# Patient Record
Sex: Male | Born: 1965 | Race: White | Hispanic: No | Marital: Married | Smoking: Never smoker
Health system: Southern US, Community
[De-identification: ages and names within clinical notes are randomized; demographics above are authoritative.]

## PROBLEM LIST (undated history)

## (undated) DIAGNOSIS — S4991XA Unspecified injury of right shoulder and upper arm, initial encounter: Secondary | ICD-10-CM

## (undated) DIAGNOSIS — T8859XA Other complications of anesthesia, initial encounter: Secondary | ICD-10-CM

## (undated) DIAGNOSIS — C801 Malignant (primary) neoplasm, unspecified: Secondary | ICD-10-CM

## (undated) DIAGNOSIS — K859 Acute pancreatitis without necrosis or infection, unspecified: Secondary | ICD-10-CM

## (undated) DIAGNOSIS — Z87442 Personal history of urinary calculi: Secondary | ICD-10-CM

## (undated) DIAGNOSIS — R058 Other specified cough: Secondary | ICD-10-CM

## (undated) DIAGNOSIS — E119 Type 2 diabetes mellitus without complications: Secondary | ICD-10-CM

## (undated) DIAGNOSIS — I2510 Atherosclerotic heart disease of native coronary artery without angina pectoris: Secondary | ICD-10-CM

## (undated) DIAGNOSIS — K219 Gastro-esophageal reflux disease without esophagitis: Secondary | ICD-10-CM

## (undated) DIAGNOSIS — N189 Chronic kidney disease, unspecified: Secondary | ICD-10-CM

## (undated) DIAGNOSIS — N529 Male erectile dysfunction, unspecified: Secondary | ICD-10-CM

## (undated) DIAGNOSIS — I10 Essential (primary) hypertension: Secondary | ICD-10-CM

## (undated) DIAGNOSIS — T4145XA Adverse effect of unspecified anesthetic, initial encounter: Secondary | ICD-10-CM

## (undated) DIAGNOSIS — G4730 Sleep apnea, unspecified: Secondary | ICD-10-CM

## (undated) DIAGNOSIS — D649 Anemia, unspecified: Secondary | ICD-10-CM

## (undated) DIAGNOSIS — K649 Unspecified hemorrhoids: Secondary | ICD-10-CM

## (undated) DIAGNOSIS — J45909 Unspecified asthma, uncomplicated: Secondary | ICD-10-CM

## (undated) DIAGNOSIS — N200 Calculus of kidney: Secondary | ICD-10-CM

## (undated) DIAGNOSIS — K760 Fatty (change of) liver, not elsewhere classified: Secondary | ICD-10-CM

## (undated) DIAGNOSIS — R05 Cough: Secondary | ICD-10-CM

## (undated) DIAGNOSIS — Z974 Presence of external hearing-aid: Secondary | ICD-10-CM

## (undated) DIAGNOSIS — K2270 Barrett's esophagus without dysplasia: Secondary | ICD-10-CM

## (undated) DIAGNOSIS — E785 Hyperlipidemia, unspecified: Secondary | ICD-10-CM

## (undated) DIAGNOSIS — K5790 Diverticulosis of intestine, part unspecified, without perforation or abscess without bleeding: Secondary | ICD-10-CM

## (undated) DIAGNOSIS — M1990 Unspecified osteoarthritis, unspecified site: Secondary | ICD-10-CM

## (undated) HISTORY — PX: COLONOSCOPY: SHX174

## (undated) HISTORY — PX: ANKLE GANGLION CYST EXCISION: SHX1148

## (undated) HISTORY — DX: Calculus of kidney: N20.0

---

## 1991-11-10 HISTORY — PX: PREPATELLAR BURSA EXCISION: SUR547

## 2000-11-09 HISTORY — PX: VASECTOMY: SHX75

## 2005-11-09 HISTORY — PX: HERNIA REPAIR: SHX51

## 2009-11-09 HISTORY — PX: SHOULDER SURGERY: SHX246

## 2010-11-09 DIAGNOSIS — J189 Pneumonia, unspecified organism: Secondary | ICD-10-CM

## 2010-11-09 HISTORY — DX: Pneumonia, unspecified organism: J18.9

## 2013-11-09 HISTORY — PX: KNEE ARTHROSCOPY: SUR90

## 2014-04-23 DIAGNOSIS — E781 Pure hyperglyceridemia: Secondary | ICD-10-CM

## 2014-04-23 DIAGNOSIS — E118 Type 2 diabetes mellitus with unspecified complications: Secondary | ICD-10-CM | POA: Insufficient documentation

## 2014-04-23 DIAGNOSIS — E1100 Type 2 diabetes mellitus with hyperosmolarity without nonketotic hyperglycemic-hyperosmolar coma (NKHHC): Secondary | ICD-10-CM

## 2014-04-23 DIAGNOSIS — E1169 Type 2 diabetes mellitus with other specified complication: Secondary | ICD-10-CM | POA: Insufficient documentation

## 2014-04-23 DIAGNOSIS — E785 Hyperlipidemia, unspecified: Secondary | ICD-10-CM | POA: Insufficient documentation

## 2014-05-01 DIAGNOSIS — M25519 Pain in unspecified shoulder: Secondary | ICD-10-CM

## 2014-05-10 ENCOUNTER — Ambulatory Visit: Payer: Self-pay | Admitting: Unknown Physician Specialty

## 2014-05-10 HISTORY — PX: COLONOSCOPY: SHX174

## 2014-07-24 DIAGNOSIS — B351 Tinea unguium: Secondary | ICD-10-CM | POA: Insufficient documentation

## 2014-08-30 DIAGNOSIS — M79642 Pain in left hand: Secondary | ICD-10-CM

## 2014-08-30 DIAGNOSIS — J3089 Other allergic rhinitis: Secondary | ICD-10-CM | POA: Insufficient documentation

## 2014-08-30 DIAGNOSIS — N529 Male erectile dysfunction, unspecified: Secondary | ICD-10-CM | POA: Insufficient documentation

## 2014-08-30 DIAGNOSIS — M25569 Pain in unspecified knee: Secondary | ICD-10-CM

## 2014-08-30 DIAGNOSIS — G8929 Other chronic pain: Secondary | ICD-10-CM | POA: Insufficient documentation

## 2014-08-30 DIAGNOSIS — M79641 Pain in right hand: Secondary | ICD-10-CM

## 2014-09-01 ENCOUNTER — Emergency Department: Payer: Self-pay | Admitting: Emergency Medicine

## 2014-09-01 LAB — COMPREHENSIVE METABOLIC PANEL
ALBUMIN: 3.7 g/dL (ref 3.4–5.0)
AST: 40 U/L — AB (ref 15–37)
Alkaline Phosphatase: 59 U/L
Anion Gap: 10 (ref 7–16)
BILIRUBIN TOTAL: 0.3 mg/dL (ref 0.2–1.0)
BUN: 17 mg/dL (ref 7–18)
CALCIUM: 8.7 mg/dL (ref 8.5–10.1)
CHLORIDE: 109 mmol/L — AB (ref 98–107)
CO2: 25 mmol/L (ref 21–32)
CREATININE: 1.4 mg/dL — AB (ref 0.60–1.30)
EGFR (African American): >60
GFR CALC NON AF AMER: 57 — AB
Glucose: 193 mg/dL — ABNORMAL HIGH (ref 65–99)
Osmolality: 294 (ref 275–301)
Potassium: 4 mmol/L (ref 3.5–5.1)
SGPT (ALT): 61 U/L
SODIUM: 144 mmol/L (ref 136–145)
Total Protein: 6.9 g/dL (ref 6.4–8.2)

## 2014-09-01 LAB — CBC
HCT: 40.1 % (ref 40.0–52.0)
HGB: 12.8 g/dL — AB (ref 13.0–18.0)
MCH: 29.4 pg (ref 26.0–34.0)
MCHC: 31.8 g/dL — AB (ref 32.0–36.0)
MCV: 92 fL (ref 80–100)
Platelet: 272 10*3/uL (ref 150–440)
RBC: 4.34 10*6/uL — ABNORMAL LOW (ref 4.40–5.90)
RDW: 14.3 % (ref 11.5–14.5)
WBC: 9.1 10*3/uL (ref 3.8–10.6)

## 2014-10-02 ENCOUNTER — Ambulatory Visit: Payer: Self-pay | Admitting: Orthopedic Surgery

## 2015-03-02 NOTE — Op Note (Signed)
PATIENT NAME:  Jesse Alvarado, Jesse Alvarado MR#:  [id number] DATE OF BIRTH:  1966/05/18  DATE OF PROCEDURE:  10/02/2014  PREOPERATIVE DIAGNOSIS: Right knee prepatellar bursa and Osgood-Schlatter syndrome.   POSTOPERATIVE DIAGNOSIS: Right knee prepatellar bursa and Osgood-Schlatter  syndrome.   PROCEDURE: Excision, right prepatellar bursa and ostectomy, Osgood-Schlatter lesion.   ANESTHESIA: General.   SURGEON: Hessie Knows, M.D.   DESCRIPTION OF PROCEDURE: The patient was brought to the operating room and after adequate anesthesia was obtained, the right leg was prepped and draped in the usual sterile fashion with a tourniquet applied to the upper thigh. After patient identification and timeout procedures were completed, the tourniquet was raised to 300 mmHg of the upper thigh. A midline skin incision was made over the patellar tendon and thick bursal tissue was identified and excised with sharp dissection using a scalpel. A rongeur was also used to debride this bursal tissue.  It did not appear infected, but it was inflamed. There was no significant fluid. It was thick bursal tissue present. Underlying the bursa, through the patellar tendon, a large spur was palpated consistent with Osgood-Schlatter. A midline tendon splitting incision was then made through the patellar tendon and the tendon elevated off the spur retaining medial and lateral attachments of the tendon.  After adequate exposure of the large spur, an osteotome was used to remove the largest portion of this.  Rongeur was also used to smooth the edges. After adequate resection of this bony prominence, the wound was irrigated.  Bone wax was applied to the proximal extent of the lesion, the raw bony to minimize postoperative bleeding. The distal portion was left intact to allow for tendon healing to the bone. The tendon was then repaired using a #1 Vicryl, 2-0 Vicryl subcutaneously and skin staples 10 mL of 0.5% Sensorcaine without epinephrine was  infiltrated postoperative analgesia. Xeroform, 4 x 4's, Webril and Ace wrap were applied. The tourniquet was let down.   TOURNIQUET TIME: 20 minutes at 300 mmHg.  COMPLICATIONS:  There were no complications.   SPECIMEN: No specimen.   ESTIMATED BLOOD LOSS: Minimal.   ____________________________ Laurene Footman, MD mjm:DT D: 10/02/2014 10:32:29 ET T: 10/02/2014 11:20:21 ET JOB#: [id number]  cc: Laurene Footman, MD, <Dictator> Laurene Footman MD ELECTRONICALLY SIGNED 10/02/2014 14:25

## 2015-09-14 DIAGNOSIS — D649 Anemia, unspecified: Secondary | ICD-10-CM

## 2015-09-14 DIAGNOSIS — G5603 Carpal tunnel syndrome, bilateral upper limbs: Secondary | ICD-10-CM | POA: Insufficient documentation

## 2015-09-14 DIAGNOSIS — I10 Essential (primary) hypertension: Secondary | ICD-10-CM | POA: Insufficient documentation

## 2015-11-20 DIAGNOSIS — M159 Polyosteoarthritis, unspecified: Secondary | ICD-10-CM | POA: Insufficient documentation

## 2015-11-20 DIAGNOSIS — M150 Primary generalized (osteo)arthritis: Secondary | ICD-10-CM

## 2015-12-26 ENCOUNTER — Encounter: Payer: Self-pay | Admitting: *Deleted

## 2015-12-27 ENCOUNTER — Encounter
Admission: RE | Disposition: A | Discharge status: Home / Self Care | Payer: Self-pay | Source: Ambulatory Visit | Attending: Unknown Physician Specialty

## 2015-12-27 ENCOUNTER — Encounter: Payer: Self-pay | Admitting: *Deleted

## 2015-12-27 ENCOUNTER — Ambulatory Visit: Payer: BLUE CROSS/BLUE SHIELD | Admitting: Anesthesiology

## 2015-12-27 ENCOUNTER — Ambulatory Visit
Admission: RE | Admit: 2015-12-27 | Discharge: 2015-12-27 | Disposition: A | Discharge status: Home / Self Care | Payer: BLUE CROSS/BLUE SHIELD | Source: Ambulatory Visit | Attending: Unknown Physician Specialty | Admitting: Unknown Physician Specialty

## 2015-12-27 DIAGNOSIS — Z79899 Other long term (current) drug therapy: Secondary | ICD-10-CM | POA: Insufficient documentation

## 2015-12-27 DIAGNOSIS — K2270 Barrett's esophagus without dysplasia: Secondary | ICD-10-CM | POA: Insufficient documentation

## 2015-12-27 DIAGNOSIS — Z7984 Long term (current) use of oral hypoglycemic drugs: Secondary | ICD-10-CM | POA: Insufficient documentation

## 2015-12-27 DIAGNOSIS — E119 Type 2 diabetes mellitus without complications: Secondary | ICD-10-CM | POA: Insufficient documentation

## 2015-12-27 DIAGNOSIS — N529 Male erectile dysfunction, unspecified: Secondary | ICD-10-CM | POA: Diagnosis not present

## 2015-12-27 DIAGNOSIS — K219 Gastro-esophageal reflux disease without esophagitis: Secondary | ICD-10-CM | POA: Diagnosis not present

## 2015-12-27 DIAGNOSIS — I10 Essential (primary) hypertension: Secondary | ICD-10-CM | POA: Diagnosis not present

## 2015-12-27 DIAGNOSIS — Z7951 Long term (current) use of inhaled steroids: Secondary | ICD-10-CM | POA: Diagnosis not present

## 2015-12-27 DIAGNOSIS — D509 Iron deficiency anemia, unspecified: Secondary | ICD-10-CM | POA: Insufficient documentation

## 2015-12-27 DIAGNOSIS — E785 Hyperlipidemia, unspecified: Secondary | ICD-10-CM | POA: Insufficient documentation

## 2015-12-27 DIAGNOSIS — K3189 Other diseases of stomach and duodenum: Secondary | ICD-10-CM | POA: Diagnosis not present

## 2015-12-27 DIAGNOSIS — K2950 Unspecified chronic gastritis without bleeding: Secondary | ICD-10-CM | POA: Insufficient documentation

## 2015-12-27 HISTORY — DX: Essential (primary) hypertension: I10

## 2015-12-27 HISTORY — DX: Male erectile dysfunction, unspecified: N52.9

## 2015-12-27 HISTORY — PX: ESOPHAGOGASTRODUODENOSCOPY (EGD) WITH PROPOFOL: SHX5813

## 2015-12-27 HISTORY — DX: Type 2 diabetes mellitus without complications: E11.9

## 2015-12-27 HISTORY — DX: Gastro-esophageal reflux disease without esophagitis: K21.9

## 2015-12-27 HISTORY — DX: Hyperlipidemia, unspecified: E78.5

## 2015-12-27 LAB — GLUCOSE, CAPILLARY: Glucose-Capillary: 107 mg/dL — ABNORMAL HIGH (ref 65–99)

## 2015-12-27 SURGERY — ESOPHAGOGASTRODUODENOSCOPY (EGD) WITH PROPOFOL
Anesthesia: General

## 2015-12-27 MED ORDER — PROPOFOL 10 MG/ML IV BOLUS
INTRAVENOUS | Status: DC | PRN
  Administered 2015-12-27: 100 mg via INTRAVENOUS
  Administered 2015-12-27: 20 mg via INTRAVENOUS
  Administered 2015-12-27: 10 mg via INTRAVENOUS

## 2015-12-27 MED ORDER — GLYCOPYRROLATE 0.2 MG/ML IJ SOLN
INTRAMUSCULAR | Status: DC | PRN
  Administered 2015-12-27: 0.2 mg via INTRAVENOUS

## 2015-12-27 MED ORDER — MIDAZOLAM HCL 2 MG/2ML IJ SOLN
INTRAMUSCULAR | Status: DC | PRN
  Administered 2015-12-27: 2 mg via INTRAVENOUS

## 2015-12-27 MED ORDER — SODIUM CHLORIDE 0.9 % IV SOLN
INTRAVENOUS | Status: DC
  Administered 2015-12-27: 07:00:00 via INTRAVENOUS

## 2015-12-27 MED ORDER — SODIUM CHLORIDE 0.9 % IV SOLN: INTRAVENOUS | Status: DC

## 2015-12-27 NOTE — Anesthesia Preprocedure Evaluation (Signed)
Anesthesia Evaluation  Patient identified by MRN, date of birth, ID band Patient awake    Reviewed: Allergy & Precautions, NPO status , Patient's Chart, lab work & pertinent test results  Airway Mallampati: II       Dental  (+) Teeth Intact   Pulmonary neg pulmonary ROS,    breath sounds clear to auscultation       Cardiovascular Exercise Tolerance: Good hypertension, Pt. on medications  Rhythm:Regular     Neuro/Psych    GI/Hepatic Neg liver ROS, GERD  Medicated,  Endo/Other  diabetes, Well Controlled, Type 2, Oral Hypoglycemic Agents  Renal/GU      Musculoskeletal   Abdominal Normal abdominal exam  (+)   Peds  Hematology   Anesthesia Other Findings   Reproductive/Obstetrics                             Anesthesia Physical Anesthesia Plan  ASA: II  Anesthesia Plan: General   Post-op Pain Management:    Induction: Intravenous  Airway Management Planned: Natural Airway and Nasal Cannula  Additional Equipment:   Intra-op Plan:   Post-operative Plan:   Informed Consent: I have reviewed the patients History and Physical, chart, labs and discussed the procedure including the risks, benefits and alternatives for the proposed anesthesia with the patient or authorized representative who has indicated his/her understanding and acceptance.     Plan Discussed with: CRNA  Anesthesia Plan Comments:         Anesthesia Quick Evaluation

## 2015-12-27 NOTE — Transfer of Care (Signed)
Immediate Anesthesia Transfer of Care Note  Patient: Jesse Alvarado  Procedure(s) Performed: Procedure(s): ESOPHAGOGASTRODUODENOSCOPY (EGD) WITH PROPOFOL (N/A)  Patient Location: PACU  Anesthesia Type:General  Level of Consciousness: awake  Airway & Oxygen Therapy: Patient Spontanous Breathing  Post-op Assessment: Report given to RN  Post vital signs: Reviewed and stable  Last Vitals:  Filed Vitals:   12/27/15 0656  BP: 145/88  Temp: 36.2 C  Resp: 17    Complications: No apparent anesthesia complications

## 2015-12-27 NOTE — Op Note (Signed)
[facility] Gastroenterology Patient Name: Jesse Alvarado Procedure Date: 12/27/2015 7:20 AM MRN: [id number] Account #: [id number] Date of Birth: 03/28/1966 Admit Type: Outpatient Age: 50 Room: [facility] ENDO ROOM 1 Gender: Male Note Status: Finalized Procedure:            Upper GI endoscopy Indications:          Unexplained iron deficiency anemia, Heartburn Providers:            Manya Silvas, MD Referring MD:         Glendon Axe (Referring MD) Medicines:            Propofol per Anesthesia Complications:        No immediate complications. Procedure:            Pre-Anesthesia Assessment:                       - After reviewing the risks and benefits, the patient                        was deemed in satisfactory condition to undergo the                        procedure.                       After obtaining informed consent, the endoscope was                        passed under direct vision. Throughout the procedure,                        the patient's blood pressure, pulse, and oxygen                        saturations were monitored continuously. The Endoscope                        was introduced through the mouth, and advanced to the                        second part of duodenum. The upper GI endoscopy was                        accomplished without difficulty. The patient tolerated                        the procedure well. Findings:      There were esophageal mucosal changes suspicious for short-segment       Barrett's esophagus present at the gastroesophageal junction. Irregular       Z line seen. The maximum longitudinal extent of these mucosal changes       was 1 cm in length. Mucosa was biopsied with a cold forceps for       histology. One specimen bottle was sent to pathology. GEJ at 40cm.      Patchy mildly erythematous mucosa without bleeding was found in the       gastric body. Biopsies were taken with a cold forceps for histology.   Biopsies were taken with a cold forceps for Helicobacter pylori testing.      Mildly erythematous mucosa without active bleeding and with  no stigmata       of bleeding was found in the duodenal bulb. Biopsy done of second       portion to check for celiac disease given the iron def anemia and       diabetes. Impression:           - Esophageal mucosal changes suspicious for                        short-segment Barrett's esophagus. Biopsied.                       - Erythematous mucosa in the gastric body. Biopsied.                       - Erythematous duodenopathy. Recommendation:       - Await pathology results. Manya Silvas, MD 12/27/2015 7:52:58 AM This report has been signed electronically. Number of Addenda: 0 Note Initiated On: 12/27/2015 7:20 AM      [facility]

## 2015-12-27 NOTE — Anesthesia Postprocedure Evaluation (Signed)
Anesthesia Post Note  Patient: Jesse Alvarado  Procedure(s) Performed: Procedure(s) (LRB): ESOPHAGOGASTRODUODENOSCOPY (EGD) WITH PROPOFOL (N/A)  Patient location during evaluation: PACU Anesthesia Type: General Level of consciousness: awake Pain management: pain level controlled Vital Signs Assessment: post-procedure vital signs reviewed and stable Respiratory status: spontaneous breathing Cardiovascular status: stable Anesthetic complications: no    Last Vitals:  Filed Vitals:   12/27/15 0752 12/27/15 0802  BP: 117/83 114/87  Pulse: 98 96  Temp: 36.4 C   Resp: 22 19    Last Pain: There were no vitals filed for this visit.               VAN STAVEREN,Mishael Krysiak

## 2015-12-27 NOTE — H&P (Signed)
Primary Care Physician:  Glendon Axe, MD Primary Gastroenterologist:  Dr. Vira Agar  Pre-Procedure History & Physical: HPI:  Jesse Alvarado is a 50 y.o. male is here for an endoscopy.   Past Medical History  Diagnosis Date  . Diabetes mellitus without complication ([place])   . GERD (gastroesophageal reflux disease)   . Hypertension   . Hyperlipidemia   . Erectile dysfunction     Past Surgical History  Procedure Laterality Date  . Shoulder surgery Right   . Hernia repair    . Prepatellar bursa excision      and ostetomy    Prior to Admission medications   Medication Sig Start Date End Date Taking? Authorizing Provider  ciclopirox (PENLAC) 8 % solution Apply topically at bedtime. Apply over nail and surrounding skin. Apply daily over previous coat. After seven (7) days, may remove with alcohol and continue cycle.   Yes Historical Provider, MD  fenofibrate (TRICOR) 145 MG tablet Take 145 mg by mouth daily.   Yes Historical Provider, MD  ferrous fumarate (HEMOCYTE - 106 MG FE) 325 (106 Fe) MG TABS tablet Take 1 tablet by mouth 3 (three) times daily with meals.   Yes Historical Provider, MD  fluticasone (FLONASE) 50 MCG/ACT nasal spray Place 2 sprays into both nostrils daily.   Yes Historical Provider, MD  glimepiride (AMARYL) 2 MG tablet Take 2 mg by mouth daily with breakfast.   Yes Historical Provider, MD  HYDROcodone-homatropine (HYCODAN) 5-1.5 MG/5ML syrup Take 5 mLs by mouth every 6 (six) hours as needed for cough.   Yes Historical Provider, MD  losartan (COZAAR) 25 MG tablet Take 25 mg by mouth daily.   Yes Historical Provider, MD  meloxicam (MOBIC) 15 MG tablet Take 15 mg by mouth daily.   Yes Historical Provider, MD  metFORMIN (GLUCOPHAGE) 850 MG tablet Take 850 mg by mouth 3 (three) times daily.   Yes Historical Provider, MD  montelukast (SINGULAIR) 10 MG tablet Take 10 mg by mouth at bedtime.   Yes Historical Provider, MD  pantoprazole (PROTONIX) 40 MG tablet Take 40 mg by  mouth daily.   Yes Historical Provider, MD  sildenafil (VIAGRA) 100 MG tablet Take 100 mg by mouth daily as needed for erectile dysfunction.   Yes Historical Provider, MD    Allergies as of 12/24/2015  . (Not on File)    History reviewed. No pertinent family history.  Social History   Social History  . Marital Status: Married    Spouse Name: N/A  . Number of Children: N/A  . Years of Education: N/A   Occupational History  . Not on file.   Social History Main Topics  . Smoking status: Never Smoker   . Smokeless tobacco: Never Used  . Alcohol Use: No  . Drug Use: No  . Sexual Activity: Not on file   Other Topics Concern  . Not on file   Social History Narrative    Review of Systems: See HPI, otherwise negative ROS  Physical Exam: BP 145/88 mmHg  Temp(Src) 97.1 F (36.2 C) (Tympanic)  Resp 17  Ht 5\' 9"  (1.753 m)  Wt 83.462 kg (184 lb)  BMI 27.16 kg/m2  SpO2 99% General:   Alert,  pleasant and cooperative in NAD Head:  Normocephalic and atraumatic. Neck:  Supple; no masses or thyromegaly. Lungs:  Clear throughout to auscultation.    Heart:  Regular rate and rhythm. Abdomen:  Soft, nontender and nondistended. Normal bowel sounds, without guarding, and without rebound.  Neurologic:  Alert and  oriented x4;  grossly normal neurologically.  Impression/Plan: Jesse Alvarado is here for an endoscopy to be performed for iron def anemia and GERD  Risks, benefits, limitations, and alternatives regarding  endoscopy have been reviewed with the patient.  Questions have been answered.  All parties agreeable.   Gaylyn Cheers, MD  12/27/2015, 7:36 AM

## 2015-12-30 ENCOUNTER — Encounter: Payer: Self-pay | Admitting: Unknown Physician Specialty

## 2015-12-30 LAB — SURGICAL PATHOLOGY

## 2016-01-25 DIAGNOSIS — M545 Low back pain, unspecified: Secondary | ICD-10-CM | POA: Insufficient documentation

## 2016-01-25 DIAGNOSIS — G8929 Other chronic pain: Secondary | ICD-10-CM | POA: Insufficient documentation

## 2016-03-04 ENCOUNTER — Ambulatory Visit: Payer: BLUE CROSS/BLUE SHIELD | Attending: Otolaryngology

## 2016-03-04 DIAGNOSIS — G4733 Obstructive sleep apnea (adult) (pediatric): Secondary | ICD-10-CM | POA: Diagnosis present

## 2016-03-04 DIAGNOSIS — R0683 Snoring: Secondary | ICD-10-CM | POA: Insufficient documentation

## 2016-03-12 ENCOUNTER — Ambulatory Visit: Payer: BLUE CROSS/BLUE SHIELD | Attending: Otolaryngology

## 2016-03-12 DIAGNOSIS — G4733 Obstructive sleep apnea (adult) (pediatric): Secondary | ICD-10-CM | POA: Insufficient documentation

## 2016-04-13 ENCOUNTER — Encounter
Admission: EM | Disposition: A | Discharge status: Home / Self Care | Payer: Self-pay | Source: Home / Self Care | Attending: Internal Medicine

## 2016-04-13 ENCOUNTER — Emergency Department: Payer: BLUE CROSS/BLUE SHIELD

## 2016-04-13 ENCOUNTER — Inpatient Hospital Stay: Payer: BLUE CROSS/BLUE SHIELD | Admitting: Anesthesiology

## 2016-04-13 ENCOUNTER — Inpatient Hospital Stay
Admission: EM | Admit: 2016-04-13 | Discharge: 2016-04-14 | DRG: 669 | Disposition: A | Discharge status: Home / Self Care | Payer: BLUE CROSS/BLUE SHIELD | Attending: Internal Medicine | Admitting: Internal Medicine

## 2016-04-13 ENCOUNTER — Encounter: Payer: Self-pay | Admitting: *Deleted

## 2016-04-13 DIAGNOSIS — R1032 Left lower quadrant pain: Secondary | ICD-10-CM | POA: Diagnosis not present

## 2016-04-13 DIAGNOSIS — Z7951 Long term (current) use of inhaled steroids: Secondary | ICD-10-CM | POA: Diagnosis not present

## 2016-04-13 DIAGNOSIS — E119 Type 2 diabetes mellitus without complications: Secondary | ICD-10-CM | POA: Diagnosis present

## 2016-04-13 DIAGNOSIS — N132 Hydronephrosis with renal and ureteral calculous obstruction: Secondary | ICD-10-CM | POA: Diagnosis present

## 2016-04-13 DIAGNOSIS — Z9889 Other specified postprocedural states: Secondary | ICD-10-CM

## 2016-04-13 DIAGNOSIS — N202 Calculus of kidney with calculus of ureter: Secondary | ICD-10-CM | POA: Diagnosis present

## 2016-04-13 DIAGNOSIS — K219 Gastro-esophageal reflux disease without esophagitis: Secondary | ICD-10-CM | POA: Diagnosis present

## 2016-04-13 DIAGNOSIS — E785 Hyperlipidemia, unspecified: Secondary | ICD-10-CM | POA: Diagnosis present

## 2016-04-13 DIAGNOSIS — N201 Calculus of ureter: Secondary | ICD-10-CM

## 2016-04-13 DIAGNOSIS — Z888 Allergy status to other drugs, medicaments and biological substances status: Secondary | ICD-10-CM

## 2016-04-13 DIAGNOSIS — I10 Essential (primary) hypertension: Secondary | ICD-10-CM | POA: Diagnosis present

## 2016-04-13 DIAGNOSIS — N529 Male erectile dysfunction, unspecified: Secondary | ICD-10-CM | POA: Diagnosis present

## 2016-04-13 DIAGNOSIS — N179 Acute kidney failure, unspecified: Secondary | ICD-10-CM | POA: Diagnosis present

## 2016-04-13 DIAGNOSIS — N200 Calculus of kidney: Secondary | ICD-10-CM | POA: Diagnosis present

## 2016-04-13 DIAGNOSIS — Z79899 Other long term (current) drug therapy: Secondary | ICD-10-CM

## 2016-04-13 DIAGNOSIS — Z87442 Personal history of urinary calculi: Secondary | ICD-10-CM

## 2016-04-13 DIAGNOSIS — R52 Pain, unspecified: Secondary | ICD-10-CM

## 2016-04-13 DIAGNOSIS — R112 Nausea with vomiting, unspecified: Secondary | ICD-10-CM | POA: Diagnosis not present

## 2016-04-13 HISTORY — DX: Anemia, unspecified: D64.9

## 2016-04-13 HISTORY — DX: Chronic kidney disease, unspecified: N18.9

## 2016-04-13 HISTORY — DX: Unspecified asthma, uncomplicated: J45.909

## 2016-04-13 HISTORY — PX: CYSTOSCOPY/URETEROSCOPY/HOLMIUM LASER/STENT PLACEMENT: SHX6546

## 2016-04-13 LAB — CBC
HCT: 40.5 % (ref 40.0–52.0)
Hemoglobin: 13.7 g/dL (ref 13.0–18.0)
MCH: 29.4 pg (ref 26.0–34.0)
MCHC: 33.9 g/dL (ref 32.0–36.0)
MCV: 86.9 fL (ref 80.0–100.0)
PLATELETS: 367 10*3/uL (ref 150–440)
RBC: 4.66 MIL/uL (ref 4.40–5.90)
RDW: 14 % (ref 11.5–14.5)
WBC: 10.9 10*3/uL — AB (ref 3.8–10.6)

## 2016-04-13 LAB — URINALYSIS COMPLETE WITH MICROSCOPIC (ARMC ONLY)
Bilirubin Urine: NEGATIVE
Glucose, UA: NEGATIVE mg/dL
Ketones, ur: NEGATIVE mg/dL
Leukocytes, UA: NEGATIVE
Nitrite: NEGATIVE
Protein, ur: 30 mg/dL — AB
Specific Gravity, Urine: 1.017 (ref 1.005–1.030)
pH: 5 (ref 5.0–8.0)

## 2016-04-13 LAB — BASIC METABOLIC PANEL
Anion gap: 10 (ref 5–15)
BUN: 19 mg/dL (ref 6–20)
CALCIUM: 9.8 mg/dL (ref 8.9–10.3)
CO2: 22 mmol/L (ref 22–32)
CREATININE: 1.27 mg/dL — AB (ref 0.61–1.24)
Chloride: 107 mmol/L (ref 101–111)
GFR calc Af Amer: >60 mL/min (ref >60)
Glucose, Bld: 175 mg/dL — ABNORMAL HIGH (ref 65–99)
Potassium: 4.1 mmol/L (ref 3.5–5.1)
SODIUM: 139 mmol/L (ref 135–145)

## 2016-04-13 LAB — GLUCOSE, CAPILLARY
Glucose-Capillary: 156 mg/dL — ABNORMAL HIGH (ref 65–99)
Glucose-Capillary: 111 mg/dL — ABNORMAL HIGH (ref 65–99)
Glucose-Capillary: 129 mg/dL — ABNORMAL HIGH (ref 65–99)
Glucose-Capillary: 131 mg/dL — ABNORMAL HIGH (ref 65–99)
Glucose-Capillary: 122 mg/dL — ABNORMAL HIGH (ref 65–99)
Glucose-Capillary: 177 mg/dL — ABNORMAL HIGH (ref 65–99)

## 2016-04-13 LAB — HEMOGLOBIN A1C: Hgb A1c MFr Bld: 7.6 % — ABNORMAL HIGH (ref 4.0–6.0)

## 2016-04-13 LAB — TSH: TSH: 3.098 u[IU]/mL (ref 0.350–4.500)

## 2016-04-13 SURGERY — CYSTOSCOPY/URETEROSCOPY/HOLMIUM LASER/STENT PLACEMENT
Anesthesia: General | Laterality: Left | Wound class: Clean Contaminated

## 2016-04-13 MED ORDER — CEFAZOLIN SODIUM-DEXTROSE 2-4 GM/100ML-% IV SOLN
2.0000 g | Freq: Once | INTRAVENOUS | Status: DC
  Filled 2016-04-13: qty 100

## 2016-04-13 MED ORDER — PROPOFOL 10 MG/ML IV BOLUS
INTRAVENOUS | Status: DC | PRN
  Administered 2016-04-13: 170 mg via INTRAVENOUS
  Administered 2016-04-13: 20 mg via INTRAVENOUS

## 2016-04-13 MED ORDER — TIZANIDINE HCL 4 MG PO TABS: 2.0000 mg | ORAL_TABLET | Freq: Every evening | ORAL | Status: DC | PRN

## 2016-04-13 MED ORDER — INSULIN ASPART 100 UNIT/ML ~~LOC~~ SOLN
0.0000 [IU] | Freq: Three times a day (TID) | SUBCUTANEOUS | Status: DC
  Administered 2016-04-13 (×2): 2 [IU] via SUBCUTANEOUS
  Administered 2016-04-13: 3 [IU] via SUBCUTANEOUS
  Filled 2016-04-13 (×2): qty 2
  Filled 2016-04-13: qty 3

## 2016-04-13 MED ORDER — ONDANSETRON HCL 4 MG/2ML IJ SOLN: 4.0000 mg | Freq: Four times a day (QID) | INTRAMUSCULAR | Status: DC | PRN

## 2016-04-13 MED ORDER — PHENYLEPHRINE HCL 10 MG/ML IJ SOLN
INTRAMUSCULAR | Status: DC | PRN
  Administered 2016-04-13 (×5): 100 ug via INTRAVENOUS

## 2016-04-13 MED ORDER — HYDROMORPHONE HCL 1 MG/ML IJ SOLN
1.0000 mg | INTRAMUSCULAR | Status: DC | PRN
  Administered 2016-04-13 (×3): 1 mg via INTRAVENOUS
  Filled 2016-04-13 (×3): qty 1

## 2016-04-13 MED ORDER — HYDROMORPHONE HCL 1 MG/ML IJ SOLN
INTRAMUSCULAR | Status: AC
  Filled 2016-04-13: qty 1

## 2016-04-13 MED ORDER — HYDROMORPHONE HCL 1 MG/ML IJ SOLN
1.0000 mg | Freq: Once | INTRAMUSCULAR | Status: AC
  Administered 2016-04-13: 1 mg via INTRAVENOUS
  Filled 2016-04-13: qty 1

## 2016-04-13 MED ORDER — SILDENAFIL CITRATE 100 MG PO TABS: 100.0000 mg | ORAL_TABLET | Freq: Every day | ORAL | Status: DC | PRN

## 2016-04-13 MED ORDER — BENZONATATE 100 MG PO CAPS: 200.0000 mg | ORAL_CAPSULE | Freq: Three times a day (TID) | ORAL | Status: DC | PRN

## 2016-04-13 MED ORDER — INSULIN ASPART 100 UNIT/ML ~~LOC~~ SOLN: 0.0000 [IU] | Freq: Every day | SUBCUTANEOUS | Status: DC

## 2016-04-13 MED ORDER — DOCUSATE SODIUM 100 MG PO CAPS
100.0000 mg | ORAL_CAPSULE | Freq: Two times a day (BID) | ORAL | Status: DC
  Administered 2016-04-13 – 2016-04-14 (×3): 100 mg via ORAL
  Filled 2016-04-13 (×3): qty 1

## 2016-04-13 MED ORDER — ONDANSETRON HCL 4 MG PO TABS: 4.0000 mg | ORAL_TABLET | Freq: Four times a day (QID) | ORAL | Status: DC | PRN

## 2016-04-13 MED ORDER — IOTHALAMATE MEGLUMINE 43 % IV SOLN
INTRAVENOUS | Status: DC | PRN
  Administered 2016-04-13: 20 mL

## 2016-04-13 MED ORDER — HYDROCODONE-ACETAMINOPHEN 5-325 MG PO TABS
1.0000 | ORAL_TABLET | Freq: Four times a day (QID) | ORAL | Status: DC | PRN
Start: 2016-04-13 — End: 2016-04-14
  Administered 2016-04-13 (×2): 1 via ORAL
  Filled 2016-04-13 (×3): qty 1

## 2016-04-13 MED ORDER — TAMSULOSIN HCL 0.4 MG PO CAPS
0.4000 mg | ORAL_CAPSULE | Freq: Once | ORAL | Status: AC
  Administered 2016-04-13: 0.4 mg via ORAL
  Filled 2016-04-13: qty 1

## 2016-04-13 MED ORDER — VITAMIN C 500 MG PO TABS
500.0000 mg | ORAL_TABLET | Freq: Every day | ORAL | Status: DC
  Administered 2016-04-13 – 2016-04-14 (×2): 500 mg via ORAL
  Filled 2016-04-13 (×2): qty 1

## 2016-04-13 MED ORDER — ONDANSETRON HCL 4 MG/2ML IJ SOLN
4.0000 mg | Freq: Once | INTRAMUSCULAR | Status: AC
  Administered 2016-04-13: 4 mg via INTRAVENOUS
  Filled 2016-04-13: qty 2

## 2016-04-13 MED ORDER — FENOFIBRATE 160 MG PO TABS
160.0000 mg | ORAL_TABLET | Freq: Every day | ORAL | Status: DC
  Administered 2016-04-14: 160 mg via ORAL
  Filled 2016-04-13 (×3): qty 1

## 2016-04-13 MED ORDER — PANTOPRAZOLE SODIUM 40 MG PO TBEC
40.0000 mg | DELAYED_RELEASE_TABLET | Freq: Two times a day (BID) | ORAL | Status: DC
  Administered 2016-04-13 – 2016-04-14 (×3): 40 mg via ORAL
  Filled 2016-04-13 (×3): qty 1

## 2016-04-13 MED ORDER — FENTANYL CITRATE (PF) 100 MCG/2ML IJ SOLN
INTRAMUSCULAR | Status: DC | PRN
  Administered 2016-04-13: 25 ug via INTRAVENOUS
  Administered 2016-04-13: 50 ug via INTRAVENOUS
  Administered 2016-04-13: 25 ug via INTRAVENOUS

## 2016-04-13 MED ORDER — HYDROMORPHONE HCL 1 MG/ML IJ SOLN
1.0000 mg | Freq: Once | INTRAMUSCULAR | Status: AC
  Administered 2016-04-13: 1 mg via INTRAVENOUS

## 2016-04-13 MED ORDER — ONDANSETRON HCL 4 MG/2ML IJ SOLN: 4.0000 mg | Freq: Once | INTRAMUSCULAR | Status: DC | PRN

## 2016-04-13 MED ORDER — FENTANYL CITRATE (PF) 100 MCG/2ML IJ SOLN: 25.0000 ug | INTRAMUSCULAR | Status: DC | PRN

## 2016-04-13 MED ORDER — FLUTICASONE PROPIONATE 50 MCG/ACT NA SUSP
2.0000 | Freq: Every day | NASAL | Status: DC | PRN
  Filled 2016-04-13: qty 16

## 2016-04-13 MED ORDER — SODIUM CHLORIDE 0.9 % IV SOLN
INTRAVENOUS | Status: DC
Start: 2016-04-13 — End: 2016-04-14
  Administered 2016-04-13 – 2016-04-14 (×4): via INTRAVENOUS

## 2016-04-13 MED ORDER — LORATADINE 10 MG PO TABS
10.0000 mg | ORAL_TABLET | Freq: Every day | ORAL | Status: DC
  Administered 2016-04-13: 10 mg via ORAL
  Filled 2016-04-13: qty 1

## 2016-04-13 MED ORDER — MIDAZOLAM HCL 2 MG/2ML IJ SOLN
INTRAMUSCULAR | Status: DC | PRN
  Administered 2016-04-13: 2 mg via INTRAVENOUS

## 2016-04-13 MED ORDER — TRAMADOL HCL 50 MG PO TABS
50.0000 mg | ORAL_TABLET | Freq: Four times a day (QID) | ORAL | Status: DC | PRN
  Administered 2016-04-13: 50 mg via ORAL
  Filled 2016-04-13: qty 1

## 2016-04-13 MED ORDER — CEFAZOLIN SODIUM-DEXTROSE 2-4 GM/100ML-% IV SOLN
2.0000 g | INTRAVENOUS | Status: AC
  Administered 2016-04-13: 2 g via INTRAVENOUS
  Filled 2016-04-13: qty 100

## 2016-04-13 MED ORDER — ACETAMINOPHEN 650 MG RE SUPP: 650.0000 mg | Freq: Four times a day (QID) | RECTAL | Status: DC | PRN

## 2016-04-13 MED ORDER — ADULT MULTIVITAMIN W/MINERALS CH
1.0000 | ORAL_TABLET | Freq: Every day | ORAL | Status: DC
  Administered 2016-04-13 – 2016-04-14 (×2): 1 via ORAL
  Filled 2016-04-13 (×2): qty 1

## 2016-04-13 MED ORDER — LOSARTAN POTASSIUM 25 MG PO TABS
25.0000 mg | ORAL_TABLET | Freq: Every day | ORAL | Status: DC
  Administered 2016-04-13 – 2016-04-14 (×2): 25 mg via ORAL
  Filled 2016-04-13 (×2): qty 1

## 2016-04-13 MED ORDER — FERROUS GLUCONATE 324 (38 FE) MG PO TABS
324.0000 mg | ORAL_TABLET | Freq: Three times a day (TID) | ORAL | Status: DC
  Administered 2016-04-13 – 2016-04-14 (×4): 324 mg via ORAL
  Filled 2016-04-13 (×3): qty 1

## 2016-04-13 MED ORDER — ACETAMINOPHEN 325 MG PO TABS: 650.0000 mg | ORAL_TABLET | Freq: Four times a day (QID) | ORAL | Status: DC | PRN

## 2016-04-13 MED ORDER — LIDOCAINE HCL (CARDIAC) 20 MG/ML IV SOLN
INTRAVENOUS | Status: DC | PRN
  Administered 2016-04-13: 80 mg via INTRAVENOUS

## 2016-04-13 MED ORDER — SODIUM CHLORIDE 0.9 % IV BOLUS (SEPSIS)
1000.0000 mL | Freq: Once | INTRAVENOUS | Status: AC
  Administered 2016-04-13: 1000 mL via INTRAVENOUS

## 2016-04-13 SURGICAL SUPPLY — 30 items
ADAPTER SCOPE UROLOK II (MISCELLANEOUS) ×3 IMPLANT
BAG DRAIN CYSTO-URO LG1000N (MISCELLANEOUS) ×3 IMPLANT
BASKET 3WIRE GEMINI 2.4X120 (MISCELLANEOUS) IMPLANT
BASKET LASER NITINOL 1.9FR (BASKET) ×3 IMPLANT
BASKET ZERO TIP 1.9FR (BASKET) IMPLANT
CATH URETL 5X70 OPEN END (CATHETERS) ×3 IMPLANT
CATH URETL OPEN END 6X70 (CATHETERS) ×3 IMPLANT
CNTNR SPEC 2.5X3XGRAD LEK (MISCELLANEOUS) ×1
CONT SPEC 4OZ STER OR WHT (MISCELLANEOUS) ×2
CONTAINER SPEC 2.5X3XGRAD LEK (MISCELLANEOUS) ×1 IMPLANT
FEE TECHNICIAN ONLY PER HOUR (MISCELLANEOUS) IMPLANT
GLOVE BIO SURGEON STRL SZ7.5 (GLOVE) ×3 IMPLANT
GOWN STRL REUS W/ TWL LRG LVL4 (GOWN DISPOSABLE) ×2 IMPLANT
GOWN STRL REUS W/TWL LRG LVL4 (GOWN DISPOSABLE) ×4
INTRODUCER DILATOR DOUBLE (INTRODUCER) IMPLANT
KIT INTRODUCER PERC 15FR PTFE (INTRODUCER) ×3 IMPLANT
LASER FIBER 200M SMARTSCOPE (Laser) ×3 IMPLANT
PACK CYSTO AR (MISCELLANEOUS) ×3 IMPLANT
PREP PVP WINGED SPONGE (MISCELLANEOUS) IMPLANT
SENSORWIRE 0.038 NOT ANGLED (WIRE) ×3
SET CYSTO W/LG BORE CLAMP LF (SET/KITS/TRAYS/PACK) ×3 IMPLANT
SHEATH URETERAL 12FRX35CM (MISCELLANEOUS) IMPLANT
SOL .9 NS 3000ML IRR  AL (IV SOLUTION) ×2
SOL .9 NS 3000ML IRR UROMATIC (IV SOLUTION) ×1 IMPLANT
SOL PREP PVP 2OZ (MISCELLANEOUS) ×3
SOLUTION PREP PVP 2OZ (MISCELLANEOUS) ×1 IMPLANT
STENT URET 6FRX26 CONTOUR (STENTS) ×3 IMPLANT
SURGILUBE 2OZ TUBE FLIPTOP (MISCELLANEOUS) ×3 IMPLANT
SYRINGE IRR TOOMEY STRL 70CC (SYRINGE) IMPLANT
WIRE SENSOR 0.038 NOT ANGLED (WIRE) ×1 IMPLANT

## 2016-04-13 NOTE — ED Notes (Signed)
Pt. States having hx of kidney stones.  Pt. States he first had pain in lower right abdominal area.  Pt. States at [place] today, pt. Tried to urinate at home and the pain became unbearable.  Pt. States he was only able to urinate a small amount.  Pt. States urine was very dark.

## 2016-04-13 NOTE — Anesthesia Procedure Notes (Addendum)
Procedure Name: LMA Insertion Performed by: Vicie Cech Pre-anesthesia Checklist: Patient identified, Emergency Drugs available, Suction available, Patient being monitored and Timeout performed Patient Re-evaluated:Patient Re-evaluated prior to inductionOxygen Delivery Method: Circle system utilized Preoxygenation: Pre-oxygenation with 100% oxygen Intubation Type: IV induction LMA: LMA inserted LMA Size: 4.0 Number of attempts: 1 Placement Confirmation: positive ETCO2 and breath sounds checked- equal and bilateral Tube secured with: Tape Dental Injury: Teeth and Oropharynx as per pre-operative assessment      

## 2016-04-13 NOTE — Anesthesia Preprocedure Evaluation (Addendum)
Anesthesia Evaluation  Patient identified by MRN, date of birth, ID band Patient awake    Reviewed: Allergy & Precautions, H&P , NPO status , Patient's Chart, lab work & pertinent test results, reviewed documented beta blocker date and time   Airway Mallampati: II   Neck ROM: full    Dental  (+) Poor Dentition   Pulmonary neg pulmonary ROS,    Pulmonary exam normal        Cardiovascular hypertension, negative cardio ROS Normal cardiovascular exam     Neuro/Psych negative neurological ROS  negative psych ROS   GI/Hepatic negative GI ROS, Neg liver ROS, GERD  Medicated,  Endo/Other  negative endocrine ROSdiabetes  Renal/GU Renal diseasenegative Renal ROS  negative genitourinary   Musculoskeletal   Abdominal   Peds  Hematology negative hematology ROS (+)   Anesthesia Other Findings Past Medical History:   Diabetes mellitus without complication (HCC)                 GERD (gastroesophageal reflux disease)                       Hypertension                                                 Hyperlipidemia                                               Erectile dysfunction                                       Past Surgical History:   SHOULDER SURGERY                                Right              HERNIA REPAIR                                                 PREPATELLAR BURSA EXCISION                                      Comment:and ostetomy   ESOPHAGOGASTRODUODENOSCOPY (EGD) WITH PROPOFOL  N/A 12/27/2015      Comment:Procedure: ESOPHAGOGASTRODUODENOSCOPY (EGD)               WITH PROPOFOL;  Surgeon: Manya Silvas, MD;               Location[facility] ENDOSCOPY;  Service: Endoscopy;               Laterality: N/A; BMI    Body Mass Index   27.30 kg/m 2     Reproductive/Obstetrics                            Anesthesia Physical Anesthesia Plan  ASA: III  Anesthesia Plan: General   Post-op  Pain Management:    Induction:   Airway Management Planned:   Additional Equipment:   Intra-op Plan:   Post-operative Plan:   Informed Consent: I have reviewed the patients History and Physical, chart, labs and discussed the procedure including the risks, benefits and alternatives for the proposed anesthesia with the patient or authorized representative who has indicated his/her understanding and acceptance.   Dental Advisory Given  Plan Discussed with: CRNA  Anesthesia Plan Comments:         Anesthesia Quick Evaluation

## 2016-04-13 NOTE — Progress Notes (Signed)
Pharmacy Antibiotic Note  Jesse Alvarado is a 50 y.o. male admitted on 04/13/2016 with surgical prophylaxis.  Pharmacy has been consulted for cefazolin dosing.  Plan: Will order Cefazolin 2 gm IV once on call to OR.  Antibiotics to be administered 60 minutes prior to surgical incision.  Height: 5\' 9"  (175.3 cm) Weight: 185 lb (83.915 kg) IBW/kg (Calculated) : 70.7  Temp (24hrs), Avg:97.5 F (36.4 C), Min:96 F (35.6 C), Max:98.1 F (36.7 C)   Recent Labs Lab 04/13/16 0244  WBC 10.9*  CREATININE 1.27*    Estimated Creatinine Clearance: 68.8 mL/min (by C-G formula based on Cr of 1.27).    Allergies  Allergen Reactions  . Ciprofloxacin Nausea Only     Microbiology results: None  Thank you for allowing pharmacy to be a part of this patient's care.  Camara Rosander G 04/13/2016 12:52 PM

## 2016-04-13 NOTE — Consult Note (Addendum)
Consult: Left distal ureteral stone Requested by: Dr. Beather Arbour  History of Present Illness: 50 year old diabetic male who developed left flank pain earlier today. A CT scan revealed a 7 mm left distal ureteral stone with proximal hydroureteronephrosis. He had intractable pain and nausea. He was admitted for management. He's had urgency. No dysuria. UA showed wbc's, tntc rbc's, rare bac. I should add he continues to have LLQ pain, too. He has not seen a stone pass. He does have a h/o of pneumonia and what sounds like UTI after this and "cystoscopy", but this was several years ago.   Past Medical History  Diagnosis Date  . Diabetes mellitus without complication ([place])   . GERD (gastroesophageal reflux disease)   . Hypertension   . Hyperlipidemia   . Erectile dysfunction    Past Surgical History  Procedure Laterality Date  . Shoulder surgery Right   . Hernia repair    . Prepatellar bursa excision      and ostetomy  . Esophagogastroduodenoscopy (egd) with propofol N/A 12/27/2015    Procedure: ESOPHAGOGASTRODUODENOSCOPY (EGD) WITH PROPOFOL;  Surgeon: Manya Silvas, MD;  Location: [place];  Service: Endoscopy;  Laterality: N/A;    Home Medications:  Prescriptions prior to admission  Medication Sig Dispense Refill Last Dose  . benzonatate (TESSALON) 200 MG capsule Take 200 mg by mouth 3 (three) times daily as needed for cough.   prn at prn  . ciclopirox (PENLAC) 8 % solution Apply topically at bedtime. Apply over nail and surrounding skin. Apply daily over previous coat. After seven (7) days, may remove with alcohol and continue cycle.   04/12/2016 at Unknown time  . fenofibrate (TRICOR) 145 MG tablet Take 145 mg by mouth daily.   04/12/2016 at Unknown time  . ferrous gluconate (FERGON) 324 MG tablet Take 1 tablet by mouth 3 (three) times daily with meals.  1 04/12/2016 at Unknown time  . fluticasone (FLONASE) 50 MCG/ACT nasal spray Place 2 sprays into both nostrils daily as needed for  allergies.    prn at prn  . glimepiride (AMARYL) 2 MG tablet Take 2 mg by mouth daily with breakfast.   04/12/2016 at Unknown time  . HYDROcodone-acetaminophen (NORCO/VICODIN) 5-325 MG tablet Take 1 tablet by mouth every 6 (six) hours as needed for moderate pain.   prn at prn  . ketorolac (TORADOL) 10 MG tablet Take 10 mg by mouth every 6 (six) hours as needed for moderate pain.   prn at prn  . levocetirizine (XYZAL) 5 MG tablet Take 5 mg by mouth daily.  11 04/12/2016 at Unknown time  . losartan (COZAAR) 25 MG tablet Take 25 mg by mouth daily.   04/12/2016 at Unknown time  . metFORMIN (GLUCOPHAGE) 850 MG tablet Take 850 mg by mouth 3 (three) times daily.   04/12/2016 at Unknown time  . Multiple Vitamin (MULTIVITAMIN WITH MINERALS) TABS tablet Take 1 tablet by mouth daily.   04/12/2016 at Unknown time  . pantoprazole (PROTONIX) 40 MG tablet Take 40 mg by mouth 2 (two) times daily.    04/12/2016 at Unknown time  . sildenafil (VIAGRA) 100 MG tablet Take 100 mg by mouth daily as needed for erectile dysfunction.   prn at prn  . tiZANidine (ZANAFLEX) 2 MG tablet Take by mouth at bedtime as needed for muscle spasms.   prn at prn  . traMADol (ULTRAM) 50 MG tablet Take 50 mg by mouth every 6 (six) hours as needed for moderate pain.    prn at  prn  . vitamin C (ASCORBIC ACID) 500 MG tablet Take 500 mg by mouth daily.   04/12/2016 at Unknown time   Allergies:  Allergies  Allergen Reactions  . Ciprofloxacin Nausea Only    No family history on file. Social History:  reports that he has never smoked. He has never used smokeless tobacco. He reports that he does not drink alcohol or use illicit drugs.  ROS: A complete review of systems was performed.  All systems are negative except for pertinent findings as noted. ROS   Physical Exam:  Vital signs in last 24 hours: Temp:  [98 F (36.7 C)-98.1 F (36.7 C)] 98 F (36.7 C) (06/05 1033) Pulse Rate:  [70-99] 99 (06/05 1033) Resp:  [18-19] 19 (06/05 1028) BP:  (130-157)/(77-116) 134/98 mmHg (06/05 1033) SpO2:  [93 %-100 %] 100 % (06/05 1033) Weight:  [83.915 kg (185 lb)] 83.915 kg (185 lb) (06/05 0234) General:  Alert and oriented, No acute distress HEENT: Normocephalic, atraumatic Cardiovascular: Regular rate and rhythm Lungs: Regular rate and effort Abdomen: Soft, nontender, nondistended, no abdominal masses Extremities: No edema Neurologic: Grossly intact  Laboratory Data:  Results for orders placed or performed during the hospital encounter of 04/13/16 (from the past 24 hour(s))  Basic metabolic panel     Status: Abnormal   Collection Time: 04/13/16  2:44 AM  Result Value Ref Range   Sodium 139 135 - 145 mmol/L   Potassium 4.1 3.5 - 5.1 mmol/L   Chloride 107 101 - 111 mmol/L   CO2 22 22 - 32 mmol/L   Glucose, Bld 175 (H) 65 - 99 mg/dL   BUN 19 6 - 20 mg/dL   Creatinine, Ser 1.27 (H) 0.61 - 1.24 mg/dL   Calcium 9.8 8.9 - 10.3 mg/dL   GFR calc non Af Amer >60 >60 mL/min   GFR calc Af Amer >60 >60 mL/min   Anion gap 10 5 - 15  CBC     Status: Abnormal   Collection Time: 04/13/16  2:44 AM  Result Value Ref Range   WBC 10.9 (H) 3.8 - 10.6 K/uL   RBC 4.66 4.40 - 5.90 MIL/uL   Hemoglobin 13.7 13.0 - 18.0 g/dL   HCT 40.5 40.0 - 52.0 %   MCV 86.9 80.0 - 100.0 fL   MCH 29.4 26.0 - 34.0 pg   MCHC 33.9 32.0 - 36.0 g/dL   RDW 14.0 11.5 - 14.5 %   Platelets 367 150 - 440 K/uL  TSH     Status: None   Collection Time: 04/13/16  2:44 AM  Result Value Ref Range   TSH 3.098 0.350 - 4.500 uIU/mL  Glucose, capillary     Status: Abnormal   Collection Time: 04/13/16  8:12 AM  Result Value Ref Range   Glucose-Capillary 156 (H) 65 - 99 mg/dL   No results found for this or any previous visit (from the past 240 hour(s)). Creatinine:  Recent Labs  04/13/16 0244  CREATININE 1.27*    Impression/Assessment/plan: I discussed with the patient and his wife the nature, potential benefits, risks and alternatives to cystoscopy with left retrograde  pyelogram, left ureteral stent, possible left ureteroscopy with holmium laser lithotripsy and stone basket extraction, including side effects of the proposed treatment, the likelihood of the patient achieving the goals of the procedure, and any potential problems that might occur during the procedure or recuperation. We discussed continued stone passage and adding tamsulosin. All questions answered. I do not believe he has a UTI,  but given his history above, h/o DM and rare bac on U/A, I think limited intervention in the distal ureter is warranted without renal URS. I will send a Cx. Patient elects to proceed.    Hermela Hardt 04/13/2016

## 2016-04-13 NOTE — ED Notes (Signed)
Pt reports started yesterday with pain to his left groin area that has gotten progressively worse. Pt reports difficulty urinating and his urine has a brownish tint to it. Pt reports similar to prior kidney stones pain.

## 2016-04-13 NOTE — Discharge Instructions (Signed)
Ureteral Stent Implantation, Care After Refer to this sheet in the next few weeks. These instructions provide you with information on caring for yourself after your procedure. Your health care provider may also give you more specific instructions. Your treatment has been planned according to current medical practices, but problems sometimes occur. Call your health care provider if you have any problems or questions after your procedure.  Removal of the stent: Remove the stent by pulling the string with slow steady pressure on the morning of Friday, 04/17/2016.  WHAT TO EXPECT AFTER THE PROCEDURE You should be back to normal activity within 48 hours after the procedure. Nausea and vomiting may occur and are commonly the result of anesthesia. It is common to experience sharp pain in the back or lower abdomen and penis with voiding. This is caused by movement of the ends of the stent with the act of urinating.It usually goes away within minutes after you have stopped urinating. HOME CARE INSTRUCTIONS Make sure to drink plenty of fluids. You may have small amounts of bleeding, causing your urine to be red. This is normal. Certain movements may trigger pain or a feeling that you need to urinate. You may be given medicines to prevent infection or bladder spasms. Be sure to take all medicines as directed. Only take over-the-counter or prescription medicines for pain, discomfort, or fever as directed by your health care provider. Do not take aspirin, as this can make bleeding worse.  Be sure to keep all follow-up appointments so your health care provider can check that you are healing properly.  SEEK MEDICAL CARE IF:  You experience increasing pain.  Your pain medicine is not working. SEEK IMMEDIATE MEDICAL CARE IF:  Your urine is dark red or has blood clots.  You are leaking urine (incontinent).  You have a fever, chills, feeling sick to your stomach (nausea), or vomiting.  Your pain is not  relieved by pain medicine.  The end of the stent comes out of the urethra.  You are unable to urinate.   This information is not intended to replace advice given to you by your health care provider. Make sure you discuss any questions you have with your health care provider.   Document Released: 06/28/2013 Document Revised: 10/31/2013 Document Reviewed: 05/10/2015 Elsevier Interactive Patient Education [employer].

## 2016-04-13 NOTE — H&P (Signed)
Jesse Alvarado is an 50 y.o. male.   Chief Complaint: Flank pain HPI: This is a 50 year old male past medical history of renal calculi and diabetes who presents to the emergency department complaining of left lower quadrant and flank pain. The patient states that the pain began approximately 9 hours prior to admission. He was able to lay down and nap prior to being awakened by the pain. The patient admits to nausea and heaves but did not vomit. He denies constipation or dysuria. He notes that he has been able to make only scant amounts of urine since the pain began. In the emergency department CT scan revealed an 8 mm stone. Urology was consulted and the hospitalist service was contacted for preoperative management.  Past Medical History  Diagnosis Date  . Diabetes mellitus without complication ([place])   . GERD (gastroesophageal reflux disease)   . Hypertension   . Hyperlipidemia   . Erectile dysfunction     Past Surgical History  Procedure Laterality Date  . Shoulder surgery Right   . Hernia repair    . Prepatellar bursa excision      and ostetomy  . Esophagogastroduodenoscopy (egd) with propofol N/A 12/27/2015    Procedure: ESOPHAGOGASTRODUODENOSCOPY (EGD) WITH PROPOFOL;  Surgeon: Manya Silvas, MD;  Location: [facility];  Service: Endoscopy;  Laterality: N/A;    No family history on file. Social History:  reports that he has never smoked. He has never used smokeless tobacco. He reports that he does not drink alcohol or use illicit drugs.  Allergies:  Allergies  Allergen Reactions  . Ciprofloxacin Nausea Only    Prior to Admission medications   Medication Sig Start Date End Date Taking? Authorizing Provider  benzonatate (TESSALON) 200 MG capsule Take 200 mg by mouth 3 (three) times daily as needed for cough.   Yes Historical Provider, MD  ciclopirox (PENLAC) 8 % solution Apply topically at bedtime. Apply over nail and surrounding skin. Apply daily over previous coat. After  seven (7) days, may remove with alcohol and continue cycle.   Yes Historical Provider, MD  fenofibrate (TRICOR) 145 MG tablet Take 145 mg by mouth daily.   Yes Historical Provider, MD  ferrous gluconate (FERGON) 324 MG tablet Take 1 tablet by mouth 3 (three) times daily with meals. 03/25/16  Yes Historical Provider, MD  fluticasone (FLONASE) 50 MCG/ACT nasal spray Place 2 sprays into both nostrils daily as needed for allergies.    Yes Historical Provider, MD  glimepiride (AMARYL) 2 MG tablet Take 2 mg by mouth daily with breakfast.   Yes Historical Provider, MD  HYDROcodone-acetaminophen (NORCO/VICODIN) 5-325 MG tablet Take 1 tablet by mouth every 6 (six) hours as needed for moderate pain.   Yes Historical Provider, MD  ketorolac (TORADOL) 10 MG tablet Take 10 mg by mouth every 6 (six) hours as needed for moderate pain.   Yes Historical Provider, MD  levocetirizine (XYZAL) 5 MG tablet Take 5 mg by mouth daily. 03/25/16  Yes Historical Provider, MD  losartan (COZAAR) 25 MG tablet Take 25 mg by mouth daily.   Yes Historical Provider, MD  metFORMIN (GLUCOPHAGE) 850 MG tablet Take 850 mg by mouth 3 (three) times daily.   Yes Historical Provider, MD  Multiple Vitamin (MULTIVITAMIN WITH MINERALS) TABS tablet Take 1 tablet by mouth daily.   Yes Historical Provider, MD  pantoprazole (PROTONIX) 40 MG tablet Take 40 mg by mouth 2 (two) times daily.    Yes Historical Provider, MD  sildenafil (VIAGRA) 100 MG  tablet Take 100 mg by mouth daily as needed for erectile dysfunction.   Yes Historical Provider, MD  tiZANidine (ZANAFLEX) 2 MG tablet Take by mouth at bedtime as needed for muscle spasms.   Yes Historical Provider, MD  traMADol (ULTRAM) 50 MG tablet Take 50 mg by mouth every 6 (six) hours as needed for moderate pain.    Yes Historical Provider, MD  vitamin C (ASCORBIC ACID) 500 MG tablet Take 500 mg by mouth daily.   Yes Historical Provider, MD     Results for orders placed or performed during the hospital  encounter of 04/13/16 (from the past 48 hour(s))  Basic metabolic panel     Status: Abnormal   Collection Time: 04/13/16  2:44 AM  Result Value Ref Range   Sodium 139 135 - 145 mmol/L   Potassium 4.1 3.5 - 5.1 mmol/L   Chloride 107 101 - 111 mmol/L   CO2 22 22 - 32 mmol/L   Glucose, Bld 175 (H) 65 - 99 mg/dL   BUN 19 6 - 20 mg/dL   Creatinine, Ser 1.27 (H) 0.61 - 1.24 mg/dL   Calcium 9.8 8.9 - 10.3 mg/dL   GFR calc non Af Amer >60 >60 mL/min   GFR calc Af Amer >60 >60 mL/min    Comment: (NOTE) The eGFR has been calculated using the CKD EPI equation. This calculation has not been validated in all clinical situations. eGFR's persistently <60 mL/min signify possible Chronic Kidney Disease.    Anion gap 10 5 - 15  CBC     Status: Abnormal   Collection Time: 04/13/16  2:44 AM  Result Value Ref Range   WBC 10.9 (H) 3.8 - 10.6 K/uL   RBC 4.66 4.40 - 5.90 MIL/uL   Hemoglobin 13.7 13.0 - 18.0 g/dL   HCT 40.5 40.0 - 52.0 %   MCV 86.9 80.0 - 100.0 fL   MCH 29.4 26.0 - 34.0 pg   MCHC 33.9 32.0 - 36.0 g/dL   RDW 14.0 11.5 - 14.5 %   Platelets 367 150 - 440 K/uL   Ct Renal Stone Study  04/13/2016  CLINICAL DATA:  Initial evaluation for acute left flank pain. EXAM: CT ABDOMEN AND PELVIS WITHOUT CONTRAST TECHNIQUE: Multidetector CT imaging of the abdomen and pelvis was performed following the standard protocol without IV contrast. COMPARISON:  None. FINDINGS: Patchy opacity present within the left lung base. Atelectasis is favored, although possible infiltrate could be considered in the correct clinical setting. Scattered atelectatic changes within the right lung base dependently. Visualized lungs are otherwise clear. Diffuse hypoattenuation of the liver consistent with steatosis. Gallbladder normal. No biliary dilatation. Spleen, adrenal glands, and pancreas demonstrate a normal unenhanced appearance. Multiple nonobstructive stones measuring up to 6 mm present within the right kidney. No  right-sided hydronephrosis. No radiopaque calculi seen along the course of the right renal collecting system. No hydroureter. On the left, there is an obstructive 8 mm stone at the left UVJ with secondary mild to moderate left hydroureteronephrosis. Additional 3 mm stone within the mid-distal left ureter. Additional nonobstructive calculi within the mid and lower left kidney measuring up to 5 mm. Mild left perinephric and periureteral fat stranding. Intraluminal fluid present E partially visualized distal esophagus, which may reflect sequela of reflux. Stomach within normal limits. No evidence for bowel obstruction. Appendix normal. No acute inflammatory changes about the bowels. Colonic diverticulosis without evidence for acute diverticulitis. Bladder within normal limits. Prostate enlarged with median lobe hypertrophy. Prostate measures 5.4 cm  in transverse diameter. No free air or fluid.  No adenopathy. Chronic pars defects at L5 with grade 1 spondylolisthesis. No acute osseous abnormality. No worrisome lytic or blastic osseous lesions. IMPRESSION: 1. 8 mm obstructive left stone at the left UVJ with secondary mild to moderate left hydroureteronephrosis. Additional 3 mm stone within the dilated mid -distal left ureter. 2. Additional bilateral nonobstructive nephrolithiasis as above. 3. Enlarged prostate with median lobe hypertrophy. 4. Colonic diverticulosis without evidence for acute diverticulitis. 5. Hepatic steatosis. 6. Chronic grade 1 spondylolisthesis of L5 on S1. Electronically Signed   By: Jeannine Boga M.D.   On: 04/13/2016 04:48    Review of Systems  Constitutional: Negative for fever and chills.  HENT: Negative for sore throat and tinnitus.   Eyes: Negative for blurred vision and redness.  Respiratory: Negative for cough and shortness of breath.   Cardiovascular: Negative for chest pain, palpitations, orthopnea and PND.  Gastrointestinal: Positive for nausea and abdominal pain. Negative  for vomiting and diarrhea.  Genitourinary: Negative for dysuria, urgency and frequency.  Musculoskeletal: Negative for myalgias and joint pain.  Skin: Negative for rash.       No lesions  Neurological: Negative for speech change, focal weakness and weakness.  Endo/Heme/Allergies: Does not bruise/bleed easily.       No temperature intolerance  Psychiatric/Behavioral: Negative for depression and suicidal ideas.    Blood pressure 139/82, pulse 88, temperature 98.1 F (36.7 C), temperature source Oral, resp. rate 18, height 5' 9" (1.753 m), weight 83.915 kg (185 lb), SpO2 97 %. Physical Exam  Vitals reviewed. Constitutional: He is oriented to person, place, and time. He appears well-developed and well-nourished. No distress.  HENT:  Head: Normocephalic and atraumatic.  Mouth/Throat: Oropharynx is clear and moist.  Eyes: Conjunctivae and EOM are normal. Pupils are equal, round, and reactive to light. No scleral icterus.  Neck: Normal range of motion. Neck supple. No JVD present. No tracheal deviation present. No thyromegaly present.  Cardiovascular: Normal rate, regular rhythm and normal heart sounds.   Respiratory: Effort normal and breath sounds normal. No respiratory distress. He has no wheezes.  GI: Soft. Bowel sounds are normal. He exhibits no distension and no mass. There is tenderness. There is no rebound and no guarding.  Genitourinary:  Deferred  Musculoskeletal: Normal range of motion. He exhibits no edema.  Lymphadenopathy:    He has no cervical adenopathy.  Neurological: He is alert and oriented to person, place, and time.  Skin: Skin is warm and dry. No rash noted. No erythema. No pallor.  Psychiatric: He has a normal mood and affect. His behavior is normal. Judgment and thought content normal.     Assessment/Plan This is a 50 year old male admitted for obstructive kidney stone. 1. Nephrolithiasis: 8 mm on CT; urology is been consulted. Hydrate with intravenous fluid and  manage pain. 2. Acute kidney injury: Likely secondary to hydronephrosis. Avoid nephrotoxic agents 3. Essential hypertension: Controlled; continue Cozaar 4. Diabetes mellitus type 2: Hold oral hypoglycemics. Sliding scale insulin while patient is hospitalized 5. DVT prophylaxis: SCDs 6. GI prophylaxis: Protonix per home regimen The patient is a full code. Time spent on admission was inpatient care approximately 45 minutes  Harrie Foreman, MD 04/13/2016, 7:06 AM

## 2016-04-13 NOTE — Transfer of Care (Signed)
Immediate Anesthesia Transfer of Care Note  Patient: Jesse Alvarado  Procedure(s) Performed: Procedure(s): CYSTOSCOPY/RETROGRADE PYELOGRAM/URETEROSCOPY WITH HOLMIUM LASER LITHOTRIPSY//STENT PLACEMENT (Left)  Patient Location: PACU  Anesthesia Type:General  Level of Consciousness: sedated  Airway & Oxygen Therapy: Patient Spontanous Breathing and Patient connected to face mask oxygen  Post-op Assessment: Report given to RN and Post -op Vital signs reviewed and stable  Post vital signs: Reviewed and stable  Last Vitals:  Filed Vitals:   04/13/16 1229 04/13/16 1349  BP: 116/81 109/69  Pulse: 95 95  Temp: 35.6 C   Resp: 16 12    Last Pain:  Filed Vitals:   04/13/16 1349  PainSc: 5       Patients Stated Pain Goal: 0 ([id number])  Complications: No apparent anesthesia complications

## 2016-04-13 NOTE — Op Note (Signed)
Preoperative diagnosis: Left distal ureteral stone, hydronephrosis, renal stones Postoperative diagnosis: Same  Procedure: Cystoscopy with left distal ureteroscopy, holmium laser lithotripsy, stone basket extraction and stent placement  Surgeon: Junious Silk  Anesthesia: Gen.  Indication for procedure: 50 year old diabetic male with symptomatically distal ureteral stone and hydronephrosis. He was brought for above procedures.  Findings: On exam under anesthesia the penis was circumcised and without mass or lesion. Testicles were descended bilaterally and palpably normal. On digital rectal exam the prostate was small and benign without hard area or nodule.  Cystoscopy revealed an unremarkable urethra, prostate and bladder. There were no stones or foreign bodies in the bladder. However, the urine in the bladder was quite cloudy and full of debris. I think it was a good decision to just do the minimal manipulation. I sent urine for culture.  Left retrograde pyelogram-this outlined a filling defect in the left distal ureter consistent with the stone and proximal mild hydroureteronephrosis.  Left ureteroscopy revealed the ureteral stone and also the smaller stone above it.  The ureteral stones were removed.  Description of procedure: After consent was obtained the patient was brought to the operating room. After adequate anesthesia he is placed in lithotomy position. An exam under anesthesia was performed. He was prepped and draped in the usual sterile fashion. A timeout was performed to confirm the patient and procedure. The cystoscope was passed per urethra and the left ureteral orifice cannulated with a 5 [place] open-ended catheter. Retrograde injection of contrast was performed and then a sensor wire was advanced and coiled in the collecting system. I passed the 5 [place] over the wire as the UO looked quite tight. Indeed when I passed the semirigid ureteroscope I could not get into the UO. I then  passed a 6 [place] open-ended catheter and this dilated the UO just enough to get the scope in. The stone was visualized and fragmented at a setting of 0.8 N/A. A 0 tip basket was used to remove the stone fragments as well as the smaller more proximal stone. Having clear the ureter I was able to run the scope up just over the iliacs and did not see any other fragments or stones. The scope was removed. The cystoscope was passed per urethra and I drained the stone and stone fragments from the bladder. The scope was removed. The wire was backloaded on the cystoscope and a cystoscope readvanced. A  6 x 26 cm stent was advanced. The wire was removed with a good coil seen in the kidney and a good coil in the bladder. The patient was awakened and taken to recovery room in stable condition.   Complications: None   Blood loss: Minimal   Drains: 6 x 26 cm left ureteral stent with string-I'll have the patient removed this Friday   Specimens to lab:   #1 urine for culture #2 and Stone fragments for analysis

## 2016-04-13 NOTE — Progress Notes (Signed)
Patient admitted earlier seen complains of pain plan for cystoscopy with ureteroscopy

## 2016-04-13 NOTE — ED Provider Notes (Signed)
[facility] Emergency Department Provider Note   ____________________________________________  Time seen: Approximately 3:44 AM  I have reviewed the triage vital signs and the nursing notes.   HISTORY  Chief Complaint Nephrolithiasis    HPI Jesse Alvarado is a 50 y.o. male who presents to the ED from home with a chief complaint of left groin pain and difficulty urinating.Patient has a history of kidney stones, neither of which required lithotripsy or stenting. Complains of sudden onset left groin pain which awakened him at 12:30 AM. Had difficulty urinating; states he is dribbling small amounts of dark urine. Pain associated with nausea and vomiting. Denies recent fever, chills, chest pain, shortness of breath, diarrhea. Denies recent travel trauma. Nothing makes his symptoms better or worse.   Past Medical History  Diagnosis Date  . Diabetes mellitus without complication ([place])   . GERD (gastroesophageal reflux disease)   . Hypertension   . Hyperlipidemia   . Erectile dysfunction     There are no active problems to display for this patient.   Past Surgical History  Procedure Laterality Date  . Shoulder surgery Right   . Hernia repair    . Prepatellar bursa excision      and ostetomy  . Esophagogastroduodenoscopy (egd) with propofol N/A 12/27/2015    Procedure: ESOPHAGOGASTRODUODENOSCOPY (EGD) WITH PROPOFOL;  Surgeon: Manya Silvas, MD;  Location: [place];  Service: Endoscopy;  Laterality: N/A;    Current Outpatient Rx  Name  Route  Sig  Dispense  Refill  . benzonatate (TESSALON) 200 MG capsule   Oral   Take 200 mg by mouth 3 (three) times daily as needed for cough.         . ciclopirox (PENLAC) 8 % solution   Topical   Apply topically at bedtime. Apply over nail and surrounding skin. Apply daily over previous coat. After seven (7) days, may remove with alcohol and continue cycle.         . fenofibrate (TRICOR) 145 MG  tablet   Oral   Take 145 mg by mouth daily.         . ferrous gluconate (FERGON) 324 MG tablet   Oral   Take 1 tablet by mouth 3 (three) times daily with meals.      1   . fluticasone (FLONASE) 50 MCG/ACT nasal spray   Each Nare   Place 2 sprays into both nostrils daily as needed for allergies.          Marland Kitchen glimepiride (AMARYL) 2 MG tablet   Oral   Take 2 mg by mouth daily with breakfast.         . HYDROcodone-acetaminophen (NORCO/VICODIN) 5-325 MG tablet   Oral   Take 1 tablet by mouth every 6 (six) hours as needed for moderate pain.         Marland Kitchen ketorolac (TORADOL) 10 MG tablet   Oral   Take 10 mg by mouth every 6 (six) hours as needed for moderate pain.         Marland Kitchen levocetirizine (XYZAL) 5 MG tablet   Oral   Take 5 mg by mouth daily.      11   . losartan (COZAAR) 25 MG tablet   Oral   Take 25 mg by mouth daily.         . metFORMIN (GLUCOPHAGE) 850 MG tablet   Oral   Take 850 mg by mouth 3 (three) times daily.         Marland Kitchen  Multiple Vitamin (MULTIVITAMIN WITH MINERALS) TABS tablet   Oral   Take 1 tablet by mouth daily.         . pantoprazole (PROTONIX) 40 MG tablet   Oral   Take 40 mg by mouth 2 (two) times daily.          . sildenafil (VIAGRA) 100 MG tablet   Oral   Take 100 mg by mouth daily as needed for erectile dysfunction.         Marland Kitchen tiZANidine (ZANAFLEX) 2 MG tablet   Oral   Take by mouth at bedtime as needed for muscle spasms.         . traMADol (ULTRAM) 50 MG tablet   Oral   Take 50 mg by mouth every 6 (six) hours as needed.         . vitamin C (ASCORBIC ACID) 500 MG tablet   Oral   Take 500 mg by mouth daily.           Allergies Ciprofloxacin  No family history on file.  Social History Social History  Substance Use Topics  . Smoking status: Never Smoker   . Smokeless tobacco: Never Used  . Alcohol Use: No    Review of Systems  Constitutional: No fever/chills. Eyes: No visual changes. ENT: No sore  throat. Cardiovascular: Denies chest pain. Respiratory: Denies shortness of breath. Gastrointestinal: Positive for left lower quadrant abdominal pain, nausea and vomiting.  No diarrhea.  No constipation. Genitourinary: Positive for urinary hesitancy and dark urine. Negative for dysuria. Musculoskeletal: Negative for back pain. Skin: Negative for rash. Neurological: Negative for headaches, focal weakness or numbness.  10-point ROS otherwise negative.  ____________________________________________   PHYSICAL EXAM:  VITAL SIGNS: ED Triage Vitals  Enc Vitals Group     BP 04/13/16 0234 146/97 mmHg     Pulse Rate 04/13/16 0234 90     Resp 04/13/16 0234 18     Temp 04/13/16 0234 98.1 F (36.7 C)     Temp Source 04/13/16 0234 Oral     SpO2 04/13/16 0234 100 %     Weight 04/13/16 0234 185 lb (83.915 kg)     Height 04/13/16 0234 5\' 9"  (1.753 m)     Head Cir --      Peak Flow --      Pain Score 04/13/16 0236 10     Pain Loc --      Pain Edu? --      Excl. in [place]? --     Constitutional: Alert and oriented. Well appearing and in mild acute distress. Eyes: Conjunctivae are normal. PERRL. EOMI. Head: Atraumatic. Nose: No congestion/rhinnorhea. Mouth/Throat: Mucous membranes are moist.  Oropharynx non-erythematous. Neck: No stridor.   Cardiovascular: Normal rate, regular rhythm. Grossly normal heart sounds.  Good peripheral circulation. Respiratory: Normal respiratory effort.  No retractions. Lungs CTAB. Gastrointestinal: Soft and mildly tender to palpation left lower quadrant without rebound or guarding. No distention. No abdominal bruits. No CVA tenderness. Musculoskeletal: No lower extremity tenderness nor edema.  No joint effusions. Neurologic:  Normal speech and language. No gross focal neurologic deficits are appreciated.  Skin:  Skin is warm, dry and intact. No rash noted. Psychiatric: Mood and affect are normal. Speech and behavior are  normal.  ____________________________________________   LABS (all labs ordered are listed, but only abnormal results are displayed)  Labs Reviewed  BASIC METABOLIC PANEL - Abnormal; Notable for the following:    Glucose, Bld 175 (*)    Creatinine, Ser  1.27 (*)    All other components within normal limits  CBC - Abnormal; Notable for the following:    WBC 10.9 (*)    All other components within normal limits  URINALYSIS COMPLETEWITH MICROSCOPIC ([facility] ONLY)   ____________________________________________  EKG  None ____________________________________________  RADIOLOGY  CT renal colic study interpreted per Dr. Jeannine Boga: 1. 8 mm obstructive left stone at the left UVJ with secondary mild to moderate left hydroureteronephrosis. Additional 3 mm stone within the dilated mid -distal left ureter. 2. Additional bilateral nonobstructive nephrolithiasis as above. 3. Enlarged prostate with median lobe hypertrophy. 4. Colonic diverticulosis without evidence for acute diverticulitis. 5. Hepatic steatosis. 6. Chronic grade 1 spondylolisthesis of L5 on S1. ____________________________________________   PROCEDURES  Procedure(s) performed: None  Critical Care performed: No  ____________________________________________   INITIAL IMPRESSION / ASSESSMENT AND PLAN / ED COURSE  Pertinent labs & imaging results that were available during my care of the patient were reviewed by me and considered in my medical decision making (see chart for details).  50 year old male with a history of nephrolithiasis who presents with sudden onset left groin pain. Somewhat improved after 2 doses of IV Dilaudid. Updated patient and family of laboratory results. We'll hang second liter IV fluid to encourage urination. Will obtain bladder scan and CT renal colic study.  ----------------------------------------- 5:15 AM on 04/13/2016 -----------------------------------------  Bladder scan 289mL again  liter IV fluids infusing. Third dose of IV Dilaudid given. Discussed with urology Dr. Junious Silk; okay to give Toradol if needed. Will discuss with hospitalist to evaluate patient in the emergency department for admission for intractable pain.. ____________________________________________   FINAL CLINICAL IMPRESSION(S) / ED DIAGNOSES  Final diagnoses:  Kidney stone  Intractable pain      NEW MEDICATIONS STARTED DURING THIS VISIT:  New Prescriptions   No medications on file     Note:  This document was prepared using Dragon voice recognition software and may include unintentional dictation errors.    Paulette Blanch, MD 04/13/16 [phone]

## 2016-04-13 NOTE — Progress Notes (Addendum)
Pt s/p left ureteroscopy, stone removal, stent -- he could be discharged TODAY if stable. Send home with some pain meds and abx (cephalexin 500 mg pd BID good) for 5 days. I did send urine for Cx in OR.   I wrote for pt to remove stent Friday morning, but he could call the office if he needs help.

## 2016-04-13 NOTE — Consult Note (Signed)
Chart reviewed - 7 mm left UVJ stone - I'll add on for left stent and ureteroscopy today. Full consult to follow.

## 2016-04-14 ENCOUNTER — Telehealth: Payer: Self-pay

## 2016-04-14 DIAGNOSIS — N200 Calculus of kidney: Secondary | ICD-10-CM

## 2016-04-14 LAB — URINE CULTURE: Culture: NO GROWTH

## 2016-04-14 LAB — BASIC METABOLIC PANEL
Anion gap: 5 (ref 5–15)
BUN: 16 mg/dL (ref 6–20)
CHLORIDE: 114 mmol/L — AB (ref 101–111)
CO2: 23 mmol/L (ref 22–32)
CREATININE: 1.25 mg/dL — AB (ref 0.61–1.24)
Calcium: 8.1 mg/dL — ABNORMAL LOW (ref 8.9–10.3)
GFR calc non Af Amer: >60 mL/min (ref >60)
Glucose, Bld: 132 mg/dL — ABNORMAL HIGH (ref 65–99)
POTASSIUM: 3.9 mmol/L (ref 3.5–5.1)
SODIUM: 142 mmol/L (ref 135–145)

## 2016-04-14 LAB — GLUCOSE, CAPILLARY: Glucose-Capillary: 110 mg/dL — ABNORMAL HIGH (ref 65–99)

## 2016-04-14 MED ORDER — CEFUROXIME AXETIL 500 MG PO TABS: 500.0000 mg | ORAL_TABLET | Freq: Two times a day (BID) | ORAL | Status: DC

## 2016-04-14 MED ORDER — HYDROCODONE-ACETAMINOPHEN 5-325 MG PO TABS: 1.0000 | ORAL_TABLET | Freq: Four times a day (QID) | ORAL | Status: DC | PRN

## 2016-04-14 NOTE — Telephone Encounter (Signed)
-----   Message from Festus Aloe, MD sent at 04/13/2016  1:51 PM EDT ----- Patient status post cystoscopy with left retropyelogram, left ureteroscopy, laser lithotripsy, stone basket extraction and stent placement.  I wrote instructions for patient to remove the stent on Friday but they may call for help with this.  He should f/u in 6 weeks with a renal ultrasound.

## 2016-04-14 NOTE — Anesthesia Postprocedure Evaluation (Signed)
Anesthesia Post Note  Patient: BRENHAM RAYNER  Procedure(s) Performed: Procedure(s) (LRB): CYSTOSCOPY/RETROGRADE PYELOGRAM/URETEROSCOPY WITH HOLMIUM LASER LITHOTRIPSY//STENT PLACEMENT (Left)  Patient location during evaluation: PACU Anesthesia Type: General Level of consciousness: awake and alert Pain management: pain level controlled Vital Signs Assessment: post-procedure vital signs reviewed and stable Respiratory status: spontaneous breathing, nonlabored ventilation, respiratory function stable and patient connected to nasal cannula oxygen Cardiovascular status: blood pressure returned to baseline and stable Postop Assessment: no signs of nausea or vomiting Anesthetic complications: no    Last Vitals:  Filed Vitals:   04/14/16 0020 04/14/16 0539  BP: 110/61 123/67  Pulse: 79 79  Temp: 36.7 C 36.8 C  Resp: 18 18    Last Pain:  Filed Vitals:   04/14/16 1205  PainSc: 4                  Molli Barrows

## 2016-04-14 NOTE — Progress Notes (Signed)
04/14/2016 12:37 PM  Jesse Alvarado to be D/C'd Home per MD order.  Discussed prescriptions and follow up appointments with the patient. Prescriptions given to patient, medication list explained in detail. Pt verbalized understanding.    Medication List    TAKE these medications        benzonatate 200 MG capsule  Commonly known as:  TESSALON  Take 200 mg by mouth 3 (three) times daily as needed for cough.     cefUROXime 500 MG tablet  Commonly known as:  CEFTIN  Take 1 tablet (500 mg total) by mouth 2 (two) times daily with a meal.     ciclopirox 8 % solution  Commonly known as:  PENLAC  Apply topically at bedtime. Apply over nail and surrounding skin. Apply daily over previous coat. After seven (7) days, may remove with alcohol and continue cycle.     fenofibrate 145 MG tablet  Commonly known as:  TRICOR  Take 145 mg by mouth daily.     ferrous gluconate 324 MG tablet  Commonly known as:  FERGON  Take 1 tablet by mouth 3 (three) times daily with meals.     fluticasone 50 MCG/ACT nasal spray  Commonly known as:  FLONASE  Place 2 sprays into both nostrils daily as needed for allergies.     glimepiride 2 MG tablet  Commonly known as:  AMARYL  Take 2 mg by mouth daily with breakfast.     HYDROcodone-acetaminophen 5-325 MG tablet  Commonly known as:  NORCO/VICODIN  Take 1-2 tablets by mouth every 6 (six) hours as needed for moderate pain.     ketorolac 10 MG tablet  Commonly known as:  TORADOL  Take 10 mg by mouth every 6 (six) hours as needed for moderate pain.     levocetirizine 5 MG tablet  Commonly known as:  XYZAL  Take 5 mg by mouth daily.     losartan 25 MG tablet  Commonly known as:  COZAAR  Take 25 mg by mouth daily.     metFORMIN 850 MG tablet  Commonly known as:  GLUCOPHAGE  Take 850 mg by mouth 3 (three) times daily.     multivitamin with minerals Tabs tablet  Take 1 tablet by mouth daily.     pantoprazole 40 MG tablet  Commonly known as:   PROTONIX  Take 40 mg by mouth 2 (two) times daily.     sildenafil 100 MG tablet  Commonly known as:  VIAGRA  Take 100 mg by mouth daily as needed for erectile dysfunction.     tiZANidine 2 MG tablet  Commonly known as:  ZANAFLEX  Take by mouth at bedtime as needed for muscle spasms.     traMADol 50 MG tablet  Commonly known as:  ULTRAM  Take 50 mg by mouth every 6 (six) hours as needed for moderate pain.     vitamin C 500 MG tablet  Commonly known as:  ASCORBIC ACID  Take 500 mg by mouth daily.        Filed Vitals:   04/14/16 0020 04/14/16 0539  BP: 110/61 123/67  Pulse: 79 79  Temp: 98.1 F (36.7 C) 98.3 F (36.8 C)  Resp: 18 18    Skin clean, dry and intact without evidence of skin break down, no evidence of skin tears noted. IV catheter discontinued intact. Site without signs and symptoms of complications. Dressing and pressure applied. Pt denies pain at this time. No complaints noted.  An After Visit  Summary was printed and given to the patient. Patient escorted via [place], and D/C home via private auto.  Jesse Alvarado

## 2016-04-14 NOTE — Plan of Care (Signed)
Problem: Safety: Goal: Ability to remain free from injury will improve Outcome: Progressing Pt up ad lib.  No safety issues at this time.

## 2016-04-14 NOTE — Discharge Summary (Signed)
Jesse Alvarado, 50 y.o., DOB [birth year], MRN [id number]. Admission date: 04/13/2016 Discharge Date 04/14/2016 Primary MD Glendon Axe, MD Admitting Physician Harrie Foreman, MD  Admission Diagnosis  Kidney stone [N20.0] Intractable pain [R52]  Discharge Diagnosis   Active Problems:  Flank pain   Nephrolithiasis resulting in left hydronephrosis   Diabetes type 2   GERD   Hypertension Hyperlipidemia Erectile dysfunction            Hospital CourseThis is a 50 year old male past medical history of renal calculi and diabetes who presents to the emergency department complaining of left lower quadrant and flank pain. Patient was seen in the ED and had a CT scan which showed 8 mm obstructing stone resulting in hydronephrosis. Patient was seen by urology. He underwent Cystoscopy with holmium laser lithotripsy, stone basket extraction and stent placement. Patient was recommended to have his stent removed on Friday. He'll follow-up with urology as outpatient for further management. His pain is much improved and is doing much better and is stable for discharge.            Consults  urology  Significant Tests:  See full reports for all details      Ct Renal Stone Study  04/13/2016  CLINICAL DATA:  Initial evaluation for acute left flank pain. EXAM: CT ABDOMEN AND PELVIS WITHOUT CONTRAST TECHNIQUE: Multidetector CT imaging of the abdomen and pelvis was performed following the standard protocol without IV contrast. COMPARISON:  None. FINDINGS: Patchy opacity present within the left lung base. Atelectasis is favored, although possible infiltrate could be considered in the correct clinical setting. Scattered atelectatic changes within the right lung base dependently. Visualized lungs are otherwise clear. Diffuse hypoattenuation of the liver consistent with steatosis. Gallbladder normal. No biliary dilatation. Spleen, adrenal glands, and pancreas demonstrate a normal unenhanced appearance.  Multiple nonobstructive stones measuring up to 6 mm present within the right kidney. No right-sided hydronephrosis. No radiopaque calculi seen along the course of the right renal collecting system. No hydroureter. On the left, there is an obstructive 8 mm stone at the left UVJ with secondary mild to moderate left hydroureteronephrosis. Additional 3 mm stone within the mid-distal left ureter. Additional nonobstructive calculi within the mid and lower left kidney measuring up to 5 mm. Mild left perinephric and periureteral fat stranding. Intraluminal fluid present E partially visualized distal esophagus, which may reflect sequela of reflux. Stomach within normal limits. No evidence for bowel obstruction. Appendix normal. No acute inflammatory changes about the bowels. Colonic diverticulosis without evidence for acute diverticulitis. Bladder within normal limits. Prostate enlarged with median lobe hypertrophy. Prostate measures 5.4 cm in transverse diameter. No free air or fluid.  No adenopathy. Chronic pars defects at L5 with grade 1 spondylolisthesis. No acute osseous abnormality. No worrisome lytic or blastic osseous lesions. IMPRESSION: 1. 8 mm obstructive left stone at the left UVJ with secondary mild to moderate left hydroureteronephrosis. Additional 3 mm stone within the dilated mid -distal left ureter. 2. Additional bilateral nonobstructive nephrolithiasis as above. 3. Enlarged prostate with median lobe hypertrophy. 4. Colonic diverticulosis without evidence for acute diverticulitis. 5. Hepatic steatosis. 6. Chronic grade 1 spondylolisthesis of L5 on S1. Electronically Signed   By: Jeannine Boga M.D.   On: 04/13/2016 04:48       Today   Subjective:   Cynda Familia  He is better denies any significant complaints   Blood pressure 123/67, pulse 79, temperature 98.3 F (36.8 C), temperature source Oral, resp. rate 18, height 5\' 9"  (1.753  m), weight 87.771 kg (193 lb 8 oz), SpO2 97 %.   .  Intake/Output Summary (Last 24 hours) at 04/14/16 1211 Last data filed at 04/14/16 0900  Gross per 24 hour  Intake   1400 ml  Output   1976 ml  Net   -576 ml    Exam VITAL SIGNS: Blood pressure 123/67, pulse 79, temperature 98.3 F (36.8 C), temperature source Oral, resp. rate 18, height 5\' 9"  (1.753 m), weight 87.771 kg (193 lb 8 oz), SpO2 97 %.  GENERAL:  50 y.o.-year-old patient lying in the bed with no acute distress.  EYES: Pupils equal, round, reactive to light and accommodation. No scleral icterus. Extraocular muscles intact.  HEENT: Head atraumatic, normocephalic. Oropharynx and nasopharynx clear.  NECK:  Supple, no jugular venous distention. No thyroid enlargement, no tenderness.  LUNGS: Normal breath sounds bilaterally, no wheezing, rales,rhonchi or crepitation. No use of accessory muscles of respiration.  CARDIOVASCULAR: S1, S2 normal. No murmurs, rubs, or gallops.  ABDOMEN: Soft, nontender, nondistended. Bowel sounds present. No organomegaly or mass.  EXTREMITIES: No pedal edema, cyanosis, or clubbing.  NEUROLOGIC: Cranial nerves II through XII are intact. Muscle strength 5/5 in all extremities. Sensation intact. Gait not checked.  PSYCHIATRIC: The patient is alert and oriented x 3.  SKIN: No obvious rash, lesion, or ulcer.   Data Review     CBC w Diff: Lab Results  Component Value Date   WBC 10.9* 04/13/2016   WBC 9.1 09/01/2014   HGB 13.7 04/13/2016   HGB 12.8* 09/01/2014   HCT 40.5 04/13/2016   HCT 40.1 09/01/2014   PLT 367 04/13/2016   PLT 272 09/01/2014   CMP: Lab Results  Component Value Date   NA 142 04/14/2016   NA 144 09/01/2014   K 3.9 04/14/2016   K 4.0 09/01/2014   CL 114* 04/14/2016   CL 109* 09/01/2014   CO2 23 04/14/2016   CO2 25 09/01/2014   BUN 16 04/14/2016   BUN 17 09/01/2014   CREATININE 1.25* 04/14/2016   CREATININE 1.40* 09/01/2014   PROT 6.9 09/01/2014   ALBUMIN 3.7 09/01/2014   BILITOT 0.3 09/01/2014   ALKPHOS 59  09/01/2014   AST 40* 09/01/2014   ALT 61 09/01/2014  .  Micro Results No results found for this or any previous visit (from the past 240 hour(s)).      Code Status Orders        Start     Ordered   04/13/16 0743  Full code   Continuous     04/13/16 0742    Code Status History    Date Active Date Inactive Code Status Order ID Comments User Context   This patient has a current code status but no historical code status.          Follow-up Information    Follow up with Festus Aloe, MD. Go on 05/26/2016.   Specialty:  Urology   Why:  Tuesday at 9:45am for hospital follow-up. Dr. Junious Silk office will call you with your scheduled ultrasound before your follow-up appointment.   Contact information:   [place] [address] [phone]       Follow up with Glendon Axe, MD. Go on 04/22/2016.   Specialty:  Internal Medicine   Why:  Wednesday at 11:00am for hospital follow-up   Contact information:   [place] [facility] [place] [address] [phone]       Discharge Medications  Medication List    TAKE these medications        benzonatate 200 MG capsule  Commonly known as:  TESSALON  Take 200 mg by mouth 3 (three) times daily as needed for cough.     cefUROXime 500 MG tablet  Commonly known as:  CEFTIN  Take 1 tablet (500 mg total) by mouth 2 (two) times daily with a meal.     ciclopirox 8 % solution  Commonly known as:  PENLAC  Apply topically at bedtime. Apply over nail and surrounding skin. Apply daily over previous coat. After seven (7) days, may remove with alcohol and continue cycle.     fenofibrate 145 MG tablet  Commonly known as:  TRICOR  Take 145 mg by mouth daily.     ferrous gluconate 324 MG tablet  Commonly known as:  FERGON  Take 1 tablet by mouth 3 (three) times daily with meals.     fluticasone 50 MCG/ACT nasal spray  Commonly known as:  FLONASE  Place 2 sprays into both  nostrils daily as needed for allergies.     glimepiride 2 MG tablet  Commonly known as:  AMARYL  Take 2 mg by mouth daily with breakfast.     HYDROcodone-acetaminophen 5-325 MG tablet  Commonly known as:  NORCO/VICODIN  Take 1-2 tablets by mouth every 6 (six) hours as needed for moderate pain.     ketorolac 10 MG tablet  Commonly known as:  TORADOL  Take 10 mg by mouth every 6 (six) hours as needed for moderate pain.     levocetirizine 5 MG tablet  Commonly known as:  XYZAL  Take 5 mg by mouth daily.     losartan 25 MG tablet  Commonly known as:  COZAAR  Take 25 mg by mouth daily.     metFORMIN 850 MG tablet  Commonly known as:  GLUCOPHAGE  Take 850 mg by mouth 3 (three) times daily.     multivitamin with minerals Tabs tablet  Take 1 tablet by mouth daily.     pantoprazole 40 MG tablet  Commonly known as:  PROTONIX  Take 40 mg by mouth 2 (two) times daily.     sildenafil 100 MG tablet  Commonly known as:  VIAGRA  Take 100 mg by mouth daily as needed for erectile dysfunction.     tiZANidine 2 MG tablet  Commonly known as:  ZANAFLEX  Take by mouth at bedtime as needed for muscle spasms.     traMADol 50 MG tablet  Commonly known as:  ULTRAM  Take 50 mg by mouth every 6 (six) hours as needed for moderate pain.     vitamin C 500 MG tablet  Commonly known as:  ASCORBIC ACID  Take 500 mg by mouth daily.           Total Time in preparing paper work, data evaluation and todays exam - 35 minutes  Dustin Flock M.D on 04/14/2016 at 12:11 PM  [facility]   Office  [phone]

## 2016-04-14 NOTE — Telephone Encounter (Signed)
Need order for RUS   Jesse Alvarado

## 2016-04-14 NOTE — Telephone Encounter (Signed)
Orders placed.

## 2016-04-14 NOTE — Progress Notes (Addendum)
[place] [facility] was admitted to the Hospital on 04/13/2016 and Discharged  04/14/2016 and should be excused from work/school   for 3 days starting 04/13/2016 , may return to work/school without any restrictions.  Call Dustin Flock MD with questions.  Dustin Flock M.D on 04/14/2016,at 10:08 AM  [place] [facility]  [phone]

## 2016-04-16 ENCOUNTER — Encounter: Payer: Self-pay | Admitting: Urology

## 2016-04-16 ENCOUNTER — Telehealth: Payer: Self-pay

## 2016-04-16 NOTE — Telephone Encounter (Signed)
Pt called stating he has not been able to have a BM since surgery, his urine is a red/dark brown, and he has severe dysuria upon urination. Pt stated that he has been taking stool softners since Monday and no success with a BM. Reinforced with pt to continue to drink plenty of fluids as it will help soften the stool and if does not have a BM today to get some mag citrate. Also reinforced with pt blood in the urine and dysuria with a stent is normal, again to push fluids. Pt voiced understanding of whole conversation.

## 2016-04-17 ENCOUNTER — Telehealth: Payer: Self-pay

## 2016-04-17 DIAGNOSIS — N200 Calculus of kidney: Secondary | ICD-10-CM

## 2016-04-17 MED ORDER — TAMSULOSIN HCL 0.4 MG PO CAPS: 0.4000 mg | ORAL_CAPSULE | Freq: Every day | ORAL | Status: DC

## 2016-04-17 NOTE — Telephone Encounter (Signed)
It is fairly common to have intermittent ureteral spasm for about 24 hours after stent pole. I would recommend taking Motrin along with pain medications and Flomax for about a week. If his pain does not improve significantly, he develops vomiting or fevers, he needs to go to the emergency room.  Hollice Espy, MD

## 2016-04-17 NOTE — Telephone Encounter (Signed)
Spoke with pt in reference to pain after pulling stent. Made pt aware Dr. Erlene Quan stated he should take motrin and pain medication along with flomax. Pt stated he did not have any flomax. Flomax was sent into pt pharmacy. Reinforced with pt should pain not improve significantly or develops n/v, f/c, to go straight to the ER. Pt voiced understanding of whole conversation.

## 2016-04-17 NOTE — Telephone Encounter (Signed)
Pt had a stent placed on Monday by Dr. Junious Silk and he advised the pt to remove stent this morning. Pt has done that at about 10:00. Pt states that he is having severe, 8/10, flank pain and has him doubled over at this point. Pt stated he does not have any medications such as oxybutynin or flomax only pain medication. Please advise.

## 2016-04-18 DIAGNOSIS — G4733 Obstructive sleep apnea (adult) (pediatric): Secondary | ICD-10-CM | POA: Insufficient documentation

## 2016-04-18 DIAGNOSIS — Z9989 Dependence on other enabling machines and devices: Secondary | ICD-10-CM

## 2016-04-21 LAB — STONE ANALYSIS
CA OXALATE, MONOHYDR.: 95 %
Ca phos cry stone ql IR: 5 %
Stone Weight KSTONE: 24 mg

## 2016-04-23 ENCOUNTER — Telehealth: Payer: Self-pay | Admitting: Urology

## 2016-04-23 ENCOUNTER — Other Ambulatory Visit: Payer: Self-pay

## 2016-04-23 DIAGNOSIS — N200 Calculus of kidney: Secondary | ICD-10-CM

## 2016-04-23 MED ORDER — TAMSULOSIN HCL 0.4 MG PO CAPS: 0.4000 mg | ORAL_CAPSULE | Freq: Every day | ORAL | Status: DC

## 2016-04-23 NOTE — Telephone Encounter (Signed)
Pt called expressing concern about blood in urine.  He thinks he may be passing a kidney stone.  Dr. Junious Silk saw him in hospital.  Pt has a new patient appt in our office in July.  Please give pt a call.  He is at work, but has a break at 11:30, or you can leave him a message.

## 2016-04-27 NOTE — Telephone Encounter (Signed)
LMOM

## 2016-04-27 NOTE — Telephone Encounter (Signed)
Spoke with pt in reference to pt having blood in his urine. Pt stated that he did pass a kidney stone over the weekend. Reinforced with pt to keep pushing fluids. Pt voiced understanding.

## 2016-05-08 ENCOUNTER — Ambulatory Visit
Admission: RE | Admit: 2016-05-08 | Discharge: 2016-05-08 | Disposition: A | Discharge status: Home / Self Care | Payer: BLUE CROSS/BLUE SHIELD | Source: Ambulatory Visit | Attending: Urology | Admitting: Urology

## 2016-05-08 DIAGNOSIS — N200 Calculus of kidney: Secondary | ICD-10-CM | POA: Insufficient documentation

## 2016-05-15 ENCOUNTER — Telehealth: Payer: Self-pay

## 2016-05-15 NOTE — Telephone Encounter (Signed)
-----   Message from Festus Aloe, MD sent at 05/12/2016  8:05 PM EDT ----- Notify patient -- renal u/s normal - no blockage of kidneys. Some stones remain in the kidneys. F/u as planned.   ----- Message -----    From: Lestine Box, LPN    Sent: [id number]  11:35 AM      To: Festus Aloe, MD    ----- Message -----    From: Rad Results In Interface    Sent: 05/08/2016   1:19 PM      To: [facility]

## 2016-05-15 NOTE — Telephone Encounter (Signed)
LMOM-most recent imaging is normal. F/u as planned.

## 2016-05-22 ENCOUNTER — Telehealth: Payer: Self-pay | Admitting: Radiology

## 2016-05-22 ENCOUNTER — Encounter: Payer: Self-pay | Admitting: Urology

## 2016-05-22 ENCOUNTER — Ambulatory Visit (INDEPENDENT_AMBULATORY_CARE_PROVIDER_SITE_OTHER): Payer: BLUE CROSS/BLUE SHIELD | Admitting: Urology

## 2016-05-22 VITALS — BP 117/84 | HR 95 | Ht 69.0 in | Wt 183.2 lb

## 2016-05-22 DIAGNOSIS — N200 Calculus of kidney: Secondary | ICD-10-CM | POA: Diagnosis not present

## 2016-05-22 NOTE — Progress Notes (Signed)
05/22/2016 10:13 AM   Jesse Alvarado 1966/11/07 [id number]  Referring provider: Glendon Axe, MD [facility] [place], [place] [address]  Chief Complaint  Patient presents with  . New Patient (Initial Visit)  . Nephrolithiasis    HPI: The patient is a 50 year old gentleman presents today for postoperative follow-up after having a 7 mm left distal ureteral calculus removed emergently for intractable pain ureteroscopy. Patient removed his stent a few days post op. His post instrumentation renal ultrasound shows no hydronephrosis.    He still is residual stone burden bilaterally. He has approximately three 5 mm nonobstructing calculi in the right kidney and two 5 mm nonobstructing calculi in the left kidney.  The stone was 95% calcium oxalate monohydrate and 5% calcium phosphate.   PMH: Past Medical History  Diagnosis Date  . Diabetes mellitus without complication ([place])   . GERD (gastroesophageal reflux disease)   . Hyperlipidemia   . Erectile dysfunction   . Chronic kidney disease     kidney stones  . Hypertension     per patient, he does not have high bp but is treated for his kidneys and his diabetes  . Anemia     taking 3 iron pills a day  . Asthma     sports induced asthma. takes inhalers when needed  . Kidney stone     Surgical History: Past Surgical History  Procedure Laterality Date  . Shoulder surgery Right 2011    tendon was shredded and was trimmed, repaired and reattached. Screws in shoulder  . Prepatellar bursa excision Left 1993    and ostetomy. had at least 5 surgeries in 15 years  . Esophagogastroduodenoscopy (egd) with propofol N/A 12/27/2015    Procedure: ESOPHAGOGASTRODUODENOSCOPY (EGD) WITH PROPOFOL;  Surgeon: Manya Silvas, MD;  Location: [place];  Service: Endoscopy;  Laterality: N/A;  . Knee arthroscopy Right 2015    had bursa sack repaired  . Hernia repair  [id number]    umbilical  . Vasectomy  [id number]  .  Cystoscopy/ureteroscopy/holmium laser/stent placement Left 04/13/2016    Procedure: CYSTOSCOPY/RETROGRADE PYELOGRAM/URETEROSCOPY WITH HOLMIUM LASER LITHOTRIPSY//STENT PLACEMENT;  Surgeon: Festus Aloe, MD;  Location: [facility];  Service: Urology;  Laterality: Left;    Home Medications:    Medication List       This list is accurate as of: 05/22/16 10:13 AM.  Always use your most recent med list.               benzonatate 200 MG capsule  Commonly known as:  TESSALON  Take 200 mg by mouth 3 (three) times daily as needed for cough.     ciclopirox 8 % solution  Commonly known as:  PENLAC  Apply topically at bedtime. Apply over nail and surrounding skin. Apply daily over previous coat. After seven (7) days, may remove with alcohol and continue cycle.     fenofibrate 145 MG tablet  Commonly known as:  TRICOR  Take 145 mg by mouth daily.     ferrous gluconate 324 MG tablet  Commonly known as:  FERGON  Take 1 tablet by mouth 3 (three) times daily with meals.     fluticasone 50 MCG/ACT nasal spray  Commonly known as:  FLONASE  Place 2 sprays into both nostrils daily as needed for allergies.     glimepiride 2 MG tablet  Commonly known as:  AMARYL  Take 2 mg by mouth daily with breakfast.     HYDROcodone-acetaminophen 5-325 MG tablet  Commonly known as:  NORCO/VICODIN  Take 1-2 tablets by mouth every 6 (six) hours as needed for moderate pain.     ketorolac 10 MG tablet  Commonly known as:  TORADOL  Take 10 mg by mouth every 6 (six) hours as needed for moderate pain. Reported on 05/22/2016     levocetirizine 5 MG tablet  Commonly known as:  XYZAL  Take 5 mg by mouth daily.     losartan 25 MG tablet  Commonly known as:  COZAAR  Take 25 mg by mouth daily.     metFORMIN 850 MG tablet  Commonly known as:  GLUCOPHAGE  Take 850 mg by mouth 3 (three) times daily.     multivitamin with minerals Tabs tablet  Take 1 tablet by mouth daily.     pantoprazole 40 MG tablet    Commonly known as:  PROTONIX  Take 40 mg by mouth 2 (two) times daily.     sildenafil 100 MG tablet  Commonly known as:  VIAGRA  Take 100 mg by mouth daily as needed for erectile dysfunction.     tamsulosin 0.4 MG Caps capsule  Commonly known as:  FLOMAX  Take 1 capsule (0.4 mg total) by mouth daily.     tiZANidine 2 MG tablet  Commonly known as:  ZANAFLEX  Take by mouth at bedtime as needed for muscle spasms.     traMADol 50 MG tablet  Commonly known as:  ULTRAM  Take 50 mg by mouth every 6 (six) hours as needed for moderate pain. Reported on 05/22/2016     vitamin C 500 MG tablet  Commonly known as:  ASCORBIC ACID  Take 500 mg by mouth daily.        Allergies:  Allergies  Allergen Reactions  . Ciprofloxacin Nausea Only    Family History: Family History  Problem Relation Age of Onset  . Urolithiasis Father   . Kidney disease Father   . Prostate cancer Neg Hx   . Kidney cancer Neg Hx     Social History:  reports that he has never smoked. He has never used smokeless tobacco. He reports that he does not drink alcohol or use illicit drugs.  ROS: UROLOGY Frequent Urination?: No Hard to postpone urination?: No Burning/pain with urination?: No Get up at night to urinate?: No Leakage of urine?: No Urine stream starts and stops?: No Trouble starting stream?: No Do you have to strain to urinate?: No Blood in urine?: No Urinary tract infection?: No Sexually transmitted disease?: No Injury to kidneys or bladder?: No Painful intercourse?: No Weak stream?: No Erection problems?: Yes Penile pain?: No  Gastrointestinal Nausea?: No Vomiting?: No Indigestion/heartburn?: Yes Diarrhea?: No Constipation?: No  Constitutional Fever: No Night sweats?: No Weight loss?: No Fatigue?: No  Skin Skin rash/lesions?: No Itching?: No  Eyes Blurred vision?: No Double vision?: No  Ears/Nose/Throat Sore throat?: No Sinus problems?:  Yes  Hematologic/Lymphatic Swollen glands?: No Easy bruising?: No  Cardiovascular Leg swelling?: No Chest pain?: No  Respiratory Cough?: Yes Shortness of breath?: No  Endocrine Excessive thirst?: No  Musculoskeletal Back pain?: Yes Joint pain?: Yes  Neurological Headaches?: No Dizziness?: No  Psychologic Depression?: No Anxiety?: No  Physical Exam: BP 117/84 mmHg  Pulse 95  Ht 5\' 9"  (1.753 m)  Wt 183 lb 3.2 oz (83.099 kg)  BMI 27.04 kg/m2  Constitutional:  Alert and oriented, No acute distress. HEENT: [place] AT, moist mucus membranes.  Trachea midline, no masses. Cardiovascular: No clubbing, cyanosis, or  edema. Respiratory: Normal respiratory effort, no increased work of breathing. GI: Abdomen is soft, nontender, nondistended, no abdominal masses GU: No CVA tenderness.  Skin: No rashes, bruises or suspicious lesions. Lymph: No cervical or inguinal adenopathy. Neurologic: Grossly intact, no focal deficits, moving all 4 extremities. Psychiatric: Normal mood and affect.  Laboratory Data: Lab Results  Component Value Date   WBC 10.9* 04/13/2016   HGB 13.7 04/13/2016   HCT 40.5 04/13/2016   MCV 86.9 04/13/2016   PLT 367 04/13/2016    Lab Results  Component Value Date   CREATININE 1.25* 04/14/2016    No results found for: PSA  No results found for: TESTOSTERONE  Lab Results  Component Value Date   HGBA1C 7.6* 04/13/2016    Urinalysis    Component Value Date/Time   COLORURINE YELLOW* 04/13/2016 0956   APPEARANCEUR CLOUDY* 04/13/2016 0956   LABSPEC 1.017 04/13/2016 0956   PHURINE 5.0 04/13/2016 0956   GLUCOSEU NEGATIVE 04/13/2016 0956   HGBUR 3+* 04/13/2016 0956   BILIRUBINUR NEGATIVE 04/13/2016 0956   KETONESUR NEGATIVE 04/13/2016 0956   PROTEINUR 30* 04/13/2016 0956   NITRITE NEGATIVE 04/13/2016 0956   LEUKOCYTESUR NEGATIVE 04/13/2016 0956    Pertinent Imaging: CLINICAL DATA: Kidney stones.  EXAM: RENAL / URINARY TRACT ULTRASOUND  COMPLETE  COMPARISON: CT 04/13/2016.  FINDINGS: Right Kidney:  Length: 11.7 cm. Echogenicity within normal limits. No mass or hydronephrosis visualized. Tiny nonobstructing calyceal stones, largest measures 8 mm.  Left Kidney:  Length: 12.2 cm. Echogenicity within normal limits. No mass or hydronephrosis visualized. Tiny nonobstructing calyceal stones, the largest measures 5 mm.  Bladder:  Appears normal for degree of bladder distention. Bilateral ureteral jets noted.  IMPRESSION: Bilateral small nonobstructing renal calyceal stones again noted. No hydronephrosis.  Assessment & Plan:    1. Bilateral Nephrolithiasis I had a long discussion with the patient regarding his residual bilateral nephrolithiasis. We discussed treatment options include watchful waiting, ureteroscopy, and lithotripsy. He is not interested watchful waiting as he does not want to experience the pain of passing a stone. I do not that he is a good candidate for lithotripsy as he has multiple stones that this would take multiple procedures. His best chance of being stone free was with bilateral ureteroscopy. We discussed the risks, benefits, indications of this procedure. We discussed the risks including but not limited to bleeding, infection, and ureteral injury. He also understands it may be a need for repeat procedure this stones are accessible in the first attempt. All questions were answered.  We also had a long discussion regarding nephrolithiasis. We discussed the ABCs of stone prevention in great detail. He was given a booklet. He may benefit from a 24 urinalysis after we clear stones.  We will send his urine for culture today in anticipation of his planned surgery.    Nickie Retort, MD  [facility] [address] [place], [place] [address] [phone]

## 2016-05-22 NOTE — Telephone Encounter (Signed)
Notified pt of surgery scheduled 06/05/16, pre-admit testing appt on 05/26/16 @7 :30 & to call day prior to surgery for arrival time to SDS. Pt voices understanding.

## 2016-05-22 NOTE — Telephone Encounter (Signed)
LMOM. Need to notify pt of surgery information. 

## 2016-05-26 ENCOUNTER — Ambulatory Visit: Payer: Self-pay

## 2016-05-26 ENCOUNTER — Encounter
Admission: RE | Admit: 2016-05-26 | Discharge: 2016-05-26 | Disposition: A | Discharge status: Home / Self Care | Payer: BLUE CROSS/BLUE SHIELD | Source: Ambulatory Visit | Attending: Urology | Admitting: Urology

## 2016-05-26 DIAGNOSIS — Z01812 Encounter for preprocedural laboratory examination: Secondary | ICD-10-CM | POA: Insufficient documentation

## 2016-05-26 HISTORY — DX: Malignant (primary) neoplasm, unspecified: C80.1

## 2016-05-26 HISTORY — DX: Diverticulosis of intestine, part unspecified, without perforation or abscess without bleeding: K57.90

## 2016-05-26 HISTORY — DX: Unspecified osteoarthritis, unspecified site: M19.90

## 2016-05-26 HISTORY — DX: Sleep apnea, unspecified: G47.30

## 2016-05-26 LAB — CBC
HEMATOCRIT: 36.2 % — AB (ref 40.0–52.0)
Hemoglobin: 12.5 g/dL — ABNORMAL LOW (ref 13.0–18.0)
MCH: 30.5 pg (ref 26.0–34.0)
MCHC: 34.7 g/dL (ref 32.0–36.0)
MCV: 87.8 fL (ref 80.0–100.0)
PLATELETS: 287 10*3/uL (ref 150–440)
RBC: 4.12 MIL/uL — AB (ref 4.40–5.90)
RDW: 14 % (ref 11.5–14.5)
WBC: 6.2 10*3/uL (ref 3.8–10.6)

## 2016-05-26 LAB — BASIC METABOLIC PANEL
ANION GAP: 5 (ref 5–15)
BUN: 22 mg/dL — ABNORMAL HIGH (ref 6–20)
CALCIUM: 9 mg/dL (ref 8.9–10.3)
CHLORIDE: 105 mmol/L (ref 101–111)
CO2: 26 mmol/L (ref 22–32)
CREATININE: 1.15 mg/dL (ref 0.61–1.24)
GFR calc non Af Amer: >60 mL/min (ref >60)
Glucose, Bld: 134 mg/dL — ABNORMAL HIGH (ref 65–99)
Potassium: 4.5 mmol/L (ref 3.5–5.1)
SODIUM: 136 mmol/L (ref 135–145)

## 2016-05-26 LAB — DIFFERENTIAL
BASOS ABS: 0.1 10*3/uL (ref 0–0.1)
BASOS PCT: 1 %
EOS ABS: 0.1 10*3/uL (ref 0–0.7)
EOS PCT: 2 %
Lymphocytes Relative: 35 %
Lymphs Abs: 2.2 10*3/uL (ref 1.0–3.6)
Monocytes Absolute: 0.6 10*3/uL (ref 0.2–1.0)
Monocytes Relative: 9 %
NEUTROS PCT: 53 %
Neutro Abs: 3.3 10*3/uL (ref 1.4–6.5)

## 2016-05-26 NOTE — Pre-Procedure Instructions (Signed)
EKG FROM 04/13/16 NOTED AND CYSTOSCOPY DONE  04/14/16

## 2016-05-26 NOTE — Pre-Procedure Instructions (Signed)
EKG reviewed by Kerin Perna, RN (anesthesia nurse) and stated "should be ok."

## 2016-05-26 NOTE — Patient Instructions (Signed)
  Your procedure is scheduled [id number] 28, 2017 (Friday) Report to Same Day Surgery 2nd floorMedical Jesse Alvarado To find out your arrival time please call [phone] between 1PM - 3PM on June 04, 2016 (Thursday)  Remember: Instructions that are not followed completely may result in serious medical risk, up to and including death, or upon the discretion of your surgeon and anesthesiologist your surgery may need to be rescheduled.    _x___ 1. Do not eat food or drink liquids after midnight. No gum chewing or hard candies.     _x___ 2. No Alcohol for 24 hours before or after surgery.   _x__3. No Smoking for 24 prior to surgery.   ____  4. Bring all medications with you on the day of surgery if instructed.    __x__ 5. Notify your doctor if there is any change in your medical condition     (cold, fever, infections).     Do not wear jewelry, make-up, hairpins, clips or nail polish.  Do not wear lotions, powders, or perfumes. You may wear deodorant.  Do not shave 48 hours prior to surgery. Men may shave face and neck.  Do not bring valuables to the hospital.    [facility] is not responsible for any belongings or valuables.               Contacts, dentures or bridgework may not be worn into surgery.  Leave your suitcase in the car. After surgery it may be brought to your room.  For patients admitted to the hospital, discharge time is determined by your treatment team.   Patients discharged the day of surgery will not be allowed to drive home.    Please read over the following fact sheets that you were given:   [facility] Preparing for Surgery and or MRSA Information   _x___ Take these medicines the morning of surgery with A SIP OF WATER:    1. Losartan  2.Fenofibrate  3.Pantoprazole  4.  5.  6.  ____ Fleet Enema (as directed)   ___ Use CHG Soap or sage wipes as directed on instruction sheet   ____ Use inhalers on the day of surgery and bring to hospital day of surgery  _x___ Stop  metformin 2 days prior to surgery (STOP METFORMIN ON JULY  26)    ____ Take 1/2 of usual insulin dose the night before surgery and none on the morning of  surgery            __x__ Stop aspirin or coumadin, or plavix (N/A)  _x__ Stop Anti-inflammatories such as Advil, Aleve, Ibuprofen, Motrin, Naproxen,          Naprosyn, Goodies powders or aspirin products. Ok to take Tylenol.   __x__ Stop supplements until after surgery.  (Stop Vitamin C now)  __x__ Bring C-Pap to the hospital.

## 2016-05-27 LAB — CULTURE, URINE COMPREHENSIVE

## 2016-05-28 ENCOUNTER — Other Ambulatory Visit: Payer: Self-pay

## 2016-05-28 ENCOUNTER — Telehealth: Payer: Self-pay | Admitting: Radiology

## 2016-05-28 DIAGNOSIS — N390 Urinary tract infection, site not specified: Secondary | ICD-10-CM

## 2016-05-28 MED ORDER — NITROFURANTOIN MONOHYD MACRO 100 MG PO CAPS
100.0000 mg | ORAL_CAPSULE | Freq: Two times a day (BID) | ORAL | Status: DC
Start: 2016-05-28 — End: 2016-06-05

## 2016-05-28 NOTE — Telephone Encounter (Signed)
Pt is allergic to ciprofloxacin. Per Dr Pilar Jarvis, prescription changed to [facility] 100mg  po bid x 7 days.  LMOM for pt that prescription was being sent to pharmacy for +ucx & that ucx needs to be repeated prior to surgery.

## 2016-05-28 NOTE — Telephone Encounter (Signed)
sw pt regarding prescription & appt made for repeat ucx. Pt voices understanding.

## 2016-05-28 NOTE — Telephone Encounter (Signed)
-----   Message from Nickie Retort, MD sent at 05/28/2016  9:50 AM EDT ----- Patient needs to be started on cipro 500 mg BID for a week and re-cultured prior to surgery. Thanks,

## 2016-06-01 ENCOUNTER — Other Ambulatory Visit: Payer: Self-pay

## 2016-06-01 DIAGNOSIS — N200 Calculus of kidney: Secondary | ICD-10-CM

## 2016-06-02 ENCOUNTER — Other Ambulatory Visit: Payer: BLUE CROSS/BLUE SHIELD

## 2016-06-02 DIAGNOSIS — N200 Calculus of kidney: Secondary | ICD-10-CM

## 2016-06-02 LAB — URINALYSIS, COMPLETE
Bilirubin, UA: NEGATIVE
Ketones, UA: NEGATIVE
Leukocytes, UA: NEGATIVE
NITRITE UA: NEGATIVE
Protein, UA: NEGATIVE
RBC, UA: NEGATIVE
Specific Gravity, UA: 1.02 (ref 1.005–1.030)
Urobilinogen, Ur: 0.2 mg/dL (ref 0.2–1.0)
pH, UA: 5 (ref 5.0–7.5)

## 2016-06-02 LAB — MICROSCOPIC EXAMINATION: Bacteria, UA: NONE SEEN

## 2016-06-04 ENCOUNTER — Encounter: Payer: Self-pay | Admitting: *Deleted

## 2016-06-04 MED ORDER — LACTATED RINGERS IV SOLN: INTRAVENOUS | Status: DC

## 2016-06-05 ENCOUNTER — Ambulatory Visit
Admission: RE | Admit: 2016-06-05 | Discharge: 2016-06-05 | Disposition: A | Discharge status: Home / Self Care | Payer: BLUE CROSS/BLUE SHIELD | Source: Ambulatory Visit | Attending: Urology | Admitting: Urology

## 2016-06-05 ENCOUNTER — Encounter
Admission: RE | Disposition: A | Discharge status: Home / Self Care | Payer: Self-pay | Source: Ambulatory Visit | Attending: Urology

## 2016-06-05 ENCOUNTER — Ambulatory Visit: Payer: BLUE CROSS/BLUE SHIELD | Admitting: Anesthesiology

## 2016-06-05 DIAGNOSIS — Z7951 Long term (current) use of inhaled steroids: Secondary | ICD-10-CM | POA: Insufficient documentation

## 2016-06-05 DIAGNOSIS — N390 Urinary tract infection, site not specified: Secondary | ICD-10-CM

## 2016-06-05 DIAGNOSIS — D649 Anemia, unspecified: Secondary | ICD-10-CM | POA: Diagnosis not present

## 2016-06-05 DIAGNOSIS — E785 Hyperlipidemia, unspecified: Secondary | ICD-10-CM | POA: Diagnosis not present

## 2016-06-05 DIAGNOSIS — N200 Calculus of kidney: Secondary | ICD-10-CM | POA: Insufficient documentation

## 2016-06-05 DIAGNOSIS — K219 Gastro-esophageal reflux disease without esophagitis: Secondary | ICD-10-CM | POA: Insufficient documentation

## 2016-06-05 DIAGNOSIS — Z79891 Long term (current) use of opiate analgesic: Secondary | ICD-10-CM | POA: Insufficient documentation

## 2016-06-05 DIAGNOSIS — Z9852 Vasectomy status: Secondary | ICD-10-CM | POA: Insufficient documentation

## 2016-06-05 DIAGNOSIS — Z9889 Other specified postprocedural states: Secondary | ICD-10-CM | POA: Diagnosis not present

## 2016-06-05 DIAGNOSIS — I129 Hypertensive chronic kidney disease with stage 1 through stage 4 chronic kidney disease, or unspecified chronic kidney disease: Secondary | ICD-10-CM | POA: Diagnosis not present

## 2016-06-05 DIAGNOSIS — Z841 Family history of disorders of kidney and ureter: Secondary | ICD-10-CM | POA: Insufficient documentation

## 2016-06-05 DIAGNOSIS — Z87442 Personal history of urinary calculi: Secondary | ICD-10-CM | POA: Diagnosis not present

## 2016-06-05 DIAGNOSIS — Z7984 Long term (current) use of oral hypoglycemic drugs: Secondary | ICD-10-CM | POA: Diagnosis not present

## 2016-06-05 DIAGNOSIS — E1122 Type 2 diabetes mellitus with diabetic chronic kidney disease: Secondary | ICD-10-CM | POA: Insufficient documentation

## 2016-06-05 DIAGNOSIS — Z79899 Other long term (current) drug therapy: Secondary | ICD-10-CM | POA: Insufficient documentation

## 2016-06-05 DIAGNOSIS — J45990 Exercise induced bronchospasm: Secondary | ICD-10-CM | POA: Diagnosis not present

## 2016-06-05 DIAGNOSIS — Z881 Allergy status to other antibiotic agents status: Secondary | ICD-10-CM | POA: Insufficient documentation

## 2016-06-05 DIAGNOSIS — N189 Chronic kidney disease, unspecified: Secondary | ICD-10-CM | POA: Insufficient documentation

## 2016-06-05 HISTORY — PX: URETEROSCOPY WITH HOLMIUM LASER LITHOTRIPSY: SHX6645

## 2016-06-05 HISTORY — PX: CYSTOSCOPY WITH STENT PLACEMENT: SHX5790

## 2016-06-05 LAB — CULTURE, URINE COMPREHENSIVE

## 2016-06-05 LAB — GLUCOSE, CAPILLARY
GLUCOSE-CAPILLARY: 145 mg/dL — AB (ref 65–99)
Glucose-Capillary: 124 mg/dL — ABNORMAL HIGH (ref 65–99)

## 2016-06-05 SURGERY — URETEROSCOPY, WITH LITHOTRIPSY USING HOLMIUM LASER
Anesthesia: General | Laterality: Bilateral | Wound class: Clean Contaminated

## 2016-06-05 MED ORDER — NITROFURANTOIN MONOHYD MACRO 100 MG PO CAPS: 100.0000 mg | ORAL_CAPSULE | Freq: Two times a day (BID) | ORAL | 0 refills | Status: DC

## 2016-06-05 MED ORDER — PROPOFOL 10 MG/ML IV BOLUS
INTRAVENOUS | Status: DC | PRN
Start: 2016-06-05 — End: 2016-06-05
  Administered 2016-06-05: 20 mg via INTRAVENOUS
  Administered 2016-06-05: 180 mg via INTRAVENOUS

## 2016-06-05 MED ORDER — SUCCINYLCHOLINE CHLORIDE 20 MG/ML IJ SOLN
INTRAMUSCULAR | Status: DC | PRN
  Administered 2016-06-05: 140 mg via INTRAVENOUS

## 2016-06-05 MED ORDER — FENTANYL CITRATE (PF) 100 MCG/2ML IJ SOLN
25.0000 ug | INTRAMUSCULAR | Status: DC | PRN
  Administered 2016-06-05 (×4): 25 ug via INTRAVENOUS

## 2016-06-05 MED ORDER — HYDROCODONE-ACETAMINOPHEN 7.5-325 MG PO TABS: 1.0000 | ORAL_TABLET | ORAL | Status: DC | PRN

## 2016-06-05 MED ORDER — LIDOCAINE HCL (CARDIAC) 20 MG/ML IV SOLN
INTRAVENOUS | Status: DC | PRN
  Administered 2016-06-05: 40 mg via INTRAVENOUS

## 2016-06-05 MED ORDER — KETOROLAC TROMETHAMINE 30 MG/ML IJ SOLN
INTRAMUSCULAR | Status: DC | PRN
  Administered 2016-06-05: 30 mg via INTRAVENOUS

## 2016-06-05 MED ORDER — SODIUM CHLORIDE 0.9 % IV SOLN
2.0000 g | INTRAVENOUS | Status: AC
  Administered 2016-06-05: 2 g via INTRAVENOUS
  Filled 2016-06-05: qty 2000

## 2016-06-05 MED ORDER — MIDAZOLAM HCL 2 MG/2ML IJ SOLN
INTRAMUSCULAR | Status: DC | PRN
  Administered 2016-06-05: 2 mg via INTRAVENOUS

## 2016-06-05 MED ORDER — SODIUM CHLORIDE 0.9 % IV SOLN
INTRAVENOUS | Status: DC
  Administered 2016-06-05: 11:00:00 via INTRAVENOUS

## 2016-06-05 MED ORDER — IOTHALAMATE MEGLUMINE 60 % INJ SOLN
INTRAMUSCULAR | Status: DC | PRN
  Administered 2016-06-05: 30 mL

## 2016-06-05 MED ORDER — CEFAZOLIN SODIUM-DEXTROSE 2-4 GM/100ML-% IV SOLN: 2.0000 g | Freq: Once | INTRAVENOUS | Status: DC

## 2016-06-05 MED ORDER — SODIUM CHLORIDE 0.9 % IV SOLN
INTRAVENOUS | Status: DC
  Administered 2016-06-05: 12:00:00 via INTRAVENOUS

## 2016-06-05 MED ORDER — HYDROCODONE-ACETAMINOPHEN 5-325 MG PO TABS
ORAL_TABLET | ORAL | Status: AC
  Administered 2016-06-05: 1
  Filled 2016-06-05: qty 1

## 2016-06-05 MED ORDER — ONDANSETRON HCL 4 MG/2ML IJ SOLN
INTRAMUSCULAR | Status: DC | PRN
  Administered 2016-06-05: 4 mg via INTRAVENOUS

## 2016-06-05 MED ORDER — ONDANSETRON HCL 4 MG/2ML IJ SOLN: 4.0000 mg | Freq: Once | INTRAMUSCULAR | Status: DC | PRN

## 2016-06-05 MED ORDER — FENTANYL CITRATE (PF) 100 MCG/2ML IJ SOLN
INTRAMUSCULAR | Status: DC | PRN
  Administered 2016-06-05: 25 ug via INTRAVENOUS
  Administered 2016-06-05: 50 ug via INTRAVENOUS
  Administered 2016-06-05: 25 ug via INTRAVENOUS

## 2016-06-05 MED ORDER — CEFAZOLIN SODIUM-DEXTROSE 2-4 GM/100ML-% IV SOLN
INTRAVENOUS | Status: AC
  Filled 2016-06-05: qty 100

## 2016-06-05 MED ORDER — HYDROCODONE-ACETAMINOPHEN 5-325 MG PO TABS: 1.0000 | ORAL_TABLET | ORAL | 0 refills | Status: DC | PRN

## 2016-06-05 MED ORDER — FENTANYL CITRATE (PF) 100 MCG/2ML IJ SOLN
INTRAMUSCULAR | Status: AC
  Administered 2016-06-05: 25 ug via INTRAVENOUS
  Filled 2016-06-05: qty 2

## 2016-06-05 MED ORDER — ROCURONIUM BROMIDE 100 MG/10ML IV SOLN
INTRAVENOUS | Status: DC | PRN
  Administered 2016-06-05: 35 mg via INTRAVENOUS

## 2016-06-05 MED ORDER — GENTAMICIN IN SALINE 1.6-0.9 MG/ML-% IV SOLN
80.0000 mg | INTRAVENOUS | Status: AC
  Administered 2016-06-05: 80 mg via INTRAVENOUS
  Filled 2016-06-05: qty 50

## 2016-06-05 MED ORDER — LACTATED RINGERS IV SOLN: INTRAVENOUS | Status: DC | PRN

## 2016-06-05 SURGICAL SUPPLY — 30 items
BACTOSHIELD CHG 4% 4OZ (MISCELLANEOUS) ×1
BASKET ZERO TIP 1.9FR (BASKET) ×2 IMPLANT
CATH URETERAL DUAL LUMEN 10F (MISCELLANEOUS) ×2 IMPLANT
CATH URETL 5X70 OPEN END (CATHETERS) ×2 IMPLANT
CNTNR SPEC 2.5X3XGRAD LEK (MISCELLANEOUS) ×1
CONT SPEC 4OZ STER OR WHT (MISCELLANEOUS) ×1
CONTAINER SPEC 2.5X3XGRAD LEK (MISCELLANEOUS) ×1 IMPLANT
FIBER LASER LITHO 273 (Laser) IMPLANT
GLOVE BIO SURGEON STRL SZ7 (GLOVE) ×2 IMPLANT
GLOVE BIO SURGEON STRL SZ7.5 (GLOVE) ×2 IMPLANT
GOWN STRL REUS W/ TWL LRG LVL4 (GOWN DISPOSABLE) ×1 IMPLANT
GOWN STRL REUS W/TWL LRG LVL4 (GOWN DISPOSABLE) ×1
GOWN STRL REUS W/TWL XL LVL3 (GOWN DISPOSABLE) ×2 IMPLANT
GUIDEWIRE SUPER STIFF (WIRE) IMPLANT
INTRODUCER DILATOR DOUBLE (INTRODUCER) IMPLANT
KIT RM TURNOVER CYSTO AR (KITS) ×2 IMPLANT
LASER FIBER 200M SMARTSCOPE (Laser) ×2 IMPLANT
PACK CYSTO AR (MISCELLANEOUS) ×2 IMPLANT
SCRUB CHG 4% DYNA-HEX 4OZ (MISCELLANEOUS) ×1 IMPLANT
SENSORWIRE 0.038 NOT ANGLED (WIRE) ×2
SET CYSTO W/LG BORE CLAMP LF (SET/KITS/TRAYS/PACK) ×2 IMPLANT
SHEATH URETERAL 13/15X36 1L (SHEATH) ×2 IMPLANT
SOL .9 NS 3000ML IRR  AL (IV SOLUTION) ×1
SOL .9 NS 3000ML IRR UROMATIC (IV SOLUTION) ×1 IMPLANT
STENT URET 6FRX24 CONTOUR (STENTS) IMPLANT
STENT URET 6FRX26 CONTOUR (STENTS) ×4 IMPLANT
SURGILUBE 2OZ TUBE FLIPTOP (MISCELLANEOUS) ×2 IMPLANT
SYRINGE IRR TOOMEY STRL 70CC (SYRINGE) ×2 IMPLANT
WATER STERILE IRR 1000ML POUR (IV SOLUTION) ×2 IMPLANT
WIRE SENSOR 0.038 NOT ANGLED (WIRE) ×1 IMPLANT

## 2016-06-05 NOTE — Discharge Instructions (Signed)
AMBULATORY SURGERY  DISCHARGE INSTRUCTIONS   1) The drugs that you were given will stay in your system until tomorrow so for the next 24 hours you should not:  A) Drive an automobile B) Make any legal decisions C) Drink any alcoholic beverage   2) You may resume regular meals tomorrow.  Today it is better to start with liquids and gradually work up to solid foods.  You may eat anything you prefer, but it is better to start with liquids, then soup and crackers, and gradually work up to solid foods.   3) Please notify your doctor immediately if you have any unusual bleeding, trouble breathing, redness and pain at the surgery site, drainage, fever, or pain not relieved by medication.    Please contact your physician with any problems or Same Day Surgery at [phone], Monday through Friday 6 am to 4 pm, or [place] [facility] number at [phone].  Cystoscopy, Care After Refer to this sheet in the next few weeks. These instructions provide you with information on caring for yourself after your procedure. Your caregiver may also give you more specific instructions. Your treatment has been planned according to current medical practices, but problems sometimes occur. Call your caregiver if you have any problems or questions after your procedure. HOME CARE INSTRUCTIONS  Things you can do to ease any discomfort after your procedure include:  Drinking enough water and fluids to keep your urine clear or pale yellow.  Taking a warm bath to relieve any burning feelings. SEEK IMMEDIATE MEDICAL CARE IF:   You have an increase in blood in your urine.  You notice blood clots in your urine.  You have difficulty passing urine.  You have the chills.  You have abdominal pain.  You have a fever or persistent symptoms for more than 2-3 days.  You have a fever and your symptoms suddenly get worse. MAKE SURE YOU:   Understand these instructions.  Will watch your  condition.  Will get help right away if you are not doing well or get worse.   This information is not intended to replace advice given to you by your health care provider. Make sure you discuss any questions you have with your health care provider.   Document Released: 05/15/2005 Document Revised: 11/16/2014 Document Reviewed: 04/18/2012 Elsevier Interactive Patient Education [employer].

## 2016-06-05 NOTE — Anesthesia Procedure Notes (Signed)
Procedure Name: Intubation Date/Time: 06/05/2016 12:10 PM Performed by: Allean Found Pre-anesthesia Checklist: Patient identified, Emergency Drugs available, Suction available, Patient being monitored and Timeout performed Patient Re-evaluated:Patient Re-evaluated prior to inductionOxygen Delivery Method: Circle system utilized Preoxygenation: Pre-oxygenation with 100% oxygen Intubation Type: IV induction Ventilation: Mask ventilation without difficulty Laryngoscope Size: Mac and 3 Grade View: Grade II Tube type: Oral Tube size: 7.0 mm Number of attempts: 1 Airway Equipment and Method: Stylet Placement Confirmation: ETT inserted through vocal cords under direct vision,  positive ETCO2 and breath sounds checked- equal and bilateral Secured at: 21 cm Tube secured with: Tape Dental Injury: Teeth and Oropharynx as per pre-operative assessment  Difficulty Due To: Difficult Airway- due to anterior larynx and Difficult Airway- due to limited oral opening Future Recommendations: Recommend- induction with short-acting agent, and alternative techniques readily available

## 2016-06-05 NOTE — Transfer of Care (Signed)
Immediate Anesthesia Transfer of Care Note  Patient: Jesse Alvarado  Procedure(s) Performed: Procedure(s): URETEROSCOPY WITH HOLMIUM LASER LITHOTRIPSY (Bilateral) CYSTOSCOPY WITH STENT PLACEMENT (Bilateral)  Patient Location: PACU  Anesthesia Type:General  Level of Consciousness: sedated  Airway & Oxygen Therapy: Patient Spontanous Breathing and Patient connected to face mask oxygen  Post-op Assessment: Report given to RN and Post -op Vital signs reviewed and stable  Post vital signs: Reviewed and stable  Last Vitals:  Vitals:   06/05/16 1055 06/05/16 1328  BP: 126/82 125/78  Pulse: 85 80  Resp: 16 18  Temp: (!) 35.7 C 36.6 C    Last Pain:  Vitals:   06/05/16 1055  TempSrc: Tympanic         Complications: No apparent anesthesia complications

## 2016-06-05 NOTE — H&P (View-Only) (Signed)
05/22/2016 10:13 AM   Jesse Alvarado 05/19/1966 [id number]  Referring provider: Glendon Axe, MD [facility] [place], [place] [address]  Chief Complaint  Patient presents with  . New Patient (Initial Visit)  . Nephrolithiasis    HPI: The patient is a 50 year old gentleman presents today for postoperative follow-up after having a 7 mm left distal ureteral calculus removed emergently for intractable pain ureteroscopy. Patient removed his stent a few days post op. His post instrumentation renal ultrasound shows no hydronephrosis.    He still is residual stone burden bilaterally. He has approximately three 5 mm nonobstructing calculi in the right kidney and two 5 mm nonobstructing calculi in the left kidney.  The stone was 95% calcium oxalate monohydrate and 5% calcium phosphate.   PMH: Past Medical History  Diagnosis Date  . Diabetes mellitus without complication ([place])   . GERD (gastroesophageal reflux disease)   . Hyperlipidemia   . Erectile dysfunction   . Chronic kidney disease     kidney stones  . Hypertension     per patient, he does not have high bp but is treated for his kidneys and his diabetes  . Anemia     taking 3 iron pills a day  . Asthma     sports induced asthma. takes inhalers when needed  . Kidney stone     Surgical History: Past Surgical History  Procedure Laterality Date  . Shoulder surgery Right 2011    tendon was shredded and was trimmed, repaired and reattached. Screws in shoulder  . Prepatellar bursa excision Left 1993    and ostetomy. had at least 5 surgeries in 15 years  . Esophagogastroduodenoscopy (egd) with propofol N/A 12/27/2015    Procedure: ESOPHAGOGASTRODUODENOSCOPY (EGD) WITH PROPOFOL;  Surgeon: Manya Silvas, MD;  Location: [facility];  Service: Endoscopy;  Laterality: N/A;  . Knee arthroscopy Right 2015    had bursa sack repaired  . Hernia repair  [id number]    umbilical  . Vasectomy  [id number]  .  Cystoscopy/ureteroscopy/holmium laser/stent placement Left 04/13/2016    Procedure: CYSTOSCOPY/RETROGRADE PYELOGRAM/URETEROSCOPY WITH HOLMIUM LASER LITHOTRIPSY//STENT PLACEMENT;  Surgeon: Festus Aloe, MD;  Location: [facility];  Service: Urology;  Laterality: Left;    Home Medications:    Medication List       This list is accurate as of: 05/22/16 10:13 AM.  Always use your most recent med list.               benzonatate 200 MG capsule  Commonly known as:  TESSALON  Take 200 mg by mouth 3 (three) times daily as needed for cough.     ciclopirox 8 % solution  Commonly known as:  PENLAC  Apply topically at bedtime. Apply over nail and surrounding skin. Apply daily over previous coat. After seven (7) days, may remove with alcohol and continue cycle.     fenofibrate 145 MG tablet  Commonly known as:  TRICOR  Take 145 mg by mouth daily.     ferrous gluconate 324 MG tablet  Commonly known as:  FERGON  Take 1 tablet by mouth 3 (three) times daily with meals.     fluticasone 50 MCG/ACT nasal spray  Commonly known as:  FLONASE  Place 2 sprays into both nostrils daily as needed for allergies.     glimepiride 2 MG tablet  Commonly known as:  AMARYL  Take 2 mg by mouth daily with breakfast.     HYDROcodone-acetaminophen 5-325 MG tablet  Commonly known as:  NORCO/VICODIN  Take 1-2 tablets by mouth every 6 (six) hours as needed for moderate pain.     ketorolac 10 MG tablet  Commonly known as:  TORADOL  Take 10 mg by mouth every 6 (six) hours as needed for moderate pain. Reported on 05/22/2016     levocetirizine 5 MG tablet  Commonly known as:  XYZAL  Take 5 mg by mouth daily.     losartan 25 MG tablet  Commonly known as:  COZAAR  Take 25 mg by mouth daily.     metFORMIN 850 MG tablet  Commonly known as:  GLUCOPHAGE  Take 850 mg by mouth 3 (three) times daily.     multivitamin with minerals Tabs tablet  Take 1 tablet by mouth daily.     pantoprazole 40 MG tablet    Commonly known as:  PROTONIX  Take 40 mg by mouth 2 (two) times daily.     sildenafil 100 MG tablet  Commonly known as:  VIAGRA  Take 100 mg by mouth daily as needed for erectile dysfunction.     tamsulosin 0.4 MG Caps capsule  Commonly known as:  FLOMAX  Take 1 capsule (0.4 mg total) by mouth daily.     tiZANidine 2 MG tablet  Commonly known as:  ZANAFLEX  Take by mouth at bedtime as needed for muscle spasms.     traMADol 50 MG tablet  Commonly known as:  ULTRAM  Take 50 mg by mouth every 6 (six) hours as needed for moderate pain. Reported on 05/22/2016     vitamin C 500 MG tablet  Commonly known as:  ASCORBIC ACID  Take 500 mg by mouth daily.        Allergies:  Allergies  Allergen Reactions  . Ciprofloxacin Nausea Only    Family History: Family History  Problem Relation Age of Onset  . Urolithiasis Father   . Kidney disease Father   . Prostate cancer Neg Hx   . Kidney cancer Neg Hx     Social History:  reports that he has never smoked. He has never used smokeless tobacco. He reports that he does not drink alcohol or use illicit drugs.  ROS: UROLOGY Frequent Urination?: No Hard to postpone urination?: No Burning/pain with urination?: No Get up at night to urinate?: No Leakage of urine?: No Urine stream starts and stops?: No Trouble starting stream?: No Do you have to strain to urinate?: No Blood in urine?: No Urinary tract infection?: No Sexually transmitted disease?: No Injury to kidneys or bladder?: No Painful intercourse?: No Weak stream?: No Erection problems?: Yes Penile pain?: No  Gastrointestinal Nausea?: No Vomiting?: No Indigestion/heartburn?: Yes Diarrhea?: No Constipation?: No  Constitutional Fever: No Night sweats?: No Weight loss?: No Fatigue?: No  Skin Skin rash/lesions?: No Itching?: No  Eyes Blurred vision?: No Double vision?: No  Ears/Nose/Throat Sore throat?: No Sinus problems?:  Yes  Hematologic/Lymphatic Swollen glands?: No Easy bruising?: No  Cardiovascular Leg swelling?: No Chest pain?: No  Respiratory Cough?: Yes Shortness of breath?: No  Endocrine Excessive thirst?: No  Musculoskeletal Back pain?: Yes Joint pain?: Yes  Neurological Headaches?: No Dizziness?: No  Psychologic Depression?: No Anxiety?: No  Physical Exam: BP 117/84 mmHg  Pulse 95  Ht 5\' 9"  (1.753 m)  Wt 183 lb 3.2 oz (83.099 kg)  BMI 27.04 kg/m2  Constitutional:  Alert and oriented, No acute distress. HEENT: [place] AT, moist mucus membranes.  Trachea midline, no masses. Cardiovascular: No clubbing, cyanosis, or  edema. Respiratory: Normal respiratory effort, no increased work of breathing. GI: Abdomen is soft, nontender, nondistended, no abdominal masses GU: No CVA tenderness.  Skin: No rashes, bruises or suspicious lesions. Lymph: No cervical or inguinal adenopathy. Neurologic: Grossly intact, no focal deficits, moving all 4 extremities. Psychiatric: Normal mood and affect.  Laboratory Data: Lab Results  Component Value Date   WBC 10.9* 04/13/2016   HGB 13.7 04/13/2016   HCT 40.5 04/13/2016   MCV 86.9 04/13/2016   PLT 367 04/13/2016    Lab Results  Component Value Date   CREATININE 1.25* 04/14/2016    No results found for: PSA  No results found for: TESTOSTERONE  Lab Results  Component Value Date   HGBA1C 7.6* 04/13/2016    Urinalysis    Component Value Date/Time   COLORURINE YELLOW* 04/13/2016 0956   APPEARANCEUR CLOUDY* 04/13/2016 0956   LABSPEC 1.017 04/13/2016 0956   PHURINE 5.0 04/13/2016 0956   GLUCOSEU NEGATIVE 04/13/2016 0956   HGBUR 3+* 04/13/2016 0956   BILIRUBINUR NEGATIVE 04/13/2016 0956   KETONESUR NEGATIVE 04/13/2016 0956   PROTEINUR 30* 04/13/2016 0956   NITRITE NEGATIVE 04/13/2016 0956   LEUKOCYTESUR NEGATIVE 04/13/2016 0956    Pertinent Imaging: CLINICAL DATA: Kidney stones.  EXAM: RENAL / URINARY TRACT ULTRASOUND  COMPLETE  COMPARISON: CT 04/13/2016.  FINDINGS: Right Kidney:  Length: 11.7 cm. Echogenicity within normal limits. No mass or hydronephrosis visualized. Tiny nonobstructing calyceal stones, largest measures 8 mm.  Left Kidney:  Length: 12.2 cm. Echogenicity within normal limits. No mass or hydronephrosis visualized. Tiny nonobstructing calyceal stones, the largest measures 5 mm.  Bladder:  Appears normal for degree of bladder distention. Bilateral ureteral jets noted.  IMPRESSION: Bilateral small nonobstructing renal calyceal stones again noted. No hydronephrosis.  Assessment & Plan:    1. Bilateral Nephrolithiasis I had a long discussion with the patient regarding his residual bilateral nephrolithiasis. We discussed treatment options include watchful waiting, ureteroscopy, and lithotripsy. He is not interested watchful waiting as he does not want to experience the pain of passing a stone. I do not that he is a good candidate for lithotripsy as he has multiple stones that this would take multiple procedures. His best chance of being stone free was with bilateral ureteroscopy. We discussed the risks, benefits, indications of this procedure. We discussed the risks including but not limited to bleeding, infection, and ureteral injury. He also understands it may be a need for repeat procedure this stones are accessible in the first attempt. All questions were answered.  We also had a long discussion regarding nephrolithiasis. We discussed the ABCs of stone prevention in great detail. He was given a booklet. He may benefit from a 24 urinalysis after we clear stones.  We will send his urine for culture today in anticipation of his planned surgery.    Nickie Retort, MD  [facility] [address] [place], [place] [address] [phone]

## 2016-06-05 NOTE — Interval H&P Note (Signed)
History and Physical Interval Note:  06/05/2016 10:50 AM  Jesse Alvarado  has presented today for surgery, with the diagnosis of BILATERAL KIDNEY STONES  The various methods of treatment have been discussed with the patient and family. After consideration of risks, benefits and other options for treatment, the patient has consented to  Procedure(s): URETEROSCOPY WITH HOLMIUM LASER LITHOTRIPSY (Bilateral) CYSTOSCOPY WITH STENT PLACEMENT (Bilateral) as a surgical intervention .  The patient's history has been reviewed, patient examined, no change in status, stable for surgery.  I have reviewed the patient's chart and labs.  Questions were answered to the patient's satisfaction.    RRR Unlabored respirations  Horald Pollen [place]

## 2016-06-05 NOTE — Op Note (Signed)
Date of procedure: 06/05/16  Preoperative diagnosis:  1. Bilateral nephrolithiasis   Postoperative diagnosis:  1. Bilateral nephrolithiasis   Procedure: 1. Cystoscopy 2. Bilateral ureteroscopy 3. Laser lithotripsy 4. Stone basketing 5. Bilateral retrograde pyelogram with interpretation 6. Bilateral ureteral stent placement 6 French by 26 cm  Surgeon: Baruch Gouty, MD  Anesthesia: General  Complications: None  Intraoperative findings: The patient had 4 stones that range 3-6 mm on the right which were dusted. On the left, the patient had a 7 mm stone in the lower pole which was broken into 5 mm fragment with laser lithotripsy and removed with a stone basket. Bilateral retrograde pyelograms show no filling defects at the end the procedure.  EBL: None  Specimens: Left renal stone  Drains: Bilateral 6 French by 26 cm double-J ureteral stents  Disposition: Stable to the postanesthesia care unit  Indication for procedure: The patient is a 50 y.o. male with bilateral nonobstructing stones presents today for elective stone removal.  After reviewing the management options for treatment, the patient elected to proceed with the above surgical procedure(s). We have discussed the potential benefits and risks of the procedure, side effects of the proposed treatment, the likelihood of the patient achieving the goals of the procedure, and any potential problems that might occur during the procedure or recuperation. Informed consent has been obtained.  Description of procedure: The patient was met in the preoperative area. All risks, benefits, and indications of the procedure were described in great detail. The patient consented to the procedure. Preoperative antibiotics were given. The patient was taken to the operative theater. General anesthesia was induced per the anesthesia service. The patient was then placed in the dorsal lithotomy position and prepped and draped in the usual sterile  fashion. A preoperative timeout was called.   21 French 30 cystoscope was inserted into the patient's bladder per urethra atraumatically. With the aid of a dual-lumen catheter 2 sensor wires were advanced level of the right renal pelvis under fluoroscopy. Cystoscope was withdrawn. Under fluoroscopy, a ureteral access sheath was easily placed over one of the sensor wires. The flexible ureteroscope was then inserted into the ureteral access into the right renal pelvis. 4 stones were encountered in ranging in size from 3-6 mm. These were dusted. There were no fragments left on Pan nephroscopy greater than 2 mm. Retrograde pyelogram confirmed this. The ureteral access sheath was then withdrawn under direct visualization showing no evidence of significant trauma. Cystoscope was then reassembled over the remaining sensor wire and a 6 [place] by 26 cm double-J ureteral stent was placed and the sensor wire removed. A curl was seen in the patient's urinary bladder direct position and in the patient's right renal pelvis under fluoroscopy. This process was then repeated in identical fashion the left side. There is 7 mm stone in the lower pole which was decreased to 5 Milner's in size with laser lithotripsy. It was then removed for stone passage atraumatically. This was sent to the pathology. Pan nephroscopy along with left retropyelogram was negative for any further filling defects to suggest further stone burden. A left ureteral stent was then placed in identical fashion to confirm the correct place as in the right side. The patient's bladder was then drained. His woke from anesthesia and transferred in stable condition to postanesthesia care unit.  Plan: The patient will follow-up in one month for bilateral ureteral stent removal in the office. He'll need a renal ultrasound in 1 month to rule out iatrogenic hydronephrosis.  Baruch Gouty, M.D.

## 2016-06-05 NOTE — Anesthesia Preprocedure Evaluation (Addendum)
Anesthesia Evaluation    Airway Mallampati: II       Dental  (+) Poor Dentition   Pulmonary asthma , sleep apnea and Continuous Positive Airway Pressure Ventilation ,           Cardiovascular hypertension, On Medications  Rhythm:regular Rate:Normal     Neuro/Psych    GI/Hepatic GERD  Medicated,  Endo/Other  diabetes  Renal/GU Renal disease     Musculoskeletal   Abdominal   Peds  Hematology  (+) anemia ,   Anesthesia Other Findings   Reproductive/Obstetrics                            Anesthesia Physical Anesthesia Plan  ASA: III  Anesthesia Plan: General LMA   Post-op Pain Management:    Induction:   Airway Management Planned:   Additional Equipment:   Intra-op Plan:   Post-operative Plan:   Informed Consent: I have reviewed the patients History and Physical, chart, labs and discussed the procedure including the risks, benefits and alternatives for the proposed anesthesia with the patient or authorized representative who has indicated his/her understanding and acceptance.     Plan Discussed with:   Anesthesia Plan Comments:         Anesthesia Quick Evaluation

## 2016-06-06 NOTE — Anesthesia Postprocedure Evaluation (Signed)
Anesthesia Post Note  Patient: Jesse Alvarado  Procedure(s) Performed: Procedure(s) (LRB): URETEROSCOPY WITH HOLMIUM LASER LITHOTRIPSY (Bilateral) CYSTOSCOPY WITH STENT PLACEMENT (Bilateral)  Patient location during evaluation: PACU Anesthesia Type: General Level of consciousness: awake and alert Pain management: pain level controlled Vital Signs Assessment: post-procedure vital signs reviewed and stable Respiratory status: spontaneous breathing, nonlabored ventilation, respiratory function stable and patient connected to nasal cannula oxygen Cardiovascular status: blood pressure returned to baseline and stable Postop Assessment: no signs of nausea or vomiting Anesthetic complications: no    Last Vitals:  Vitals:   06/05/16 1456 06/05/16 1520  BP: 125/76 123/64  Pulse: 79 75  Resp: 12 12  Temp:      Last Pain:  Vitals:   06/05/16 1520  TempSrc:   PainSc: 3                  Molli Barrows

## 2016-06-08 ENCOUNTER — Encounter: Payer: Self-pay | Admitting: Urology

## 2016-06-08 ENCOUNTER — Other Ambulatory Visit: Payer: Self-pay

## 2016-06-08 ENCOUNTER — Telehealth: Payer: Self-pay | Admitting: Radiology

## 2016-06-08 DIAGNOSIS — N200 Calculus of kidney: Secondary | ICD-10-CM

## 2016-06-08 MED ORDER — OXYCODONE-ACETAMINOPHEN 5-325 MG PO TABS: 1.0000 | ORAL_TABLET | Freq: Four times a day (QID) | ORAL | 0 refills | Status: DC | PRN

## 2016-06-08 NOTE — Telephone Encounter (Signed)
LMOM

## 2016-06-08 NOTE — Telephone Encounter (Signed)
Hematuria is to be expected after this procedure. The pain during urination is likely from the stents refluxing urine to his kidneys while his bladder contracts during voiding. We can try percocet instead of vicodin if whoever is in the office today is willing prescribe it since I am not in the office to sign the Rx. Could also give him myrbetriq 25 mg daily samples. Not sure if either of those will help the problem that he is having. It willl not completely resolve until the stents are removed.

## 2016-06-08 NOTE — Telephone Encounter (Signed)
Notified pt of Dr Carlynn Purl note below & that prescription is at front desk as well as Myrbetriq samples. Questions answered. Pt voices understanding.

## 2016-06-08 NOTE — Telephone Encounter (Signed)
Pt c/o hematuria over weekend. No clots noted. Advised pt that some bleeding is normal & to increase fluid intake.  Also c/o severe pain with urination. Pt is taking 2 vicodin every 4 hrs without relief. Please advise.

## 2016-06-11 ENCOUNTER — Ambulatory Visit (INDEPENDENT_AMBULATORY_CARE_PROVIDER_SITE_OTHER): Payer: BLUE CROSS/BLUE SHIELD | Admitting: Urology

## 2016-06-11 VITALS — BP 132/92 | HR 89 | Ht 69.0 in | Wt 176.0 lb

## 2016-06-11 DIAGNOSIS — N200 Calculus of kidney: Secondary | ICD-10-CM | POA: Diagnosis not present

## 2016-06-11 LAB — URINALYSIS, COMPLETE
Bilirubin, UA: NEGATIVE
KETONES UA: NEGATIVE
NITRITE UA: NEGATIVE
SPEC GRAV UA: 1.025 (ref 1.005–1.030)
Urobilinogen, Ur: 0.2 mg/dL (ref 0.2–1.0)
pH, UA: 5.5 (ref 5.0–7.5)

## 2016-06-11 LAB — MICROSCOPIC EXAMINATION

## 2016-06-11 MED ORDER — LIDOCAINE HCL 2 % EX GEL
1.0000 "application " | Freq: Once | CUTANEOUS | Status: AC
  Administered 2016-06-11: 1 via URETHRAL

## 2016-06-11 MED ORDER — DOXYCYCLINE HYCLATE 100 MG PO TABS
100.0000 mg | ORAL_TABLET | Freq: Once | ORAL | Status: AC
  Administered 2016-06-11: 100 mg via ORAL

## 2016-06-11 NOTE — Progress Notes (Signed)
06/11/2016 9:11 AM   Jesse Alvarado February 25, 1966 [id number]  Referring provider: Glendon Axe, MD [place] [facility] [place], [place] [address]  Chief Complaint  Patient presents with  . Cysto Stent Removal    HPI: The patient is a 50 year old male status post bilateral ureteroscopy for bilateral nonobstructing stones presents today for bilateral stent removal. Patient has had intermittent flank pain since stent placement. His no other complaints at this time.   PMH: Past Medical History:  Diagnosis Date  . Anemia    taking 3 iron pills a day  . Arthritis   . Asthma    sports induced asthma. takes inhalers when needed  . Cancer (HCC)    Basal cell  . Chronic kidney disease    kidney stones  . Diabetes mellitus without complication ([place])   . Diverticulosis   . Erectile dysfunction   . GERD (gastroesophageal reflux disease)   . Hyperlipidemia   . Hypertension    per patient, he does not have high bp but is treated for his kidneys and his diabetes  . Kidney stone   . Sleep apnea    use C-PAP    Surgical History: Past Surgical History:  Procedure Laterality Date  . CYSTOSCOPY WITH STENT PLACEMENT Bilateral 06/05/2016   Procedure: CYSTOSCOPY WITH STENT PLACEMENT;  Surgeon: Nickie Retort, MD;  Location: [facility] ORS;  Service: Urology;  Laterality: Bilateral;  . CYSTOSCOPY/URETEROSCOPY/HOLMIUM LASER/STENT PLACEMENT Left 04/13/2016   Procedure: CYSTOSCOPY/RETROGRADE PYELOGRAM/URETEROSCOPY WITH HOLMIUM LASER LITHOTRIPSY//STENT PLACEMENT;  Surgeon: Festus Aloe, MD;  Location: [facility] ORS;  Service: Urology;  Laterality: Left;  . ESOPHAGOGASTRODUODENOSCOPY (EGD) WITH PROPOFOL N/A 12/27/2015   Procedure: ESOPHAGOGASTRODUODENOSCOPY (EGD) WITH PROPOFOL;  Surgeon: Manya Silvas, MD;  Location: [facility] ENDOSCOPY;  Service: Endoscopy;  Laterality: N/A;  . HERNIA REPAIR  [id number]   umbilical  . KNEE ARTHROSCOPY Right 2015   had bursa sack repaired  .  PREPATELLAR BURSA EXCISION Left 1993   and ostetomy. had at least 5 surgeries in 15 years  . SHOULDER SURGERY Right 2011   tendon was shredded and was trimmed, repaired and reattached. Screws in shoulder  . URETEROSCOPY WITH HOLMIUM LASER LITHOTRIPSY Bilateral 06/05/2016   Procedure: URETEROSCOPY WITH HOLMIUM LASER LITHOTRIPSY;  Surgeon: Nickie Retort, MD;  Location: [facility] ORS;  Service: Urology;  Laterality: Bilateral;  . VASECTOMY  2002    Home Medications:    Medication List       Accurate as of 06/11/16  9:11 AM. Always use your most recent med list.          ciclopirox 8 % solution Commonly known as:  PENLAC Apply topically at bedtime. Reported on 05/22/2016   fenofibrate 145 MG tablet Commonly known as:  TRICOR Take 145 mg by mouth daily.   ferrous gluconate 324 MG tablet Commonly known as:  FERGON Take 1 tablet by mouth 3 (three) times daily with meals.   fluticasone 50 MCG/ACT nasal spray Commonly known as:  FLONASE Place 2 sprays into both nostrils daily as needed for allergies.   glimepiride 2 MG tablet Commonly known as:  AMARYL Take 2 mg by mouth daily with breakfast.   ketorolac 10 MG tablet Commonly known as:  TORADOL Take 10 mg by mouth every 6 (six) hours as needed for moderate pain. Reported on 05/22/2016   levocetirizine 5 MG tablet Commonly known as:  XYZAL Take 5 mg by mouth daily.   losartan 25 MG tablet Commonly known as:  COZAAR Take  25 mg by mouth daily.   metFORMIN 850 MG tablet Commonly known as:  GLUCOPHAGE Take 850 mg by mouth 3 (three) times daily.   multivitamin with minerals Tabs tablet Take 1 tablet by mouth daily.   oxyCODONE-acetaminophen 5-325 MG tablet Commonly known as:  ROXICET Take 1 tablet by mouth every 6 (six) hours as needed for severe pain.   pantoprazole 40 MG tablet Commonly known as:  PROTONIX Take 40 mg by mouth 2 (two) times daily.   sildenafil 100 MG tablet Commonly known as:  VIAGRA Take 100 mg by  mouth daily as needed for erectile dysfunction.   tiZANidine 2 MG tablet Commonly known as:  ZANAFLEX Take 2 mg by mouth at bedtime as needed for muscle spasms.   traMADol 50 MG tablet Commonly known as:  ULTRAM Take 50 mg by mouth every 6 (six) hours as needed for moderate pain. Reported on 05/22/2016   vitamin C 500 MG tablet Commonly known as:  ASCORBIC ACID Take 500 mg by mouth daily.       Allergies:  Allergies  Allergen Reactions  . Ciprofloxacin Nausea Only    Family History: Family History  Problem Relation Age of Onset  . Urolithiasis Father   . Kidney disease Father   . Prostate cancer Neg Hx   . Kidney cancer Neg Hx     Social History:  reports that he has never smoked. He has never used smokeless tobacco. He reports that he does not drink alcohol or use drugs.  ROS:                                        Physical Exam: BP (!) 132/92   Pulse 89   Ht 5\' 9"  (1.753 m)   Wt 176 lb (79.8 kg)   BMI 25.99 kg/m   Constitutional:  Alert and oriented, No acute distress. HEENT: [place] AT, moist mucus membranes.  Trachea midline, no masses. Cardiovascular: No clubbing, cyanosis, or edema. Respiratory: Normal respiratory effort, no increased work of breathing. GI: Abdomen is soft, nontender, nondistended, no abdominal masses GU: No CVA tenderness.  Skin: No rashes, bruises or suspicious lesions. Lymph: No cervical or inguinal adenopathy. Neurologic: Grossly intact, no focal deficits, moving all 4 extremities. Psychiatric: Normal mood and affect.  Laboratory Data: Lab Results  Component Value Date   WBC 6.2 05/26/2016   HGB 12.5 (L) 05/26/2016   HCT 36.2 (L) 05/26/2016   MCV 87.8 05/26/2016   PLT 287 05/26/2016    Lab Results  Component Value Date   CREATININE 1.15 05/26/2016    No results found for: PSA  No results found for: TESTOSTERONE  Lab Results  Component Value Date   HGBA1C 7.6 (H) 04/13/2016    Urinalysis      Component Value Date/Time   COLORURINE YELLOW (A) 04/13/2016 0956   APPEARANCEUR Clear 06/02/2016 0847   LABSPEC 1.017 04/13/2016 0956   PHURINE 5.0 04/13/2016 0956   GLUCOSEU 1+ (A) 06/02/2016 0847   HGBUR 3+ (A) 04/13/2016 0956   BILIRUBINUR Negative 06/02/2016 0847   KETONESUR NEGATIVE 04/13/2016 0956   PROTEINUR Negative 06/02/2016 0847   PROTEINUR 30 (A) 04/13/2016 0956   NITRITE Negative 06/02/2016 0847   NITRITE NEGATIVE 04/13/2016 0956   LEUKOCYTESUR Negative 06/02/2016 0847      Cystoscopy Procedure Note  Patient identification was confirmed, informed consent was obtained, and patient was prepped using  Betadine solution.  Lidocaine jelly was administered per urethral meatus.    Preoperative abx where received prior to procedure.     Pre-Procedure: - Inspection reveals a normal caliber ureteral meatus.  Procedure: The flexible cystoscope was introduced without difficulty - No urethral strictures/lesions are present. -Bilateral ureteral stents removed with flexible graspers intact per urethral meatus   Post-Procedure: - Patient tolerated the procedure well    Assessment & Plan:    1. Nephrolithiasis The patient will follow-up in one month for a renal ultrasound to rule out iatrogenic hydronephrosis.   Return for with renal u/s prior.  Nickie Retort, MD  [facility] [address], [place], [place] [address] [phone]

## 2016-06-18 LAB — STONE ANALYSIS
CA OXALATE, MONOHYDR.: 95 %
CA PHOS CRY STONE QL IR: 5 %
Stone Weight KSTONE: 30 mg

## 2016-06-22 ENCOUNTER — Ambulatory Visit (INDEPENDENT_AMBULATORY_CARE_PROVIDER_SITE_OTHER): Payer: BLUE CROSS/BLUE SHIELD

## 2016-06-22 ENCOUNTER — Ambulatory Visit
Admission: RE | Admit: 2016-06-22 | Discharge: 2016-06-22 | Disposition: A | Discharge status: Home / Self Care | Payer: BLUE CROSS/BLUE SHIELD | Source: Ambulatory Visit | Attending: Urology | Admitting: Urology

## 2016-06-22 ENCOUNTER — Encounter: Payer: Self-pay | Admitting: Urology

## 2016-06-22 ENCOUNTER — Telehealth: Payer: Self-pay

## 2016-06-22 VITALS — BP 124/87 | HR 102 | Temp 97.9°F | Wt 180.0 lb

## 2016-06-22 DIAGNOSIS — K760 Fatty (change of) liver, not elsewhere classified: Secondary | ICD-10-CM | POA: Diagnosis not present

## 2016-06-22 DIAGNOSIS — N200 Calculus of kidney: Secondary | ICD-10-CM | POA: Diagnosis present

## 2016-06-22 LAB — URINALYSIS, COMPLETE
Bilirubin, UA: NEGATIVE
GLUCOSE, UA: NEGATIVE
KETONES UA: NEGATIVE
Leukocytes, UA: NEGATIVE
NITRITE UA: NEGATIVE
Protein, UA: NEGATIVE
Specific Gravity, UA: 1.01 (ref 1.005–1.030)
UUROB: 0.2 mg/dL (ref 0.2–1.0)
pH, UA: 5.5 (ref 5.0–7.5)

## 2016-06-22 LAB — MICROSCOPIC EXAMINATION: Bacteria, UA: NONE SEEN

## 2016-06-22 NOTE — Telephone Encounter (Signed)
Pt called c/o severe right side flank pain. Pt denied n/v, f/c, dysuria. Pt wanted to bring in a urine for cx. Per Dr. Tresa Moore pt needs a STAT RUS for evaluation. Orders placed. Pt made aware.

## 2016-06-22 NOTE — Progress Notes (Signed)
Pt came in today with c/o severe right flank pain post surgery and stent removal. Per Dr. Tresa Moore pt RUS is negative. Pt VS- 180.0lb, 124/87, 102, 97.9. Clean catch urine was obtained for u/a.

## 2016-07-09 ENCOUNTER — Ambulatory Visit: Payer: BLUE CROSS/BLUE SHIELD

## 2016-07-14 ENCOUNTER — Telehealth: Payer: Self-pay | Admitting: Urology

## 2016-07-14 NOTE — Telephone Encounter (Signed)
Patient called and cx his appt for 07-17-16 did not ant to reschd it   michelle

## 2016-07-17 ENCOUNTER — Ambulatory Visit: Payer: BLUE CROSS/BLUE SHIELD

## 2016-08-03 DIAGNOSIS — E538 Deficiency of other specified B group vitamins: Secondary | ICD-10-CM | POA: Insufficient documentation

## 2016-08-14 ENCOUNTER — Encounter: Payer: Self-pay | Admitting: Urology

## 2016-08-14 ENCOUNTER — Ambulatory Visit (INDEPENDENT_AMBULATORY_CARE_PROVIDER_SITE_OTHER): Payer: BLUE CROSS/BLUE SHIELD | Admitting: Urology

## 2016-08-14 VITALS — BP 125/79 | HR 80 | Ht 69.0 in | Wt 180.9 lb

## 2016-08-14 DIAGNOSIS — N200 Calculus of kidney: Secondary | ICD-10-CM | POA: Diagnosis not present

## 2016-08-14 DIAGNOSIS — R39198 Other difficulties with micturition: Secondary | ICD-10-CM

## 2016-08-14 LAB — MICROSCOPIC EXAMINATION: Bacteria, UA: NONE SEEN

## 2016-08-14 LAB — URINALYSIS, COMPLETE
Bilirubin, UA: NEGATIVE
GLUCOSE, UA: NEGATIVE
KETONES UA: NEGATIVE
Leukocytes, UA: NEGATIVE
NITRITE UA: NEGATIVE
Protein, UA: NEGATIVE
RBC, UA: NEGATIVE
SPEC GRAV UA: 1.02 (ref 1.005–1.030)
Urobilinogen, Ur: 0.2 mg/dL (ref 0.2–1.0)
pH, UA: 5.5 (ref 5.0–7.5)

## 2016-08-14 NOTE — Progress Notes (Signed)
08/14/2016 9:40 AM   Jesse Alvarado Mar 16, 1966 [id number]  Referring provider: Glendon Axe, MD [place] [facility] [place], [place] [address]  Chief Complaint  Patient presents with  . Nephrolithiasis    HPI: The patient is a 50 year old gentleman who underwent bilateral ureteroscopy and laser lithotripsy for bilateral nephrolithiasis in July 2017 with subsequent follow-up for stent removal. He also had a emergent left ureteroscopy and stone removal for obstructing calculus. Stones were calcium oxalate monohydrate 95% and 5% calcium phosphate. He presents today for post-instrumentation renal ultrasound follow-up which was unremarkable. He did have severe flank pain after stent removal. Aforementioned ultrasound showed no stones or hydronephrosis.    He returns today with slight concern that he has to strain to urinate first thing in the morning and needs to void again tendons later. This does not happen during the day. His urine is not spray. This is been going on a few weeks. He's never have this before. He has no nocturia frequency, or urgency. He is not very bothered by this symptom he just wanted to be sure that nothing more serious is going on.   PMH: Past Medical History:  Diagnosis Date  . Anemia    taking 3 iron pills a day  . Arthritis   . Asthma    sports induced asthma. takes inhalers when needed  . Cancer (HCC)    Basal cell  . Chronic kidney disease    kidney stones  . Diabetes mellitus without complication ([place])   . Diverticulosis   . Erectile dysfunction   . GERD (gastroesophageal reflux disease)   . Hyperlipidemia   . Hypertension    per patient, he does not have high bp but is treated for his kidneys and his diabetes  . Kidney stone   . Sleep apnea    use C-PAP    Surgical History: Past Surgical History:  Procedure Laterality Date  . CYSTOSCOPY WITH STENT PLACEMENT Bilateral 06/05/2016   Procedure: CYSTOSCOPY WITH STENT  PLACEMENT;  Surgeon: Nickie Retort, MD;  Location: [facility] ORS;  Service: Urology;  Laterality: Bilateral;  . CYSTOSCOPY/URETEROSCOPY/HOLMIUM LASER/STENT PLACEMENT Left 04/13/2016   Procedure: CYSTOSCOPY/RETROGRADE PYELOGRAM/URETEROSCOPY WITH HOLMIUM LASER LITHOTRIPSY//STENT PLACEMENT;  Surgeon: Festus Aloe, MD;  Location: [facility] ORS;  Service: Urology;  Laterality: Left;  . ESOPHAGOGASTRODUODENOSCOPY (EGD) WITH PROPOFOL N/A 12/27/2015   Procedure: ESOPHAGOGASTRODUODENOSCOPY (EGD) WITH PROPOFOL;  Surgeon: Manya Silvas, MD;  Location: [facility] ENDOSCOPY;  Service: Endoscopy;  Laterality: N/A;  . HERNIA REPAIR  [id number]   umbilical  . KNEE ARTHROSCOPY Right 2015   had bursa sack repaired  . PREPATELLAR BURSA EXCISION Left 1993   and ostetomy. had at least 5 surgeries in 15 years  . SHOULDER SURGERY Right 2011   tendon was shredded and was trimmed, repaired and reattached. Screws in shoulder  . URETEROSCOPY WITH HOLMIUM LASER LITHOTRIPSY Bilateral 06/05/2016   Procedure: URETEROSCOPY WITH HOLMIUM LASER LITHOTRIPSY;  Surgeon: Nickie Retort, MD;  Location: [facility] ORS;  Service: Urology;  Laterality: Bilateral;  . VASECTOMY  2002    Home Medications:    Medication List       Accurate as of 08/14/16  9:40 AM. Always use your most recent med list.          ciclopirox 8 % solution Commonly known as:  PENLAC Apply topically at bedtime. Reported on 05/22/2016   cyanocobalamin 1000 MCG/ML injection Commonly known as:  (VITAMIN B-12) Inject into the muscle.   fenofibrate 145 MG  tablet Commonly known as:  TRICOR Take by mouth.   ferrous gluconate 324 MG tablet Commonly known as:  FERGON TAKE 1 TABLET BY MOUTH THREE TIMES DAILY WITH MEALS   fluticasone 50 MCG/ACT nasal spray Commonly known as:  FLONASE Place 2 sprays into both nostrils daily as needed for allergies.   folic acid 1 MG tablet Commonly known as:  FOLVITE Take by mouth.   glimepiride 4 MG tablet Commonly known as:   AMARYL Take by mouth.   ketorolac 10 MG tablet Commonly known as:  TORADOL Take 10 mg by mouth every 6 (six) hours as needed for moderate pain. Reported on 05/22/2016   levocetirizine 5 MG tablet Commonly known as:  XYZAL Take 5 mg by mouth daily.   losartan 25 MG tablet Commonly known as:  COZAAR Take 25 mg by mouth daily.   metFORMIN 850 MG tablet Commonly known as:  GLUCOPHAGE Take 850 mg by mouth 3 (three) times daily.   MULTI-VITAMINS Tabs Take by mouth.   multivitamin with minerals Tabs tablet Take 1 tablet by mouth daily.   oxyCODONE-acetaminophen 5-325 MG tablet Commonly known as:  ROXICET Take 1 tablet by mouth every 6 (six) hours as needed for severe pain.   pantoprazole 40 MG tablet Commonly known as:  PROTONIX Take 40 mg by mouth 2 (two) times daily.   sildenafil 100 MG tablet Commonly known as:  VIAGRA Take 100 mg by mouth daily as needed for erectile dysfunction.   tamsulosin 0.4 MG Caps capsule Commonly known as:  FLOMAX TK 1 C PO QD   tiZANidine 2 MG tablet Commonly known as:  ZANAFLEX Take 2 mg by mouth at bedtime as needed for muscle spasms.   traMADol 50 MG tablet Commonly known as:  ULTRAM Take 50 mg by mouth every 6 (six) hours as needed for moderate pain. Reported on 05/22/2016   vitamin C 500 MG tablet Commonly known as:  ASCORBIC ACID Take 500 mg by mouth daily.       Allergies:  Allergies  Allergen Reactions  . Ciprofloxacin Nausea Only    Family History: Family History  Problem Relation Age of Onset  . Urolithiasis Father   . Kidney disease Father   . Prostate cancer Neg Hx   . Kidney cancer Neg Hx     Social History:  reports that he has never smoked. He has never used smokeless tobacco. He reports that he does not drink alcohol or use drugs.  ROS: UROLOGY Frequent Urination?: No Hard to postpone urination?: No Burning/pain with urination?: No Get up at night to urinate?: No Leakage of urine?: No Urine stream  starts and stops?: No Trouble starting stream?: No Do you have to strain to urinate?: No Blood in urine?: No Urinary tract infection?: No Sexually transmitted disease?: No Injury to kidneys or bladder?: No Painful intercourse?: No Weak stream?: No Erection problems?: No Penile pain?: No  Gastrointestinal Nausea?: No Vomiting?: No Indigestion/heartburn?: No Diarrhea?: No Constipation?: No  Constitutional Fever: No Night sweats?: No Weight loss?: No Fatigue?: No  Skin Skin rash/lesions?: No Itching?: No  Eyes Blurred vision?: No Double vision?: No  Ears/Nose/Throat Sore throat?: No Sinus problems?: No  Hematologic/Lymphatic Swollen glands?: No Easy bruising?: No  Cardiovascular Leg swelling?: No Chest pain?: No  Respiratory Cough?: No Shortness of breath?: No  Endocrine Excessive thirst?: No  Musculoskeletal Back pain?: No Joint pain?: No  Neurological Headaches?: No Dizziness?: No  Psychologic Depression?: No Anxiety?: No  Physical Exam: BP 125/79  Pulse 80   Ht 5\' 9"  (1.753 m)   Wt 180 lb 14.4 oz (82.1 kg)   BMI 26.71 kg/m   Constitutional:  Alert and oriented, No acute distress. HEENT: [place] AT, moist mucus membranes.  Trachea midline, no masses. Cardiovascular: No clubbing, cyanosis, or edema. Respiratory: Normal respiratory effort, no increased work of breathing. GI: Abdomen is soft, nontender, nondistended, no abdominal masses GU: No CVA tenderness.  Skin: No rashes, bruises or suspicious lesions. Lymph: No cervical or inguinal adenopathy. Neurologic: Grossly intact, no focal deficits, moving all 4 extremities. Psychiatric: Normal mood and affect.  Laboratory Data: Lab Results  Component Value Date   WBC 6.2 05/26/2016   HGB 12.5 (L) 05/26/2016   HCT 36.2 (L) 05/26/2016   MCV 87.8 05/26/2016   PLT 287 05/26/2016    Lab Results  Component Value Date   CREATININE 1.15 05/26/2016    No results found for: PSA  No results  found for: TESTOSTERONE  Lab Results  Component Value Date   HGBA1C 7.6 (H) 04/13/2016    Urinalysis    Component Value Date/Time   COLORURINE YELLOW (A) 04/13/2016 0956   APPEARANCEUR Clear 06/22/2016 1330   LABSPEC 1.017 04/13/2016 0956   PHURINE 5.0 04/13/2016 0956   GLUCOSEU Negative 06/22/2016 1330   HGBUR 3+ (A) 04/13/2016 0956   BILIRUBINUR Negative 06/22/2016 1330   KETONESUR NEGATIVE 04/13/2016 0956   PROTEINUR Negative 06/22/2016 1330   PROTEINUR 30 (A) 04/13/2016 0956   NITRITE Negative 06/22/2016 1330   NITRITE NEGATIVE 04/13/2016 0956   LEUKOCYTESUR Negative 06/22/2016 1330    Pertinent Imaging: CLINICAL DATA:  Kidney stones.  Flank pain  EXAM: RENAL / URINARY TRACT ULTRASOUND COMPLETE  COMPARISON:  05/08/2016  FINDINGS: Right Kidney:  Length: 11 cm. Echogenicity within normal limits. No mass or hydronephrosis visualized. No visible calculi  Left Kidney:  Length: 10.5 cm. Echogenicity within normal limits. No mass or hydronephrosis visualized. No visible calculi  Bladder:  Few internal echoes. No focal wall finding. Bilateral ureteral jets are present.  Incidental hepatic steatosis.  IMPRESSION: 1. No hydronephrosis.  Bilateral ureteral jets present. 2. Mild debris in the urinary bladder. 3. Previously seen renal calculi are not visualized today. 4. Hepatic steatosis.  Assessment & Plan:    1. Nephrolithiasis No evidence of disease. Postoperative renal ultrasound normal. We discussed his stone analysis as last visit as well as went over the ABCs of stone prevention that time. We briefly discussed 24-hour urine study the patient is not interested in this currently.  2. Difficulty voiding The patient is a very bothered by symptoms currently. His urinalysis is negative today. I do not think he has a postoperative urethral stricture as his symptoms are only in the morning. He will follow-up with [place] if his symptoms worsen.   Return  if symptoms worsen or fail to improve.  Nickie Retort, MD  [facility] [address], [place],  [address] [phone]

## 2016-09-02 ENCOUNTER — Encounter: Payer: Self-pay | Admitting: Oncology

## 2016-09-02 ENCOUNTER — Encounter (INDEPENDENT_AMBULATORY_CARE_PROVIDER_SITE_OTHER): Payer: Self-pay

## 2016-09-02 ENCOUNTER — Inpatient Hospital Stay: Payer: BLUE CROSS/BLUE SHIELD

## 2016-09-02 ENCOUNTER — Inpatient Hospital Stay: Payer: BLUE CROSS/BLUE SHIELD | Attending: Oncology | Admitting: Oncology

## 2016-09-02 VITALS — BP 114/80 | HR 99 | Temp 97.7°F | Resp 18 | Wt 180.3 lb

## 2016-09-02 DIAGNOSIS — Z7984 Long term (current) use of oral hypoglycemic drugs: Secondary | ICD-10-CM | POA: Diagnosis not present

## 2016-09-02 DIAGNOSIS — N189 Chronic kidney disease, unspecified: Secondary | ICD-10-CM

## 2016-09-02 DIAGNOSIS — I129 Hypertensive chronic kidney disease with stage 1 through stage 4 chronic kidney disease, or unspecified chronic kidney disease: Secondary | ICD-10-CM

## 2016-09-02 DIAGNOSIS — Z85828 Personal history of other malignant neoplasm of skin: Secondary | ICD-10-CM | POA: Diagnosis not present

## 2016-09-02 DIAGNOSIS — J45909 Unspecified asthma, uncomplicated: Secondary | ICD-10-CM | POA: Diagnosis not present

## 2016-09-02 DIAGNOSIS — E1122 Type 2 diabetes mellitus with diabetic chronic kidney disease: Secondary | ICD-10-CM

## 2016-09-02 DIAGNOSIS — Z79899 Other long term (current) drug therapy: Secondary | ICD-10-CM

## 2016-09-02 DIAGNOSIS — Z9989 Dependence on other enabling machines and devices: Secondary | ICD-10-CM | POA: Diagnosis not present

## 2016-09-02 DIAGNOSIS — G4730 Sleep apnea, unspecified: Secondary | ICD-10-CM | POA: Diagnosis not present

## 2016-09-02 DIAGNOSIS — D649 Anemia, unspecified: Secondary | ICD-10-CM

## 2016-09-02 DIAGNOSIS — K219 Gastro-esophageal reflux disease without esophagitis: Secondary | ICD-10-CM

## 2016-09-02 DIAGNOSIS — E785 Hyperlipidemia, unspecified: Secondary | ICD-10-CM | POA: Diagnosis not present

## 2016-09-02 DIAGNOSIS — Z87442 Personal history of urinary calculi: Secondary | ICD-10-CM

## 2016-09-02 DIAGNOSIS — M1990 Unspecified osteoarthritis, unspecified site: Secondary | ICD-10-CM | POA: Diagnosis not present

## 2016-09-02 LAB — CBC
HCT: 41.2 % (ref 40.0–52.0)
Hemoglobin: 14 g/dL (ref 13.0–18.0)
MCH: 30.2 pg (ref 26.0–34.0)
MCHC: 33.9 g/dL (ref 32.0–36.0)
MCV: 89.1 fL (ref 80.0–100.0)
PLATELETS: 322 10*3/uL (ref 150–440)
RBC: 4.63 MIL/uL (ref 4.40–5.90)
RDW: 13.3 % (ref 11.5–14.5)
WBC: 8.2 10*3/uL (ref 3.8–10.6)

## 2016-09-02 LAB — FERRITIN: FERRITIN: 81 ng/mL (ref 24–336)

## 2016-09-02 LAB — IRON AND TIBC
Iron: 65 ug/dL (ref 45–182)
SATURATION RATIOS: 15 % — AB (ref 17.9–39.5)
TIBC: 426 ug/dL (ref 250–450)
UIBC: 361 ug/dL

## 2016-09-02 LAB — LACTATE DEHYDROGENASE: LDH: 121 U/L (ref 98–192)

## 2016-09-02 LAB — DAT, POLYSPECIFIC AHG (ARMC ONLY): POLYSPECIFIC AHG TEST: NEGATIVE

## 2016-09-02 LAB — VITAMIN B12: VITAMIN B 12: 168 pg/mL — AB (ref 180–914)

## 2016-09-02 LAB — FOLATE: FOLATE: 57.3 ng/mL (ref >5.9)

## 2016-09-02 NOTE — Progress Notes (Signed)
[place]  Telephone:(336) [phone] Fax:(336) [phone]  ID: Jesse Alvarado OB: 06-09-1966  MR#: [id number]  [id number]  Patient Care Team: Glendon Axe, MD as PCP - General (Internal Medicine)  CHIEF COMPLAINT: Chronic anemia, unspecified  INTERVAL HISTORY: Patient is a 50 year old male who was noted to have a mild chronic anemia on routine blood work. He recently started on folic acid, iron supplementation as well as B-12 shots. He currently feels well and is asymptomatic. He does not complain of weakness and fatigue today. He has no neurologic complaints. He denies any recent fevers or illnesses. He has a good appetite and denies weight loss. He has no chest pain or shortness of breath. He denies any nausea, vomiting, constipation, or diarrhea. He denies any melena or hematochezia. He has no urinary complaints. Patient feels at his baseline and offers no specific complaints today.  REVIEW OF SYSTEMS:   Review of Systems  Constitutional: Negative.  Negative for fever, malaise/fatigue and weight loss.  Respiratory: Negative.  Negative for cough and shortness of breath.   Cardiovascular: Negative.  Negative for chest pain and leg swelling.  Gastrointestinal: Negative for abdominal pain, blood in stool and melena.  Genitourinary: Negative.   Musculoskeletal: Negative.   Neurological: Negative.  Negative for weakness.  Psychiatric/Behavioral: Negative.  The patient is not nervous/anxious.    As per HPI. Otherwise, a complete review of systems is negative.   PAST MEDICAL HISTORY: Past Medical History:  Diagnosis Date  . Anemia    taking 3 iron pills a day  . Arthritis   . Asthma    sports induced asthma. takes inhalers when needed  . Cancer (HCC)    Basal cell  . Chronic kidney disease    kidney stones  . Diabetes mellitus without complication ([place])   . Diverticulosis   . Erectile dysfunction   . GERD (gastroesophageal reflux disease)   .  Hyperlipidemia   . Hypertension    per patient, he does not have high bp but is treated for his kidneys and his diabetes  . Kidney stone   . Sleep apnea    use C-PAP    PAST SURGICAL HISTORY: Past Surgical History:  Procedure Laterality Date  . CYSTOSCOPY WITH STENT PLACEMENT Bilateral 06/05/2016   Procedure: CYSTOSCOPY WITH STENT PLACEMENT;  Surgeon: Nickie Retort, MD;  Location: [facility] ORS;  Service: Urology;  Laterality: Bilateral;  . CYSTOSCOPY/URETEROSCOPY/HOLMIUM LASER/STENT PLACEMENT Left 04/13/2016   Procedure: CYSTOSCOPY/RETROGRADE PYELOGRAM/URETEROSCOPY WITH HOLMIUM LASER LITHOTRIPSY//STENT PLACEMENT;  Surgeon: Festus Aloe, MD;  Location: [facility];  Service: Urology;  Laterality: Left;  . ESOPHAGOGASTRODUODENOSCOPY (EGD) WITH PROPOFOL N/A 12/27/2015   Procedure: ESOPHAGOGASTRODUODENOSCOPY (EGD) WITH PROPOFOL;  Surgeon: Manya Silvas, MD;  Location: [facility] ENDOSCOPY;  Service: Endoscopy;  Laterality: N/A;  . HERNIA REPAIR  [id number]   umbilical  . KNEE ARTHROSCOPY Right 2015   had bursa sack repaired  . PREPATELLAR BURSA EXCISION Left 1993   and ostetomy. had at least 5 surgeries in 15 years  . SHOULDER SURGERY Right 2011   tendon was shredded and was trimmed, repaired and reattached. Screws in shoulder  . URETEROSCOPY WITH HOLMIUM LASER LITHOTRIPSY Bilateral 06/05/2016   Procedure: URETEROSCOPY WITH HOLMIUM LASER LITHOTRIPSY;  Surgeon: Nickie Retort, MD;  Location: [facility] ORS;  Service: Urology;  Laterality: Bilateral;  . VASECTOMY  2002    FAMILY HISTORY: Family History  Problem Relation Age of Onset  . Urolithiasis Father   . Kidney disease Father   . Prostate cancer  Neg Hx   . Kidney cancer Neg Hx     ADVANCED DIRECTIVES (Y/N):  N  HEALTH MAINTENANCE: Social History  Substance Use Topics  . Smoking status: Never Smoker  . Smokeless tobacco: Never Used  . Alcohol use No     Comment: very rare etoh      Colonoscopy:  PAP:  Bone density:  Lipid  panel:  Allergies  Allergen Reactions  . Ciprofloxacin Other (See Comments)    GI upset    Current Outpatient Prescriptions  Medication Sig Dispense Refill  . benzonatate (TESSALON) 200 MG capsule Take 200 mg by mouth 3 (three) times daily as needed for cough.    . CICLOPIROX EX Apply 1 application topically at bedtime.    . fenofibrate (TRICOR) 145 MG tablet Take 145 mg by mouth daily.     . ferrous gluconate (FERGON) 324 MG tablet TAKE 1 TABLET BY MOUTH THREE TIMES DAILY WITH MEALS    . fluticasone (FLONASE) 50 MCG/ACT nasal spray Place 2 sprays into both nostrils daily as needed for allergies.     . folic acid (FOLVITE) 1 MG tablet Take 1 mg by mouth daily.     Marland Kitchen glimepiride (AMARYL) 4 MG tablet Take 4 mg by mouth daily with breakfast.     . HYDROcodone-acetaminophen (NORCO/VICODIN) 5-325 MG tablet Take 1 tablet by mouth every 6 (six) hours as needed for moderate pain.    Marland Kitchen ketorolac (TORADOL) 10 MG tablet Take 10 mg by mouth every 6 (six) hours as needed for moderate pain. Reported on 05/22/2016    . levocetirizine (XYZAL) 5 MG tablet Take 5 mg by mouth daily.  11  . losartan (COZAAR) 25 MG tablet Take 25 mg by mouth daily.    . metFORMIN (GLUCOPHAGE) 850 MG tablet Take 850 mg by mouth 3 (three) times daily.    . Multiple Vitamin (MULTI-VITAMINS) TABS Take 1 tablet by mouth daily.     . pantoprazole (PROTONIX) 40 MG tablet Take 40 mg by mouth 2 (two) times daily.     . sildenafil (VIAGRA) 100 MG tablet Take 100 mg by mouth daily as needed for erectile dysfunction.    Marland Kitchen tiZANidine (ZANAFLEX) 2 MG tablet Take 2 mg by mouth at bedtime as needed for muscle spasms.     . traMADol (ULTRAM) 50 MG tablet Take 50 mg by mouth every 6 (six) hours as needed for moderate pain. Reported on 05/22/2016    . vitamin C (ASCORBIC ACID) 500 MG tablet Take 500 mg by mouth daily.     No current facility-administered medications for this visit.     OBJECTIVE: Vitals:   09/02/16 1207  BP: 114/80   Pulse: 99  Resp: 18  Temp: 97.7 F (36.5 C)     Body mass index is 26.63 kg/m.    ECOG FS:0 - Asymptomatic  General: Well-developed, well-nourished, no acute distress. Eyes: Pink conjunctiva, anicteric sclera. HEENT: Normocephalic, moist mucous membranes, clear oropharnyx. Lungs: Clear to auscultation bilaterally. Heart: Regular rate and rhythm. No rubs, murmurs, or gallops. Abdomen: Soft, nontender, nondistended. No organomegaly noted, normoactive bowel sounds. Musculoskeletal: No edema, cyanosis, or clubbing. Neuro: Alert, answering all questions appropriately. Cranial nerves grossly intact. Skin: No rashes or petechiae noted. Psych: Normal affect. Lymphatics: No cervical, calvicular, axillary or inguinal LAD.   LAB RESULTS:  Lab Results  Component Value Date   NA 136 05/26/2016   K 4.5 05/26/2016   CL 105 05/26/2016   CO2 26 05/26/2016   GLUCOSE  134 (H) 05/26/2016   BUN 22 (H) 05/26/2016   CREATININE 1.15 05/26/2016   CALCIUM 9.0 05/26/2016   PROT 6.9 09/01/2014   ALBUMIN 3.7 09/01/2014   AST 40 (H) 09/01/2014   ALT 61 09/01/2014   ALKPHOS 59 09/01/2014   BILITOT 0.3 09/01/2014   GFRNONAA >60 05/26/2016   GFRAA >60 05/26/2016    Lab Results  Component Value Date   WBC 8.2 09/02/2016   NEUTROABS 3.3 05/26/2016   HGB 14.0 09/02/2016   HCT 41.2 09/02/2016   MCV 89.1 09/02/2016   PLT 322 09/02/2016   Lab Results  Component Value Date   IRON 65 09/02/2016   TIBC 426 09/02/2016   IRONPCTSAT 15 (L) 09/02/2016   Lab Results  Component Value Date   FERRITIN 81 09/02/2016     STUDIES: No results found.  ASSESSMENT: Chronic anemia, unspecified  PLAN:   1. Chronic anemia, unspecified: Patient's hemoglobin is now within normal limits. He has a mildly decreased iron saturation as well as a decreased B-12 level, but the remainder of his laboratory work is either negative or within normal limits. He has been instructed to continue his oral iron  supplementation 3 times a day, folic acid 1 mg daily as well as monthly B-12 shots. No intervention is needed at this time. Return to clinic in 1 month for repeat laboratory work and further evaluation. Will consider IV Feraheme at that clinic visit.  Approximately 35 minutes was spent in discussion of which greater than 50% was consultation.  Patient expressed understanding and was in agreement with this plan. He also understands that He can call clinic at any time with any questions, concerns, or complaints.    Lloyd Huger, MD   09/06/2016 4:38 PM

## 2016-09-02 NOTE — Progress Notes (Signed)
New evaluation for anemia. States is feeling well today. Noticed symptoms of low hemoglobin appx 1 year ago when attempted to donate blood.

## 2016-09-03 LAB — PROTEIN ELECTROPHORESIS, SERUM
A/G RATIO SPE: 1.3 (ref 0.7–1.7)
ALBUMIN ELP: 4 g/dL (ref 2.9–4.4)
Alpha-1-Globulin: 0.2 g/dL (ref 0.0–0.4)
Alpha-2-Globulin: 0.7 g/dL (ref 0.4–1.0)
Beta Globulin: 1.3 g/dL (ref 0.7–1.3)
GLOBULIN, TOTAL: 3 g/dL (ref 2.2–3.9)
Gamma Globulin: 0.8 g/dL (ref 0.4–1.8)
TOTAL PROTEIN ELP: 7 g/dL (ref 6.0–8.5)

## 2016-09-03 LAB — ERYTHROPOIETIN: Erythropoietin: 11.3 m[IU]/mL (ref 2.6–18.5)

## 2016-09-03 LAB — ANA W/REFLEX: ANA: NEGATIVE

## 2016-09-03 LAB — HAPTOGLOBIN: HAPTOGLOBIN: 150 mg/dL (ref 34–200)

## 2016-09-04 LAB — HEMOGLOBINOPATHY EVALUATION
HGB A: 97.6 % (ref 94.0–98.0)
HGB C: 0 %
Hgb A2 Quant: 2.4 % (ref 0.7–3.1)
Hgb F Quant: 0 % (ref 0.0–2.0)
Hgb S Quant: 0 %

## 2016-10-05 ENCOUNTER — Telehealth: Payer: Self-pay

## 2016-10-05 NOTE — Telephone Encounter (Signed)
Pt called stating he is continuing to have "granuales" in his urine. Pt stated that last week he caught his urine and since the urine has sat for a while the sediment has gone to the bottom and now the urine is cloudy. Advised pt to drink more fluids. Pt stated that he is drinking plenty of water. Please advise.

## 2016-10-07 NOTE — Progress Notes (Signed)
[place]  Telephone:(336) [phone] Fax:(336) [phone]  ID: Jesse Alvarado OB: [birth year]  MR#: [id number]  [id number]  Patient Care Team: Glendon Axe, MD as PCP - General (Internal Medicine)  CHIEF COMPLAINT: Chronic anemia, unspecified  INTERVAL HISTORY: Patient returns to clinic today for repeat laboratory work and further evaluation. He currently feels well and is asymptomatic. He does not complain of weakness and fatigue today. He has no neurologic complaints. He denies any recent fevers or illnesses. He has a good appetite and denies weight loss. He has no chest pain or shortness of breath. He denies any nausea, vomiting, constipation, or diarrhea. He denies any melena or hematochezia. He has no urinary complaints. Patient feels at his baseline and offers no specific complaints today.  REVIEW OF SYSTEMS:   Review of Systems  Constitutional: Negative.  Negative for fever, malaise/fatigue and weight loss.  Respiratory: Negative.  Negative for cough and shortness of breath.   Cardiovascular: Negative.  Negative for chest pain and leg swelling.  Gastrointestinal: Negative for abdominal pain, blood in stool and melena.  Genitourinary: Negative.   Musculoskeletal: Negative.   Neurological: Negative.  Negative for weakness.  Psychiatric/Behavioral: Negative.  The patient is not nervous/anxious.    As per HPI. Otherwise, a complete review of systems is negative.   PAST MEDICAL HISTORY: Past Medical History:  Diagnosis Date  . Anemia    taking 3 iron pills a day  . Arthritis   . Asthma    sports induced asthma. takes inhalers when needed  . Cancer (HCC)    Basal cell  . Chronic kidney disease    kidney stones  . Diabetes mellitus without complication ([place])   . Diverticulosis   . Erectile dysfunction   . GERD (gastroesophageal reflux disease)   . Hyperlipidemia   . Hypertension    per patient, he does not have high bp but is treated for his  kidneys and his diabetes  . Kidney stone   . Sleep apnea    use C-PAP    PAST SURGICAL HISTORY: Past Surgical History:  Procedure Laterality Date  . CYSTOSCOPY WITH STENT PLACEMENT Bilateral 06/05/2016   Procedure: CYSTOSCOPY WITH STENT PLACEMENT;  Surgeon: Nickie Retort, MD;  Location: [facility] ORS;  Service: Urology;  Laterality: Bilateral;  . CYSTOSCOPY/URETEROSCOPY/HOLMIUM LASER/STENT PLACEMENT Left 04/13/2016   Procedure: CYSTOSCOPY/RETROGRADE PYELOGRAM/URETEROSCOPY WITH HOLMIUM LASER LITHOTRIPSY//STENT PLACEMENT;  Surgeon: Festus Aloe, MD;  Location: [facility] ORS;  Service: Urology;  Laterality: Left;  . ESOPHAGOGASTRODUODENOSCOPY (EGD) WITH PROPOFOL N/A 12/27/2015   Procedure: ESOPHAGOGASTRODUODENOSCOPY (EGD) WITH PROPOFOL;  Surgeon: Manya Silvas, MD;  Location[facility] ENDOSCOPY;  Service: Endoscopy;  Laterality: N/A;  . HERNIA REPAIR  [id number]   umbilical  . KNEE ARTHROSCOPY Right 2015   had bursa sack repaired  . PREPATELLAR BURSA EXCISION Left 1993   and ostetomy. had at least 5 surgeries in 15 years  . SHOULDER SURGERY Right 2011   tendon was shredded and was trimmed, repaired and reattached. Screws in shoulder  . URETEROSCOPY WITH HOLMIUM LASER LITHOTRIPSY Bilateral 06/05/2016   Procedure: URETEROSCOPY WITH HOLMIUM LASER LITHOTRIPSY;  Surgeon: Nickie Retort, MD;  Location: [facility] ORS;  Service: Urology;  Laterality: Bilateral;  . VASECTOMY  2002    FAMILY HISTORY: Family History  Problem Relation Age of Onset  . Urolithiasis Father   . Kidney disease Father   . Prostate cancer Neg Hx   . Kidney cancer Neg Hx     ADVANCED DIRECTIVES (Y/N):  N  HEALTH MAINTENANCE: Social History  Substance Use Topics  . Smoking status: Never Smoker  . Smokeless tobacco: Never Used  . Alcohol use No     Comment: very rare etoh      Colonoscopy:  PAP:  Bone density:  Lipid panel:  Allergies  Allergen Reactions  . Ciprofloxacin Other (See Comments)    GI upset     Current Outpatient Prescriptions  Medication Sig Dispense Refill  . benzonatate (TESSALON) 200 MG capsule Take 200 mg by mouth 3 (three) times daily as needed for cough.    . CICLOPIROX EX Apply 1 application topically at bedtime.    . fenofibrate (TRICOR) 145 MG tablet Take 145 mg by mouth daily.     . ferrous gluconate (FERGON) 324 MG tablet TAKE 1 TABLET BY MOUTH THREE TIMES DAILY WITH MEALS    . fluticasone (FLONASE) 50 MCG/ACT nasal spray Place 2 sprays into both nostrils daily as needed for allergies.     . folic acid (FOLVITE) 1 MG tablet Take 1 mg by mouth daily.     Marland Kitchen glimepiride (AMARYL) 4 MG tablet Take 4 mg by mouth daily with breakfast.     . HYDROcodone-acetaminophen (NORCO/VICODIN) 5-325 MG tablet Take 1 tablet by mouth every 6 (six) hours as needed for moderate pain.    Marland Kitchen ketorolac (TORADOL) 10 MG tablet Take 10 mg by mouth every 6 (six) hours as needed for moderate pain. Reported on 05/22/2016    . levocetirizine (XYZAL) 5 MG tablet Take 5 mg by mouth daily.  11  . losartan (COZAAR) 25 MG tablet Take 25 mg by mouth daily.    . metFORMIN (GLUCOPHAGE) 850 MG tablet Take 850 mg by mouth 3 (three) times daily.    . Multiple Vitamin (MULTI-VITAMINS) TABS Take 1 tablet by mouth daily.     . pantoprazole (PROTONIX) 40 MG tablet Take 40 mg by mouth 2 (two) times daily.     . sildenafil (VIAGRA) 100 MG tablet Take 100 mg by mouth daily as needed for erectile dysfunction.    Marland Kitchen tiZANidine (ZANAFLEX) 2 MG tablet Take 2 mg by mouth at bedtime as needed for muscle spasms.     . traMADol (ULTRAM) 50 MG tablet Take 50 mg by mouth every 6 (six) hours as needed for moderate pain. Reported on 05/22/2016    . vitamin C (ASCORBIC ACID) 500 MG tablet Take 500 mg by mouth daily.     No current facility-administered medications for this visit.     OBJECTIVE: Vitals:   10/08/16 1439  BP: 106/76  Pulse: 85  Resp: 18  Temp: 98 F (36.7 C)     Body mass index is 27.25 kg/m.    ECOG FS:0 -  Asymptomatic  General: Well-developed, well-nourished, no acute distress. Eyes: Pink conjunctiva, anicteric sclera. Lungs: Clear to auscultation bilaterally. Heart: Regular rate and rhythm. No rubs, murmurs, or gallops. Abdomen: Soft, nontender, nondistended. No organomegaly noted, normoactive bowel sounds. Musculoskeletal: No edema, cyanosis, or clubbing. Neuro: Alert, answering all questions appropriately. Cranial nerves grossly intact. Skin: No rashes or petechiae noted. Psych: Normal affect.   LAB RESULTS:  Lab Results  Component Value Date   NA 136 05/26/2016   K 4.5 05/26/2016   CL 105 05/26/2016   CO2 26 05/26/2016   GLUCOSE 134 (H) 05/26/2016   BUN 22 (H) 05/26/2016   CREATININE 1.15 05/26/2016   CALCIUM 9.0 05/26/2016   PROT 6.9 09/01/2014   ALBUMIN 3.7 09/01/2014   AST 40 (H)  09/01/2014   ALT 61 09/01/2014   ALKPHOS 59 09/01/2014   BILITOT 0.3 09/01/2014   GFRNONAA >60 05/26/2016   GFRAA >60 05/26/2016    Lab Results  Component Value Date   WBC 8.2 09/02/2016   NEUTROABS 3.3 05/26/2016   HGB 14.0 09/02/2016   HCT 41.2 09/02/2016   MCV 89.1 09/02/2016   PLT 322 09/02/2016   Lab Results  Component Value Date   IRON 65 09/02/2016   TIBC 426 09/02/2016   IRONPCTSAT 15 (L) 09/02/2016   Lab Results  Component Value Date   FERRITIN 81 09/02/2016     STUDIES: No results found.  ASSESSMENT: Chronic anemia, unspecified  PLAN:   1. Chronic anemia, unspecified: Patient's hemoglobin is now within normal limits. He has a mildly decreased iron saturation and is insisting to receive IV Feraheme today. Proceed with 510 mg IV Feraheme today. He has also been instructed to continue his oral iron supplementation 3 times a day, folic acid 1 mg daily as well as monthly B-12 shots. Patient states he can only come to clinic on Fridays, therefore return to clinic in 3 months on a Friday which will have to be with a different provider.  Approximately 30 minutes was  spent in discussion of which greater than 50% was consultation.  Patient expressed understanding and was in agreement with this plan. He also understands that He can call clinic at any time with any questions, concerns, or complaints.    Lloyd Huger, MD   10/12/2016 11:31 AM

## 2016-10-08 ENCOUNTER — Inpatient Hospital Stay: Payer: BLUE CROSS/BLUE SHIELD | Attending: Oncology | Admitting: Oncology

## 2016-10-08 ENCOUNTER — Inpatient Hospital Stay: Payer: BLUE CROSS/BLUE SHIELD

## 2016-10-08 VITALS — BP 106/76 | HR 85 | Temp 98.0°F | Resp 18 | Wt 184.5 lb

## 2016-10-08 DIAGNOSIS — N189 Chronic kidney disease, unspecified: Secondary | ICD-10-CM | POA: Diagnosis not present

## 2016-10-08 DIAGNOSIS — Z87442 Personal history of urinary calculi: Secondary | ICD-10-CM | POA: Diagnosis not present

## 2016-10-08 DIAGNOSIS — D649 Anemia, unspecified: Secondary | ICD-10-CM

## 2016-10-08 DIAGNOSIS — G4730 Sleep apnea, unspecified: Secondary | ICD-10-CM

## 2016-10-08 DIAGNOSIS — Z7984 Long term (current) use of oral hypoglycemic drugs: Secondary | ICD-10-CM | POA: Diagnosis not present

## 2016-10-08 DIAGNOSIS — I129 Hypertensive chronic kidney disease with stage 1 through stage 4 chronic kidney disease, or unspecified chronic kidney disease: Secondary | ICD-10-CM

## 2016-10-08 DIAGNOSIS — J45990 Exercise induced bronchospasm: Secondary | ICD-10-CM | POA: Diagnosis not present

## 2016-10-08 DIAGNOSIS — K219 Gastro-esophageal reflux disease without esophagitis: Secondary | ICD-10-CM | POA: Diagnosis not present

## 2016-10-08 DIAGNOSIS — E785 Hyperlipidemia, unspecified: Secondary | ICD-10-CM | POA: Diagnosis not present

## 2016-10-08 DIAGNOSIS — E1122 Type 2 diabetes mellitus with diabetic chronic kidney disease: Secondary | ICD-10-CM | POA: Diagnosis not present

## 2016-10-08 DIAGNOSIS — D508 Other iron deficiency anemias: Secondary | ICD-10-CM

## 2016-10-08 DIAGNOSIS — Z79899 Other long term (current) drug therapy: Secondary | ICD-10-CM

## 2016-10-08 DIAGNOSIS — Z9989 Dependence on other enabling machines and devices: Secondary | ICD-10-CM

## 2016-10-08 LAB — URINALYSIS COMPLETE WITH MICROSCOPIC (ARMC ONLY)
BACTERIA UA: NONE SEEN
Bilirubin Urine: NEGATIVE
GLUCOSE, UA: 150 mg/dL — AB
HGB URINE DIPSTICK: NEGATIVE
Ketones, ur: NEGATIVE mg/dL
LEUKOCYTES UA: NEGATIVE
NITRITE: NEGATIVE
PH: 5 (ref 5.0–8.0)
Protein, ur: NEGATIVE mg/dL
SPECIFIC GRAVITY, URINE: 1.011 (ref 1.005–1.030)
SQUAMOUS EPITHELIAL / LPF: NONE SEEN

## 2016-10-08 MED ORDER — FERUMOXYTOL INJECTION 510 MG/17 ML
510.0000 mg | Freq: Once | INTRAVENOUS | Status: AC
  Administered 2016-10-08: 510 mg via INTRAVENOUS
  Filled 2016-10-08: qty 17

## 2016-10-08 MED ORDER — SODIUM CHLORIDE 0.9 % IV SOLN
Freq: Once | INTRAVENOUS | Status: AC
  Administered 2016-10-08: 16:00:00 via INTRAVENOUS

## 2016-10-08 NOTE — Progress Notes (Signed)
Offers no complaints. Feeling well. 

## 2016-10-09 LAB — URINE CULTURE

## 2016-11-18 ENCOUNTER — Telehealth: Payer: Self-pay | Admitting: *Deleted

## 2016-11-18 NOTE — Telephone Encounter (Signed)
Concerned because his RBC and HGB continue to be low. Asking if there is anything he can do about it.   Per Dr Grayland Ormond, his counts are fine and he is to continue oral iron , can refer to dietician if he wishes to see one.   Called and got VM, left message of Dr Virgel Manifold response and that can be referred to dietician and to call me back if he desires that

## 2017-01-15 ENCOUNTER — Inpatient Hospital Stay: Payer: BLUE CROSS/BLUE SHIELD

## 2017-01-15 ENCOUNTER — Inpatient Hospital Stay: Payer: BLUE CROSS/BLUE SHIELD | Admitting: Oncology

## 2017-01-15 ENCOUNTER — Other Ambulatory Visit: Payer: Self-pay | Admitting: *Deleted

## 2017-01-15 DIAGNOSIS — D509 Iron deficiency anemia, unspecified: Secondary | ICD-10-CM

## 2017-02-05 ENCOUNTER — Inpatient Hospital Stay: Payer: BLUE CROSS/BLUE SHIELD

## 2017-02-05 ENCOUNTER — Inpatient Hospital Stay: Payer: BLUE CROSS/BLUE SHIELD | Admitting: Oncology

## 2017-04-08 ENCOUNTER — Encounter: Payer: Self-pay | Admitting: Emergency Medicine

## 2017-04-08 ENCOUNTER — Emergency Department: Payer: Managed Care, Other (non HMO)

## 2017-04-08 ENCOUNTER — Other Ambulatory Visit: Payer: Self-pay

## 2017-04-08 ENCOUNTER — Observation Stay
Admission: EM | Admit: 2017-04-08 | Discharge: 2017-04-09 | Disposition: A | Discharge status: Home / Self Care | Payer: Managed Care, Other (non HMO) | Attending: Internal Medicine | Admitting: Internal Medicine

## 2017-04-08 DIAGNOSIS — I129 Hypertensive chronic kidney disease with stage 1 through stage 4 chronic kidney disease, or unspecified chronic kidney disease: Secondary | ICD-10-CM | POA: Diagnosis not present

## 2017-04-08 DIAGNOSIS — Z7984 Long term (current) use of oral hypoglycemic drugs: Secondary | ICD-10-CM | POA: Diagnosis not present

## 2017-04-08 DIAGNOSIS — N189 Chronic kidney disease, unspecified: Secondary | ICD-10-CM | POA: Insufficient documentation

## 2017-04-08 DIAGNOSIS — R079 Chest pain, unspecified: Secondary | ICD-10-CM

## 2017-04-08 DIAGNOSIS — G4733 Obstructive sleep apnea (adult) (pediatric): Secondary | ICD-10-CM | POA: Diagnosis not present

## 2017-04-08 DIAGNOSIS — Z79899 Other long term (current) drug therapy: Secondary | ICD-10-CM | POA: Insufficient documentation

## 2017-04-08 DIAGNOSIS — G8929 Other chronic pain: Secondary | ICD-10-CM | POA: Diagnosis not present

## 2017-04-08 DIAGNOSIS — M25569 Pain in unspecified knee: Secondary | ICD-10-CM | POA: Diagnosis not present

## 2017-04-08 DIAGNOSIS — D631 Anemia in chronic kidney disease: Secondary | ICD-10-CM | POA: Diagnosis not present

## 2017-04-08 DIAGNOSIS — Z87442 Personal history of urinary calculi: Secondary | ICD-10-CM | POA: Diagnosis not present

## 2017-04-08 DIAGNOSIS — E781 Pure hyperglyceridemia: Secondary | ICD-10-CM | POA: Diagnosis not present

## 2017-04-08 DIAGNOSIS — E1122 Type 2 diabetes mellitus with diabetic chronic kidney disease: Secondary | ICD-10-CM | POA: Diagnosis not present

## 2017-04-08 DIAGNOSIS — E785 Hyperlipidemia, unspecified: Secondary | ICD-10-CM | POA: Diagnosis not present

## 2017-04-08 DIAGNOSIS — J45909 Unspecified asthma, uncomplicated: Secondary | ICD-10-CM | POA: Diagnosis not present

## 2017-04-08 DIAGNOSIS — K219 Gastro-esophageal reflux disease without esophagitis: Secondary | ICD-10-CM | POA: Diagnosis not present

## 2017-04-08 DIAGNOSIS — Z888 Allergy status to other drugs, medicaments and biological substances status: Secondary | ICD-10-CM | POA: Insufficient documentation

## 2017-04-08 DIAGNOSIS — I200 Unstable angina: Secondary | ICD-10-CM | POA: Diagnosis not present

## 2017-04-08 LAB — LIPID PANEL
CHOLESTEROL: 201 mg/dL — AB (ref 0–200)
HDL: 26 mg/dL — AB (ref >40)
LDL Cholesterol: 111 mg/dL — ABNORMAL HIGH (ref 0–99)
TRIGLYCERIDES: 321 mg/dL — AB (ref <150)
Total CHOL/HDL Ratio: 7.7 RATIO
VLDL: 64 mg/dL — ABNORMAL HIGH (ref 0–40)

## 2017-04-08 LAB — GLUCOSE, CAPILLARY
GLUCOSE-CAPILLARY: 110 mg/dL — AB (ref 65–99)
Glucose-Capillary: 106 mg/dL — ABNORMAL HIGH (ref 65–99)
Glucose-Capillary: 118 mg/dL — ABNORMAL HIGH (ref 65–99)

## 2017-04-08 LAB — CBC
HCT: 37.8 % — ABNORMAL LOW (ref 40.0–52.0)
HEMOGLOBIN: 12.7 g/dL — AB (ref 13.0–18.0)
MCH: 30.3 pg (ref 26.0–34.0)
MCHC: 33.7 g/dL (ref 32.0–36.0)
MCV: 89.9 fL (ref 80.0–100.0)
PLATELETS: 314 10*3/uL (ref 150–440)
RBC: 4.2 MIL/uL — ABNORMAL LOW (ref 4.40–5.90)
RDW: 13.3 % (ref 11.5–14.5)
WBC: 6.9 10*3/uL (ref 3.8–10.6)

## 2017-04-08 LAB — BASIC METABOLIC PANEL
ANION GAP: 9 (ref 5–15)
BUN: 16 mg/dL (ref 6–20)
CALCIUM: 9.3 mg/dL (ref 8.9–10.3)
CO2: 22 mmol/L (ref 22–32)
Chloride: 108 mmol/L (ref 101–111)
Creatinine, Ser: 1.13 mg/dL (ref 0.61–1.24)
Glucose, Bld: 181 mg/dL — ABNORMAL HIGH (ref 65–99)
Potassium: 4 mmol/L (ref 3.5–5.1)
Sodium: 139 mmol/L (ref 135–145)

## 2017-04-08 LAB — TROPONIN I

## 2017-04-08 MED ORDER — NITROGLYCERIN 0.4 MG SL SUBL: 0.4000 mg | SUBLINGUAL_TABLET | SUBLINGUAL | Status: DC | PRN

## 2017-04-08 MED ORDER — SENNOSIDES-DOCUSATE SODIUM 8.6-50 MG PO TABS: 1.0000 | ORAL_TABLET | Freq: Every evening | ORAL | Status: DC | PRN

## 2017-04-08 MED ORDER — HYDROCODONE-ACETAMINOPHEN 5-325 MG PO TABS
1.0000 | ORAL_TABLET | ORAL | Status: DC | PRN
  Administered 2017-04-08 (×2): 1 via ORAL
  Filled 2017-04-08: qty 1
  Filled 2017-04-08: qty 2

## 2017-04-08 MED ORDER — VITAMIN C 500 MG PO TABS
500.0000 mg | ORAL_TABLET | Freq: Every day | ORAL | Status: DC
  Administered 2017-04-09: 500 mg via ORAL
  Filled 2017-04-08: qty 1

## 2017-04-08 MED ORDER — ONDANSETRON HCL 4 MG PO TABS: 4.0000 mg | ORAL_TABLET | Freq: Four times a day (QID) | ORAL | Status: DC | PRN

## 2017-04-08 MED ORDER — ADULT MULTIVITAMIN W/MINERALS CH
1.0000 | ORAL_TABLET | Freq: Every day | ORAL | Status: DC
  Administered 2017-04-09: 1 via ORAL
  Filled 2017-04-08: qty 1

## 2017-04-08 MED ORDER — LOSARTAN POTASSIUM 50 MG PO TABS
50.0000 mg | ORAL_TABLET | Freq: Every day | ORAL | Status: DC
  Administered 2017-04-09: 50 mg via ORAL
  Filled 2017-04-08: qty 1

## 2017-04-08 MED ORDER — PANTOPRAZOLE SODIUM 40 MG PO TBEC
40.0000 mg | DELAYED_RELEASE_TABLET | Freq: Two times a day (BID) | ORAL | Status: DC
  Administered 2017-04-08: 40 mg via ORAL
  Filled 2017-04-08: qty 1

## 2017-04-08 MED ORDER — FOLIC ACID 1 MG PO TABS
1.0000 mg | ORAL_TABLET | Freq: Every day | ORAL | Status: DC
  Administered 2017-04-09: 1 mg via ORAL
  Filled 2017-04-08: qty 1

## 2017-04-08 MED ORDER — ACETAMINOPHEN 325 MG PO TABS: 650.0000 mg | ORAL_TABLET | Freq: Four times a day (QID) | ORAL | Status: DC | PRN

## 2017-04-08 MED ORDER — ACETAMINOPHEN 500 MG PO TABS
1000.0000 mg | ORAL_TABLET | Freq: Once | ORAL | Status: DC
  Filled 2017-04-08: qty 2

## 2017-04-08 MED ORDER — FLUTICASONE PROPIONATE 50 MCG/ACT NA SUSP
2.0000 | Freq: Every day | NASAL | Status: DC | PRN
  Filled 2017-04-08: qty 16

## 2017-04-08 MED ORDER — INSULIN ASPART 100 UNIT/ML ~~LOC~~ SOLN
0.0000 [IU] | Freq: Three times a day (TID) | SUBCUTANEOUS | Status: DC
  Administered 2017-04-08: 0 [IU] via SUBCUTANEOUS
  Administered 2017-04-09: 1 [IU] via SUBCUTANEOUS
  Filled 2017-04-08: qty 1

## 2017-04-08 MED ORDER — SODIUM CHLORIDE 0.9 % IV BOLUS (SEPSIS)
1000.0000 mL | Freq: Once | INTRAVENOUS | Status: AC
  Administered 2017-04-08: 1000 mL via INTRAVENOUS

## 2017-04-08 MED ORDER — NITROGLYCERIN 0.4 MG SL SUBL
0.4000 mg | SUBLINGUAL_TABLET | SUBLINGUAL | Status: DC | PRN
  Administered 2017-04-08: 0.4 mg via SUBLINGUAL
  Filled 2017-04-08: qty 1

## 2017-04-08 MED ORDER — ASPIRIN EC 81 MG PO TBEC
81.0000 mg | DELAYED_RELEASE_TABLET | Freq: Every day | ORAL | Status: DC
  Administered 2017-04-08 – 2017-04-09 (×2): 81 mg via ORAL
  Filled 2017-04-08 (×2): qty 1

## 2017-04-08 MED ORDER — ENOXAPARIN SODIUM 40 MG/0.4ML ~~LOC~~ SOLN
40.0000 mg | SUBCUTANEOUS | Status: DC
  Administered 2017-04-08: 40 mg via SUBCUTANEOUS
  Filled 2017-04-08: qty 0.4

## 2017-04-08 MED ORDER — GLIMEPIRIDE 2 MG PO TABS
4.0000 mg | ORAL_TABLET | Freq: Every day | ORAL | Status: DC
  Filled 2017-04-08: qty 2

## 2017-04-08 MED ORDER — ASPIRIN 81 MG PO CHEW
243.0000 mg | CHEWABLE_TABLET | Freq: Once | ORAL | Status: AC
  Administered 2017-04-08: 243 mg via ORAL
  Filled 2017-04-08: qty 3

## 2017-04-08 MED ORDER — FERROUS GLUCONATE 324 (38 FE) MG PO TABS
324.0000 mg | ORAL_TABLET | Freq: Three times a day (TID) | ORAL | Status: DC
  Administered 2017-04-09 (×2): 324 mg via ORAL
  Filled 2017-04-08 (×3): qty 1

## 2017-04-08 MED ORDER — ACETAMINOPHEN 500 MG PO TABS
1000.0000 mg | ORAL_TABLET | Freq: Once | ORAL | Status: AC
  Administered 2017-04-08: 1000 mg via ORAL

## 2017-04-08 MED ORDER — ACETAMINOPHEN 650 MG RE SUPP: 650.0000 mg | Freq: Four times a day (QID) | RECTAL | Status: DC | PRN

## 2017-04-08 MED ORDER — ONDANSETRON HCL 4 MG/2ML IJ SOLN: 4.0000 mg | Freq: Four times a day (QID) | INTRAMUSCULAR | Status: DC | PRN

## 2017-04-08 NOTE — ED Notes (Signed)
Pt states 1 nitro relieved CP slightly but now c/o headache, MD awre, see orders

## 2017-04-08 NOTE — ED Provider Notes (Addendum)
[facility] Emergency Department Provider Note  ____________________________________________  Time seen: Approximately 11:57 AM  I have reviewed the triage vital signs and the nursing notes.   HISTORY  Chief Complaint Chest Pain   HPI Jesse Alvarado is a 51 y.o. male with a history of anemia, diabetes, hypertension, hyperlipidemia, but she could've sleep apnea who presents for evaluation of chest pain. Patient reports this is his third episode of chest pain in the last week. All 3 episodes happen at rest. He describes the chest pressure, located in the center of his chest, radiating to bilateral ears, constant, associated with mild shortness of breath. No dizziness, no diaphoresis, no nausea or vomiting. Patient has a strong family history of ischemic heart disease in his paternal side. Patient has never seen a cardiologist or underwent a stress test. He is not a smoker. He reports that the episode today has been present since he woke up and is currently mild which prompted his visit to the emergency room.  Past Medical History:  Diagnosis Date  . Anemia    taking 3 iron pills a day  . Arthritis   . Asthma    sports induced asthma. takes inhalers when needed  . Cancer (HCC)    Basal cell  . Chronic kidney disease    kidney stones  . Diabetes mellitus without complication ([place])   . Diverticulosis   . Erectile dysfunction   . GERD (gastroesophageal reflux disease)   . Hyperlipidemia   . Hypertension    per patient, he does not have high bp but is treated for his kidneys and his diabetes  . Kidney stone   . Sleep apnea    use C-PAP    Patient Active Problem List   Diagnosis Date Noted  . OSA on CPAP 04/18/2016  . Nephrolithiasis 04/13/2016  . Chronic midline low back pain without sciatica 01/25/2016  . Primary osteoarthritis involving multiple joints 11/20/2015  . Bilateral carpal tunnel syndrome 09/14/2015  . Chronic anemia 09/14/2015  .  Essential hypertension 09/14/2015  . Chronic knee pain 08/30/2014  . Erectile dysfunction 08/30/2014  . Other allergic rhinitis 08/30/2014  . Pain in both hands 08/30/2014  . Onychomycosis of toenail 07/24/2014  . Pain in shoulder 05/01/2014  . Hypertriglyceridemia 04/23/2014  . Type 2 diabetes mellitus ([place]) 04/23/2014    Past Surgical History:  Procedure Laterality Date  . CYSTOSCOPY WITH STENT PLACEMENT Bilateral 06/05/2016   Procedure: CYSTOSCOPY WITH STENT PLACEMENT;  Surgeon: Nickie Retort, MD;  Location: [facility] ORS;  Service: Urology;  Laterality: Bilateral;  . CYSTOSCOPY/URETEROSCOPY/HOLMIUM LASER/STENT PLACEMENT Left 04/13/2016   Procedure: CYSTOSCOPY/RETROGRADE PYELOGRAM/URETEROSCOPY WITH HOLMIUM LASER LITHOTRIPSY//STENT PLACEMENT;  Surgeon: Festus Aloe, MD;  Location: [facility] ORS;  Service: Urology;  Laterality: Left;  . ESOPHAGOGASTRODUODENOSCOPY (EGD) WITH PROPOFOL N/A 12/27/2015   Procedure: ESOPHAGOGASTRODUODENOSCOPY (EGD) WITH PROPOFOL;  Surgeon: Manya Silvas, MD;  Location: [facility] ENDOSCOPY;  Service: Endoscopy;  Laterality: N/A;  . HERNIA REPAIR  0017   umbilical  . KNEE ARTHROSCOPY Right 2015   had bursa sack repaired  . PREPATELLAR BURSA EXCISION Left 1993   and ostetomy. had at least 5 surgeries in 15 years  . SHOULDER SURGERY Right 2011   tendon was shredded and was trimmed, repaired and reattached. Screws in shoulder  . URETEROSCOPY WITH HOLMIUM LASER LITHOTRIPSY Bilateral 06/05/2016   Procedure: URETEROSCOPY WITH HOLMIUM LASER LITHOTRIPSY;  Surgeon: Nickie Retort, MD;  Location: [facility] ORS;  Service: Urology;  Laterality: Bilateral;  . VASECTOMY  2002    Prior to Admission medications   Medication Sig Start Date End Date Taking? Authorizing Provider  benzonatate (TESSALON) 200 MG capsule Take 200 mg by mouth 3 (three) times daily as needed for cough.    [provider]  CICLOPIROX EX Apply 1 application topically at bedtime.    [provider]  fenofibrate (TRICOR) 145 MG tablet Take 145 mg by mouth daily.  07/16/16   [provider]  ferrous gluconate (FERGON) 324 MG tablet TAKE 1 TABLET BY MOUTH THREE TIMES DAILY WITH MEALS 07/27/16   [provider]  fluticasone (FLONASE) 50 MCG/ACT nasal spray Place 2 sprays into both nostrils daily as needed for allergies.     [provider]  folic acid (FOLVITE) 1 MG tablet Take 1 mg by mouth daily.  07/16/16 07/16/17  [provider]  glimepiride (AMARYL) 4 MG tablet Take 4 mg by mouth daily with breakfast.  07/31/16   [provider]  HYDROcodone-acetaminophen (NORCO/VICODIN) 5-325 MG tablet Take 1 tablet by mouth every 6 (six) hours as needed for moderate pain.    [provider]  ketorolac (TORADOL) 10 MG tablet Take 10 mg by mouth every 6 (six) hours as needed for moderate pain. Reported on 05/22/2016    [provider]  levocetirizine (XYZAL) 5 MG tablet Take 5 mg by mouth daily. 03/25/16   [provider]  losartan (COZAAR) 25 MG tablet Take 25 mg by mouth daily.    [provider]  metFORMIN (GLUCOPHAGE) 850 MG tablet Take 850 mg by mouth 3 (three) times daily.    [provider]  Multiple Vitamin (MULTI-VITAMINS) TABS Take 1 tablet by mouth daily.     [provider]  pantoprazole (PROTONIX) 40 MG tablet Take 40 mg by mouth 2 (two) times daily.     [provider]  sildenafil (VIAGRA) 100 MG tablet Take 100 mg by mouth daily as needed for erectile dysfunction.    [provider]  tiZANidine (ZANAFLEX) 2 MG tablet Take 2 mg by mouth at bedtime as needed for muscle spasms.     [provider]  traMADol (ULTRAM) 50 MG tablet Take 50 mg by mouth every 6 (six) hours as needed for moderate pain. Reported on 05/22/2016    [provider]  vitamin C (ASCORBIC ACID) 500 MG tablet Take 500 mg by mouth daily.    [provider]     Allergies Ciprofloxacin  Family History  Problem Relation Age of Onset  . Urolithiasis Father   . Kidney disease Father   . Prostate cancer Neg Hx   . Kidney cancer Neg Hx     Social History Social History  Substance Use Topics  . Smoking status: Never Smoker  . Smokeless tobacco: Never Used  . Alcohol use No     Comment: very rare etoh     Review of Systems  Constitutional: Negative for fever. Eyes: Negative for visual changes. ENT: Negative for sore throat. Neck: No neck pain  Cardiovascular: + chest pain. Respiratory: + shortness of breath. Gastrointestinal: Negative for abdominal pain, vomiting or diarrhea. Genitourinary: Negative for dysuria. Musculoskeletal: Negative for back pain. Skin: Negative for rash. Neurological: Negative for headaches, weakness or numbness. Psych: No SI or HI  ____________________________________________   PHYSICAL EXAM:  VITAL SIGNS: ED Triage Vitals  Enc Vitals Group     BP 04/08/17 1048 (!) 149/79     Pulse Rate 04/08/17 1048 96  Resp 04/08/17 1048 20     Temp 04/08/17 1048 98.3 F (36.8 C)     Temp Source 04/08/17 1048 Oral     SpO2 04/08/17 1048 96 %     Weight 04/08/17 1048 182 lb (82.6 kg)     Height 04/08/17 1048 5' 9.5" (1.765 m)     Head Circumference --      Peak Flow --      Pain Score 04/08/17 1054 3     Pain Loc --      Pain Edu? --      Excl. in [place]? --     Constitutional: Alert and oriented. Well appearing and in no apparent distress. HEENT:      Head: Normocephalic and atraumatic.         Eyes: Conjunctivae are normal. Sclera is non-icteric.       Mouth/Throat: Mucous membranes are moist.       Neck: Supple with no signs of meningismus. Cardiovascular: Regular rate and rhythm. No murmurs, gallops, or rubs. 2+ symmetrical distal pulses are present in all extremities. No JVD. Respiratory: Normal respiratory effort. Lungs are clear to auscultation bilaterally. No wheezes, crackles, or rhonchi.   Gastrointestinal: Soft, non tender, and non distended with positive bowel sounds. No rebound or guarding. Genitourinary: No CVA tenderness. Musculoskeletal: Nontender with normal range of motion in all extremities. No edema, cyanosis, or erythema of extremities. Neurologic: Normal speech and language. Face is symmetric. Moving all extremities. No gross focal neurologic deficits are appreciated. Skin: Skin is warm, dry and intact. No rash noted. Psychiatric: Mood and affect are normal. Speech and behavior are normal.  ____________________________________________   LABS (all labs ordered are listed, but only abnormal results are displayed)  Labs Reviewed  BASIC METABOLIC PANEL - Abnormal; Notable for the following:       Result Value   Glucose, Bld 181 (*)    All other components within normal limits  CBC - Abnormal; Notable for the following:    RBC 4.20 (*)    Hemoglobin 12.7 (*)    HCT 37.8 (*)    All other components within normal limits  TROPONIN I   ____________________________________________  EKG  ED ECG REPORT I, Rudene Re, the attending physician, personally viewed and interpreted this ECG.  Normal sinus rhythm, rate of 86, normal intervals, normal axis, no ST elevations or depressions. Normal EKG.   12:05 - Normal sinus rhythm, rate of 95, normal intervals, normal axis, no ST elevations or depressions. Unchanged from prior. ____________________________________________  RADIOLOGY  CXR: Low lung volumes with mild bibasilar atelectasis and/or scarring.  ____________________________________________   PROCEDURES  Procedure(s) performed: None Procedures Critical Care performed:  None ____________________________________________   INITIAL IMPRESSION / ASSESSMENT AND PLAN / ED COURSE   50 y.o. male with a history of anemia, diabetes, hypertension, hyperlipidemia, but she could've sleep apnea who presents for evaluation of chest pain.  History of chest  pain concerning for possible cardiac etiology. Exam is benign. EKG and initial cardiac labs are without evidence of ischemia. Clinical picture and evaluation not suggestive of other serious cause including pneumonia, pulmonary embolism, pneumothorax, esophageal rupture, or aortic dissection. Patient will be admitted for chest pain evaluation. Patient will be given ASA and nitroglycerine. Patient on viagra however has not taken it for the last 3 days.   I discussed the diagnosis and plan of care with the patient and answered all his questions. He states understanding and is in agreement with admission for further work  up.      Pertinent labs & imaging results that were available during my care of the patient were reviewed by me and considered in my medical decision making (see chart for details).    ____________________________________________   FINAL CLINICAL IMPRESSION(S) / ED DIAGNOSES  Final diagnoses:  Unstable angina (HCC)      NEW MEDICATIONS STARTED DURING THIS VISIT:  New Prescriptions   No medications on file     Note:  This document was prepared using Dragon voice recognition software and may include unintentional dictation errors.    Rudene Re, MD 04/08/17 [place], [place], MD 04/08/17 [phone]

## 2017-04-08 NOTE — ED Triage Notes (Signed)
Pt with left sided chest pain with shortness breath on and off for one week.

## 2017-04-08 NOTE — H&P (Signed)
[place] [facility] [place] NAME: Jesse Alvarado    MR#:  [id number]  DATE OF BIRTH:  August 05, 1966  DATE OF ADMISSION:  04/08/2017  PRIMARY CARE PHYSICIAN: Glendon Axe, MD   REQUESTING/REFERRING PHYSICIAN: dr Alfred Levins  CHIEF COMPLAINT:   Chest pain HISTORY OF PRESENT ILLNESS:  Jesse Alvarado  is a 52 y.o. male with a known history of Diabetes and essential hypertension who presents with above complaint. Patient reports over the past few days he has had intermittent chest pain. He reports the chest pain is substernal in nature without radiation although today's chest pain did radiate to his jaw. He denies shortness of breath, nausea or diaphoresis associated chest pain. The chest pain is located substernally. No relieving or aggravating factors. Each chest pain episode lasts approximately 5-10 minutes. He is currently chest pain-free. He reports to the emergency room today because the chest pain was longer in duration and radiated to his jaw. EKG shows normal sinus rhythm and first set of troponins are negative.  PAST MEDICAL HISTORY:   Past Medical History:  Diagnosis Date  . Anemia    taking 3 iron pills a day  . Arthritis   . Asthma    sports induced asthma. takes inhalers when needed  . Cancer (HCC)    Basal cell  . Chronic kidney disease    kidney stones  . Diabetes mellitus without complication ([place])   . Diverticulosis   . Erectile dysfunction   . GERD (gastroesophageal reflux disease)   . Hyperlipidemia   . Hypertension    per patient, he does not have high bp but is treated for his kidneys and his diabetes  . Kidney stone   . Sleep apnea    use C-PAP    PAST SURGICAL HISTORY:   Past Surgical History:  Procedure Laterality Date  . CYSTOSCOPY WITH STENT PLACEMENT Bilateral 06/05/2016   Procedure: CYSTOSCOPY WITH STENT PLACEMENT;  Surgeon: Nickie Retort, MD;  Location: [facility] ORS;  Service: Urology;  Laterality: Bilateral;  .  CYSTOSCOPY/URETEROSCOPY/HOLMIUM LASER/STENT PLACEMENT Left 04/13/2016   Procedure: CYSTOSCOPY/RETROGRADE PYELOGRAM/URETEROSCOPY WITH HOLMIUM LASER LITHOTRIPSY//STENT PLACEMENT;  Surgeon: Festus Aloe, MD;  Location: [facility] ORS;  Service: Urology;  Laterality: Left;  . ESOPHAGOGASTRODUODENOSCOPY (EGD) WITH PROPOFOL N/A 12/27/2015   Procedure: ESOPHAGOGASTRODUODENOSCOPY (EGD) WITH PROPOFOL;  Surgeon: Manya Silvas, MD;  Location: [facility] ENDOSCOPY;  Service: Endoscopy;  Laterality: N/A;  . HERNIA REPAIR  0814   umbilical  . KNEE ARTHROSCOPY Right 2015   had bursa sack repaired  . PREPATELLAR BURSA EXCISION Left 1993   and ostetomy. had at least 5 surgeries in 15 years  . SHOULDER SURGERY Right 2011   tendon was shredded and was trimmed, repaired and reattached. Screws in shoulder  . URETEROSCOPY WITH HOLMIUM LASER LITHOTRIPSY Bilateral 06/05/2016   Procedure: URETEROSCOPY WITH HOLMIUM LASER LITHOTRIPSY;  Surgeon: Nickie Retort, MD;  Location: [facility] ORS;  Service: Urology;  Laterality: Bilateral;  . VASECTOMY  2002    SOCIAL HISTORY:   Social History  Substance Use Topics  . Smoking status: Never Smoker  . Smokeless tobacco: Never Used  . Alcohol use No     Comment: very rare etoh     FAMILY HISTORY:   Family History  Problem Relation Age of Onset  . Urolithiasis Father   . Kidney disease Father   . Prostate cancer Neg Hx   . Kidney cancer Neg Hx     DRUG ALLERGIES:   Allergies  Allergen Reactions  . Ciprofloxacin Other (See Comments)    GI upset    REVIEW OF SYSTEMS:   Review of Systems  Constitutional: Negative.  Negative for chills, fever and malaise/fatigue.  HENT: Negative.  Negative for ear discharge, ear pain, hearing loss, nosebleeds and sore throat.   Eyes: Negative.  Negative for blurred vision and pain.  Respiratory: Negative.  Negative for cough, hemoptysis, shortness of breath and wheezing.   Cardiovascular: Positive for chest pain. Negative for  palpitations and leg swelling.  Gastrointestinal: Negative.  Negative for abdominal pain, blood in stool, diarrhea, nausea and vomiting.  Genitourinary: Negative.  Negative for dysuria.  Musculoskeletal: Negative.  Negative for back pain.  Skin: Negative.   Neurological: Negative for dizziness, tremors, speech change, focal weakness, seizures and headaches.  Endo/Heme/Allergies: Negative.  Does not bruise/bleed easily.  Psychiatric/Behavioral: Negative.  Negative for depression, hallucinations and suicidal ideas.    MEDICATIONS AT HOME:   Prior to Admission medications   Medication Sig Start Date End Date Taking? Authorizing Provider  benzonatate (TESSALON) 200 MG capsule Take 200 mg by mouth 3 (three) times daily as needed for cough.    [provider]  CICLOPIROX EX Apply 1 application topically at bedtime.    [provider]  fenofibrate (TRICOR) 145 MG tablet Take 145 mg by mouth daily.  07/16/16   [provider]  ferrous gluconate (FERGON) 324 MG tablet TAKE 1 TABLET BY MOUTH THREE TIMES DAILY WITH MEALS 07/27/16   [provider]  fluticasone (FLONASE) 50 MCG/ACT nasal spray Place 2 sprays into both nostrils daily as needed for allergies.     [provider]  folic acid (FOLVITE) 1 MG tablet Take 1 mg by mouth daily.  07/16/16 07/16/17  [provider]  glimepiride (AMARYL) 4 MG tablet Take 4 mg by mouth daily with breakfast.  07/31/16   [provider]  HYDROcodone-acetaminophen (NORCO/VICODIN) 5-325 MG tablet Take 1 tablet by mouth every 6 (six) hours as needed for moderate pain.    [provider]  ketorolac (TORADOL) 10 MG tablet Take 10 mg by mouth every 6 (six) hours as needed for moderate pain. Reported on 05/22/2016    [provider]  levocetirizine (XYZAL) 5 MG tablet Take 5 mg by mouth daily. 03/25/16   [provider]  losartan (COZAAR) 25 MG tablet Take 25 mg by mouth daily.    [provider]  metFORMIN (GLUCOPHAGE) 850 MG tablet Take 850 mg by mouth 3 (three) times daily.    [provider]  Multiple Vitamin (MULTI-VITAMINS) TABS Take 1 tablet by mouth daily.     [provider]  pantoprazole (PROTONIX) 40 MG tablet Take 40 mg by mouth 2 (two) times daily.     [provider]  sildenafil (VIAGRA) 100 MG tablet Take 100 mg by mouth daily as needed for erectile dysfunction.    [provider]  tiZANidine (ZANAFLEX) 2 MG tablet Take 2 mg by mouth at bedtime as needed for muscle spasms.     [provider]  traMADol (ULTRAM) 50 MG tablet Take 50 mg by mouth every 6 (six) hours as needed for moderate pain. Reported on 05/22/2016    [provider]  vitamin C (ASCORBIC ACID) 500 MG tablet Take 500 mg by mouth daily.    [provider]      VITAL SIGNS:  Blood pressure (!) 121/92, pulse 90, temperature 98.3 F (36.8 C), temperature source Oral, resp. rate 16,  height 5' 9.5" (1.765 m), weight 82.6 kg (182 lb), SpO2 97 %.  PHYSICAL EXAMINATION:   Physical Exam  Constitutional: He is oriented to person, place, and time and well-developed, well-nourished, and in no distress. No distress.  HENT:  Head: Normocephalic.  Eyes: No scleral icterus.  Neck: Normal range of motion. Neck supple. No JVD present. No tracheal deviation present.  Cardiovascular: Normal rate, regular rhythm and normal heart sounds.  Exam reveals no gallop and no friction rub.   No murmur heard. Pulmonary/Chest: Effort normal and breath sounds normal. No respiratory distress. He has no wheezes. He has no rales. He exhibits no tenderness.  Abdominal: Soft. Bowel sounds are normal. He exhibits no distension and no mass. There is no tenderness. There is no rebound and no guarding.  Musculoskeletal: Normal range of motion. He exhibits no edema.  Neurological: He is alert and oriented to person, place, and time.  Skin: Skin is warm. No rash noted.  No erythema.  Psychiatric: Affect and judgment normal.      LABORATORY PANEL:   CBC  Recent Labs Lab 04/08/17 1052  WBC 6.9  HGB 12.7*  HCT 37.8*  PLT 314   ------------------------------------------------------------------------------------------------------------------  Chemistries   Recent Labs Lab 04/08/17 1052  NA 139  K 4.0  CL 108  CO2 22  GLUCOSE 181*  BUN 16  CREATININE 1.13  CALCIUM 9.3   ------------------------------------------------------------------------------------------------------------------  Cardiac Enzymes  Recent Labs Lab 04/08/17 1052  TROPONINI <0.03   ------------------------------------------------------------------------------------------------------------------  RADIOLOGY:  Dg Chest 2 View  Result Date: 04/08/2017 CLINICAL DATA:  Chest pain.  Shortness of breath. EXAM: CHEST  2 VIEW COMPARISON:  Prior report 05/31/2014 . FINDINGS: Mediastinum and hilar structures normal. Lungs are clear of acute infiltrates. Mild bibasilar subsegmental atelectasis and/or scarring. No pneumothorax . IMPRESSION: Low lung volumes with mild bibasilar atelectasis and/or scarring. Electronically Signed   By: Marcello Moores  Register   On: 04/08/2017 11:18    EKG:   Normal sinus rhythm no ST elevation or depression  IMPRESSION AND PLAN:   51 year old male with history of OSA on CPAP, diabetes and essential hypertension who presents with chest pain.  1. Chest pain: Patient will undergo telemetry monitoring. If 3 troponins are negative the patient may undergo cardiac stress test in a.m. which has been ordered. Continue aspirin Consider adding beta blocker depending on blood pressure and cardiac stress test results. Check lipid panel hemoglobin A1c  2. OSA: Continue CPAP Wife will bring in patient's CPAP  3. Essential hypertension: Continue losartan  4. Diabetes: Continue outpatient regimen with the exception of metformin which will be on hold for  now. Add sliding scale insulin with ADA diet     All the records are reviewed and case discussed with ED provider. Management plans discussed with the patient and he in agreement  CODE STATUS: full  TOTAL TIME TAKING CARE OF THIS PATIENT: 45 minutes.    Tarita Deshmukh M.D on 04/08/2017 at 12:22 PM  Between 7am to 6pm - Pager - [phone]  After 6pm go to [email or web address] - password [email or web address] [facility]  Office  [phone]  CC: Primary care physician; Glendon Axe, MD

## 2017-04-09 ENCOUNTER — Observation Stay: Payer: Managed Care, Other (non HMO)

## 2017-04-09 ENCOUNTER — Encounter: Payer: Self-pay | Admitting: Radiology

## 2017-04-09 LAB — CBC
HCT: 35.4 % — ABNORMAL LOW (ref 40.0–52.0)
HEMOGLOBIN: 12.2 g/dL — AB (ref 13.0–18.0)
MCH: 31.2 pg (ref 26.0–34.0)
MCHC: 34.5 g/dL (ref 32.0–36.0)
MCV: 90.4 fL (ref 80.0–100.0)
PLATELETS: 272 10*3/uL (ref 150–440)
RBC: 3.92 MIL/uL — AB (ref 4.40–5.90)
RDW: 13.7 % (ref 11.5–14.5)
WBC: 6.7 10*3/uL (ref 3.8–10.6)

## 2017-04-09 LAB — BASIC METABOLIC PANEL
ANION GAP: 5 (ref 5–15)
BUN: 17 mg/dL (ref 6–20)
CHLORIDE: 110 mmol/L (ref 101–111)
CO2: 26 mmol/L (ref 22–32)
CREATININE: 1.14 mg/dL (ref 0.61–1.24)
Calcium: 8.5 mg/dL — ABNORMAL LOW (ref 8.9–10.3)
GFR calc non Af Amer: >60 mL/min (ref >60)
Glucose, Bld: 115 mg/dL — ABNORMAL HIGH (ref 65–99)
Potassium: 3.7 mmol/L (ref 3.5–5.1)
SODIUM: 141 mmol/L (ref 135–145)

## 2017-04-09 LAB — NM MYOCAR MULTI W/SPECT W/WALL MOTION / EF
CHL CUP NUCLEAR SSS: 4
CSEPEDS: 32 s
Estimated workload: 10.9 METS
Exercise duration (min): 9 min
LVDIAVOL: 65 mL (ref 62–150)
LVSYSVOL: 20 mL
MPHR: 168 {beats}/min
Peak HR: 166 {beats}/min
Percent HR: 98 %
Rest HR: 85 {beats}/min
SDS: 2
SRS: 2
TID: 0.66

## 2017-04-09 LAB — HEMOGLOBIN A1C
HEMOGLOBIN A1C: 7.5 % — AB (ref 4.8–5.6)
Mean Plasma Glucose: 169 mg/dL

## 2017-04-09 LAB — TROPONIN I

## 2017-04-09 LAB — HIV ANTIBODY (ROUTINE TESTING W REFLEX): HIV SCREEN 4TH GENERATION: NONREACTIVE

## 2017-04-09 LAB — GLUCOSE, CAPILLARY: GLUCOSE-CAPILLARY: 123 mg/dL — AB (ref 65–99)

## 2017-04-09 MED ORDER — IOPAMIDOL (ISOVUE-370) INJECTION 76%
75.0000 mL | Freq: Once | INTRAVENOUS | Status: AC | PRN
  Administered 2017-04-09: 75 mL via INTRAVENOUS

## 2017-04-09 MED ORDER — TECHNETIUM TC 99M TETROFOSMIN IV KIT
13.0000 | PACK | Freq: Once | INTRAVENOUS | Status: AC | PRN
  Administered 2017-04-09: 13.16 via INTRAVENOUS

## 2017-04-09 MED ORDER — TECHNETIUM TC 99M TETROFOSMIN IV KIT
30.0000 | PACK | Freq: Once | INTRAVENOUS | Status: AC | PRN
  Administered 2017-04-09: 29.7 via INTRAVENOUS

## 2017-04-09 MED ORDER — ASPIRIN 81 MG PO TBEC: 81.0000 mg | DELAYED_RELEASE_TABLET | Freq: Every day | ORAL | 0 refills | Status: DC

## 2017-04-09 NOTE — Discharge Instructions (Signed)
Do Not take Glucophage (metformin) for 48 hours

## 2017-04-09 NOTE — Discharge Summary (Signed)
[facility] [place] NAME: Jesse Alvarado    MR#:  [id number]  DATE OF BIRTH:  Jun 18, 1966  DATE OF ADMISSION:  04/08/2017 ADMITTING PHYSICIAN: Bettey Costa, MD  DATE OF DISCHARGE: 04/09/2017  PRIMARY CARE PHYSICIAN: Glendon Axe, MD    ADMISSION DIAGNOSIS:  Unstable angina ([place]) [I20.0]  DISCHARGE DIAGNOSIS:  Active Problems:   Chest pain   SECONDARY DIAGNOSIS:   Past Medical History:  Diagnosis Date  . Anemia    taking 3 iron pills a day  . Arthritis   . Asthma    sports induced asthma. takes inhalers when needed  . Cancer (HCC)    Basal cell  . Chronic kidney disease    kidney stones  . Diabetes mellitus without complication ([place])   . Diverticulosis   . Erectile dysfunction   . GERD (gastroesophageal reflux disease)   . Hyperlipidemia   . Hypertension    per patient, he does not have high bp but is treated for his kidneys and his diabetes  . Kidney stone   . Sleep apnea    use C-PAP    HOSPITAL COURSE:   1.  Chest pain. Patient had 3 sets of cardiac enzymes that were negative. Patient had a normal stress test that is a low risk and with good ejection fraction. I ended up getting a CT scan of the chest which was negative for dissection and negative for pulmonary embolism. Reviewing CAT scan from last year of the abdomen and the gallbladder was listed as normal. The patient had no right upper quadrant tenderness. The patient states he has a prescription for tramadol at home and that seems to help with this pain. The pain is not reproducible in nature. Follow-up with PMD in 1 week.  Aspirin prescribed. 2. Essential hypertension continue losartan 3. Type 2 diabetes mellitus. Hold metformin for 48 hours after CT scan. Continue other oral medications 4. Obstructive sleep apnea on CPAP 5. History of kidney stones 6. Hyperlipidemia unspecified on fenofibrate 7. History of ED   DISCHARGE CONDITIONS:   Satisfactory  CONSULTS  OBTAINED:   seen by cardiology and at stress testing  DRUG ALLERGIES:   Allergies  Allergen Reactions  . Ciprofloxacin Other (See Comments)    GI upset    DISCHARGE MEDICATIONS:   Current Discharge Medication List    START taking these medications   Details  aspirin EC 81 MG EC tablet Take 1 tablet (81 mg total) by mouth daily. Qty: 30 tablet, Refills: 0      CONTINUE these medications which have NOT CHANGED   Details  benzonatate (TESSALON) 200 MG capsule Take 200 mg by mouth 3 (three) times daily as needed for cough.    CICLOPIROX EX Apply 1 application topically at bedtime.    fenofibrate (TRICOR) 145 MG tablet Take 145 mg by mouth daily.     ferrous gluconate (FERGON) 324 MG tablet TAKE 1 TABLET BY MOUTH THREE TIMES DAILY WITH MEALS    fluticasone (FLONASE) 50 MCG/ACT nasal spray Place 2 sprays into both nostrils daily as needed for allergies.     folic acid (FOLVITE) 1 MG tablet Take 1 mg by mouth daily.     glimepiride (AMARYL) 4 MG tablet Take 4 mg by mouth 2 (two) times daily.     HYDROcodone-acetaminophen (NORCO/VICODIN) 5-325 MG tablet Take 1 tablet by mouth every 6 (six) hours as needed for moderate pain.    JANUVIA 100 MG tablet Take 100 mg  by mouth daily. Refills: 1    ketorolac (TORADOL) 10 MG tablet Take 10 mg by mouth every 6 (six) hours as needed for moderate pain. Reported on 05/22/2016    levocetirizine (XYZAL) 5 MG tablet Take 5 mg by mouth daily. Refills: 11    losartan (COZAAR) 50 MG tablet Take 50 mg by mouth daily.     Multiple Vitamin (MULTI-VITAMINS) TABS Take 1 tablet by mouth daily.     pantoprazole (PROTONIX) 40 MG tablet Take 40 mg by mouth 2 (two) times daily.     sildenafil (VIAGRA) 100 MG tablet Take 100 mg by mouth daily as needed for erectile dysfunction.    tiZANidine (ZANAFLEX) 2 MG tablet Take 2 mg by mouth at bedtime as needed for muscle spasms.     traMADol (ULTRAM) 50 MG tablet Take 50 mg by mouth every 6 (six) hours as  needed for moderate pain. Reported on 05/22/2016    vitamin C (ASCORBIC ACID) 500 MG tablet Take 500 mg by mouth daily.      STOP taking these medications     metFORMIN (GLUCOPHAGE) 850 MG tablet          DISCHARGE INSTRUCTIONS:   Follow-up PMD one week  If you experience worsening of your admission symptoms, develop shortness of breath, life threatening emergency, suicidal or homicidal thoughts you must seek medical attention immediately by calling 911 or calling your MD immediately  if symptoms less severe.  You Must read complete instructions/literature along with all the possible adverse reactions/side effects for all the Medicines you take and that have been prescribed to you. Take any new Medicines after you have completely understood and accept all the possible adverse reactions/side effects.   Please note  You were cared for by a hospitalist during your hospital stay. If you have any questions about your discharge medications or the care you received while you were in the hospital after you are discharged, you can call the unit and asked to speak with the hospitalist on call if the hospitalist that took care of you is not available. Once you are discharged, your primary care physician will handle any further medical issues. Please note that NO REFILLS for any discharge medications will be authorized once you are discharged, as it is imperative that you return to your primary care physician (or establish a relationship with a primary care physician if you do not have one) for your aftercare needs so that they can reassess your need for medications and monitor your lab values.    Today   CHIEF COMPLAINT:   Chief Complaint  Patient presents with  . Chest Pain    HISTORY OF PRESENT ILLNESS:  Jesse Alvarado  is a 51 y.o. male presented with chest pain   VITAL SIGNS:  Blood pressure 116/76, pulse 86, temperature 97.9 F (36.6 C), temperature source Oral, resp. rate 14,  height 5\' 9"  (1.753 m), weight 84.1 kg (185 lb 8 oz), SpO2 95 %.   PHYSICAL EXAMINATION:  GENERAL:  51 y.o.-year-old patient lying in the bed with no acute distress.  EYES: Pupils equal, round, reactive to light and accommodation. No scleral icterus. Extraocular muscles intact.  HEENT: Head atraumatic, normocephalic. Oropharynx and nasopharynx clear.  NECK:  Supple, no jugular venous distention. No thyroid enlargement, no tenderness.  LUNGS: Normal breath sounds bilaterally, no wheezing, rales,rhonchi or crepitation. No use of accessory muscles of respiration.  CARDIOVASCULAR: S1, S2 normal. No murmurs, rubs, or gallops.  ABDOMEN: Soft, non-tender, non-distended. Bowel  sounds present. No organomegaly or mass.  EXTREMITIES: No pedal edema, cyanosis, or clubbing.  NEUROLOGIC: Cranial nerves II through XII are intact. Muscle strength 5/5 in all extremities. Sensation intact. Gait not checked.  PSYCHIATRIC: The patient is alert and oriented x 3.  SKIN: No obvious rash, lesion, or ulcer.   DATA REVIEW:   CBC  Recent Labs Lab 04/09/17 0146  WBC 6.7  HGB 12.2*  HCT 35.4*  PLT 272    Chemistries   Recent Labs Lab 04/09/17 0146  NA 141  K 3.7  CL 110  CO2 26  GLUCOSE 115*  BUN 17  CREATININE 1.14  CALCIUM 8.5*    Cardiac Enzymes  Recent Labs Lab 04/09/17 0146  TROPONINI <0.03      RADIOLOGY:  Dg Chest 2 View  Result Date: 04/08/2017 CLINICAL DATA:  Chest pain.  Shortness of breath. EXAM: CHEST  2 VIEW COMPARISON:  Prior report 05/31/2014 . FINDINGS: Mediastinum and hilar structures normal. Lungs are clear of acute infiltrates. Mild bibasilar subsegmental atelectasis and/or scarring. No pneumothorax . IMPRESSION: Low lung volumes with mild bibasilar atelectasis and/or scarring. Electronically Signed   By: Falcon   On: 04/08/2017 11:18   Ct Angio Chest Pe W Or Wo Contrast  Result Date: 04/09/2017 CLINICAL DATA:  Chest pain and shortness of Breath EXAM: CT  ANGIOGRAPHY CHEST WITH CONTRAST TECHNIQUE: Multidetector CT imaging of the chest was performed using the standard protocol during bolus administration of intravenous contrast. Multiplanar CT image reconstructions and MIPs were obtained to evaluate the vascular anatomy. CONTRAST:  75 mL Isovue 370. COMPARISON:  04/08/2017. FINDINGS: Cardiovascular: Coronary calcifications are noted. The thoracic aorta shows no evidence of aneurysmal dilatation or dissection. The cardiac structures are not significantly enlarged. The pulmonary artery demonstrates a normal branching pattern. No filling defects are identified to suggest pulmonary emboli. Mediastinum/Nodes: The thoracic inlet is within normal limits. No significant hilar or mediastinal adenopathy is seen. The esophagus as visualize is within normal limits. Lungs/Pleura: Mild bibasilar atelectatic changes are seen. No focal confluent infiltrate or sizable effusion is seen. No parenchymal nodules are noted. Upper Abdomen: Mild fatty infiltration of the liver. No other focal abnormality in the upper abdomen is noted. Musculoskeletal: Bony structures are within normal limits. Degenerative changes of the thoracic spine are seen. Review of the MIP images confirms the above findings. IMPRESSION: No evidence of pulmonary emboli. Mild bibasilar atelectatic changes Electronically Signed   By: Inez Catalina M.D.   On: 04/09/2017 15:12   Nm Myocar Multi W/spect W/wall Motion / Ef  Result Date: 04/09/2017  The study is normal.  This is a low risk study.  The left ventricular ejection fraction is normal (55-65%).  There was no ST segment deviation noted during stress.     Management plans discussed with the patient, family and they are in agreement.  CODE STATUS:     Code Status Orders        Start     Ordered   04/08/17 1347  Full code  Continuous     04/08/17 1347    Code Status History    Date Active Date Inactive Code Status Order ID Comments User Context    04/13/2016  7:42 AM 04/14/2016  3:48 PM Full Code [id number]  Harrie Foreman, MD Inpatient      TOTAL TIME TAKING CARE OF THIS PATIENT: 38 minutes.    Loletha Grayer M.D on 04/09/2017 at 3:48 PM  Between 7am to 6pm - Pager -  [phone]  After 6pm go to [email or web address] - password [email or web address] [facility]  [phone]  CC: Primary care physician; Glendon Axe, MD

## 2017-06-25 DIAGNOSIS — K2970 Gastritis, unspecified, without bleeding: Secondary | ICD-10-CM | POA: Insufficient documentation

## 2017-07-15 ENCOUNTER — Encounter: Payer: Self-pay | Admitting: Urology

## 2017-07-15 ENCOUNTER — Ambulatory Visit (INDEPENDENT_AMBULATORY_CARE_PROVIDER_SITE_OTHER): Payer: Managed Care, Other (non HMO) | Admitting: Urology

## 2017-07-15 VITALS — BP 103/69 | HR 108 | Ht 69.0 in | Wt 185.4 lb

## 2017-07-15 DIAGNOSIS — R310 Gross hematuria: Secondary | ICD-10-CM

## 2017-07-15 DIAGNOSIS — N400 Enlarged prostate without lower urinary tract symptoms: Secondary | ICD-10-CM | POA: Diagnosis not present

## 2017-07-15 DIAGNOSIS — N200 Calculus of kidney: Secondary | ICD-10-CM | POA: Diagnosis not present

## 2017-07-15 DIAGNOSIS — Z125 Encounter for screening for malignant neoplasm of prostate: Secondary | ICD-10-CM

## 2017-07-15 LAB — URINALYSIS, COMPLETE
Bilirubin, UA: NEGATIVE
Glucose, UA: NEGATIVE
Ketones, UA: NEGATIVE
Leukocytes, UA: NEGATIVE
NITRITE UA: NEGATIVE
PH UA: 5.5 (ref 5.0–7.5)
Protein, UA: NEGATIVE
RBC, UA: NEGATIVE
Specific Gravity, UA: 1.025 (ref 1.005–1.030)
UUROB: 0.2 mg/dL (ref 0.2–1.0)

## 2017-07-15 NOTE — Progress Notes (Signed)
07/15/2017 9:31 AM   Jesse Alvarado October 16, 1966 [id number]  Referring provider: Glendon Axe, MD [facility] [place], [place] [address]  Chief Complaint  Patient presents with  . Nephrolithiasis    HPI: The patient is a 51 year old gentleman with a past medical history of nephrolithiasis presents today for follow-up.  1. History of nephrolithiasis Bilateral ureteroscopy in July 2017. His stones were 95% calcium oxalate monohydrate 5% calcium phosphate. Post instrumentation renal ultrasound was unremarkable with no sign of hydronephrosis or stones.  He now reports that he is seeing and passing stones at least once per month. He says they're the size of a beebee pellet. He does have pain at times and passing the stones. Very concerned by this. He was not interested in a 24 urine study at his last visit.  2. Gross hematuria The patient notes intermittent gross hematuria even at times it is not passing a stone. He is very concerned by this. This has been going on for close to urinate.  3. BPH The patient has had intermittent straining to urinate. He sometimes feels he doesn't empty his bladder. He is not currently on medication for this. He is more bothered by passing the stones in his hematuria currently.  4. Prostate cancer screening Patient's request prostate cancer screening. He has never had a PSA drawn before.   PMH: Past Medical History:  Diagnosis Date  . Anemia    taking 3 iron pills a day  . Arthritis   . Asthma    sports induced asthma. takes inhalers when needed  . Cancer (HCC)    Basal cell  . Chronic kidney disease    kidney stones  . Diabetes mellitus without complication ([place])   . Diverticulosis   . Erectile dysfunction   . GERD (gastroesophageal reflux disease)   . Hyperlipidemia   . Hypertension    per patient, he does not have high bp but is treated for his kidneys and his diabetes  . Kidney stone   . Sleep apnea    use  C-PAP    Surgical History: Past Surgical History:  Procedure Laterality Date  . CYSTOSCOPY WITH STENT PLACEMENT Bilateral 06/05/2016   Procedure: CYSTOSCOPY WITH STENT PLACEMENT;  Surgeon: Nickie Retort, MD;  Location: [facility] ORS;  Service: Urology;  Laterality: Bilateral;  . CYSTOSCOPY/URETEROSCOPY/HOLMIUM LASER/STENT PLACEMENT Left 04/13/2016   Procedure: CYSTOSCOPY/RETROGRADE PYELOGRAM/URETEROSCOPY WITH HOLMIUM LASER LITHOTRIPSY//STENT PLACEMENT;  Surgeon: Festus Aloe, MD;  Location: [facility] ORS;  Service: Urology;  Laterality: Left;  . ESOPHAGOGASTRODUODENOSCOPY (EGD) WITH PROPOFOL N/A 12/27/2015   Procedure: ESOPHAGOGASTRODUODENOSCOPY (EGD) WITH PROPOFOL;  Surgeon: Manya Silvas, MD;  Location: [facility] ENDOSCOPY;  Service: Endoscopy;  Laterality: N/A;  . HERNIA REPAIR  [id number]   umbilical  . KNEE ARTHROSCOPY Right 2015   had bursa sack repaired  . PREPATELLAR BURSA EXCISION Left 1993   and ostetomy. had at least 5 surgeries in 15 years  . SHOULDER SURGERY Right 2011   tendon was shredded and was trimmed, repaired and reattached. Screws in shoulder  . URETEROSCOPY WITH HOLMIUM LASER LITHOTRIPSY Bilateral 06/05/2016   Procedure: URETEROSCOPY WITH HOLMIUM LASER LITHOTRIPSY;  Surgeon: Nickie Retort, MD;  Location: [facility] ORS;  Service: Urology;  Laterality: Bilateral;  . VASECTOMY  2002    Home Medications:  Allergies as of 07/15/2017      Reactions   Ciprofloxacin Other (See Comments)   GI upset      Medication List  Accurate as of 07/15/17  9:31 AM. Always use your most recent med list.          aspirin 81 MG EC tablet Take 1 tablet (81 mg total) by mouth daily.   benzonatate 200 MG capsule Commonly known as:  TESSALON Take 200 mg by mouth 3 (three) times daily as needed for cough.   CICLOPIROX EX Apply 1 application topically at bedtime.   fenofibrate 145 MG tablet Commonly known as:  TRICOR Take 145 mg by mouth daily.   ferrous gluconate 324 MG  tablet Commonly known as:  FERGON TAKE 1 TABLET BY MOUTH THREE TIMES DAILY WITH MEALS   fluticasone 50 MCG/ACT nasal spray Commonly known as:  FLONASE Place 2 sprays into both nostrils daily as needed for allergies.   folic acid 1 MG tablet Commonly known as:  FOLVITE Take 1 mg by mouth daily.   glimepiride 4 MG tablet Commonly known as:  AMARYL Take 4 mg by mouth 2 (two) times daily.   HYDROcodone-acetaminophen 5-325 MG tablet Commonly known as:  NORCO/VICODIN Take 1 tablet by mouth every 6 (six) hours as needed for moderate pain.   JANUVIA 100 MG tablet Generic drug:  sitaGLIPtin Take 100 mg by mouth daily.   ketorolac 10 MG tablet Commonly known as:  TORADOL Take 10 mg by mouth every 6 (six) hours as needed for moderate pain. Reported on 05/22/2016   levocetirizine 5 MG tablet Commonly known as:  XYZAL Take 5 mg by mouth daily.   losartan 50 MG tablet Commonly known as:  COZAAR Take 50 mg by mouth daily.   MULTI-VITAMINS Tabs Take 1 tablet by mouth daily.   pantoprazole 40 MG tablet Commonly known as:  PROTONIX Take 40 mg by mouth 2 (two) times daily.   sildenafil 100 MG tablet Commonly known as:  VIAGRA Take 100 mg by mouth daily as needed for erectile dysfunction.   tiZANidine 2 MG tablet Commonly known as:  ZANAFLEX Take 2 mg by mouth at bedtime as needed for muscle spasms.   traMADol 50 MG tablet Commonly known as:  ULTRAM Take 50 mg by mouth every 6 (six) hours as needed for moderate pain. Reported on 05/22/2016   vitamin C 500 MG tablet Commonly known as:  ASCORBIC ACID Take 500 mg by mouth daily.            Discharge Care Instructions        Start     Ordered   07/15/17 0000  Urinalysis, Complete     07/15/17 0837   07/15/17 0000  CT HEMATURIA WORKUP    Question Answer Comment  Reason for Exam (SYMPTOM  OR DIAGNOSIS REQUIRED) gross hematuria, kidney stones   Preferred imaging location? [facility]   Radiology Contrast Protocol -  do NOT remove file path [email or web address]      07/15/17 0922   07/15/17 0000  PSA     07/15/17 9983      Allergies:  Allergies  Allergen Reactions  . Ciprofloxacin Other (See Comments)    GI upset    Family History: Family History  Problem Relation Age of Onset  . Urolithiasis Father   . Kidney disease Father   . Prostate cancer Neg Hx   . Kidney cancer Neg Hx     Social History:  reports that he has never smoked. He has never used smokeless tobacco. He reports that he does not drink alcohol or use drugs.  ROS: UROLOGY Frequent Urination?: No Hard to  postpone urination?: No Burning/pain with urination?: No Get up at night to urinate?: No Leakage of urine?: No Urine stream starts and stops?: Yes Trouble starting stream?: No Do you have to strain to urinate?: No Blood in urine?: Yes Urinary tract infection?: No Sexually transmitted disease?: No Injury to kidneys or bladder?: No Painful intercourse?: No Weak stream?: Yes Erection problems?: Yes Penile pain?: No  Gastrointestinal Nausea?: No Vomiting?: No Indigestion/heartburn?: No Diarrhea?: No Constipation?: No  Constitutional Fever: No Night sweats?: No Weight loss?: No Fatigue?: No  Skin Skin rash/lesions?: No Itching?: No  Eyes Blurred vision?: No Double vision?: No  Ears/Nose/Throat Sore throat?: No Sinus problems?: Yes  Hematologic/Lymphatic Swollen glands?: No Easy bruising?: No  Cardiovascular Leg swelling?: No Chest pain?: No  Respiratory Cough?: No Shortness of breath?: No  Endocrine Excessive thirst?: No  Musculoskeletal Back pain?: No Joint pain?: No  Neurological Headaches?: No Dizziness?: No  Psychologic Depression?: No Anxiety?: No  Physical Exam: BP 103/69 (BP Location: Left Arm, Patient Position: Sitting, Cuff Size: Normal)   Pulse (!) 108   Ht 5\' 9"  (1.753 m)   Wt 185 lb 6.4 oz (84.1 kg)   BMI 27.38 kg/m   Constitutional:   Alert and oriented, No acute distress. HEENT: [place] AT, moist mucus membranes.  Trachea midline, no masses. Cardiovascular: No clubbing, cyanosis, or edema. Respiratory: Normal respiratory effort, no increased work of breathing. GI: Abdomen is soft, nontender, nondistended, no abdominal masses GU: No CVA tenderness. DRE: 2+ benign. Skin: No rashes, bruises or suspicious lesions. Lymph: No cervical or inguinal adenopathy. Neurologic: Grossly intact, no focal deficits, moving all 4 extremities. Psychiatric: Normal mood and affect.  Laboratory Data: Lab Results  Component Value Date   WBC 6.7 04/09/2017   HGB 12.2 (L) 04/09/2017   HCT 35.4 (L) 04/09/2017   MCV 90.4 04/09/2017   PLT 272 04/09/2017    Lab Results  Component Value Date   CREATININE 1.14 04/09/2017    No results found for: PSA  No results found for: TESTOSTERONE  Lab Results  Component Value Date   HGBA1C 7.5 (H) 04/08/2017    Urinalysis    Component Value Date/Time   COLORURINE YELLOW (A) 10/08/2016 1625   APPEARANCEUR CLEAR (A) 10/08/2016 1625   APPEARANCEUR Clear 08/14/2016 0920   LABSPEC 1.011 10/08/2016 1625   PHURINE 5.0 10/08/2016 1625   GLUCOSEU 150 (A) 10/08/2016 1625   HGBUR NEGATIVE 10/08/2016 1625   BILIRUBINUR NEGATIVE 10/08/2016 1625   BILIRUBINUR Negative 08/14/2016 0920   KETONESUR NEGATIVE 10/08/2016 1625   PROTEINUR NEGATIVE 10/08/2016 1625   NITRITE NEGATIVE 10/08/2016 1625   LEUKOCYTESUR NEGATIVE 10/08/2016 1625   LEUKOCYTESUR Negative 08/14/2016 0920     Assessment & Plan:    1. Gross hematuria We'll arrange for CT urogram followed by office cystoscopy  2. History of nephrolithiasis Stone burden will be evaluated on above imaging.  3. BPH We'll address further after above  4. Prostate cancer screening Check PSA today   Return for after CT for cysto.  Nickie Retort, MD  [facility] [address] [place], [place]  [address] [phone]

## 2017-07-16 LAB — PSA: PROSTATE SPECIFIC AG, SERUM: 0.8 ng/mL (ref 0.0–4.0)

## 2017-07-20 ENCOUNTER — Telehealth: Payer: Self-pay

## 2017-07-20 NOTE — Telephone Encounter (Signed)
Left pt mess to call 

## 2017-07-20 NOTE — Telephone Encounter (Signed)
-----   Message from Nickie Retort, MD sent at 07/16/2017  6:15 PM EDT ----- Please let patient know PSA is normal. Follow up as scheduled. Thanks, BB   ----- Message ----- From: Lestine Box, LPN Sent: 06/13/2777  24:23 AM To: Nickie Retort, MD    ----- Message ----- From: Interface, [facility] Lab Results In Sent: 07/15/2017   1:37 PM To: Row[facility] Clinical

## 2017-08-13 ENCOUNTER — Other Ambulatory Visit: Payer: Managed Care, Other (non HMO)

## 2017-08-13 ENCOUNTER — Ambulatory Visit
Admission: RE | Admit: 2017-08-13 | Discharge: 2017-08-13 | Disposition: A | Discharge status: Home / Self Care | Payer: Managed Care, Other (non HMO) | Source: Ambulatory Visit | Attending: Urology | Admitting: Urology

## 2017-08-13 DIAGNOSIS — N200 Calculus of kidney: Secondary | ICD-10-CM

## 2017-08-13 DIAGNOSIS — N400 Enlarged prostate without lower urinary tract symptoms: Secondary | ICD-10-CM | POA: Insufficient documentation

## 2017-08-13 DIAGNOSIS — K760 Fatty (change of) liver, not elsewhere classified: Secondary | ICD-10-CM | POA: Insufficient documentation

## 2017-08-13 DIAGNOSIS — R310 Gross hematuria: Secondary | ICD-10-CM | POA: Insufficient documentation

## 2017-08-13 DIAGNOSIS — K5730 Diverticulosis of large intestine without perforation or abscess without bleeding: Secondary | ICD-10-CM | POA: Insufficient documentation

## 2017-08-13 MED ORDER — IOPAMIDOL (ISOVUE-300) INJECTION 61%
125.0000 mL | Freq: Once | INTRAVENOUS | Status: AC | PRN
  Administered 2017-08-13: 125 mL via INTRAVENOUS

## 2017-08-27 ENCOUNTER — Other Ambulatory Visit: Payer: Managed Care, Other (non HMO)

## 2017-09-03 ENCOUNTER — Other Ambulatory Visit: Payer: Managed Care, Other (non HMO)

## 2017-09-09 ENCOUNTER — Ambulatory Visit (INDEPENDENT_AMBULATORY_CARE_PROVIDER_SITE_OTHER): Payer: Managed Care, Other (non HMO) | Admitting: Urology

## 2017-09-09 VITALS — BP 133/82 | HR 100 | Ht 69.0 in | Wt 184.4 lb

## 2017-09-09 DIAGNOSIS — N200 Calculus of kidney: Secondary | ICD-10-CM

## 2017-09-09 DIAGNOSIS — R310 Gross hematuria: Secondary | ICD-10-CM

## 2017-09-09 LAB — URINALYSIS, COMPLETE
BILIRUBIN UA: NEGATIVE
KETONES UA: NEGATIVE
Leukocytes, UA: NEGATIVE
NITRITE UA: NEGATIVE
Protein, UA: NEGATIVE
RBC, UA: NEGATIVE
SPEC GRAV UA: 1.02 (ref 1.005–1.030)
UUROB: 0.2 mg/dL (ref 0.2–1.0)
pH, UA: 5.5 (ref 5.0–7.5)

## 2017-09-09 MED ORDER — LIDOCAINE HCL 2 % EX GEL
1.0000 "application " | Freq: Once | CUTANEOUS | Status: AC
  Administered 2017-09-09: 1 via URETHRAL

## 2017-09-09 MED ORDER — CIPROFLOXACIN HCL 500 MG PO TABS: 500.0000 mg | ORAL_TABLET | Freq: Once | ORAL | Status: DC

## 2017-09-09 NOTE — Progress Notes (Signed)
   09/09/17  CC:  Chief Complaint  Patient presents with  . Cysto    HPI: The patient is a 51 year old gentleman with a past medical history of nephrolithiasis presents today for follow-up.  1. History of nephrolithiasis Bilateral ureteroscopy in July 2017. His stones were 95% calcium oxalate monohydrate and 5% calcium phosphate.  He reports that he is seeing and passing stones at least once per month. He says they're the size of a beebee pellet. He does have pain at times and passing the stones. Very concerned by this. He was not interested in a 24 urine study at his last visit.  CT showed 10 mm x 3 mm stone/cluster of stones in right mid pole and bilateral punctate calculi.  2. Gross hematuria The patient notes intermittent gross hematuria even at times it is not passing a stone. CT Hematuria only positive for stones as above.  3. BPH The patient has had intermittent straining to urinate. He sometimes feels he doesn't empty his bladder. He is not currently on medication for this. He is more bothered by passing the stones and his hematuria currently.  4. Prostate cancer screening PSA 0.8 in September 2018.  Blood pressure 133/82, pulse 100, height 5\' 9"  (1.753 m), weight 184 lb 6.4 oz (83.6 kg). NED. A&Ox3.   No respiratory distress   Abd soft, NT, ND Normal phallus with bilateral descended testicles  Cystoscopy Procedure Note  Patient identification was confirmed, informed consent was obtained, and patient was prepped using Betadine solution.  Lidocaine jelly was administered per urethral meatus.    Preoperative abx where received prior to procedure.     Pre-Procedure: - Inspection reveals a normal caliber ureteral meatus.  Procedure: The flexible cystoscope was introduced without difficulty - No urethral strictures/lesions are present. - Enlarged prostate - mild visual obstruction - Normal bladder neck - Bilateral ureteral orifices identified - Bladder mucosa   reveals no ulcers, tumors, or lesions - No bladder stones - No trabeculation  Retroflexion shows no intravesical lobe   Post-Procedure: - Patient tolerated the procedure well  Assessment/ Plan:  1. Gross hematuria Negative work up except for nephrolithiasis  2. Nephrolithiasis Patient with right 10 mm x 3 mm midpole stone/cluster of stones and punctate bilateral stones. Patient does not want intervention for this at this time.  We will check a KUB in 6 months.  He is now amenable to undergoing a 24-hour urine study.  We will have him follow-up in 6 weeks for results.  3. Prostate cancer screening Up to date

## 2017-09-24 ENCOUNTER — Ambulatory Visit
Admission: RE | Admit: 2017-09-24 | Discharge: 2017-09-24 | Disposition: A | Discharge status: Home / Self Care | Payer: Managed Care, Other (non HMO) | Source: Ambulatory Visit | Attending: Unknown Physician Specialty | Admitting: Unknown Physician Specialty

## 2017-09-24 ENCOUNTER — Other Ambulatory Visit: Payer: Self-pay | Admitting: Unknown Physician Specialty

## 2017-09-24 DIAGNOSIS — J329 Chronic sinusitis, unspecified: Secondary | ICD-10-CM

## 2017-10-04 ENCOUNTER — Other Ambulatory Visit: Payer: Self-pay | Admitting: Urology

## 2017-10-22 ENCOUNTER — Telehealth: Payer: Self-pay | Admitting: Urology

## 2017-10-22 NOTE — Telephone Encounter (Signed)
Patient is calling because he states he was not told he needed to  Make a follow up app for his 24 hour urine results. They are back and due to his work schedule he is unable to come in to get these. Can we give them to him over the phone? Please advise He can be reached at [phone]  Thanks, Sharyn Lull

## 2017-10-26 NOTE — Telephone Encounter (Signed)
Left message for the patient to call back to schedule his results app  Sharyn Lull

## 2017-10-28 ENCOUNTER — Ambulatory Visit: Payer: Managed Care, Other (non HMO) | Admitting: Urology

## 2017-12-03 ENCOUNTER — Telehealth: Payer: Self-pay | Admitting: Urology

## 2017-12-03 NOTE — Telephone Encounter (Signed)
Pt came in about 24 hour urine results.  He said he spoke with you and it was 3 weeks ago.  He stormed out and said he was going to change offices.

## 2017-12-22 DIAGNOSIS — D509 Iron deficiency anemia, unspecified: Secondary | ICD-10-CM | POA: Insufficient documentation

## 2017-12-29 MED ORDER — CEFTRIAXONE SODIUM 1 G IJ SOLR
1.0000 g | Freq: Once | INTRAMUSCULAR | Status: AC
  Administered 2017-12-30: 1 g via INTRAVENOUS
  Filled 2017-12-29 (×3): qty 10

## 2017-12-30 ENCOUNTER — Encounter
Admission: RE | Disposition: A | Discharge status: Home / Self Care | Payer: Self-pay | Source: Ambulatory Visit | Attending: Urology

## 2017-12-30 ENCOUNTER — Other Ambulatory Visit: Payer: Self-pay

## 2017-12-30 ENCOUNTER — Encounter: Payer: Self-pay | Admitting: *Deleted

## 2017-12-30 ENCOUNTER — Ambulatory Visit
Admission: RE | Admit: 2017-12-30 | Discharge: 2017-12-30 | Disposition: A | Discharge status: Home / Self Care | Payer: Managed Care, Other (non HMO) | Source: Ambulatory Visit | Attending: Urology | Admitting: Urology

## 2017-12-30 DIAGNOSIS — G4730 Sleep apnea, unspecified: Secondary | ICD-10-CM | POA: Insufficient documentation

## 2017-12-30 DIAGNOSIS — N200 Calculus of kidney: Secondary | ICD-10-CM | POA: Insufficient documentation

## 2017-12-30 DIAGNOSIS — Z79899 Other long term (current) drug therapy: Secondary | ICD-10-CM | POA: Diagnosis not present

## 2017-12-30 DIAGNOSIS — E119 Type 2 diabetes mellitus without complications: Secondary | ICD-10-CM | POA: Insufficient documentation

## 2017-12-30 DIAGNOSIS — Z7951 Long term (current) use of inhaled steroids: Secondary | ICD-10-CM | POA: Insufficient documentation

## 2017-12-30 DIAGNOSIS — Z7984 Long term (current) use of oral hypoglycemic drugs: Secondary | ICD-10-CM | POA: Insufficient documentation

## 2017-12-30 DIAGNOSIS — I10 Essential (primary) hypertension: Secondary | ICD-10-CM | POA: Insufficient documentation

## 2017-12-30 HISTORY — PX: EXTRACORPOREAL SHOCK WAVE LITHOTRIPSY: SHX1557

## 2017-12-30 LAB — GLUCOSE, CAPILLARY: Glucose-Capillary: 95 mg/dL (ref 65–99)

## 2017-12-30 SURGERY — LITHOTRIPSY, ESWL
Anesthesia: Moderate Sedation | Laterality: Right

## 2017-12-30 MED ORDER — TAPENTADOL HCL 50 MG PO TABS: 50.0000 mg | ORAL_TABLET | Freq: Four times a day (QID) | ORAL | 0 refills | Status: DC | PRN

## 2017-12-30 MED ORDER — MIDAZOLAM HCL 2 MG/2ML IJ SOLN
1.0000 mg | Freq: Once | INTRAMUSCULAR | Status: AC
  Administered 2017-12-30: 1 mg via INTRAMUSCULAR

## 2017-12-30 MED ORDER — PROMETHAZINE HCL 25 MG/ML IJ SOLN
INTRAMUSCULAR | Status: AC
  Administered 2017-12-30: 25 mg via INTRAMUSCULAR
  Filled 2017-12-30: qty 1

## 2017-12-30 MED ORDER — FUROSEMIDE 10 MG/ML IJ SOLN
INTRAMUSCULAR | Status: AC
  Administered 2017-12-30: 10 mg via INTRAVENOUS
  Filled 2017-12-30: qty 2

## 2017-12-30 MED ORDER — DIPHENHYDRAMINE HCL 25 MG PO CAPS
ORAL_CAPSULE | ORAL | Status: AC
  Administered 2017-12-30: 25 mg via ORAL
  Filled 2017-12-30: qty 1

## 2017-12-30 MED ORDER — MORPHINE SULFATE (PF) 10 MG/ML IV SOLN
10.0000 mg | Freq: Once | INTRAVENOUS | Status: AC
  Administered 2017-12-30: 10 mg via INTRAMUSCULAR

## 2017-12-30 MED ORDER — ONDANSETRON 8 MG PO TBDP: 4.0000 mg | ORAL_TABLET | Freq: Four times a day (QID) | ORAL | 3 refills | Status: DC | PRN

## 2017-12-30 MED ORDER — DIPHENHYDRAMINE HCL 25 MG PO CAPS
25.0000 mg | ORAL_CAPSULE | ORAL | Status: AC
  Administered 2017-12-30: 25 mg via ORAL

## 2017-12-30 MED ORDER — PROMETHAZINE HCL 25 MG/ML IJ SOLN
25.0000 mg | Freq: Once | INTRAMUSCULAR | Status: AC
  Administered 2017-12-30: 25 mg via INTRAMUSCULAR

## 2017-12-30 MED ORDER — MIDAZOLAM HCL 2 MG/2ML IJ SOLN
INTRAMUSCULAR | Status: AC
  Administered 2017-12-30: 1 mg via INTRAMUSCULAR
  Filled 2017-12-30: qty 2

## 2017-12-30 MED ORDER — FUROSEMIDE 10 MG/ML IJ SOLN
10.0000 mg | Freq: Once | INTRAMUSCULAR | Status: AC
  Administered 2017-12-30: 10 mg via INTRAVENOUS

## 2017-12-30 MED ORDER — TAMSULOSIN HCL 0.4 MG PO CAPS: 0.4000 mg | ORAL_CAPSULE | Freq: Every day | ORAL | 1 refills | Status: DC

## 2017-12-30 MED ORDER — MORPHINE SULFATE (PF) 10 MG/ML IV SOLN
INTRAVENOUS | Status: AC
  Administered 2017-12-30: 10 mg via INTRAMUSCULAR
  Filled 2017-12-30: qty 1

## 2017-12-30 MED ORDER — DEXTROSE-NACL 5-0.45 % IV SOLN
INTRAVENOUS | Status: DC
  Administered 2017-12-30: 12:00:00 via INTRAVENOUS

## 2017-12-30 NOTE — Discharge Instructions (Signed)
Lithotripsy, Care After °This sheet gives you information about how to care for yourself after your procedure. Your health care provider may also give you more specific instructions. If you have problems or questions, contact your health care provider. °What can I expect after the procedure? °After the procedure, it is common to have: °· Some blood in your urine. This should only last for a few days. °· Soreness in your back, sides, or upper abdomen for a few days. °· Blotches or bruises on your back where the pressure wave entered the skin. °· Pain, discomfort, or nausea when pieces (fragments) of the kidney stone move through the tube that carries urine from the kidney to the bladder (ureter). Stone fragments may pass soon after the procedure, but they may continue to pass for up to 4-8 weeks. °? If you have severe pain or nausea, contact your health care provider. This may be caused by a large stone that was not broken up, and this may mean that you need more treatment. °· Some pain or discomfort during urination. °· Some pain or discomfort in the lower abdomen or (in men) at the base of the penis. ° °Follow these instructions at home: °Medicines °· Take over-the-counter and prescription medicines only as told by your health care provider. °· If you were prescribed an antibiotic medicine, take it as told by your health care provider. Do not stop taking the antibiotic even if you start to feel better. °· Do not drive for 24 hours if you were given a medicine to help you relax (sedative). °· Do not drive or use heavy machinery while taking prescription pain medicine. °Eating and drinking °· Drink enough water and fluids to keep your urine clear or pale yellow. This helps any remaining pieces of the stone to pass. It can also help prevent new stones from forming. °· Eat plenty of fresh fruits and vegetables. °· Follow instructions from your health care provider about eating and drinking restrictions. You may be  instructed: °? To reduce how much salt (sodium) you eat or drink. Check ingredients and nutrition facts on packaged foods and beverages. °? To reduce how much meat you eat. °· Eat the recommended amount of calcium for your age and gender. Ask your health care provider how much calcium you should have. °General instructions °· Get plenty of rest. °· Most people can resume normal activities 1-2 days after the procedure. Ask your health care provider what activities are safe for you. °· If directed, strain all urine through the strainer that was provided by your health care provider. °? Keep all fragments for your health care provider to see. Any stones that are found may be sent to a medical lab for examination. The stone may be as small as a grain of salt. °· Keep all follow-up visits as told by your health care provider. This is important. °Contact a health care provider if: °· You have pain that is severe or does not get better with medicine. °· You have nausea that is severe or does not go away. °· You have blood in your urine longer than your health care provider told you to expect. °· You have more blood in your urine. °· You have pain during urination that does not go away. °· You urinate more frequently than usual and this does not go away. °· You develop a rash or any other possible signs of an allergic reaction. °Get help right away if: °· You have severe pain in   your back, sides, or upper abdomen. °· You have severe pain while urinating. °· Your urine is very dark red. °· You have blood in your stool (feces). °· You cannot pass any urine at all. °· You feel a strong urge to urinate after emptying your bladder. °· You have a fever or chills. °· You develop shortness of breath, difficulty breathing, or chest pain. °· You have severe nausea that leads to persistent vomiting. °· You faint. °Summary °· After this procedure, it is common to have some pain, discomfort, or nausea when pieces (fragments) of the  kidney stone move through the tube that carries urine from the kidney to the bladder (ureter). If this pain or nausea is severe, however, you should contact your health care provider. °· Most people can resume normal activities 1-2 days after the procedure. Ask your health care provider what activities are safe for you. °· Drink enough water and fluids to keep your urine clear or pale yellow. This helps any remaining pieces of the stone to pass, and it can help prevent new stones from forming. °· If directed, strain your urine and keep all fragments for your health care provider to see. Fragments or stones may be as small as a grain of salt. °· Get help right away if you have severe pain in your back, sides, or upper abdomen or have severe pain while urinating. °This information is not intended to replace advice given to you by your health care provider. Make sure you discuss any questions you have with your health care provider. °Document Released: 11/15/2007 Document Revised: 09/16/2016 Document Reviewed: 09/16/2016 °Elsevier Interactive Patient Education © 2018 [employer] ° °

## 2017-12-31 ENCOUNTER — Encounter: Payer: Self-pay | Admitting: Urology

## 2018-03-25 ENCOUNTER — Encounter: Payer: Self-pay | Admitting: Internal Medicine

## 2018-03-25 ENCOUNTER — Ambulatory Visit (INDEPENDENT_AMBULATORY_CARE_PROVIDER_SITE_OTHER): Payer: Managed Care, Other (non HMO) | Admitting: Internal Medicine

## 2018-03-25 VITALS — BP 128/74 | HR 86 | Ht 70.0 in | Wt 184.8 lb

## 2018-03-25 DIAGNOSIS — Z8709 Personal history of other diseases of the respiratory system: Secondary | ICD-10-CM | POA: Diagnosis not present

## 2018-03-25 DIAGNOSIS — R0602 Shortness of breath: Secondary | ICD-10-CM

## 2018-03-25 DIAGNOSIS — R05 Cough: Secondary | ICD-10-CM | POA: Diagnosis not present

## 2018-03-25 DIAGNOSIS — Z87898 Personal history of other specified conditions: Secondary | ICD-10-CM

## 2018-03-25 DIAGNOSIS — R059 Cough, unspecified: Secondary | ICD-10-CM

## 2018-03-25 DIAGNOSIS — R0789 Other chest pain: Secondary | ICD-10-CM | POA: Diagnosis not present

## 2018-03-25 DIAGNOSIS — R062 Wheezing: Secondary | ICD-10-CM

## 2018-03-25 LAB — NITRIC OXIDE: NITRIC OXIDE: 31

## 2018-03-25 NOTE — Progress Notes (Signed)
Subjective:    Patient ID: Jesse Alvarado, male    DOB: 07-25-1966, 52 y.o.   MRN: [id number]  PCP Glendon Axe, MD   HPI   IOV 03/25/2018  Chief Complaint  Patient presents with  . Consult    Self referral for asthma and trouble breathing per pt.  Pt states he was diagnosed with sports induced asthma about 20-25 years ago and states it has become worse. Pt has c/o SOB, wheezing, coughing, and chest tightnness.     Jesse Alvarado [address] y.o. of [place] [address] has been referred by Dr. Donneta Romberg.  I do not have the outside records with me.  History is gained from talking to the patient and reviewing old records at [facility].  As best as I can gather Mr. Keisling tells me that he was diagnosed with exercise-induced asthma approximately 20-25 years ago when he is to be a lot more physically fit in the aftermath of a workout would have shortness of breath chest tightness and wheezing which would be relieved by albuterol partially.  Over the years since then he has managed himself with Advair for a few years intermittently and also possibly Serevent intermittently but for most of the time with albuterol as needed.  During these times he did have recurrent bronchitis maybe 1 or 2 episodes a year and at one time even a pneumonia that left himself with some scar tissue in the lung.  He was admitted for 4 days in the hospital several years ago for this pneumonia.  Most recent admissions in the last few years show ER visits and admissions due to recurrent UTI and nephrolithiasis.  Of late in the last several months perhaps even a few years he has been bothered by significant allergies while cutting grass and having postnasal drip and increased wheezing after the exposure.  Therefore he saw Dr. Donneta Romberg breathing for 2018.  Apparently the allergy test were not much significant.  Patient was started on Symbicort.  He does not recollect exam nitric oxide testing  but recollects spirometry that was abnormal.  However it appears that he did not respond in terms of his spirometry lung function to the Symbicort.  Therefore he has been referred here.  He continues to be maintained on Symbicort and his exam nitric oxide today is 31 intermediate while being on Symbicort.  He says he continues to have symptoms of shortness of breath and wheezing particularly after the exposure when he returns back into the home  Asthma control questionnaire shows that he is waking up a few times because of asthma symptoms and when he wakes up he has no symptoms.  He is very slightly limited in his activities because of asthma.  He experiences some amount of shortness of breath and wheezing hardly any of the time.  He uses albuterol for rescue   Data available to me is as below's   ................. [name] - 31 pbb on symbucort ..........................................  CT angio ches tpersonally visualized and agree with the report below IMPRESSION: No evidence of pulmonary emboli.  Mild bibasilar atelectatic changes   Electronically Signed   By: Inez Catalina M.D.   On: 04/09/2017 15:12 ...................Marland Kitchen Xray sinususs - report - 09/24/17 - normal ...........................Marland Kitchen Results for KIPLING, GRASER (MRN [id number]) as of 03/25/2018 10:04  Ref. Range 05/26/2016 08:21  Eosinophils Absolute Latest Ref Range: 0 - 0.7 K/uL 0.1    has a past medical history of  Anemia, Arthritis, Asthma, Cancer ([place]), Chronic kidney disease, Diabetes mellitus without complication ([place]), Diverticulosis, Erectile dysfunction, GERD (gastroesophageal reflux disease), Hyperlipidemia, Hypertension, Kidney stone, and Sleep apnea.   reports that he has never smoked. He has never used smokeless tobacco.  Past Surgical History:  Procedure Laterality Date  . CYSTOSCOPY WITH STENT PLACEMENT Bilateral 06/05/2016   Procedure: CYSTOSCOPY WITH STENT PLACEMENT;  Surgeon: Nickie Retort, MD;   Location: [facility] ORS;  Service: Urology;  Laterality: Bilateral;  . CYSTOSCOPY/URETEROSCOPY/HOLMIUM LASER/STENT PLACEMENT Left 04/13/2016   Procedure: CYSTOSCOPY/RETROGRADE PYELOGRAM/URETEROSCOPY WITH HOLMIUM LASER LITHOTRIPSY//STENT PLACEMENT;  Surgeon: Festus Aloe, MD;  Location: [facility] OR[facility];  Service: Urology;  Laterality: Left;  . ESOPHAGOGASTRODUODENOSCOPY (EGD) WITH PROPOFOL N/A 12/27/2015   Procedure: ESOPHAGOGASTRODUODENOSCOPY (EGD) WITH PROPOFOL;  Surgeon: Manya Silvas, MD;  Location: [facility] ENDOSCOPY;  Service: Endoscopy;  Laterality: N/A;  . EXTRACORPOREAL SHOCK WAVE LITHOTRIPSY Right 12/30/2017   Procedure: EXTRACORPOREAL SHOCK WAVE LITHOTRIPSY (ESWL);  Surgeon: Royston Cowper, MD;  Location: [facility] ORS;  Service: Urology;  Laterality: Right;  . HERNIA REPAIR  7035   umbilical  . KNEE ARTHROSCOPY Right 2015   had bursa sack repaired  . PREPATELLAR BURSA EXCISION Left 1993   and ostetomy. had at least 5 surgeries in 15 years  . SHOULDER SURGERY Right 2011   tendon was shredded and was trimmed, repaired and reattached. Screws in shoulder  . URETEROSCOPY WITH HOLMIUM LASER LITHOTRIPSY Bilateral 06/05/2016   Procedure: URETEROSCOPY WITH HOLMIUM LASER LITHOTRIPSY;  Surgeon: Nickie Retort, MD;  Location: [facility] ORS;  Service: Urology;  Laterality: Bilateral;  . VASECTOMY  2002    Allergies  Allergen Reactions  . Ciprofloxacin Other (See Comments)    GI upset    Immunization History  Administered Date(s) Administered  . Hep A / Hep B 09/10/2017, 10/22/2017  . Influenza Split 08/13/2015  . Influenza,inj,Quad PF,6+ Mos 08/09/2017  . Pneumococcal-Unspecified 11/10/2011  . Tdap 07/31/2016    Family History  Problem Relation Age of Onset  . Urolithiasis Father   . Kidney disease Father   . Prostate cancer Neg Hx   . Kidney cancer Neg Hx      Current Outpatient Medications:  .  aspirin EC 81 MG EC tablet, Take 1 tablet (81 mg total) by mouth daily., Disp: 30 tablet, Rfl:  0 .  Azelastine-Fluticasone (DYMISTA) 137-50 MCG/ACT SUSP, Place into the nose., Disp: , Rfl:  .  benzonatate (TESSALON) 200 MG capsule, Take 200 mg by mouth 3 (three) times daily as needed for cough., Disp: , Rfl:  .  calcium citrate-vitamin D (CITRACAL+D) 315-200 MG-UNIT tablet, Take by mouth., Disp: , Rfl:  .  CICLOPIROX EX, Apply 1 application topically at bedtime., Disp: , Rfl:  .  cyanocobalamin (,VITAMIN B-12,) 1000 MCG/ML injection, Inject into the muscle., Disp: , Rfl:  .  FARXIGA 5 MG TABS tablet, , Disp: , Rfl: 2 .  fenofibrate (TRICOR) 145 MG tablet, Take 145 mg by mouth daily. , Disp: , Rfl:  .  ferrous gluconate (FERGON) 324 MG tablet, TAKE 1 TABLET BY MOUTH THREE TIMES DAILY WITH MEALS, Disp: , Rfl:  .  folic acid (FOLVITE) 1 MG tablet, Take 1 mg by mouth daily., Disp: , Rfl:  .  glimepiride (AMARYL) 4 MG tablet, Take 4 mg by mouth 2 (two) times daily. , Disp: , Rfl:  .  ipratropium (ATROVENT) 0.06 % nasal spray, U 2 SPRAYS IEN TID, Disp: , Rfl: 3 .  levocetirizine (XYZAL) 5 MG tablet, Take 5 mg by  mouth daily., Disp: , Rfl: 11 .  losartan (COZAAR) 50 MG tablet, Take 50 mg by mouth daily. , Disp: , Rfl:  .  metFORMIN (GLUCOPHAGE) 850 MG tablet, Take 1 tablet by mouth 3 (three) times daily., Disp: , Rfl:  .  Multiple Vitamin (MULTI-VITAMINS) TABS, Take 1 tablet by mouth daily. , Disp: , Rfl:  .  pantoprazole (PROTONIX) 40 MG tablet, Take 40 mg by mouth 2 (two) times daily. , Disp: , Rfl:  .  PROAIR RESPICLICK 540 (90 Base) MCG/ACT AEPB, INL 2 PFS PO Q 6 H PRN, Disp: , Rfl: 0 .  STENDRA 200 MG TABS, U UTD, Disp: , Rfl: 6 .  sucralfate (CARAFATE) 1 g tablet, Take 1 g by mouth 2 (two) times daily., Disp: , Rfl:  .  SYMBICORT 160-4.5 MCG/ACT inhaler, INHALE 2 PUFFS PO BID, Disp: , Rfl: 3 .  tiZANidine (ZANAFLEX) 2 MG tablet, Take 2 mg by mouth at bedtime as needed for muscle spasms. , Disp: , Rfl:  .  traMADol (ULTRAM) 50 MG tablet, Take 50 mg by mouth every 6 (six) hours as needed  for moderate pain. Reported on 05/22/2016, Disp: , Rfl:  .  TRULICITY 0.86 PY/1.9JK SOPN, INJ 0.75 MG [place] Q 7 DAYS, Disp: , Rfl: 3 .  vitamin C (ASCORBIC ACID) 500 MG tablet, Take 500 mg by mouth daily., Disp: , Rfl:  .  sildenafil (VIAGRA) 100 MG tablet, Take 100 mg by mouth daily as needed for erectile dysfunction., Disp: , Rfl:      Review of Systems  Constitutional: Negative for fever and unexpected weight change.  HENT: Positive for postnasal drip. Negative for congestion, dental problem, ear pain, nosebleeds, rhinorrhea, sinus pressure, sneezing, sore throat and trouble swallowing.   Eyes: Negative for redness and itching.  Respiratory: Positive for cough, chest tightness, shortness of breath and wheezing.   Cardiovascular: Negative for palpitations and leg swelling.  Gastrointestinal: Negative for nausea and vomiting.  Genitourinary: Negative for dysuria.  Musculoskeletal: Positive for joint swelling.  Skin: Negative for rash.  Allergic/Immunologic: Negative.  Negative for environmental allergies, food allergies and immunocompromised state.  Neurological: Negative for headaches.  Hematological: Does not bruise/bleed easily.  Psychiatric/Behavioral: Negative for dysphoric mood. The patient is not nervous/anxious.        Objective:   Physical Exam  Constitutional: He is oriented to person, place, and time. He appears well-developed and well-nourished. No distress.  HENT:  Head: Normocephalic and atraumatic.  Right Ear: External ear normal.  Left Ear: External ear normal.  Mouth/Throat: Oropharynx is clear and moist. No oropharyngeal exudate.  Eyes: Pupils are equal, round, and reactive to light. Conjunctivae and EOM are normal. Right eye exhibits no discharge. Left eye exhibits no discharge. No scleral icterus.  Neck: Normal range of motion. Neck supple. No JVD present. No tracheal deviation present. No thyromegaly present.  Cardiovascular: Normal rate, regular rhythm and intact  distal pulses. Exam reveals no gallop and no friction rub.  No murmur heard. Pulmonary/Chest: Effort normal and breath sounds normal. No respiratory distress. He has no wheezes. He has no rales. He exhibits no tenderness.  Abdominal: Soft. Bowel sounds are normal. He exhibits no distension and no mass. There is no tenderness. There is no rebound and no guarding.  Musculoskeletal: Normal range of motion. He exhibits no edema or tenderness.  Lymphadenopathy:    He has no cervical adenopathy.  Neurological: He is alert and oriented to person, place, and time. He has normal reflexes. No  cranial nerve deficit. Coordination normal.  Skin: Skin is warm and dry. No rash noted. He is not diaphoretic. No erythema. No pallor.  Psychiatric: He has a normal mood and affect. His behavior is normal. Judgment and thought content normal.  Nursing note and vitals reviewed.   Vitals:   03/25/18 1000  BP: 128/74  Pulse: 86  SpO2: 97%    Estimated body mass index is 27.17 kg/m as calculated from the following:   Height as of 12/30/17: 5\' 9"  (1.753 m).   Weight as of 12/30/17: 184 lb (83.5 kg).        Assessment & Plan:     ICD-10-CM   1. History of asthma Z87.09 Pulmonary function test  2. Cough R05   3. History of wheezing Z87.898    I am not fully sure what is going on.  I would like to get a full formal pulmonary function test.  I would like to query the outside records.  Perhaps he has some amount of remodeled asthma with persistent obstruction.  He does not seem to have interstitial lung disease.  There might be an element of irritable larynx/cough neuropathy.  We will start all these out at the time of follow-up.   Dr. Brand Males, M.D., [facility].C.P Pulmonary and Critical Care Medicine Staff Physician, [place] Director - Interstitial Lung Disease  Program  Pulmonary [place] at [place], [place], [address]  Pager: [phone], If no answer or between  15:00h - 7:00h: call [phone] Telephone: [phone]

## 2018-03-25 NOTE — Patient Instructions (Addendum)
ICD-10-CM   1. History of asthma Z87.09   2. Cough R05   3. History of wheezing Z87.898    Do full PFT Sign release to get records from Dr Donneta Romberg  Followup Please return to followup next few weeks but after completing above

## 2018-04-08 ENCOUNTER — Ambulatory Visit (INDEPENDENT_AMBULATORY_CARE_PROVIDER_SITE_OTHER): Payer: Managed Care, Other (non HMO) | Admitting: Internal Medicine

## 2018-04-08 ENCOUNTER — Encounter: Payer: Self-pay | Admitting: Internal Medicine

## 2018-04-08 VITALS — BP 116/76 | HR 102 | Ht 70.8 in | Wt 182.4 lb

## 2018-04-08 DIAGNOSIS — R0609 Other forms of dyspnea: Secondary | ICD-10-CM | POA: Diagnosis not present

## 2018-04-08 DIAGNOSIS — R05 Cough: Secondary | ICD-10-CM

## 2018-04-08 DIAGNOSIS — R062 Wheezing: Secondary | ICD-10-CM

## 2018-04-08 DIAGNOSIS — Z8709 Personal history of other diseases of the respiratory system: Secondary | ICD-10-CM

## 2018-04-08 DIAGNOSIS — R053 Chronic cough: Secondary | ICD-10-CM

## 2018-04-08 LAB — PULMONARY FUNCTION TEST
DL/VA % PRED: 86 %
DL/VA: 3.96 ml/min/mmHg/L
DLCO unc % pred: 77 %
DLCO unc: 24.1 ml/min/mmHg
FEF 25-75 POST: 3.14 L/s
FEF 25-75 Pre: 3.09 L/sec
FEF2575-%CHANGE-POST: 1 %
FEF2575-%PRED-PRE: 96 %
FEF2575-%Pred-Post: 97 %
FEV1-%Change-Post: 0 %
FEV1-%Pred-Post: 90 %
FEV1-%Pred-Pre: 90 %
FEV1-PRE: 3.33 L
FEV1-Post: 3.32 L
FEV1FVC-%CHANGE-POST: 6 %
FEV1FVC-%Pred-Pre: 102 %
FEV6-%CHANGE-POST: -6 %
FEV6-%Pred-Post: 85 %
FEV6-%Pred-Pre: 91 %
FEV6-PRE: 4.21 L
FEV6-Post: 3.94 L
FEV6FVC-%PRED-PRE: 104 %
FEV6FVC-%Pred-Post: 104 %
FVC-%Change-Post: -6 %
FVC-%PRED-PRE: 87 %
FVC-%Pred-Post: 82 %
FVC-PRE: 4.21 L
FVC-Post: 3.94 L
POST FEV1/FVC RATIO: 84 %
PRE FEV1/FVC RATIO: 79 %
Post FEV6/FVC ratio: 100 %
Pre FEV6/FVC Ratio: 100 %
RV % pred: 120 %
RV: 2.46 L
TLC % pred: 98 %
TLC: 6.69 L

## 2018-04-08 NOTE — Progress Notes (Signed)
Subjective:     Patient ID: Jesse Alvarado, male   DOB: [birth year], 52 y.o.   MRN: [id number]  HPI  PCP Glendon Axe, MD   HPI   IOV 03/25/2018  Chief Complaint  Patient presents with  . Consult    Self referral for asthma and trouble breathing per pt.  Pt states he was diagnosed with sports induced asthma about 20-25 years ago and states it has become worse. Pt has c/o SOB, wheezing, coughing, and chest tightnness.     Jesse Alvarado [address] y.o. of [place] [address] has been referred by Dr. Donneta Romberg.  I do not have the outside records with me.  History is gained from talking to the patient and reviewing old records at [facility].  As best as I can gather Mr. Frieden tells me that he was diagnosed with exercise-induced asthma approximately 20-25 years ago when he is to be a lot more physically fit in the aftermath of a workout would have shortness of breath chest tightness and wheezing which would be relieved by albuterol partially.  Over the years since then he has managed himself with Advair for a few years intermittently and also possibly Serevent intermittently but for most of the time with albuterol as needed.  During these times he did have recurrent bronchitis maybe 1 or 2 episodes a year and at one time even a pneumonia that left himself with some scar tissue in the lung.  He was admitted for 4 days in the hospital several years ago for this pneumonia.  Most recent admissions in the last few years show ER visits and admissions due to recurrent UTI and nephrolithiasis.  Of late in the last several months perhaps even a few years he has been bothered by significant allergies while cutting grass and having postnasal drip and increased wheezing after the exposure.  Therefore he saw Dr. Donneta Romberg breathing for 2018.  Apparently the allergy test were not much significant.  Patient was started on Symbicort.  He does not recollect exam nitric oxide  testing but recollects spirometry that was abnormal.  However it appears that he did not respond in terms of his spirometry lung function to the Symbicort.  Therefore he has been referred here.  He continues to be maintained on Symbicort and his exam nitric oxide today is 31 intermediate while being on Symbicort.  He says he continues to have symptoms of shortness of breath and wheezing particularly after the exposure when he returns back into the home  Asthma control questionnaire shows that he is waking up a few times because of asthma symptoms and when he wakes up he has no symptoms.  He is very slightly limited in his activities because of asthma.  He experiences some amount of shortness of breath and wheezing hardly any of the time.  He uses albuterol for rescue   Data available to me is as below's   ................. [name] - 31 pbb on symbucort ..........................................  CT angio ches tpersonally visualized and agree with the report below IMPRESSION: No evidence of pulmonary emboli.  Mild bibasilar atelectatic changes   Electronically Signed   By: Inez Catalina M.D.   On: 04/09/2017 15:12 ...................Marland Kitchen Xray sinususs - report - 09/24/17 - normal ...........................Marland Kitchen Results for SHERRILL, MCKAMIE (MRN [id number]) as of 03/25/2018 10:04  Ref. Range 05/26/2016 08:21  Eosinophils Absolute Latest Ref Range: 0 - 0.7 K/uL 0.1    OV 04/08/2018  Chief Complaint  Patient presents with  . Follow-up    Pt has productive cough-foam white, SOB with exertion, some chest tightness more with yard work. Pt is more fatigued and SOB with exertion has increased in last 4 weeks.   Christa See follows up for his out of proportion cough and wheezing.  At this visit he had pulmonary function testing.  In the interim he tells me his symptoms persist.  In addition he states that he has constant clearing of the throat.  He also says that he has exertional shortness of  breath with wheezing when he climbs up stairs and walk down.  When we walked him 185 feet x 3 laps on room air in the office he was observed to have mild shortness of breath by the CMA but he himself denied it.  The pulmonary function test today is shows normal spirometry.  His total lung capacity is slightly below normal at 74% and his DLCO slightly below normal at 77%.  All essentially close to normal.  His flow volume loop looked normal.  Review of the outside chart indicates most recent visit with Dr. Donneta Romberg was February 11, 2018.  Diagnosis was vasomotor rhinitis and mild persistent asthma.  It was noted that he was seen ENT Dr. Tami Ribas.  He was having worsening symptoms such as cough and wheezing working in the yard and therefore this referral was made.  On a prior visit it was noted by Dr. Donneta Romberg that his allergen test was negative on epicutaneous and intradermal levels.  Flu test was also negative.  He did have a spirometry with Dr. Donneta Romberg on July 30, 2017 that was abnormal with a FEV1 of 2.66 L 68% and a ratio of 78%. Similar results April 2019 sprirometrty    Simple office walk 185 feet x  3 laps goal with forehead probe 04/08/2018   O2 used Room air  Number laps completed 3  Comments about pace Normal pace  Resting Pulse Ox/HR 99% and 102/min  Final Pulse Ox/HR 99% and 112/min  Desaturated </= 88% no  Desaturated <= 3% points no  Got Tachycardic >/= 90/min yes  Symptoms at end of test Mild dyspnea  Miscellaneous comments x   Results for KEIGAN, TAFOYA (MRN [id number]) as of 04/08/2018 12:19  Ref. Range 04/08/2018 10:43  FVC-Post Latest Units: L 3.94  FVC-%Pred-Post Latest Units: % 82  FVC-%Change-Post Latest Units: % -6  FEV1-Post Latest Units: L 3.32  FEV1-%Pred-Post Latest Units: % 90  FEV1-%Change-Post Latest Units: % 0  Post FEV1/FVC ratio Latest Units: % 84  Results for DARDAN, SHELTON (MRN [id number]) as of 04/08/2018 12:19  Ref. Range 04/08/2018 10:43  TLC Latest Units:  L 6.69  TLC % pred Latest Units: % 98  Results for LYON, DUMONT (MRN [id number]) as of 04/08/2018 12:19  Ref. Range 04/08/2018 10:43  DLCO unc Latest Units: ml/min/mmHg 24.10  DLCO unc % pred Latest Units: % 77     has a past medical history of Anemia, Arthritis, Asthma, Cancer ([place]), Chronic kidney disease, Diabetes mellitus without complication ([place]), Diverticulosis, Erectile dysfunction, GERD (gastroesophageal reflux disease), Hyperlipidemia, Hypertension, Kidney stone, and Sleep apnea.   reports that he has never smoked. He has never used smokeless tobacco.  Past Surgical History:  Procedure Laterality Date  . CYSTOSCOPY WITH STENT PLACEMENT Bilateral 06/05/2016   Procedure: CYSTOSCOPY WITH STENT PLACEMENT;  Surgeon: Nickie Retort, MD;  Location: [facility] ORS;  Service: Urology;  Laterality: Bilateral;  . CYSTOSCOPY/URETEROSCOPY/HOLMIUM LASER/STENT PLACEMENT Left  04/13/2016   Procedure: CYSTOSCOPY/RETROGRADE PYELOGRAM/URETEROSCOPY WITH HOLMIUM LASER LITHOTRIPSY//STENT PLACEMENT;  Surgeon: Festus Aloe, MD;  Location: [facility] OR[facility];  Service: Urology;  Laterality: Left;  . ESOPHAGOGASTRODUODENOSCOPY (EGD) WITH PROPOFOL N/A 12/27/2015   Procedure: ESOPHAGOGASTRODUODENOSCOPY (EGD) WITH PROPOFOL;  Surgeon: Manya Silvas, MD;  Location: [facility] ENDOSCOPY;  Service: Endoscopy;  Laterality: N/A;  . EXTRACORPOREAL SHOCK WAVE LITHOTRIPSY Right 12/30/2017   Procedure: EXTRACORPOREAL SHOCK WAVE LITHOTRIPSY (ESWL);  Surgeon: Royston Cowper, MD;  Location: [facility] ORS;  Service: Urology;  Laterality: Right;  . HERNIA REPAIR  1638   umbilical  . KNEE ARTHROSCOPY Right 2015   had bursa sack repaired  . PREPATELLAR BURSA EXCISION Left 1993   and ostetomy. had at least 5 surgeries in 15 years  . SHOULDER SURGERY Right 2011   tendon was shredded and was trimmed, repaired and reattached. Screws in shoulder  . URETEROSCOPY WITH HOLMIUM LASER LITHOTRIPSY Bilateral 06/05/2016   Procedure: URETEROSCOPY WITH  HOLMIUM LASER LITHOTRIPSY;  Surgeon: Nickie Retort, MD;  Location: [facility] ORS;  Service: Urology;  Laterality: Bilateral;  . VASECTOMY  2002    Allergies  Allergen Reactions  . Ciprofloxacin Other (See Comments)    GI upset    Immunization History  Administered Date(s) Administered  . Hep A / Hep B 09/10/2017, 10/22/2017  . Influenza Split 08/13/2015  . Influenza,inj,Quad PF,6+ Mos 08/09/2017  . Pneumococcal-Unspecified 11/10/2011  . Tdap 07/31/2016    Family History  Problem Relation Age of Onset  . Urolithiasis Father   . Kidney disease Father   . Prostate cancer Neg Hx   . Kidney cancer Neg Hx      Current Outpatient Medications:  .  aspirin EC 81 MG EC tablet, Take 1 tablet (81 mg total) by mouth daily., Disp: 30 tablet, Rfl: 0 .  Azelastine-Fluticasone (DYMISTA) 137-50 MCG/ACT SUSP, Place into the nose., Disp: , Rfl:  .  calcium citrate-vitamin D (CITRACAL+D) 315-200 MG-UNIT tablet, Take by mouth., Disp: , Rfl:  .  CICLOPIROX EX, Apply 1 application topically at bedtime., Disp: , Rfl:  .  cyanocobalamin (,VITAMIN B-12,) 1000 MCG/ML injection, Inject into the muscle., Disp: , Rfl:  .  FARXIGA 5 MG TABS tablet, , Disp: , Rfl: 2 .  fenofibrate (TRICOR) 145 MG tablet, Take 145 mg by mouth daily. , Disp: , Rfl:  .  ferrous gluconate (FERGON) 324 MG tablet, TAKE 1 TABLET BY MOUTH THREE TIMES DAILY WITH MEALS, Disp: , Rfl:  .  folic acid (FOLVITE) 1 MG tablet, Take 1 mg by mouth daily., Disp: , Rfl:  .  glimepiride (AMARYL) 4 MG tablet, Take 2 mg by mouth 2 (two) times daily. Patient takes 2mg  morning, 2mg  afternoon, and 4 mg at bedtime daily. Totals 8mg  daily., Disp: , Rfl:  .  ipratropium (ATROVENT) 0.06 % nasal spray, U 2 SPRAYS IEN TID, Disp: , Rfl: 3 .  levocetirizine (XYZAL) 5 MG tablet, Take 5 mg by mouth daily., Disp: , Rfl: 11 .  losartan (COZAAR) 50 MG tablet, Take 50 mg by mouth daily. , Disp: , Rfl:  .  metFORMIN (GLUCOPHAGE) 850 MG tablet, Take 1 tablet by  mouth 3 (three) times daily., Disp: , Rfl:  .  Multiple Vitamin (MULTI-VITAMINS) TABS, Take 1 tablet by mouth daily. , Disp: , Rfl:  .  pantoprazole (PROTONIX) 40 MG tablet, Take 40 mg by mouth 2 (two) times daily. , Disp: , Rfl:  .  PROAIR RESPICLICK 466 (90 Base) MCG/ACT AEPB, INL 2 PFS PO Q  6 H PRN, Disp: , Rfl: 0 .  sildenafil (VIAGRA) 100 MG tablet, Take 100 mg by mouth daily as needed for erectile dysfunction., Disp: , Rfl:  .  STENDRA 200 MG TABS, U UTD, Disp: , Rfl: 6 .  sucralfate (CARAFATE) 1 g tablet, Take 1 g by mouth 2 (two) times daily., Disp: , Rfl:  .  SYMBICORT 160-4.5 MCG/ACT inhaler, INHALE 2 PUFFS PO BID, Disp: , Rfl: 3 .  tiZANidine (ZANAFLEX) 2 MG tablet, Take 2 mg by mouth at bedtime as needed for muscle spasms. , Disp: , Rfl:  .  traMADol (ULTRAM) 50 MG tablet, Take 50 mg by mouth every 6 (six) hours as needed for moderate pain. Reported on 05/22/2016, Disp: , Rfl:  .  TRULICITY 6.21 HY/8.6VH SOPN, INJ 0.75 MG [place] Q 7 DAYS, Disp: , Rfl: 3 .  vitamin C (ASCORBIC ACID) 500 MG tablet, Take 500 mg by mouth daily., Disp: , Rfl:  .  benzonatate (TESSALON) 200 MG capsule, Take 200 mg by mouth 3 (three) times daily as needed for cough., Disp: , Rfl:    Review of Systems     Objective:   Physical Exam  Constitutional: He is oriented to person, place, and time. He appears well-developed and well-nourished. No distress.  HENT:  Head: Normocephalic and atraumatic.  Right Ear: External ear normal.  Left Ear: External ear normal.  Mouth/Throat: Oropharynx is clear and moist. No oropharyngeal exudate.  Clears throat  Eyes: Pupils are equal, round, and reactive to light. Conjunctivae and EOM are normal. Right eye exhibits no discharge. Left eye exhibits no discharge. No scleral icterus.  Neck: Normal range of motion. Neck supple. No JVD present. No tracheal deviation present. No thyromegaly present.  Cardiovascular: Normal rate, regular rhythm and intact distal pulses. Exam reveals  no gallop and no friction rub.  No murmur heard. Pulmonary/Chest: Effort normal and breath sounds normal. No respiratory distress. He has no wheezes. He has no rales. He exhibits no tenderness.  Abdominal: Soft. Bowel sounds are normal. He exhibits no distension and no mass. There is no tenderness. There is no rebound and no guarding.  Musculoskeletal: Normal range of motion. He exhibits no edema or tenderness.  Lymphadenopathy:    He has no cervical adenopathy.  Neurological: He is alert and oriented to person, place, and time. He has normal reflexes. No cranial nerve deficit. Coordination normal.  Skin: Skin is warm and dry. No rash noted. He is not diaphoretic. No erythema. No pallor.  Psychiatric: He has a normal mood and affect. His behavior is normal. Judgment and thought content normal.  Nursing note and vitals reviewed.  Today's Vitals   04/08/18 1146  BP: 116/76  Pulse: (!) 102  SpO2: 98%  Weight: 182 lb 6.4 oz (82.7 kg)  Height: 5' 10.8" (1.798 m)       Assessment:       ICD-10-CM   1. Dyspnea on exertion R06.09 Cardiopulmonary exercise test    CANCELED: Cardiopulmonary exercise test  2. Chronic cough R05 Cardiopulmonary exercise test  3. 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  Brand Males, M.D., F.C.C.P Pulmonary and Critical Care Medicine Staff Physician, [place] Director - Interstitial Lung Disease  Program  Pulmonary [place] at [place], [place], [address]  Pager: [phone], If no answer or between  15:00h - 7:00h: call [phone] Telephone: [phone]

## 2018-04-08 NOTE — Patient Instructions (Addendum)
ICD-10-CM   1. Dyspnea on exertion R06.09   2. Chronic cough R05   3. 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

## 2018-04-08 NOTE — Progress Notes (Addendum)
Patient completed full PFT today. Patient was unable to complete the pleth portion of the test, had good effort and tried several times. Patient completed Nitrogen Washout instead of the Pleth.

## 2018-04-22 ENCOUNTER — Ambulatory Visit (HOSPITAL_COMMUNITY): Payer: Managed Care, Other (non HMO) | Attending: Internal Medicine

## 2018-04-22 ENCOUNTER — Encounter (HOSPITAL_COMMUNITY): Payer: Self-pay | Admitting: *Deleted

## 2018-04-22 DIAGNOSIS — R0609 Other forms of dyspnea: Secondary | ICD-10-CM | POA: Insufficient documentation

## 2018-04-22 DIAGNOSIS — R05 Cough: Secondary | ICD-10-CM | POA: Insufficient documentation

## 2018-04-22 DIAGNOSIS — R053 Chronic cough: Secondary | ICD-10-CM

## 2018-04-22 DIAGNOSIS — R062 Wheezing: Secondary | ICD-10-CM | POA: Insufficient documentation

## 2018-05-01 ENCOUNTER — Telehealth: Payer: Self-pay | Admitting: Internal Medicine

## 2018-05-01 NOTE — Telephone Encounter (Signed)
CPST suggests he is deconditioned aka out of shape  PlAn - make appt to meet iwht app = and get referred to rehab for dyspnea - also discuss cough Rx - gabapentin and voice rehab with app   Dr. Brand Males, M.D., [facility].C.P Pulmonary and Critical Care Medicine Staff Physician, [place] Director - Interstitial Lung Disease  Program  Pulmonary [place] at [place], [place], [address]  Pager: [phone], If no answer or between  15:00h - 7:00h: call [phone] Telephone: [phone]

## 2018-05-03 NOTE — Telephone Encounter (Signed)
Spoke with patient. He has been scheduled with Wyn Quaker for 05/04/18 at 1130. Nothing else needed at time of call.

## 2018-05-03 NOTE — Telephone Encounter (Signed)
Attempted to call pt but unable to reach him.  Left message for pt to return call x1 

## 2018-05-03 NOTE — Telephone Encounter (Signed)
Pt returning call. Cb is [phone]

## 2018-05-04 ENCOUNTER — Ambulatory Visit (INDEPENDENT_AMBULATORY_CARE_PROVIDER_SITE_OTHER)
Admission: RE | Admit: 2018-05-04 | Discharge: 2018-05-04 | Disposition: A | Discharge status: Home / Self Care | Payer: Managed Care, Other (non HMO) | Source: Ambulatory Visit | Attending: Pulmonary Disease | Admitting: Pulmonary Disease

## 2018-05-04 ENCOUNTER — Encounter: Payer: Self-pay | Admitting: Pulmonary Disease

## 2018-05-04 ENCOUNTER — Ambulatory Visit (INDEPENDENT_AMBULATORY_CARE_PROVIDER_SITE_OTHER): Payer: Managed Care, Other (non HMO) | Admitting: Pulmonary Disease

## 2018-05-04 VITALS — BP 124/88 | HR 91 | Ht 69.0 in | Wt 179.2 lb

## 2018-05-04 DIAGNOSIS — J3089 Other allergic rhinitis: Secondary | ICD-10-CM

## 2018-05-04 DIAGNOSIS — Z9989 Dependence on other enabling machines and devices: Secondary | ICD-10-CM

## 2018-05-04 DIAGNOSIS — R05 Cough: Secondary | ICD-10-CM | POA: Diagnosis not present

## 2018-05-04 DIAGNOSIS — R059 Cough, unspecified: Secondary | ICD-10-CM

## 2018-05-04 DIAGNOSIS — G4733 Obstructive sleep apnea (adult) (pediatric): Secondary | ICD-10-CM

## 2018-05-04 MED ORDER — BENZONATATE 200 MG PO CAPS: 200.0000 mg | ORAL_CAPSULE | Freq: Three times a day (TID) | ORAL | 4 refills | Status: DC | PRN

## 2018-05-04 MED ORDER — GABAPENTIN 300 MG PO CAPS: 300.0000 mg | ORAL_CAPSULE | Freq: Every day | ORAL | 3 refills | Status: DC

## 2018-05-04 NOTE — Assessment & Plan Note (Signed)
Continue CPAP use  Notify us if you have breaks in therapy or issues using the machine

## 2018-05-04 NOTE — Assessment & Plan Note (Signed)
Will start gabapentin 300 mg daily to help with cough >>>Take this is not at night as it can cause her to be sleepy >>>Contact our office in about 1-2 weeks and let us know how you are doing on this dosage   Continue to take Tessalon as needed for cough, continue your Protonix, continue your Xyzal, can also use Chlortab's over-the-counter to help if you are having postnasal drip.  Try diligently to avoid triggers best you can triggers identified at this office appointment are: Stress, working outside, hard work.  Chest Xray today   . We believe you have a chronic/cyclical cough that is aggravated by reflux , coughing , and drainage.  . Goal is to not Cough or clear throat.  Marland Kitchen Avoid coughing or clearing throat by using:  o non-mint products/sugarless candy o Water o ice chips o Remember NO MINT PRODUCTS  . Medications to use:  o Mucinex DM 1-2 every 12 hrs or Delsym 2 tsp every 12 hrs f or cough o Tessalon Three times a day  As needed  Cough.  o Tramadol 50 mg every 4hrs as needed for breakthrough coughing until 100% cough free.  o Protonix 40mg  every 12 hours   o Xyzal 5mg  at bedtime o Chlor tabs 4mg  2 at bedtime  for nasal drip until cough is 100% cough free.    Follow up with our office in 4-6 weeks with Dr. Purnell Shoemaker.

## 2018-05-04 NOTE — Patient Instructions (Addendum)
Will start gabapentin 300 mg daily to help with cough >>>Take this is not at night as it can cause her to be sleepy >>>Contact our office in about 1-2 weeks and let us know how you are doing on this dosage   Continue to take Tessalon as needed for cough, continue your Protonix, continue your Xyzal, can also use Chlortab's over-the-counter to help if you are having postnasal drip.  Try diligently to avoid triggers best you can triggers identified at this office appointment are: Stress, working outside, hard work.  Chest Xray today   . We believe you have a chronic/cyclical cough that is aggravated by reflux , coughing , and drainage.  . Goal is to not Cough or clear throat.  Marland Kitchen Avoid coughing or clearing throat by using:  o non-mint products/sugarless candy o Water o ice chips o Remember NO MINT PRODUCTS  . Medications to use:  o Mucinex DM 1-2 every 12 hrs or Delsym 2 tsp every 12 hrs f or cough o Tessalon Three times a day  As needed  Cough.  o Tramadol 50 mg every 4hrs as needed for breakthrough coughing until 100% cough free.  o Protonix 40mg  every 12 hours   o Xyzal 5mg  at bedtime o Chlor tabs 4mg  2 at bedtime  for nasal drip until cough is 100% cough free.    Follow up with our office in 4-6 weeks with Dr. Purnell Shoemaker.   Please contact the office if your symptoms worsen or you have concerns that you are not improving.   Thank you for choosing [facility] for your healthcare, and for allowing [place] to partner with you on your healthcare journey. I am thankful to be able to provide care to you today.   Wyn Quaker FNP-C

## 2018-05-04 NOTE — Progress Notes (Signed)
@Patient  ID: Jesse Alvarado, male    DOB: 1965/12/02, 52 y.o.   MRN: [id number]  Chief Complaint  Patient presents with  . Follow-up    Stress Test results    Referring provider: Glendon Axe, MD  HPI: 52 year old man seen in our office initially on 03/25/2018 for increased dyspnea, and exercise induced asthma.  Being worked up by Dr. Chase Alvarado.  Recent [name] Encounters:   03/25/2018-initial office visit-Jesse Alvarado Patient reporting he was diagnosed with exercise-induced asthma about 20 to 25 years ago, patient also reporting being bothered by significant allergies, saw Dr. Donneta Alvarado in 2018 and apparently allergy tests were not significant, presenting on Symbicort, Lanice Shirts today is 52 Plan pulmonary function test, could be irritable larynx/cough neuropathy, follow-up in 2 weeks  04/08/2018-office visit-Jesse Alvarado patient reports he has constant clearing of the throat, symptoms of shortness of breath and exertional shortness of breath persists, occasional wheezing when climbing up and down stairs.  Was walked in office today at 185 feet x 3 laps on room air CMA performing the test said that he had shortness of breath, patient denied it.  Pulmonary function test completed today shows normal spirometry.  Total lung capacity slightly below normal at 74% and his DLCO is slightly below normal at 77% all close to normal flow volume loop looked normal. Plan suspicion cough neuropathy, will do cardiopulmonary test, and if nothing develops then can do gabapentin for cough  Tests:  04/08/2018-pulmonary function test ratio 79, 84, FEV1 90, no bronchodilator response, no abnormal concavity on flow volume loops 03/25/18 - [name] - 31   Imaging:  09/24/2017- sinuses-no evidence of sinusitis 04/09/2017-CT Angio- no evidence of pulmonary emboli  Cardiac:  04/22/2018-cardiopulmonary exercise test- no indication for exercise-induced bronchospasm, does not demonstrate any evidence for cardiopulmonary  limitation, most likely patient is deconditioned  Labs:   Micro:   Chart Review:    05/04/18  OV  Pleasant patient seen in office today for follow-up to discuss cardiopulmonary stress test as well as to continue to discuss chronic cough.  Patient is accompanied with wife today.  Patient reporting that cough is still present, occasionally productive.  Patient reports known triggers of stress, working outside/doing yard work, and also waking up.  Patient reports that 3 weeks ago he did have one episode after a large coughing fit where he had blood streaks in his sputum.  Patient and wife both confirm that when patient is coughing vigorously he is able to bring up sputum.  Patient is adherent to his Protonix, Xyzal, Symbicort.  Unfortunately patient has not been using Tessalon.  Patient is also not been using his Ultram as he says that it was affecting his sleep.  Allergies  Allergen Reactions  . Ciprofloxacin Other (See Comments)    GI upset    Immunization History  Administered Date(s) Administered  . Hep A / Hep B 09/10/2017, 10/22/2017  . Influenza Split 08/13/2015  . Influenza,inj,Quad PF,6+ Mos 08/09/2017  . Pneumococcal-Unspecified 11/10/2011  . Tdap 07/31/2016    Past Medical History:  Diagnosis Date  . Anemia    taking 3 iron pills a day  . Arthritis   . Asthma    sports induced asthma. takes inhalers when needed  . Cancer (HCC)    Basal cell  . Chronic kidney disease    kidney stones  . Diabetes mellitus without complication (Jesse Alvarado)   . Diverticulosis   . Erectile dysfunction   . GERD (gastroesophageal reflux disease)   . Hyperlipidemia   .  Hypertension    per patient, he does not have high bp but is treated for his kidneys and his diabetes  . Kidney stone   . Sleep apnea    use C-PAP    Tobacco History: Social History   Tobacco Use  Smoking Status Never Smoker  Smokeless Tobacco Never Used   Counseling given: Yes Continue not smoking.   Outpatient  Encounter Medications as of 05/04/2018  Medication Sig  . aspirin EC 81 MG EC tablet Take 1 tablet (81 mg total) by mouth daily.  . Azelastine-Fluticasone (DYMISTA) 137-50 MCG/ACT SUSP Place into the nose.  . benzonatate (TESSALON) 200 MG capsule Take 1 capsule (200 mg total) by mouth 3 (three) times daily as needed for cough.  . calcium citrate-vitamin D (CITRACAL+D) 315-200 MG-UNIT tablet Take by mouth.  . CICLOPIROX EX Apply 1 application topically at bedtime.  . cyanocobalamin (,VITAMIN B-12,) 1000 MCG/ML injection Inject into the muscle.  Marland Kitchen FARXIGA 5 MG TABS tablet   . fenofibrate (TRICOR) 145 MG tablet Take 145 mg by mouth daily.   . ferrous gluconate (FERGON) 324 MG tablet TAKE 1 TABLET BY MOUTH THREE TIMES DAILY WITH MEALS  . folic acid (FOLVITE) 1 MG tablet Take 1 mg by mouth daily.  Marland Kitchen glimepiride (AMARYL) 4 MG tablet Take 2 mg by mouth 2 (two) times daily. Patient takes 2mg  morning, 2mg  afternoon, and 4 mg at bedtime daily. Totals 8mg  daily.  Marland Kitchen ipratropium (ATROVENT) 0.06 % nasal spray U 2 SPRAYS IEN TID  . levocetirizine (XYZAL) 5 MG tablet Take 5 mg by mouth daily.  Marland Kitchen losartan (COZAAR) 50 MG tablet Take 50 mg by mouth daily.   . metFORMIN (GLUCOPHAGE) 850 MG tablet Take 1 tablet by mouth 3 (three) times daily.  . Multiple Vitamin (MULTI-VITAMINS) TABS Take 1 tablet by mouth daily.   . pantoprazole (PROTONIX) 40 MG tablet Take 40 mg by mouth 2 (two) times daily.   Marland Kitchen PROAIR RESPICLICK 443 (90 Base) MCG/ACT AEPB INL 2 PFS PO Q 6 H PRN  . sildenafil (VIAGRA) 100 MG tablet Take 100 mg by mouth daily as needed for erectile dysfunction.  Marland Kitchen STENDRA 200 MG TABS U UTD  . sucralfate (CARAFATE) 1 g tablet Take 1 g by mouth 2 (two) times daily.  . SYMBICORT 160-4.5 MCG/ACT inhaler INHALE 2 PUFFS PO BID  . tiZANidine (ZANAFLEX) 2 MG tablet Take 2 mg by mouth at bedtime as needed for muscle spasms.   . traMADol (ULTRAM) 50 MG tablet Take 50 mg by mouth every 6 (six) hours as needed for moderate  pain. Reported on 05/22/2016  . TRULICITY 1.54 MG/8.6PY SOPN INJ 0.75 MG [place] Q 7 DAYS  . vitamin C (ASCORBIC ACID) 500 MG tablet Take 500 mg by mouth daily.  . [DISCONTINUED] benzonatate (TESSALON) 200 MG capsule Take 200 mg by mouth 3 (three) times daily as needed for cough.  . gabapentin (NEURONTIN) 300 MG capsule Take 1 capsule (300 mg total) by mouth at bedtime.   No facility-administered encounter medications on file as of 05/04/2018.      Review of Systems  Constitutional:   No  weight loss, night sweats,  fevers, chills, fatigue, or  lassitude HEENT:   No headaches,  Difficulty swallowing,  Tooth/dental problems, or  Sore throat, No sneezing, itching, ear ache, nasal congestion, post nasal drip  CV: No chest pain,  orthopnea, PND, swelling in lower extremities, anasarca, dizziness, palpitations, syncope  GI: No heartburn, indigestion, abdominal pain, nausea, vomiting, diarrhea,  change in bowel habits, loss of appetite, bloody stools Resp: +cough, occasionally productive with thick white sputum  No shortness of breath with exertion or at rest. No wheezing.  No chest wall deformity Skin: no rash, lesions, no skin changes. GU: no dysuria, change in color of urine, no urgency or frequency.  No flank pain, no hematuria  MS:  No joint pain or swelling.  No decreased range of motion.  No back pain. Psych:  No change in mood or affect. No depression or anxiety.  No memory loss.   Physical Exam  BP 124/88   Pulse 91   Ht 5\' 9"  (1.753 m)   Wt 179 lb 3.2 oz (81.3 kg)   SpO2 97%   BMI 26.46 kg/m    Wt Readings from Last 3 Encounters:  05/04/18 179 lb 3.2 oz (81.3 kg)  04/08/18 182 lb 6.4 oz (82.7 kg)  03/25/18 184 lb 12.8 oz (83.8 kg)     GEN: A/Ox3; pleasant , NAD, well nourished    HEENT:  [place]/AT,  EACs-clear, TMs-wnl, NOSE-clear, THROAT- +post nasal drip, Sinus - non tender to palpation   NECK:  Supple w/ fair ROM; no JVD;  no lymphadenopathy.    RESP:  Clear  P & A; w/o,  wheezes/ rales/ or rhonchi. no accessory muscle use, no dullness to percussion  CARD:  RRR, no m/r/g, no peripheral edema, pulses intact, no cyanosis or clubbing.  GI:   Soft & nt; nml bowel sounds; no organomegaly or masses detected.   Musco: Warm bil, no deformities or joint swelling noted.   Neuro: alert, no focal deficits noted.    Skin: Warm, no lesions or rashes    Lab Results:  CBC    Component Value Date/Time   WBC 6.7 04/09/2017 0146   RBC 3.92 (L) 04/09/2017 0146   HGB 12.2 (L) 04/09/2017 0146   HGB 12.8 (L) 09/01/2014 2135   HCT 35.4 (L) 04/09/2017 0146   HCT 40.1 09/01/2014 2135   PLT 272 04/09/2017 0146   PLT 272 09/01/2014 2135   MCV 90.4 04/09/2017 0146   MCV 92 09/01/2014 2135   MCH 31.2 04/09/2017 0146   MCHC 34.5 04/09/2017 0146   RDW 13.7 04/09/2017 0146   RDW 14.3 09/01/2014 2135   LYMPHSABS 2.2 05/26/2016 0821   MONOABS 0.6 05/26/2016 0821   EOSABS 0.1 05/26/2016 0821   BASOSABS 0.1 05/26/2016 0821    BMET    Component Value Date/Time   NA 141 04/09/2017 0146   NA 144 09/01/2014 2135   K 3.7 04/09/2017 0146   K 4.0 09/01/2014 2135   CL 110 04/09/2017 0146   CL 109 (H) 09/01/2014 2135   CO2 26 04/09/2017 0146   CO2 25 09/01/2014 2135   GLUCOSE 115 (H) 04/09/2017 0146   GLUCOSE 193 (H) 09/01/2014 2135   BUN 17 04/09/2017 0146   BUN 17 09/01/2014 2135   CREATININE 1.14 04/09/2017 0146   CREATININE 1.40 (H) 09/01/2014 2135   CALCIUM 8.5 (L) 04/09/2017 0146   CALCIUM 8.7 09/01/2014 2135   GFRNONAA >60 04/09/2017 0146   GFRNONAA 57 (L) 09/01/2014 2135   GFRAA >60 04/09/2017 0146   GFRAA >60 09/01/2014 2135    BNP No results found for: BNP  ProBNP No results found for: PROBNP  Imaging: No results found.   Assessment & Plan:   52 year old patient seen office today.  We will start gabapentin 300 mg daily to help with chronic cough.  We will also do  chest x-ray today.  Emphasized importance of patient aggressively treating known  cause of cough such as taking Xyzal regularly, Protonix as prescribed, Tessalon as needed for cough, etc.  Discussed cardiopulmonary stress test results and encourage patient to get regular exercise.  Patient acknowledges and says that he will start to work on this.  Follow-up with Dr. Chase Alvarado in 4 to 6 weeks.  Contact our office before then to let us know how you are doing on the gabapentin.  If symptoms persist we may need to consider high-res CT.  Will do chest x-ray today.  OSA on CPAP Continue CPAP use  Notify us if you have breaks in therapy or issues using the machine   Other allergic rhinitis Xyzal daily  Dymista   Cough Will start gabapentin 300 mg daily to help with cough >>>Take this is not at night as it can cause her to be sleepy >>>Contact our office in about 1-2 weeks and let us know how you are doing on this dosage   Continue to take Tessalon as needed for cough, continue your Protonix, continue your Xyzal, can also use Chlortab's over-the-counter to help if you are having postnasal drip.  Try diligently to avoid triggers best you can triggers identified at this office appointment are: Stress, working outside, hard work.  Chest Xray today   . We believe you have a chronic/cyclical cough that is aggravated by reflux , coughing , and drainage.  . Goal is to not Cough or clear throat.  Marland Kitchen Avoid coughing or clearing throat by using:  o non-mint products/sugarless candy o Water o ice chips o Remember NO MINT PRODUCTS  . Medications to use:  o Mucinex DM 1-2 every 12 hrs or Delsym 2 tsp every 12 hrs f or cough o Tessalon Three times a day  As needed  Cough.  o Tramadol 50 mg every 4hrs as needed for breakthrough coughing until 100% cough free.  o Protonix 40mg  every 12 hours   o Xyzal 5mg  at bedtime o Chlor tabs 4mg  2 at bedtime  for nasal drip until cough is 100% cough free.    Follow up with our office in 4-6 weeks with Dr. Purnell Shoemaker.      Lauraine Rinne,  NP 05/04/2018

## 2018-05-04 NOTE — Assessment & Plan Note (Signed)
Xyzal daily  Dymista

## 2018-05-10 NOTE — Progress Notes (Signed)
Chest x-ray results of come back.  Showing no acute changes.  No further changes to plan of care at this time.  Wyn Quaker, FNP

## 2018-05-11 ENCOUNTER — Telehealth: Payer: Self-pay | Admitting: Pulmonary Disease

## 2018-05-11 NOTE — Telephone Encounter (Signed)
Called and spoke with patient regarding his chronic cough and SOB with exertion. Pt states that he has had fewer episodes of coughing since ov with B Mack 05/06/18  Per B.Mack patient was advised to take the below meds. Pt advised he is not taking all of the below prn he is only taking the gabapentin 3 times daily.   Medications to use:  ? Mucinex DM 1-2 every 12 hrs or Delsym 2 tsp every 12 hrs f or cough ? Tessalon Three times a day  As needed  Cough.  ? Tramadol 50 mg every 4hrs as needed for breakthrough coughing until 100% cough free.  ? Protonix 40mg  every 12 hours   ? Xyzal 5mg  at bedtime ? Chlor tabs 4mg  2 at bedtime  for nasal drip until cough is 100% cough free.  Pt was giving FYI, nothing further needed.

## 2018-06-09 ENCOUNTER — Telehealth: Payer: Self-pay | Admitting: Pulmonary Disease

## 2018-06-09 NOTE — Telephone Encounter (Signed)
Pt is returning call. Cb is [phone].

## 2018-06-09 NOTE — Telephone Encounter (Signed)
Attempted to call pt. I did not receive an answer. I have left a message for pt to return our call.  

## 2018-06-09 NOTE — Telephone Encounter (Signed)
Noted.  If symptoms persist or coughing episodes worsen please feel free to present to our office visit.  Thanks for letting me know.  Wyn Quaker, FNP

## 2018-06-09 NOTE — Telephone Encounter (Signed)
Called and spoke with patient regarding update on cough to Jesse Alvarado Pt reports two nights ago, woke up at 2am with productive cough-white foam color mucus Pt has to take off the cpap mask due to the severe coughing that last up to 75mins.  Jesse Alvarado please advise.

## 2018-06-17 ENCOUNTER — Ambulatory Visit: Payer: Managed Care, Other (non HMO) | Admitting: Internal Medicine

## 2018-07-15 ENCOUNTER — Encounter: Payer: Self-pay | Admitting: Internal Medicine

## 2018-07-15 ENCOUNTER — Ambulatory Visit (INDEPENDENT_AMBULATORY_CARE_PROVIDER_SITE_OTHER): Payer: Managed Care, Other (non HMO) | Admitting: Internal Medicine

## 2018-07-15 VITALS — BP 106/78 | HR 71 | Ht 70.8 in | Wt 179.6 lb

## 2018-07-15 DIAGNOSIS — R053 Chronic cough: Secondary | ICD-10-CM

## 2018-07-15 DIAGNOSIS — R6889 Other general symptoms and signs: Secondary | ICD-10-CM | POA: Diagnosis not present

## 2018-07-15 DIAGNOSIS — R059 Cough, unspecified: Secondary | ICD-10-CM

## 2018-07-15 DIAGNOSIS — R0989 Other specified symptoms and signs involving the circulatory and respiratory systems: Secondary | ICD-10-CM

## 2018-07-15 DIAGNOSIS — R05 Cough: Secondary | ICD-10-CM

## 2018-07-15 NOTE — Patient Instructions (Addendum)
ICD-10-CM   1. Chronic cough R05   2. Throat clearing R68.89     Do HRCT supine and prone  Do CT sinus without contrast  Will call with results  IF these are normal, refer to Dr Wanda Plump [facility] for cough because you are not responding to gabapentin  Continue gabapentin for now

## 2018-07-15 NOTE — Progress Notes (Signed)
IOV 03/25/2018  Chief Complaint  Patient presents with  . Consult    Self referral for asthma and trouble breathing per pt.  Pt states he was diagnosed with sports induced asthma about 20-25 years ago and states it has become worse. Pt has c/o SOB, wheezing, coughing, and chest tightnness.     [name] [address] y.o. of [place] [address] has been referred by Dr. Donneta Romberg.  I do not have the outside records with me.  History is gained from talking to the patient and reviewing old records at [facility].  As best as I can gather Mr. Schweitzer tells me that he was diagnosed with exercise-induced asthma approximately 20-25 years ago when he is to be a lot more physically fit in the aftermath of a workout would have shortness of breath chest tightness and wheezing which would be relieved by albuterol partially.  Over the years since then he has managed himself with Advair for a few years intermittently and also possibly Serevent intermittently but for most of the time with albuterol as needed.  During these times he did have recurrent bronchitis maybe 1 or 2 episodes a year and at one time even a pneumonia that left himself with some scar tissue in the lung.  He was admitted for 4 days in the hospital several years ago for this pneumonia.  Most recent admissions in the last few years show ER visits and admissions due to recurrent UTI and nephrolithiasis.  Of late in the last several months perhaps even a few years he has been bothered by significant allergies while cutting grass and having postnasal drip and increased wheezing after the exposure.  Therefore he saw Dr. Donneta Romberg breathing for 2018.  Apparently the allergy test were not much significant.  Patient was started on Symbicort.  He does not recollect exam nitric oxide testing but recollects spirometry that was abnormal.  However it appears that he did not respond in terms of his spirometry lung  function to the Symbicort.  Therefore he has been referred here.  He continues to be maintained on Symbicort and his exam nitric oxide today is 31 intermediate while being on Symbicort.  He says he continues to have symptoms of shortness of breath and wheezing particularly after the exposure when he returns back into the home  Asthma control questionnaire shows that he is waking up a few times because of asthma symptoms and when he wakes up he has no symptoms.  He is very slightly limited in his activities because of asthma.  He experiences some amount of shortness of breath and wheezing hardly any of the time.  He uses albuterol for rescue   Data available to me is as below's   ................. [name] - 31 pbb on symbucort ..........................................  CT angio ches tpersonally visualized and agree with the report below IMPRESSION: No evidence of pulmonary emboli.  Mild bibasilar atelectatic changes   Electronically Signed   By: Inez Catalina M.D.   On: 04/09/2017 15:12 ...................Marland Kitchen Xray sinususs - report - 09/24/17 - normal ...........................Marland Kitchen Results for KRUZE, ATCHLEY (MRN [id number]) as of 03/25/2018 10:04  Ref. Range 05/26/2016 08:21  Eosinophils Absolute Latest Ref Range: 0 - 0.7 K/uL 0.1    OV 04/08/2018  Chief Complaint  Patient presents with  . Follow-up    Pt has productive cough-foam white, SOB with exertion, some chest tightness more with yard work. Pt is more  fatigued and SOB with exertion has increased in last 4 weeks.   Christa See follows up for his out of proportion cough and wheezing.  At this visit he had pulmonary function testing.  In the interim he tells me his symptoms persist.  In addition he states that he has constant clearing of the throat.  He also says that he has exertional shortness of breath with wheezing when he climbs up stairs and walk down.  When we walked him 185 feet x 3 laps on room air in the office he was  observed to have mild shortness of breath by the CMA but he himself denied it.  The pulmonary function test today is shows normal spirometry.  His total lung capacity is slightly below normal at 74% and his DLCO slightly below normal at 77%.  All essentially close to normal.  His flow volume loop looked normal.  Review of the outside chart indicates most recent visit with Dr. Donneta Romberg was February 11, 2018.  Diagnosis was vasomotor rhinitis and mild persistent asthma.  It was noted that he was seen ENT Dr. Tami Ribas.  He was having worsening symptoms such as cough and wheezing working in the yard and therefore this referral was made.  On a prior visit it was noted by Dr. Donneta Romberg that his allergen test was negative on epicutaneous and intradermal levels.  Flu test was also negative.  He did have a spirometry with Dr. Donneta Romberg on July 30, 2017 that was abnormal with a FEV1 of 2.66 L 68% and a ratio of 78%. Similar results April 2019 sprirometrty    Simple office walk 185 feet x  3 laps goal with forehead probe 04/08/2018   O2 used Room air  Number laps completed 3  Comments about pace Normal pace  Resting Pulse Ox/HR 99% and 102/min  Final Pulse Ox/HR 99% and 112/min  Desaturated </= 88% no  Desaturated <= 3% points no  Got Tachycardic >/= 90/min yes  Symptoms at end of test Mild dyspnea  Miscellaneous comments x   Results for NINO, AMANO (MRN [id number]) as of 04/08/2018 12:19  Ref. Range 04/08/2018 10:43  FVC-Post Latest Units: L 3.94  FVC-%Pred-Post Latest Units: % 82  FVC-%Change-Post Latest Units: % -6  FEV1-Post Latest Units: L 3.32  FEV1-%Pred-Post Latest Units: % 90  FEV1-%Change-Post Latest Units: % 0  Post FEV1/FVC ratio Latest Units: % 84  Results for GUILLAUME, WENINGER (MRN [id number]) as of 04/08/2018 12:19  Ref. Range 04/08/2018 10:43  TLC Latest Units: L 6.69  TLC % pred Latest Units: % 98  Results for LEMUEL, BOODRAM (MRN [id number]) as of 04/08/2018 12:19  Ref. Range 04/08/2018  10:43  DLCO unc Latest Units: ml/min/mmHg 24.10  DLCO unc % pred Latest Units: % 77     04/08/2018-office visit-Joeangel Jeanpaul patient reports he has constant clearing of the throat, symptoms of shortness of breath and exertional shortness of breath persists, occasional wheezing when climbing up and down stairs.  Was walked in office today at 185 feet x 3 laps on room air CMA performing the test said that he had shortness of breath, patient denied it.  Pulmonary function test completed today shows normal spirometry.  Total lung capacity slightly below normal at 74% and his DLCO is slightly below normal at 77% all close to normal flow volume loop looked normal. Plan suspicion cough neuropathy, will do cardiopulmonary test, and if nothing develops then can do gabapentin for cough  Tests:  04/08/2018-pulmonary function test  ratio 79, 84, FEV1 90, no bronchodilator response, no abnormal concavity on flow volume loops 03/25/18 - [name] - 31   Imaging:  09/24/2017- sinuses-no evidence of sinusitis 04/09/2017-CT Angio- no evidence of pulmonary emboli  Cardiac:  04/22/2018-cardiopulmonary exercise test- no indication for exercise-induced bronchospasm, does not demonstrate any evidence for cardiopulmonary limitation, most likely patient is deconditioned  05/04/18  OV  Pleasant patient seen in office today for follow-up to discuss cardiopulmonary stress test as well as to continue to discuss chronic cough.  Patient is accompanied with wife today.  Patient reporting that cough is still present, occasionally productive.  Patient reports known triggers of stress, working outside/doing yard work, and also waking up.  Patient reports that 3 weeks ago he did have one episode after a large coughing fit where he had blood streaks in his sputum.  Patient and wife both confirm that when patient is coughing vigorously he is able to bring up sputum.  Patient is adherent to his Protonix, Xyzal, Symbicort.  Unfortunately patient  has not been using Tessalon.  Patient is also not been using his Ultram as he says that it was affecting his sleep.  OV 07/15/2018  Subjective:  Patient ID: [name], male , DOB: August 21, 1966 , age 87 y.o. , MRN: [id number] , ADDRESS: [place] [address]   07/15/2018 -   Chief Complaint  Patient presents with  . Follow-up    some cough but thinks it is due to stress, plegm sometimes yellowish but sometimes it is white/foamy     HPI HALDEN PHEGLEY 52 y.o. - follow-up chronic cough. He is now on gabapentin. He says this is improved his cough but only somewhat. He still is coughing a lot. His wife is here with him. They both say that any stress or talking can make his cough significantly worse. He clears his throat a lot. And when he coughs it is pretty violent cough spells. There is also postnasal drip. He says he has chronic sinus issues. He has not seen ENT in many years. His last CT scan of the chest was summer 2018. There is no CT sinus on record.     Dr Lorenza Cambridge Reflux Symptom Index (> 13-15 suggestive of LPR cough)  07/15/2018   Hoarseness of problem with voice 2  Clearing  Of Throat 4  Excess throat mucus or feeling of post nasal drip 5  Difficulty swallowing food, liquid or tablets 0  Cough after eating or lying down 4  Breathing difficulties or choking episodes 2.5  Troublesome or annoying cough 3.5  Sensation of something sticking in throat or lump in throat 0  Heartburn, chest pain, indigestion, or stomach acid coming up   TOTAL 27     ROS - per HPI     has a past medical history of Anemia, Arthritis, Asthma, Cancer ([place]), Chronic kidney disease, Diabetes mellitus without complication ([place]), Diverticulosis, Erectile dysfunction, GERD (gastroesophageal reflux disease), Hyperlipidemia, Hypertension, Kidney stone, and Sleep apnea.   reports that he has never smoked. He has never used smokeless tobacco.  Past Surgical History:  Procedure  Laterality Date  . CYSTOSCOPY WITH STENT PLACEMENT Bilateral 06/05/2016   Procedure: CYSTOSCOPY WITH STENT PLACEMENT;  Surgeon: Nickie Retort, MD;  Location: [facility] ORS;  Service: Urology;  Laterality: Bilateral;  . CYSTOSCOPY/URETEROSCOPY/HOLMIUM LASER/STENT PLACEMENT Left 04/13/2016   Procedure: CYSTOSCOPY/RETROGRADE PYELOGRAM/URETEROSCOPY WITH HOLMIUM LASER LITHOTRIPSY//STENT PLACEMENT;  Surgeon: Festus Aloe, MD;  Location: [facility] ORS;  Service: Urology;  Laterality: Left;  .  ESOPHAGOGASTRODUODENOSCOPY (EGD) WITH PROPOFOL N/A 12/27/2015   Procedure: ESOPHAGOGASTRODUODENOSCOPY (EGD) WITH PROPOFOL;  Surgeon: Manya Silvas, MD;  Location: [facility] ENDOSCOPY;  Service: Endoscopy;  Laterality: N/A;  . EXTRACORPOREAL SHOCK WAVE LITHOTRIPSY Right 12/30/2017   Procedure: EXTRACORPOREAL SHOCK WAVE LITHOTRIPSY (ESWL);  Surgeon: Royston Cowper, MD;  Location: [facility] ORS;  Service: Urology;  Laterality: Right;  . HERNIA REPAIR  6712   umbilical  . KNEE ARTHROSCOPY Right 2015   had bursa sack repaired  . PREPATELLAR BURSA EXCISION Left 1993   and ostetomy. had at least 5 surgeries in 15 years  . SHOULDER SURGERY Right 2011   tendon was shredded and was trimmed, repaired and reattached. Screws in shoulder  . URETEROSCOPY WITH HOLMIUM LASER LITHOTRIPSY Bilateral 06/05/2016   Procedure: URETEROSCOPY WITH HOLMIUM LASER LITHOTRIPSY;  Surgeon: Nickie Retort, MD;  Location: [facility] ORS;  Service: Urology;  Laterality: Bilateral;  . VASECTOMY  2002    Allergies  Allergen Reactions  . Ciprofloxacin Other (See Comments)    GI upset    Immunization History  Administered Date(s) Administered  . Hep A / Hep B 09/10/2017, 10/22/2017  . Influenza Split 08/13/2015  . Influenza,inj,Quad PF,6+ Mos 08/09/2017  . Pneumococcal-Unspecified 11/10/2011  . Tdap 07/31/2016    Family History  Problem Relation Age of Onset  . Urolithiasis Father   . Kidney disease Father   . Prostate cancer Neg Hx   . Kidney  cancer Neg Hx      Current Outpatient Medications:  .  aspirin EC 81 MG EC tablet, Take 1 tablet (81 mg total) by mouth daily., Disp: 30 tablet, Rfl: 0 .  Azelastine-Fluticasone (DYMISTA) 137-50 MCG/ACT SUSP, Place into the nose., Disp: , Rfl:  .  benzonatate (TESSALON) 200 MG capsule, Take 1 capsule (200 mg total) by mouth 3 (three) times daily as needed for cough., Disp: 20 capsule, Rfl: 4 .  calcium citrate-vitamin D (CITRACAL+D) 315-200 MG-UNIT tablet, Take by mouth., Disp: , Rfl:  .  CICLOPIROX EX, Apply 1 application topically at bedtime., Disp: , Rfl:  .  cyanocobalamin (,VITAMIN B-12,) 1000 MCG/ML injection, Inject into the muscle., Disp: , Rfl:  .  FARXIGA 5 MG TABS tablet, , Disp: , Rfl: 2 .  fenofibrate (TRICOR) 145 MG tablet, Take 145 mg by mouth daily. , Disp: , Rfl:  .  ferrous gluconate (FERGON) 324 MG tablet, TAKE 1 TABLET BY MOUTH THREE TIMES DAILY WITH MEALS, Disp: , Rfl:  .  folic acid (FOLVITE) 1 MG tablet, Take 1 mg by mouth daily., Disp: , Rfl:  .  gabapentin (NEURONTIN) 300 MG capsule, Take 1 capsule (300 mg total) by mouth at bedtime., Disp: 30 capsule, Rfl: 3 .  glimepiride (AMARYL) 4 MG tablet, Take 2 mg by mouth 2 (two) times daily. Patient takes 2mg  morning, 2mg  afternoon, and 4 mg at bedtime daily. Totals 8mg  daily., Disp: , Rfl:  .  ipratropium (ATROVENT) 0.06 % nasal spray, U 2 SPRAYS IEN TID, Disp: , Rfl: 3 .  levocetirizine (XYZAL) 5 MG tablet, Take 5 mg by mouth daily., Disp: , Rfl: 11 .  losartan (COZAAR) 50 MG tablet, Take 50 mg by mouth daily. , Disp: , Rfl:  .  metFORMIN (GLUCOPHAGE) 850 MG tablet, Take 1 tablet by mouth 3 (three) times daily., Disp: , Rfl:  .  Multiple Vitamin (MULTI-VITAMINS) TABS, Take 1 tablet by mouth daily. , Disp: , Rfl:  .  pantoprazole (PROTONIX) 40 MG tablet, Take 40 mg by mouth 2 (two)  times daily. , Disp: , Rfl:  .  pravastatin (PRAVACHOL) 10 MG tablet, Take 10 mg by mouth daily., Disp: , Rfl:  .  PROAIR RESPICLICK 408 (90  Base) MCG/ACT AEPB, INL 2 PFS PO Q 6 H PRN, Disp: , Rfl: 0 .  sildenafil (VIAGRA) 100 MG tablet, Take 100 mg by mouth daily as needed for erectile dysfunction., Disp: , Rfl:  .  STENDRA 200 MG TABS, U UTD, Disp: , Rfl: 6 .  sucralfate (CARAFATE) 1 g tablet, Take 1 g by mouth 2 (two) times daily., Disp: , Rfl:  .  SYMBICORT 160-4.5 MCG/ACT inhaler, INHALE 2 PUFFS PO BID, Disp: , Rfl: 3 .  tiZANidine (ZANAFLEX) 2 MG tablet, Take 2 mg by mouth at bedtime as needed for muscle spasms. , Disp: , Rfl:  .  traMADol (ULTRAM) 50 MG tablet, Take 50 mg by mouth every 6 (six) hours as needed for moderate pain. Reported on 05/22/2016, Disp: , Rfl:  .  TRULICITY 1.44 YJ/8.5UD SOPN, INJ 0.75 MG [place] Q 7 DAYS, Disp: , Rfl: 3 .  vitamin C (ASCORBIC ACID) 500 MG tablet, Take 500 mg by mouth daily., Disp: , Rfl:       Objective:   Vitals:   07/15/18 1154  BP: 106/78  Pulse: 71  SpO2: 98%  Weight: 179 lb 9.6 oz (81.5 kg)  Height: 5' 10.8" (1.798 m)    Estimated body mass index is 25.19 kg/m as calculated from the following:   Height as of this encounter: 5' 10.8" (1.798 m).   Weight as of this encounter: 179 lb 9.6 oz (81.5 kg).  @WEIGHTCHANGE @  [employer]   07/15/18 1154  Weight: 179 lb 9.6 oz (81.5 kg)     Physical Exam  General Appearance:    Alert, cooperative, no distress, appears stated age - yes , sitting on - chari  Head:    Normocephalic, without obvious abnormality, atraumatic  Eyes:    PERRL, conjunctiva/corneas clear,  Ears:    Normal TM's and external ear canals, both ears  Nose:   Nares normal, septum midline, mucosa normal, no drainage    or sinus tenderness. OXYGEN ON  - no . Patient is @ ra   Throat:   Lips, mucosa, and tongue normal; teeth and gums normal. Cyanosis on lips - no. CLEARING THROAT +++  Neck:   Supple, symmetrical, trachea midline, no adenopathy;    thyroid:  no enlargement/tenderness/nodules; no carotid   bruit or JVD  Back:     Symmetric, no curvature, ROM  normal, no CVA tenderness  Lungs:     Distress - no , Wheeze no, Barrell Chest - no, Purse lip breathing - no, Crackles - possibly at base   Chest Wall:    No tenderness or deformity. Scars in chest no   Heart:    Regular rate and rhythm, S1 and S2 normal, no murmur, rub   or gallop  Breast Exam:    NOT DONE  Abdomen:     Soft, non-tender, bowel sounds active all four quadrants,    no masses, no organomegaly  Genitalia:   NOT DONE  Rectal:   NOT DONE  Extremities:   Extremities normal, atraumatic, Clubbing - no, Edema - no  Pulses:   2+ and symmetric all extremities  Skin:   Stigmata of Connective Tissue Disease - no  Lymph nodes:   Cervical, supraclavicular, and axillary nodes normal  Psychiatric:  Neurologic:   pleasant CNII-XII intact, normal strength, sensation  throughout           Assessment:       ICD-10-CM   1. Chronic cough R05   2. Throat clearing R68.89        Plan:      Do HRCT supine and prone  Do CT sinus without contrast  Will call with results  IF these are normal, refer to Dr Wanda Plump [facility] for cough because you are not responding to gabapentin  Continue gabapentin for now   SIGNATURE    Dr. Brand Males, M.D., F.C.C.P,  Pulmonary and Critical Care Medicine Staff Physician, [place] Director - Interstitial Lung Disease  Program  Pulmonary [place], [place], [address]  Pager: [phone], If no answer or between  15:00h - 7:00h: call [phone] Telephone: [phone]  12:33 PM 07/15/2018

## 2018-07-29 ENCOUNTER — Ambulatory Visit
Admission: RE | Admit: 2018-07-29 | Discharge: 2018-07-29 | Disposition: A | Discharge status: Home / Self Care | Payer: Managed Care, Other (non HMO) | Source: Ambulatory Visit | Attending: Internal Medicine | Admitting: Internal Medicine

## 2018-07-29 ENCOUNTER — Other Ambulatory Visit: Payer: Managed Care, Other (non HMO)

## 2018-07-29 DIAGNOSIS — R059 Cough, unspecified: Secondary | ICD-10-CM

## 2018-07-29 DIAGNOSIS — K859 Acute pancreatitis without necrosis or infection, unspecified: Secondary | ICD-10-CM | POA: Diagnosis not present

## 2018-07-29 DIAGNOSIS — K853 Drug induced acute pancreatitis without necrosis or infection: Secondary | ICD-10-CM | POA: Diagnosis not present

## 2018-07-29 DIAGNOSIS — R05 Cough: Secondary | ICD-10-CM

## 2018-07-30 ENCOUNTER — Emergency Department: Payer: Managed Care, Other (non HMO)

## 2018-07-30 ENCOUNTER — Inpatient Hospital Stay
Admission: EM | Admit: 2018-07-30 | Discharge: 2018-07-31 | DRG: 440 | Disposition: A | Discharge status: Home / Self Care | Payer: Managed Care, Other (non HMO) | Attending: Internal Medicine | Admitting: Internal Medicine

## 2018-07-30 ENCOUNTER — Other Ambulatory Visit: Payer: Self-pay

## 2018-07-30 DIAGNOSIS — Z841 Family history of disorders of kidney and ureter: Secondary | ICD-10-CM | POA: Diagnosis not present

## 2018-07-30 DIAGNOSIS — I10 Essential (primary) hypertension: Secondary | ICD-10-CM | POA: Diagnosis present

## 2018-07-30 DIAGNOSIS — J45909 Unspecified asthma, uncomplicated: Secondary | ICD-10-CM | POA: Diagnosis present

## 2018-07-30 DIAGNOSIS — E1165 Type 2 diabetes mellitus with hyperglycemia: Secondary | ICD-10-CM | POA: Diagnosis present

## 2018-07-30 DIAGNOSIS — K853 Drug induced acute pancreatitis without necrosis or infection: Principal | ICD-10-CM | POA: Diagnosis present

## 2018-07-30 DIAGNOSIS — E785 Hyperlipidemia, unspecified: Secondary | ICD-10-CM | POA: Diagnosis present

## 2018-07-30 DIAGNOSIS — E781 Pure hyperglyceridemia: Secondary | ICD-10-CM | POA: Diagnosis present

## 2018-07-30 DIAGNOSIS — Z7951 Long term (current) use of inhaled steroids: Secondary | ICD-10-CM | POA: Diagnosis not present

## 2018-07-30 DIAGNOSIS — K219 Gastro-esophageal reflux disease without esophagitis: Secondary | ICD-10-CM | POA: Diagnosis present

## 2018-07-30 DIAGNOSIS — K859 Acute pancreatitis without necrosis or infection, unspecified: Secondary | ICD-10-CM | POA: Diagnosis present

## 2018-07-30 DIAGNOSIS — Z7984 Long term (current) use of oral hypoglycemic drugs: Secondary | ICD-10-CM | POA: Diagnosis not present

## 2018-07-30 DIAGNOSIS — Z7982 Long term (current) use of aspirin: Secondary | ICD-10-CM

## 2018-07-30 DIAGNOSIS — T383X5A Adverse effect of insulin and oral hypoglycemic [antidiabetic] drugs, initial encounter: Secondary | ICD-10-CM | POA: Diagnosis present

## 2018-07-30 LAB — HEPATIC FUNCTION PANEL
ALBUMIN: 4.2 g/dL (ref 3.5–5.0)
ALK PHOS: 51 U/L (ref 38–126)
ALT: 23 U/L (ref 0–44)
AST: 23 U/L (ref 15–41)
BILIRUBIN TOTAL: 0.3 mg/dL (ref 0.3–1.2)
Total Protein: 7.1 g/dL (ref 6.5–8.1)

## 2018-07-30 LAB — CBC
HEMATOCRIT: 40.6 % (ref 40.0–52.0)
Hemoglobin: 13.6 g/dL (ref 13.0–18.0)
MCH: 29.4 pg (ref 26.0–34.0)
MCHC: 33.6 g/dL (ref 32.0–36.0)
MCV: 87.5 fL (ref 80.0–100.0)
Platelets: 368 10*3/uL (ref 150–440)
RBC: 4.64 MIL/uL (ref 4.40–5.90)
RDW: 15.1 % — ABNORMAL HIGH (ref 11.5–14.5)
WBC: 11.5 10*3/uL — AB (ref 3.8–10.6)

## 2018-07-30 LAB — HEMOGLOBIN A1C
Hgb A1c MFr Bld: 7.4 % — ABNORMAL HIGH (ref 4.8–5.6)
Mean Plasma Glucose: 165.68 mg/dL

## 2018-07-30 LAB — LACTIC ACID, PLASMA
LACTIC ACID, VENOUS: 1.7 mmol/L (ref 0.5–1.9)
Lactic Acid, Venous: 2.8 mmol/L (ref 0.5–1.9)

## 2018-07-30 LAB — GLUCOSE, CAPILLARY
GLUCOSE-CAPILLARY: 127 mg/dL — AB (ref 70–99)
GLUCOSE-CAPILLARY: 131 mg/dL — AB (ref 70–99)
Glucose-Capillary: 90 mg/dL (ref 70–99)
Glucose-Capillary: 65 mg/dL — ABNORMAL LOW (ref 70–99)

## 2018-07-30 LAB — BASIC METABOLIC PANEL
Anion gap: 9 (ref 5–15)
BUN: 22 mg/dL — ABNORMAL HIGH (ref 6–20)
CHLORIDE: 110 mmol/L (ref 98–111)
CO2: 23 mmol/L (ref 22–32)
CREATININE: 1.35 mg/dL — AB (ref 0.61–1.24)
Calcium: 9.9 mg/dL (ref 8.9–10.3)
GFR calc Af Amer: >60 mL/min (ref >60)
GFR calc non Af Amer: 58 mL/min — ABNORMAL LOW (ref >60)
GLUCOSE: 232 mg/dL — AB (ref 70–99)
POTASSIUM: 4.2 mmol/L (ref 3.5–5.1)
SODIUM: 142 mmol/L (ref 135–145)

## 2018-07-30 LAB — LIPID PANEL
CHOL/HDL RATIO: 5.9 ratio
CHOLESTEROL: 218 mg/dL — AB (ref 0–200)
HDL: 37 mg/dL — ABNORMAL LOW (ref >40)
LDL Cholesterol: 118 mg/dL — ABNORMAL HIGH (ref 0–99)
Triglycerides: 314 mg/dL — ABNORMAL HIGH (ref <150)
VLDL: 63 mg/dL — ABNORMAL HIGH (ref 0–40)

## 2018-07-30 LAB — LIPASE, BLOOD: LIPASE: 328 U/L — AB (ref 11–51)

## 2018-07-30 LAB — TROPONIN I: Troponin I: <0.03 ng/mL (ref <0.03)

## 2018-07-30 MED ORDER — ONDANSETRON HCL 4 MG/2ML IJ SOLN
4.0000 mg | Freq: Once | INTRAMUSCULAR | Status: AC
  Administered 2018-07-30: 4 mg via INTRAVENOUS
  Filled 2018-07-30: qty 2

## 2018-07-30 MED ORDER — MORPHINE SULFATE (PF) 4 MG/ML IV SOLN
8.0000 mg | Freq: Once | INTRAVENOUS | Status: AC
  Administered 2018-07-30: 8 mg via INTRAVENOUS
  Filled 2018-07-30: qty 2

## 2018-07-30 MED ORDER — ASPIRIN EC 81 MG PO TBEC
81.0000 mg | DELAYED_RELEASE_TABLET | Freq: Every day | ORAL | Status: DC
  Administered 2018-07-30 – 2018-07-31 (×2): 81 mg via ORAL
  Filled 2018-07-30 (×3): qty 1

## 2018-07-30 MED ORDER — ONDANSETRON HCL 4 MG/2ML IJ SOLN: 4.0000 mg | Freq: Four times a day (QID) | INTRAMUSCULAR | Status: DC | PRN

## 2018-07-30 MED ORDER — PNEUMOCOCCAL VAC POLYVALENT 25 MCG/0.5ML IJ INJ
0.5000 mL | INJECTION | INTRAMUSCULAR | Status: DC
  Filled 2018-07-30: qty 0.5

## 2018-07-30 MED ORDER — TRAMADOL HCL 50 MG PO TABS
50.0000 mg | ORAL_TABLET | Freq: Four times a day (QID) | ORAL | Status: DC | PRN
  Administered 2018-07-30 (×2): 50 mg via ORAL
  Filled 2018-07-30 (×3): qty 1

## 2018-07-30 MED ORDER — INFLUENZA VAC SPLIT QUAD 0.5 ML IM SUSY
0.5000 mL | PREFILLED_SYRINGE | INTRAMUSCULAR | Status: DC
  Filled 2018-07-30: qty 0.5

## 2018-07-30 MED ORDER — IOPAMIDOL (ISOVUE-370) INJECTION 76%
125.0000 mL | Freq: Once | INTRAVENOUS | Status: AC | PRN
  Administered 2018-07-30: 125 mL via INTRAVENOUS

## 2018-07-30 MED ORDER — FOLIC ACID 1 MG PO TABS
1.0000 mg | ORAL_TABLET | Freq: Every day | ORAL | Status: DC
  Administered 2018-07-30 – 2018-07-31 (×2): 1 mg via ORAL
  Filled 2018-07-30 (×3): qty 1

## 2018-07-30 MED ORDER — SODIUM CHLORIDE 0.9 % IV BOLUS
1000.0000 mL | Freq: Once | INTRAVENOUS | Status: AC
  Administered 2018-07-30: 1000 mL via INTRAVENOUS

## 2018-07-30 MED ORDER — DEXTROSE 50 % IV SOLN: 25.0000 mL | Freq: Once | INTRAVENOUS | Status: AC

## 2018-07-30 MED ORDER — ADULT MULTIVITAMIN W/MINERALS CH
1.0000 | ORAL_TABLET | Freq: Every day | ORAL | Status: DC
  Filled 2018-07-30: qty 1

## 2018-07-30 MED ORDER — PANTOPRAZOLE SODIUM 40 MG PO TBEC
40.0000 mg | DELAYED_RELEASE_TABLET | Freq: Two times a day (BID) | ORAL | Status: DC
  Administered 2018-07-30 – 2018-07-31 (×3): 40 mg via ORAL
  Filled 2018-07-30 (×3): qty 1

## 2018-07-30 MED ORDER — MOMETASONE FURO-FORMOTEROL FUM 200-5 MCG/ACT IN AERO
2.0000 | INHALATION_SPRAY | Freq: Two times a day (BID) | RESPIRATORY_TRACT | Status: DC
  Administered 2018-07-30 – 2018-07-31 (×3): 2 via RESPIRATORY_TRACT
  Filled 2018-07-30: qty 8.8

## 2018-07-30 MED ORDER — FERROUS GLUCONATE 324 (38 FE) MG PO TABS
324.0000 mg | ORAL_TABLET | Freq: Two times a day (BID) | ORAL | Status: DC
  Administered 2018-07-30 – 2018-07-31 (×2): 324 mg via ORAL
  Filled 2018-07-30 (×4): qty 1

## 2018-07-30 MED ORDER — SODIUM CHLORIDE 0.9 % IV SOLN
INTRAVENOUS | Status: DC
  Administered 2018-07-30 – 2018-07-31 (×2): via INTRAVENOUS

## 2018-07-30 MED ORDER — FENOFIBRATE 160 MG PO TABS
160.0000 mg | ORAL_TABLET | Freq: Every day | ORAL | Status: DC
  Administered 2018-07-30 – 2018-07-31 (×2): 160 mg via ORAL
  Filled 2018-07-30 (×2): qty 1

## 2018-07-30 MED ORDER — DEXTROSE 50 % IV SOLN
INTRAVENOUS | Status: AC
  Administered 2018-07-30: 17:00:00
  Filled 2018-07-30: qty 50

## 2018-07-30 MED ORDER — BENZONATATE 100 MG PO CAPS: 200.0000 mg | ORAL_CAPSULE | Freq: Three times a day (TID) | ORAL | Status: DC | PRN

## 2018-07-30 MED ORDER — LEVOCETIRIZINE DIHYDROCHLORIDE 5 MG PO TABS: 5.0000 mg | ORAL_TABLET | Freq: Every day | ORAL | Status: DC

## 2018-07-30 MED ORDER — LOSARTAN POTASSIUM 50 MG PO TABS
50.0000 mg | ORAL_TABLET | Freq: Every day | ORAL | Status: DC
  Administered 2018-07-30 – 2018-07-31 (×2): 50 mg via ORAL
  Filled 2018-07-30 (×3): qty 1

## 2018-07-30 MED ORDER — ENOXAPARIN SODIUM 40 MG/0.4ML ~~LOC~~ SOLN
40.0000 mg | SUBCUTANEOUS | Status: DC
  Administered 2018-07-30 – 2018-07-31 (×2): 40 mg via SUBCUTANEOUS
  Filled 2018-07-30 (×2): qty 0.4

## 2018-07-30 MED ORDER — ACETAMINOPHEN 325 MG PO TABS: 650.0000 mg | ORAL_TABLET | Freq: Four times a day (QID) | ORAL | Status: DC | PRN

## 2018-07-30 MED ORDER — LORATADINE 10 MG PO TABS
10.0000 mg | ORAL_TABLET | Freq: Every day | ORAL | Status: DC
  Administered 2018-07-30 – 2018-07-31 (×2): 10 mg via ORAL
  Filled 2018-07-30 (×3): qty 1

## 2018-07-30 MED ORDER — ACETAMINOPHEN 650 MG RE SUPP: 650.0000 mg | Freq: Four times a day (QID) | RECTAL | Status: DC | PRN

## 2018-07-30 MED ORDER — VITAMIN C 500 MG PO TABS
500.0000 mg | ORAL_TABLET | Freq: Every day | ORAL | Status: DC
  Administered 2018-07-30: 500 mg via ORAL
  Filled 2018-07-30 (×3): qty 1

## 2018-07-30 MED ORDER — SUCRALFATE 1 G PO TABS
1.0000 g | ORAL_TABLET | Freq: Two times a day (BID) | ORAL | Status: DC
  Administered 2018-07-30 – 2018-07-31 (×3): 1 g via ORAL
  Filled 2018-07-30 (×4): qty 1

## 2018-07-30 MED ORDER — ONDANSETRON HCL 4 MG PO TABS: 4.0000 mg | ORAL_TABLET | Freq: Four times a day (QID) | ORAL | Status: DC | PRN

## 2018-07-30 MED ORDER — TIZANIDINE HCL 2 MG PO TABS
2.0000 mg | ORAL_TABLET | Freq: Every evening | ORAL | Status: DC | PRN
  Filled 2018-07-30: qty 1

## 2018-07-30 MED ORDER — INSULIN ASPART 100 UNIT/ML ~~LOC~~ SOLN: 0.0000 [IU] | Freq: Every day | SUBCUTANEOUS | Status: DC

## 2018-07-30 MED ORDER — MORPHINE SULFATE (PF) 2 MG/ML IV SOLN
2.0000 mg | INTRAVENOUS | Status: DC | PRN
  Administered 2018-07-30 (×3): 2 mg via INTRAVENOUS
  Filled 2018-07-30 (×3): qty 1

## 2018-07-30 MED ORDER — INSULIN ASPART 100 UNIT/ML ~~LOC~~ SOLN: 0.0000 [IU] | Freq: Three times a day (TID) | SUBCUTANEOUS | Status: DC

## 2018-07-30 MED ORDER — GABAPENTIN 300 MG PO CAPS
300.0000 mg | ORAL_CAPSULE | Freq: Every day | ORAL | Status: DC
  Administered 2018-07-30: 300 mg via ORAL
  Filled 2018-07-30: qty 1

## 2018-07-30 MED ORDER — PRAVASTATIN SODIUM 10 MG PO TABS
10.0000 mg | ORAL_TABLET | Freq: Every day | ORAL | Status: DC
  Filled 2018-07-30 (×2): qty 1

## 2018-07-30 NOTE — ED Provider Notes (Signed)
[facility] Emergency Department Provider Note  ____________________________________________   First MD Initiated Contact with Patient 07/30/18 [phone]     (approximate)  I have reviewed the triage vital signs and the nursing notes.   HISTORY  Chief Complaint Chest Pain   HPI Jesse Alvarado is a 52 y.o. male who self presents to the emergency department with severe epigastric pain radiating directly to his back that began earlier this afternoon.  The pain was gradual onset but quickly became quite severe.  He initially went to work but was unable to tolerate the pain and nausea so he came to the emergency department.  He has a strong family history of aortic aneurysm and aortic dissection which concerned him.  He does have lower chest pain.  The pain is severe and burning.  He is a diabetic.  He drinks alcohol only occasionally.  He has had no medication changes recently except his Trulicity dose was increased.  He has had nausea but no vomiting.  He has had no history of abdominal surgeries although has had lithotripsy multiple times.   Past Medical History:  Diagnosis Date  . Anemia    taking 3 iron pills a day  . Arthritis   . Asthma    sports induced asthma. takes inhalers when needed  . Cancer (HCC)    Basal cell  . Chronic kidney disease    kidney stones  . Diabetes mellitus without complication ([place])   . Diverticulosis   . Erectile dysfunction   . GERD (gastroesophageal reflux disease)   . Hyperlipidemia   . Hypertension    per patient, he does not have high bp but is treated for his kidneys and his diabetes  . Kidney stone   . Sleep apnea    use C-PAP    Patient Active Problem List   Diagnosis Date Noted  . Acute pancreatitis 07/30/2018  . Cough 05/04/2018  . Chest pain 04/08/2017  . OSA on CPAP 04/18/2016  . Nephrolithiasis 04/13/2016  . Chronic midline low back pain without sciatica 01/25/2016  . Primary osteoarthritis involving  multiple joints 11/20/2015  . Bilateral carpal tunnel syndrome 09/14/2015  . Chronic anemia 09/14/2015  . Essential hypertension 09/14/2015  . Chronic knee pain 08/30/2014  . Erectile dysfunction 08/30/2014  . Other allergic rhinitis 08/30/2014  . Pain in both hands 08/30/2014  . Onychomycosis of toenail 07/24/2014  . Pain in shoulder 05/01/2014  . Hypertriglyceridemia 04/23/2014  . Type 2 diabetes mellitus ([place]) 04/23/2014    Past Surgical History:  Procedure Laterality Date  . CYSTOSCOPY WITH STENT PLACEMENT Bilateral 06/05/2016   Procedure: CYSTOSCOPY WITH STENT PLACEMENT;  Surgeon: Nickie Retort, MD;  Location: [facility] ORS;  Service: Urology;  Laterality: Bilateral;  . CYSTOSCOPY/URETEROSCOPY/HOLMIUM LASER/STENT PLACEMENT Left 04/13/2016   Procedure: CYSTOSCOPY/RETROGRADE PYELOGRAM/URETEROSCOPY WITH HOLMIUM LASER LITHOTRIPSY//STENT PLACEMENT;  Surgeon: Festus Aloe, MD;  Location: [facility] OR[facility];  Service: Urology;  Laterality: Left;  . ESOPHAGOGASTRODUODENOSCOPY (EGD) WITH PROPOFOL N/A 12/27/2015   Procedure: ESOPHAGOGASTRODUODENOSCOPY (EGD) WITH PROPOFOL;  Surgeon: Manya Silvas, MD;  Location: [facility] ENDOSCOPY;  Service: Endoscopy;  Laterality: N/A;  . EXTRACORPOREAL SHOCK WAVE LITHOTRIPSY Right 12/30/2017   Procedure: EXTRACORPOREAL SHOCK WAVE LITHOTRIPSY (ESWL);  Surgeon: Royston Cowper, MD;  Location: [facility] ORS;  Service: Urology;  Laterality: Right;  . HERNIA REPAIR  4008   umbilical  . KNEE ARTHROSCOPY Right 2015   had bursa sack repaired  . PREPATELLAR BURSA EXCISION Left 1993   and ostetomy. had at  least 5 surgeries in 15 years  . SHOULDER SURGERY Right 2011   tendon was shredded and was trimmed, repaired and reattached. Screws in shoulder  . URETEROSCOPY WITH HOLMIUM LASER LITHOTRIPSY Bilateral 06/05/2016   Procedure: URETEROSCOPY WITH HOLMIUM LASER LITHOTRIPSY;  Surgeon: Nickie Retort, MD;  Location: [facility] ORS;  Service: Urology;  Laterality: Bilateral;  . VASECTOMY   2002    Prior to Admission medications   Medication Sig Start Date End Date Taking? Authorizing Provider  aspirin EC 81 MG EC tablet Take 1 tablet (81 mg total) by mouth daily. 04/10/17   Loletha Grayer, MD  Azelastine-Fluticasone [facility]) 137-50 MCG/ACT SUSP Place into the nose.    [provider]  benzonatate (TESSALON) 200 MG capsule Take 1 capsule (200 mg total) by mouth 3 (three) times daily as needed for cough. 05/04/18   Lauraine Rinne, NP  calcium citrate-vitamin D (CITRACAL+D) 315-200 MG-UNIT tablet Take by mouth.    [provider]  CICLOPIROX EX Apply 1 application topically at bedtime.    [provider]  cyanocobalamin (,VITAMIN B-12,) 1000 MCG/ML injection Inject into the muscle. 03/23/18   [provider]  FARXIGA 5 MG TABS tablet  03/24/18   [provider]  fenofibrate (TRICOR) 145 MG tablet Take 145 mg by mouth daily.  07/16/16   [provider]  ferrous gluconate (FERGON) 324 MG tablet TAKE 1 TABLET BY MOUTH THREE TIMES DAILY WITH MEALS 07/27/16   [provider]  folic acid (FOLVITE) 1 MG tablet Take 1 mg by mouth daily.    [provider]  gabapentin (NEURONTIN) 300 MG capsule Take 1 capsule (300 mg total) by mouth at bedtime. 05/04/18   Lauraine Rinne, NP  glimepiride (AMARYL) 4 MG tablet Take 2 mg by mouth 2 (two) times daily. Patient takes 2mg  morning, 2mg  afternoon, and 4 mg at bedtime daily. Totals 8mg  daily. 07/31/16   [provider]  ipratropium (ATROVENT) 0.06 % nasal spray U 2 SPRAYS IEN TID 03/11/18   [provider]  levocetirizine (XYZAL) 5 MG tablet Take 5 mg by mouth daily. 03/25/16   [provider]  losartan (COZAAR) 50 MG tablet Take 50 mg by mouth daily.     [provider]  metFORMIN (GLUCOPHAGE) 850 MG tablet Take 1 tablet by mouth 3 (three) times daily. 02/17/17   [provider]  Multiple Vitamin (MULTI-VITAMINS) TABS Take 1 tablet by mouth daily.      [provider]  pantoprazole (PROTONIX) 40 MG tablet Take 40 mg by mouth 2 (two) times daily.     [provider]  pravastatin (PRAVACHOL) 10 MG tablet Take 10 mg by mouth daily.    [provider]  PROAIR RESPICLICK 191 (90 Base) MCG/ACT AEPB INL 2 PFS PO Q 6 H PRN 02/14/18   [provider]  sildenafil (VIAGRA) 100 MG tablet Take 100 mg by mouth daily as needed for erectile dysfunction.    [provider]  STENDRA 200 MG TABS U UTD 03/03/18   [provider]  sucralfate (CARAFATE) 1 g tablet Take 1 g by mouth 2 (two) times daily.    [provider]  SYMBICORT 160-4.5 MCG/ACT inhaler INHALE 2 PUFFS PO BID 03/11/18   [provider]  tiZANidine (ZANAFLEX) 2 MG tablet Take 2 mg by mouth at bedtime as needed for muscle spasms.     [provider]  traMADol (ULTRAM) 50 MG tablet Take 50 mg by mouth every 6 (  six) hours as needed for moderate pain. Reported on 05/22/2016    [provider]  TRULICITY 7.12 WP/8.0DX SOPN INJ 0.75 MG [place] Q 7 DAYS 03/23/18   [provider]  vitamin C (ASCORBIC ACID) 500 MG tablet Take 500 mg by mouth daily.    [provider]    Allergies Ciprofloxacin  Family History  Problem Relation Age of Onset  . Urolithiasis Father   . Kidney disease Father   . Prostate cancer Neg Hx   . Kidney cancer Neg Hx     Social History Social History   Tobacco Use  . Smoking status: Never Smoker  . Smokeless tobacco: Never Used  Substance Use Topics  . Alcohol use: No    Comment: very rare etoh   . Drug use: No    Review of Systems Constitutional: No fever/chills Eyes: No visual changes. ENT: No sore throat. Cardiovascular: Positive for chest pain. Respiratory: Denies shortness of breath. Gastrointestinal: Positive for abdominal pain.  Positive for nausea, no vomiting.  No diarrhea.  No constipation. Genitourinary: Negative for dysuria. Musculoskeletal: Negative for  back pain. Skin: Negative for rash. Neurological: Negative for headaches, focal weakness or numbness.   ____________________________________________   PHYSICAL EXAM:  VITAL SIGNS: ED Triage Vitals [07/30/18 0508]  Enc Vitals Group     BP 137/85     Pulse Rate 80     Resp (!) 22     Temp 98.2 F (36.8 C)     Temp Source Oral     SpO2 100 %     Weight 177 lb (80.3 kg)     Height 5\' 10"  (1.778 m)     Head Circumference      Peak Flow      Pain Score 10     Pain Loc      Pain Edu?      Excl. in [place]?     Constitutional: Alert and oriented x4 holding his lower chest and upper abdomen mildly diaphoretic and appears unwell Eyes: PERRL EOMI. Head: Atraumatic. Nose: No congestion/rhinnorhea. Mouth/Throat: No trismus Neck: No stridor.   Cardiovascular: Regular rate, regular rhythm. Grossly normal heart sounds.  Good peripheral circulation. Respiratory: Increased respiratory effort.  No retractions. Lungs CTAB and moving good air Gastrointestinal: Soft quite tender in the upper abdomen although no frank peritonitis negative Murphy's Musculoskeletal: No lower extremity edema   Neurologic:  Normal speech and language. No gross focal neurologic deficits are appreciated. Skin: Mildly diaphoretic Psychiatric: Mood and affect are normal. Speech and behavior are normal.    ____________________________________________   DIFFERENTIAL includes but not limited to  Dissection, pulmonary embolism, pancreatitis, acute coronary syndrome ____________________________________________   LABS (all labs ordered are listed, but only abnormal results are displayed)  Labs Reviewed  BASIC METABOLIC PANEL - Abnormal; Notable for the following components:      Result Value   Glucose, Bld 232 (*)    BUN 22 (*)    Creatinine, Ser 1.35 (*)    GFR calc non Af Amer 58 (*)    All other components within normal limits  CBC - Abnormal; Notable for the following components:   WBC 11.5 (*)    RDW  15.1 (*)    All other components within normal limits  LIPASE, BLOOD - Abnormal; Notable for the following components:   Lipase 328 (*)    All other components within normal limits  LACTIC ACID, PLASMA - Abnormal; Notable for the following components:   Lactic Acid,  Venous 2.8 (*)    All other components within normal limits  LIPID PANEL - Abnormal; Notable for the following components:   Cholesterol 218 (*)    Triglycerides 314 (*)    HDL 37 (*)    VLDL 63 (*)    LDL Cholesterol 118 (*)    All other components within normal limits  TROPONIN I  HEPATIC FUNCTION PANEL  LACTIC ACID, PLASMA    Lab work reviewed by me with elevated lipase concerning for acute pancreatitis Increased lactic acid likely not secondary to sepsis and more likely secondary to under resuscitation from pancreatitis __________________________________________  EKG  ED ECG REPORT I, Darel Hong, the attending physician, personally viewed and interpreted this ECG.  Date: 07/30/2018 EKG Time:  Rate: 77 Rhythm: normal sinus rhythm QRS Axis: normal Intervals: normal ST/T Wave abnormalities: normal Narrative Interpretation: no evidence of acute ischemia  ____________________________________________  RADIOLOGY  CT angiogram of the chest reviewed by me with no dissection but is concerning for acute pancreatitis ____________________________________________   PROCEDURES  Procedure(s) performed: no  Procedures  Critical Care performed: no  ____________________________________________   INITIAL IMPRESSION / ASSESSMENT AND PLAN / ED COURSE  Pertinent labs & imaging results that were available during my care of the patient were reviewed by me and considered in my medical decision making (see chart for details).   As part of my medical decision making, I reviewed the following data within the [place] History obtained from family if available, nursing notes, old chart and ekg, as  well as notes from prior ED visits.  Patient comes to the emergency department with severe pain in his lower chest/upper abdomen going straight to the back which is concerning for dissection versus intra-abdominal process.  Given his strong family history he was taken straight to CT angiogram which fortunately is negative for dissection but positive for pancreatitis.  His pain is quite severe and after 8 mg of IV morphine and 8 mg of IV Zofran he is still in pain and nauseated.  Unclear etiology of his pancreatitis but could be medication related.  At this point he requires inpatient admission for IV fluids, IV opioids, IV antiemetics, and continued observation.  I spoke with the hospitalist and the patient as well as his wife who all verbalized understanding and agreement with the plan.      ____________________________________________   FINAL CLINICAL IMPRESSION(S) / ED DIAGNOSES  Final diagnoses:  Acute pancreatitis without infection or necrosis, unspecified pancreatitis type      NEW MEDICATIONS STARTED DURING THIS VISIT:  New Prescriptions   No medications on file     Note:  This document was prepared using Dragon voice recognition software and may include unintentional dictation errors.     Darel Hong, MD 07/30/18 [phone]

## 2018-07-30 NOTE — ED Notes (Signed)
Next lactic acid to be drawn at 7:48 since last was drawn at 5:48

## 2018-07-30 NOTE — ED Notes (Signed)
Critical result informed to MD R[place]bark.

## 2018-07-30 NOTE — H&P (Signed)
[place] [facility] [place] NAME: Jesse Alvarado    MR#:  [id number]  DATE OF BIRTH:  [birth year]  DATE OF ADMISSION:  07/30/2018  PRIMARY CARE PHYSICIAN: Glendon Axe, MD   REQUESTING/REFERRING PHYSICIAN: Dr. Christeen Douglas  CHIEF COMPLAINT:   Chief Complaint  Patient presents with  . Chest Pain    HISTORY OF PRESENT ILLNESS:  Jesse Alvarado  is a 52 y.o. male with a known history of diabetes mellitus, hypertension, CKD, hyperlipidemia, sleep apnea and iron deficiency anemia presents to hospital secondary to worsening epigastric pain associated with nausea. Patient has had intermittent epigastric pain with nausea radiating to his back for 2 weeks now.  Denies any fevers or chills.  No vomiting or diarrhea associated with it.  No recent travel.  No history of gallstones, not an alcoholic.  Pain was self resolving up until yesterday.  Woke up with pain that was radiating to the back and got worse overnight that he came to the emergency room.  Started on Trulicity for his diabetes bath 3 months ago.  Dose was increased about the last month.  CT of the abdomen here showing pancreatitis.  Lipase is elevated.  He is being admitted for the same.  PAST MEDICAL HISTORY:   Past Medical History:  Diagnosis Date  . Anemia    taking 3 iron pills a day  . Arthritis   . Asthma    sports induced asthma. takes inhalers when needed  . Cancer (HCC)    Basal cell  . Chronic kidney disease    kidney stones  . Diabetes mellitus without complication ([place])   . Diverticulosis   . Erectile dysfunction   . GERD (gastroesophageal reflux disease)   . Hyperlipidemia   . Hypertension    per patient, he does not have high bp but is treated for his kidneys and his diabetes  . Kidney stone   . Sleep apnea    use C-PAP    PAST SURGICAL HISTORY:   Past Surgical History:  Procedure Laterality Date  . CYSTOSCOPY WITH STENT PLACEMENT Bilateral 06/05/2016   Procedure: CYSTOSCOPY WITH STENT PLACEMENT;  Surgeon: Nickie Retort, MD;  Location: [facility] OR[facility];  Service: Urology;  Laterality: Bilateral;  . CYSTOSCOPY/URETEROSCOPY/HOLMIUM LASER/STENT PLACEMENT Left 04/13/2016   Procedure: CYSTOSCOPY/RETROGRADE PYELOGRAM/URETEROSCOPY WITH HOLMIUM LASER LITHOTRIPSY//STENT PLACEMENT;  Surgeon: Festus Aloe, MD;  Location: [facility];  Service: Urology;  Laterality: Left;  . ESOPHAGOGASTRODUODENOSCOPY (EGD) WITH PROPOFOL N/A 12/27/2015   Procedure: ESOPHAGOGASTRODUODENOSCOPY (EGD) WITH PROPOFOL;  Surgeon: Manya Silvas, MD;  Location: [facility];  Service: Endoscopy;  Laterality: N/A;  . EXTRACORPOREAL SHOCK WAVE LITHOTRIPSY Right 12/30/2017   Procedure: EXTRACORPOREAL SHOCK WAVE LITHOTRIPSY (ESWL);  Surgeon: Royston Cowper, MD;  Location: [facility] ORS;  Service: Urology;  Laterality: Right;  . HERNIA REPAIR  7096   umbilical  . KNEE ARTHROSCOPY Right 2015   had bursa sack repaired  . PREPATELLAR BURSA EXCISION Left 1993   and ostetomy. had at least 5 surgeries in 15 years  . SHOULDER SURGERY Right 2011   tendon was shredded and was trimmed, repaired and reattached. Screws in shoulder  . URETEROSCOPY WITH HOLMIUM LASER LITHOTRIPSY Bilateral 06/05/2016   Procedure: URETEROSCOPY WITH HOLMIUM LASER LITHOTRIPSY;  Surgeon: Nickie Retort, MD;  Location: [facility] OR[facility];  Service: Urology;  Laterality: Bilateral;  . VASECTOMY  2002    SOCIAL HISTORY:   Social History   Tobacco Use  . Smoking status: Never Smoker  .  Smokeless tobacco: Never Used  Substance Use Topics  . Alcohol use: No    Comment: very rare etoh     FAMILY HISTORY:   Family History  Problem Relation Age of Onset  . Urolithiasis Father   . Kidney disease Father   . Prostate cancer Neg Hx   . Kidney cancer Neg Hx     DRUG ALLERGIES:   Allergies  Allergen Reactions  . Ciprofloxacin Other (See Comments)    GI upset    REVIEW OF SYSTEMS:   Review of Systems  Constitutional:  Negative for chills, fever, malaise/fatigue and weight loss.  HENT: Negative for ear discharge, ear pain, hearing loss, nosebleeds and tinnitus.   Eyes: Negative for blurred vision, double vision and photophobia.  Respiratory: Negative for cough, hemoptysis, shortness of breath and wheezing.   Cardiovascular: Negative for chest pain, palpitations, orthopnea and leg swelling.  Gastrointestinal: Positive for abdominal pain and nausea. Negative for constipation, diarrhea, heartburn, melena and vomiting.  Genitourinary: Negative for dysuria, frequency, hematuria and urgency.  Musculoskeletal: Negative for back pain, myalgias and neck pain.  Skin: Negative for rash.  Neurological: Negative for dizziness, tingling, tremors, sensory change, speech change, focal weakness and headaches.  Endo/Heme/Allergies: Does not bruise/bleed easily.  Psychiatric/Behavioral: Negative for depression.    MEDICATIONS AT HOME:   Prior to Admission medications   Medication Sig Start Date End Date Taking? Authorizing Provider  aspirin EC 81 MG EC tablet Take 1 tablet (81 mg total) by mouth daily. 04/10/17   Loletha Grayer, MD  Azelastine-Fluticasone [facility]) 137-50 MCG/ACT SUSP Place into the nose.    [provider]  benzonatate (TESSALON) 200 MG capsule Take 1 capsule (200 mg total) by mouth 3 (three) times daily as needed for cough. 05/04/18   Lauraine Rinne, NP  calcium citrate-vitamin D (CITRACAL+D) 315-200 MG-UNIT tablet Take by mouth.    [provider]  CICLOPIROX EX Apply 1 application topically at bedtime.    [provider]  cyanocobalamin (,VITAMIN B-12,) 1000 MCG/ML injection Inject into the muscle. 03/23/18   [provider]  FARXIGA 5 MG TABS tablet  03/24/18   [provider]  fenofibrate (TRICOR) 145 MG tablet Take 145 mg by mouth daily.  07/16/16   [provider]  ferrous gluconate (FERGON) 324 MG tablet TAKE 1 TABLET BY MOUTH THREE TIMES DAILY WITH  MEALS 07/27/16   [provider]  folic acid (FOLVITE) 1 MG tablet Take 1 mg by mouth daily.    [provider]  gabapentin (NEURONTIN) 300 MG capsule Take 1 capsule (300 mg total) by mouth at bedtime. 05/04/18   Lauraine Rinne, NP  glimepiride (AMARYL) 4 MG tablet Take 2 mg by mouth 2 (two) times daily. Patient takes 2mg  morning, 2mg  afternoon, and 4 mg at bedtime daily. Totals 8mg  daily. 07/31/16   [provider]  ipratropium (ATROVENT) 0.06 % nasal spray U 2 SPRAYS IEN TID 03/11/18   [provider]  levocetirizine (XYZAL) 5 MG tablet Take 5 mg by mouth daily. 03/25/16   [provider]  losartan (COZAAR) 50 MG tablet Take 50 mg by mouth daily.     [provider]  metFORMIN (GLUCOPHAGE) 850 MG tablet Take 1 tablet by mouth 3 (three) times daily. 02/17/17   [provider]  Multiple Vitamin (MULTI-VITAMINS) TABS Take 1 tablet by mouth daily.     [provider]  pantoprazole (PROTONIX) 40 MG tablet Take 40 mg by mouth 2 (two) times daily.  [provider]  pravastatin (PRAVACHOL) 10 MG tablet Take 10 mg by mouth daily.    [provider]  PROAIR RESPICLICK 536 (90 Base) MCG/ACT AEPB INL 2 PFS PO Q 6 H PRN 02/14/18   [provider]  sildenafil (VIAGRA) 100 MG tablet Take 100 mg by mouth daily as needed for erectile dysfunction.    [provider]  STENDRA 200 MG TABS U UTD 03/03/18   [provider]  sucralfate (CARAFATE) 1 g tablet Take 1 g by mouth 2 (two) times daily.    [provider]  SYMBICORT 160-4.5 MCG/ACT inhaler INHALE 2 PUFFS PO BID 03/11/18   [provider]  tiZANidine (ZANAFLEX) 2 MG tablet Take 2 mg by mouth at bedtime as needed for muscle spasms.     [provider]  traMADol (ULTRAM) 50 MG tablet Take 50 mg by mouth every 6 (six) hours as needed for moderate pain. Reported on 05/22/2016    [provider]  TRULICITY 6.44 IH/4.7QQ SOPN  INJ 0.75 MG [place] Q 7 DAYS 03/23/18   [provider]  vitamin C (ASCORBIC ACID) 500 MG tablet Take 500 mg by mouth daily.    [provider]      VITAL SIGNS:  Blood pressure (!) 134/96, pulse 72, temperature 98.2 F (36.8 C), temperature source Oral, resp. rate 19, height 5\' 10"  (1.778 m), weight 80.3 kg, SpO2 99 %.  PHYSICAL EXAMINATION:   Physical Exam  GENERAL:  52 y.o.-year-old patient lying in the bed with no acute distress.  EYES: Pupils equal, round, reactive to light and accommodation. No scleral icterus. Extraocular muscles intact.  HEENT: Head atraumatic, normocephalic. Oropharynx and nasopharynx clear.  NECK:  Supple, no jugular venous distention. No thyroid enlargement, no tenderness.  LUNGS: Normal breath sounds bilaterally, no wheezing, rales,rhonchi or crepitation. No use of accessory muscles of respiration.  CARDIOVASCULAR: S1, S2 normal. No murmurs, rubs, or gallops.  ABDOMEN: Soft, minimal tenderness in the epigastric region, nondistended. Bowel sounds present. No organomegaly or mass.  EXTREMITIES: No pedal edema, cyanosis, or clubbing.  NEUROLOGIC: Cranial nerves II through XII are intact. Muscle strength 5/5 in all extremities. Sensation intact. Gait not checked.  PSYCHIATRIC: The patient is alert and oriented x 3.  SKIN: No obvious rash, lesion, or ulcer.   LABORATORY PANEL:   CBC Recent Labs  Lab 07/30/18 0513  WBC 11.5*  HGB 13.6  HCT 40.6  PLT 368   ------------------------------------------------------------------------------------------------------------------  Chemistries  Recent Labs  Lab 07/30/18 0513  NA 142  K 4.2  CL 110  CO2 23  GLUCOSE 232*  BUN 22*  CREATININE 1.35*  CALCIUM 9.9  AST 23  ALT 23  ALKPHOS 51  BILITOT 0.3   ------------------------------------------------------------------------------------------------------------------  Cardiac Enzymes Recent Labs  Lab 07/30/18 0513  TROPONINI <0.03    ------------------------------------------------------------------------------------------------------------------  RADIOLOGY:  Dg Chest 2 View  Result Date: 07/30/2018 CLINICAL DATA:  52 year old male with chest pain. EXAM: CHEST - 2 VIEW COMPARISON:  Chest CT dated 07/30/2018 FINDINGS: The heart size and mediastinal contours are within normal limits. Both lungs are clear. The visualized skeletal structures are unremarkable. IMPRESSION: No active cardiopulmonary disease. Electronically Signed   By: Anner Crete M.D.   On: 07/30/2018 05:56   Ct Chest High Resolution  Result Date: 07/29/2018 CLINICAL DATA:  Chronic productive cough and dyspnea with exertion. Never smoker. EXAM: CT CHEST WITHOUT CONTRAST TECHNIQUE: Multidetector CT imaging of the chest was performed following the standard protocol without intravenous contrast.  High resolution imaging of the lungs, as well as inspiratory and expiratory imaging, was performed. COMPARISON:  05/04/2018 chest radiograph. 04/09/2017 chest CT angiogram. FINDINGS: Cardiovascular: Normal heart size. No significant pericardial effusion/thickening. Left anterior descending and left circumflex coronary atherosclerosis. Atherosclerotic nonaneurysmal thoracic aorta. Normal caliber pulmonary arteries. Mediastinum/Nodes: No discrete thyroid nodules. Unremarkable esophagus. No pathologically enlarged axillary, mediastinal or hilar lymph nodes, noting limited sensitivity for the detection of hilar adenopathy on this noncontrast study. Lungs/Pleura: No pneumothorax. No pleural effusion. No acute consolidative airspace disease, lung masses or significant pulmonary nodules. Tiny subpleural calcified peripheral right upper lobe granuloma is stable. No significant air trapping on the expiration sequence. No significant regions of subpleural reticulation, ground-glass attenuation, traction bronchiectasis, parenchymal banding, architectural distortion or frank honeycombing.  Upper abdomen: Mild diffuse hepatic steatosis. Nonobstructing 5 mm upper right and 4 mm upper left renal stones. Colonic diverticulosis. Musculoskeletal: No aggressive appearing focal osseous lesions. Mild thoracic spondylosis. IMPRESSION: 1. No evidence of interstitial lung disease. No active pulmonary disease. 2. Two vessel coronary atherosclerosis. 3. Mild diffuse hepatic steatosis. 4. Nonobstructing bilateral nephrolithiasis. Aortic Atherosclerosis (ICD10-I70.0). Electronically Signed   By: Ilona Sorrel M.D.   On: 07/29/2018 11:25   Ct Angio Chest/abd/pel For Dissection W And/or W/wo  Result Date: 07/30/2018 CLINICAL DATA:  52 year old male with chest and back pain. Concern for aortic dissection. EXAM: CT ANGIOGRAPHY CHEST, ABDOMEN AND PELVIS TECHNIQUE: Multidetector CT imaging through the chest, abdomen and pelvis was performed using the standard protocol during bolus administration of intravenous contrast. Multiplanar reconstructed images and MIPs were obtained and reviewed to evaluate the vascular anatomy. CONTRAST:  160mL ISOVUE-370 IOPAMIDOL (ISOVUE-370) INJECTION 76% COMPARISON:  None. FINDINGS: CTA CHEST FINDINGS Cardiovascular: Mildly enlarged appearing right ventricle. No pericardial effusion. The thoracic aorta is unremarkable. The origins of the great vessels of the aortic arch appear patent. The central pulmonary arteries are patent as visualized. Coronary vascular calcification primarily involving the LAD. Mediastinum/Nodes: No hilar or mediastinal adenopathy. Esophagus is grossly unremarkable. No mediastinal fluid collection. Lungs/Pleura: There is a 4 mm right upper lobe subpleural calcified focus, likely a granuloma. The lungs are otherwise clear. There is no pleural effusion or pneumothorax. The central airways are patent. Musculoskeletal: Mild degenerative changes of the spine. No acute osseous pathology. Review of the MIP images confirms the above findings. CTA ABDOMEN AND PELVIS FINDINGS  VASCULAR Aorta: Normal caliber aorta without aneurysm, dissection, vasculitis or significant stenosis. Celiac: Patent without evidence of aneurysm, dissection, vasculitis or significant stenosis. SMA: Patent without evidence of aneurysm, dissection, vasculitis or significant stenosis. Renals: Both renal arteries are patent without evidence of aneurysm, dissection, vasculitis, fibromuscular dysplasia or significant stenosis. IMA: Patent without evidence of aneurysm, dissection, vasculitis or significant stenosis. Inflow: Patent without evidence of aneurysm, dissection, vasculitis or significant stenosis. Veins: No obvious venous abnormality within the limitations of this arterial phase study. Review of the MIP images confirms the above findings. NON-VASCULAR No intra-abdominal free air or free fluid. Hepatobiliary: Diffuse fatty infiltration of the liver. No intrahepatic biliary ductal dilatation. The gallbladder is unremarkable. Pancreas: There is haziness and mild stranding of the fat adjacent to the head and proximal body of the pancreas suggestive of acute pancreatitis. Correlation with pancreatic enzymes recommended. No drainable fluid collection, abscess or pseudocyst. Spleen: Normal in size without focal abnormality. Adrenals/Urinary Tract: The adrenal glands are unremarkable. Multiple small nonobstructing bilateral renal calculi noted measuring up to 3 mm. No hydronephrosis. The visualized ureters and urinary bladder appear unremarkable. Stomach/Bowel: There is colonic diverticulosis without  active inflammatory changes. Moderate stool noted throughout the colon. There is no bowel obstruction or active inflammation. Normal appendix. Lymphatic: No adenopathy. Reproductive: Mild enlargement of the prostate gland. The seminal vesicles are symmetric. Other: None Musculoskeletal: Bilateral L5 pars defects with grade 1 L5-S1 anterolisthesis. No acute osseous pathology. Review of the MIP images confirms the above  findings. IMPRESSION: 1. No aortic aneurysm or dissection. No CT evidence of central pulmonary artery embolism. 2. Mild inflammatory changes of the proximal pancreas concerning for acute pancreatitis. Clinical correlation is recommended. Electronically Signed   By: Anner Crete M.D.   On: 07/30/2018 05:51   Ct Maxillofacial Wo Contrast  Result Date: 07/29/2018 CLINICAL DATA:  Chronic postnasal drip and facial pressure EXAM: CT MAXILLOFACIAL WITHOUT CONTRAST TECHNIQUE: Multidetector CT imaging of the maxillofacial structures was performed. Multiplanar CT image reconstructions were also generated. COMPARISON:  Plain films 09/24/2017 FINDINGS: Osseous: No fracture or mandibular dislocation. No destructive process. Orbits: Negative. No traumatic or inflammatory finding. Sinuses: Rounded soft tissue noted along the medial right maxillary sinus and in the floor of the left maxillary sinus, left greater than right. Central calcifications in the left maxillary rounded soft tissue. Remainder the paranasal sinuses are clear. No air-fluid levels. Mastoid air cells clear. Soft tissues: Negative Limited intracranial: Negative IMPRESSION: Rounded mucosal thickening in the maxillary sinuses bilaterally, left greater than right, likely mucous retention cysts. Remainder the paranasal sinuses are clear. Electronically Signed   By: Rolm Baptise M.D.   On: 07/29/2018 09:52    EKG:   Orders placed or performed during the hospital encounter of 07/30/18  . EKG 12-Lead  . EKG 12-Lead  . ED EKG within 10 minutes  . ED EKG within 10 minutes    IMPRESSION AND PLAN:   Jesse Alvarado  is a 52 y.o. male with a known history of diabetes mellitus, hypertension, CKD, hyperlipidemia, sleep apnea and iron deficiency anemia presents to hospital secondary to worsening epigastric pain associated with nausea.  1.  Acute pancreatitis-likely drug-induced. -No history of alcohol use, CT negative for any gallbladder stones. -Started  on Trulicity which is a GLP-1 receptor agonist, can cause pancreatitis.  Dose was recently increased.  Will discontinue Trulicity. -Keep n.p.o., IV fluids and monitor. -Check lipase in a.m. -Lipid panel ordered to check triglycerides  2.  Diabetes mellitus with hyperglycemia-check A1c.  Hold Trulicity.  Based on sugars will start Lantus insulin sliding scale.  3.  GERD-on Protonix and sucralfate  4.  Asthma-following with the pulmonologist as outpatient.  Continue home inhalers.  5.  History of iron deficiency anemia-hemoglobin is stable this admission.  Continue to monitor and continue iron supplements.  6.  DVT prophylaxis-Lovenox    All the records are reviewed and case discussed with ED provider. Management plans discussed with the patient, family and they are in agreement.  CODE STATUS: Full code  TOTAL TIME TAKING CARE OF THIS PATIENT: 50 minutes.    Gladstone Lighter M.D on 07/30/2018 at 7:46 AM  Between 7am to 6pm - Pager - [phone]  After 6pm go to [email or web address] - password [email or web address] [facility]  Office  [phone]  CC: Primary care physician; Glendon Axe, MD

## 2018-07-30 NOTE — ED Triage Notes (Signed)
Pt states epigastric pain that began yesterday that has worsened since 0100. Pt states he feels shob. Pt appears in distress.

## 2018-07-30 NOTE — ED Notes (Signed)
Charge rn notified of need for bed. md notified regarding pt's chief complaint. Order for ct chest received.

## 2018-07-31 LAB — BASIC METABOLIC PANEL
ANION GAP: 4 — AB (ref 5–15)
BUN: 15 mg/dL (ref 6–20)
CO2: 26 mmol/L (ref 22–32)
Calcium: 8.1 mg/dL — ABNORMAL LOW (ref 8.9–10.3)
Chloride: 109 mmol/L (ref 98–111)
Creatinine, Ser: 1.01 mg/dL (ref 0.61–1.24)
GFR calc Af Amer: >60 mL/min (ref >60)
GFR calc non Af Amer: >60 mL/min (ref >60)
GLUCOSE: 124 mg/dL — AB (ref 70–99)
POTASSIUM: 4 mmol/L (ref 3.5–5.1)
Sodium: 139 mmol/L (ref 135–145)

## 2018-07-31 LAB — LIPASE, BLOOD: LIPASE: 49 U/L (ref 11–51)

## 2018-07-31 LAB — CBC
HEMATOCRIT: 34.6 % — AB (ref 40.0–52.0)
HEMOGLOBIN: 11.7 g/dL — AB (ref 13.0–18.0)
MCH: 29.8 pg (ref 26.0–34.0)
MCHC: 33.7 g/dL (ref 32.0–36.0)
MCV: 88.3 fL (ref 80.0–100.0)
Platelets: 268 10*3/uL (ref 150–440)
RBC: 3.92 MIL/uL — ABNORMAL LOW (ref 4.40–5.90)
RDW: 15.1 % — ABNORMAL HIGH (ref 11.5–14.5)
WBC: 7.3 10*3/uL (ref 3.8–10.6)

## 2018-07-31 LAB — GLUCOSE, CAPILLARY
GLUCOSE-CAPILLARY: 169 mg/dL — AB (ref 70–99)
Glucose-Capillary: 103 mg/dL — ABNORMAL HIGH (ref 70–99)
Glucose-Capillary: 83 mg/dL (ref 70–99)

## 2018-07-31 MED ORDER — ONDANSETRON HCL 4 MG PO TABS: 4.0000 mg | ORAL_TABLET | Freq: Every day | ORAL | 1 refills | Status: DC | PRN

## 2018-07-31 MED ORDER — TRAMADOL HCL 50 MG PO TABS: 50.0000 mg | ORAL_TABLET | Freq: Four times a day (QID) | ORAL | 0 refills | Status: DC | PRN

## 2018-07-31 MED ORDER — PRAVASTATIN SODIUM 40 MG PO TABS: 40.0000 mg | ORAL_TABLET | Freq: Every day | ORAL | 2 refills | Status: DC

## 2018-07-31 NOTE — Care Management (Signed)
No discharge needs identified by members of the care team 

## 2018-07-31 NOTE — Discharge Summary (Addendum)
[facility] [place] NAME: Jesse Alvarado    MR#:  [id number]  DATE OF BIRTH:  1966-07-28  DATE OF ADMISSION:  07/30/2018   ADMITTING PHYSICIAN: Gladstone Lighter, MD  DATE OF DISCHARGE: 07/31/18  PRIMARY CARE PHYSICIAN: Glendon Axe, MD   ADMISSION DIAGNOSIS:   Acute pancreatitis without infection or necrosis, unspecified pancreatitis type [K85.90]  DISCHARGE DIAGNOSIS:   Active Problems:   Acute pancreatitis   SECONDARY DIAGNOSIS:   Past Medical History:  Diagnosis Date  . Anemia    taking 3 iron pills a day  . Arthritis   . Asthma    sports induced asthma. takes inhalers when needed  . Cancer (HCC)    Basal cell  . Chronic kidney disease    kidney stones  . Diabetes mellitus without complication ([place])   . Diverticulosis   . Erectile dysfunction   . GERD (gastroesophageal reflux disease)   . Hyperlipidemia   . Hypertension    per patient, he does not have high bp but is treated for his kidneys and his diabetes  . Kidney stone   . Sleep apnea    use C-PAP    HOSPITAL COURSE:   Jesse Alvarado  is a 52 y.o. male with a known history of diabetes mellitus, hypertension, CKD, hyperlipidemia, sleep apnea and iron deficiency anemia presents to hospital secondary to worsening epigastric pain associated with nausea.  1.  Acute pancreatitis-likely drug-induced- by trulicity. -No history of alcohol use, CT negative for any gallbladder stones. -Started on Trulicity which is a GLP-1 receptor agonist, can cause pancreatitis.  Dose was recently increased.  Will discontinue Trulicity. -Symptoms improved with symptomatic treatment with fluids and bowel rest. -Lipase has improved-tolerating regular diet  2.  Diabetes mellitus with hyperglycemia-hypoglycemic episode yesterday while he was n.p.o.  A1c is at 7.4.  Discontinue Trulicity at discharge. -Patient already on Amaryl, metformin and Farxiga  3.  GERD-on Protonix and  sucralfate  4.  Asthma-following with the pulmonologist as outpatient.  Continue home inhalers.  5.  History of iron deficiency anemia-hemoglobin is stable this admission.  Continue to monitor and continue iron supplements.  6.  Hyperlipidemia, hypertriglyceridemia.  Triglycerides and 300 range.  Increasing the dose of pravachol  Stable otherwise.  Will be discharged home today  DISCHARGE CONDITIONS:   Guarded  CONSULTS OBTAINED:   None  DRUG ALLERGIES:   Allergies  Allergen Reactions  . Ciprofloxacin Other (See Comments)    GI upset   DISCHARGE MEDICATIONS:   Allergies as of 07/31/2018      Reactions   Ciprofloxacin Other (See Comments)   GI upset      Medication List    STOP taking these medications   TRULICITY 1.5 GQ/9.1QX Sopn Generic drug:  Dulaglutide     TAKE these medications   aspirin 81 MG EC tablet Take 1 tablet (81 mg total) by mouth daily.   benzonatate 200 MG capsule Commonly known as:  TESSALON Take 1 capsule (200 mg total) by mouth 3 (three) times daily as needed for cough.   calcium citrate-vitamin D 315-200 MG-UNIT tablet Commonly known as:  CITRACAL+D Take 1 tablet by mouth daily.   cyanocobalamin 1000 MCG/ML injection Commonly known as:  (VITAMIN B-12) Inject 1,000 mcg into the muscle every 30 (thirty) days.   DYMISTA 137-50 MCG/ACT Susp Generic drug:  Azelastine-Fluticasone Place 1 spray into the nose 2 (two) times daily.   FARXIGA 5 MG Tabs tablet Generic drug:  dapagliflozin propanediol Take 5 mg by mouth daily.   fenofibrate 145 MG tablet Commonly known as:  TRICOR Take 145 mg by mouth daily.   ferrous gluconate 324 MG tablet Commonly known as:  FERGON TAKE 1 TABLET BY MOUTH THREE TIMES DAILY WITH MEALS   folic acid 1 MG tablet Commonly known as:  FOLVITE Take 1 mg by mouth daily.   gabapentin 300 MG capsule Commonly known as:  NEURONTIN Take 1 capsule (300 mg total) by mouth at bedtime.   glimepiride 4 MG  tablet Commonly known as:  AMARYL Take 2 mg by mouth 2 (two) times daily. Patient takes 2mg  morning, 2mg  afternoon, and 4 mg at bedtime daily. Totals 8mg  daily.   ipratropium 0.06 % nasal spray Commonly known as:  ATROVENT Place 2 sprays into both nostrils 3 (three) times daily.   levocetirizine 5 MG tablet Commonly known as:  XYZAL Take 5 mg by mouth daily.   losartan 50 MG tablet Commonly known as:  COZAAR Take 50 mg by mouth daily.   meloxicam 15 MG tablet Commonly known as:  MOBIC Take 1 tablet by mouth daily.   metFORMIN 850 MG tablet Commonly known as:  GLUCOPHAGE Take 1 tablet by mouth 3 (three) times daily.   MULTI-VITAMINS Tabs Take 1 tablet by mouth daily.   ondansetron 4 MG tablet Commonly known as:  ZOFRAN Take 1 tablet (4 mg total) by mouth daily as needed for nausea or vomiting.   pantoprazole 40 MG tablet Commonly known as:  PROTONIX Take 40 mg by mouth 2 (two) times daily.   pravastatin 40 MG tablet Commonly known as:  PRAVACHOL Take 1 tablet (40 mg total) by mouth daily. What changed:    medication strength  how much to take   PROAIR RESPICLICK 607 (90 Base) MCG/ACT Aepb Generic drug:  Albuterol Sulfate Inhale 2 puffs into the lungs every 6 (six) hours as needed.   sildenafil 100 MG tablet Commonly known as:  VIAGRA Take 100 mg by mouth daily as needed for erectile dysfunction.   STENDRA 200 MG Tabs Generic drug:  Avanafil Take 1 tablet by mouth daily.   sucralfate 1 g tablet Commonly known as:  CARAFATE Take 1 g by mouth 4 (four) times daily.   SYMBICORT 160-4.5 MCG/ACT inhaler Generic drug:  budesonide-formoterol Inhale 2 puffs into the lungs 2 (two) times daily.   tiZANidine 2 MG tablet Commonly known as:  ZANAFLEX Take 2 mg by mouth at bedtime as needed for muscle spasms.   traMADol 50 MG tablet Commonly known as:  ULTRAM Take 1 tablet (50 mg total) by mouth every 6 (six) hours as needed.   vitamin C 500 MG tablet Commonly  known as:  ASCORBIC ACID Take 500 mg by mouth daily.        DISCHARGE INSTRUCTIONS:   1. PCP f/u in less than a week  DIET:   Cardiac diet and Diabetic diet  ACTIVITY:   Activity as tolerated  OXYGEN:   Home Oxygen: No.  Oxygen Delivery: room air  DISCHARGE LOCATION:   home   If you experience worsening of your admission symptoms, develop shortness of breath, life threatening emergency, suicidal or homicidal thoughts you must seek medical attention immediately by calling 911 or calling your MD immediately  if symptoms less severe.  You Must read complete instructions/literature along with all the possible adverse reactions/side effects for all the Medicines you take and that have been prescribed to you. Take any new Medicines after  you have completely understood and accpet all the possible adverse reactions/side effects.   Please note  You were cared for by a hospitalist during your hospital stay. If you have any questions about your discharge medications or the care you received while you were in the hospital after you are discharged, you can call the unit and asked to speak with the hospitalist on call if the hospitalist that took care of you is not available. Once you are discharged, your primary care physician will handle any further medical issues. Please note that NO REFILLS for any discharge medications will be authorized once you are discharged, as it is imperative that you return to your primary care physician (or establish a relationship with a primary care physician if you do not have one) for your aftercare needs so that they can reassess your need for medications and monitor your lab values.    On the day of Discharge:  VITAL SIGNS:   Blood pressure 125/79, pulse 61, temperature 97.9 F (36.6 C), temperature source Oral, resp. rate 18, height 5\' 10"  (1.778 m), weight 80.3 kg, SpO2 98 %.  PHYSICAL EXAMINATION:    GENERAL:  52 y.o.-year-old patient lying in  the bed with no acute distress.  EYES: Pupils equal, round, reactive to light and accommodation. No scleral icterus. Extraocular muscles intact.  HEENT: Head atraumatic, normocephalic. Oropharynx and nasopharynx clear.  NECK:  Supple, no jugular venous distention. No thyroid enlargement, no tenderness.  LUNGS: Normal breath sounds bilaterally, no wheezing, rales,rhonchi or crepitation. No use of accessory muscles of respiration.  CARDIOVASCULAR: S1, S2 normal. No murmurs, rubs, or gallops.  ABDOMEN: Soft, minimal tenderness in the epigastric region, nondistended. Bowel sounds present. No organomegaly or mass.  EXTREMITIES: No pedal edema, cyanosis, or clubbing.  NEUROLOGIC: Cranial nerves II through XII are intact. Muscle strength 5/5 in all extremities. Sensation intact. Gait not checked.  PSYCHIATRIC: The patient is alert and oriented x 3.  SKIN: No obvious rash, lesion, or ulcer.   DATA REVIEW:   CBC Recent Labs  Lab 07/31/18 0348  WBC 7.3  HGB 11.7*  HCT 34.6*  PLT 268    Chemistries  Recent Labs  Lab 07/30/18 0513 07/31/18 0348  NA 142 139  K 4.2 4.0  CL 110 109  CO2 23 26  GLUCOSE 232* 124*  BUN 22* 15  CREATININE 1.35* 1.01  CALCIUM 9.9 8.1*  AST 23  --   ALT 23  --   ALKPHOS 51  --   BILITOT 0.3  --      Microbiology Results  Results for orders placed or performed in visit on 10/08/16  Urine culture     Status: Abnormal   Collection Time: 10/08/16  4:25 PM  Result Value Ref Range Status   Specimen Description URINE, CLEAN CATCH  Final   Special Requests NONE  Final   Culture (A)  Final    <10,000 COLONIES/mL INSIGNIFICANT GROWTH Performed at [facility]    Report Status 10/09/2016 FINAL  Final    RADIOLOGY:  No results found.   Management plans discussed with the patient, family and they are in agreement.  CODE STATUS:     Code Status Orders  (From admission, onward)         Start     Ordered   07/30/18 0826  Full code   Continuous     07/30/18 0826        Code Status History    Date Active Date  Inactive Code Status Order ID Comments User Context   04/08/2017 1347 04/09/2017 1942 Full Code [id number]  Bettey Costa, MD Inpatient   04/13/2016 0742 04/14/2016 1548 Full Code [id number]  Harrie Foreman, MD Inpatient      TOTAL TIME TAKING CARE OF THIS PATIENT: 38 minutes.    Joannah Gitlin M.D on 07/31/2018 at 1:50 PM  Between 7am to 6pm - Pager - [phone]  After 6pm go to [email or web address] - Proofreader  [facility]  Office  [phone]  CC: Primary care physician; Glendon Axe, MD   Note: This dictation was prepared with Dragon dictation along with smaller phrase technology. Any transcriptional errors that result from this process are unintentional.

## 2018-07-31 NOTE — Progress Notes (Signed)
Pt A&Ox4, VS stable. Pain stable. Tolerated clear liquids throughout the night w/o complications. IV fluids continued, will continue to monitor.

## 2018-07-31 NOTE — Progress Notes (Signed)
     Jesse Alvarado was admitted to the [facility] on 07/30/2018 for an acute medical condition and is being Discharged on  07/31/2018 . He will need another 3 days for recovery and so advised to stay away from work until then. So please excuse him  from work for the above  Days. Should be able to return to work if feeling well without any restrictions from  08/03/18.  Call Gladstone Lighter  MD, [facility] at  [phone] with questions.  Gladstone Lighter M.D on 07/31/2018,at 12:14 PM  [facility] [address], [place] [address]

## 2018-08-01 LAB — GLUCOSE, CAPILLARY: Glucose-Capillary: 90 mg/dL (ref 70–99)

## 2018-08-01 LAB — HIV ANTIBODY (ROUTINE TESTING W REFLEX): HIV SCREEN 4TH GENERATION: NONREACTIVE

## 2018-08-03 ENCOUNTER — Telehealth: Payer: Self-pay

## 2018-08-03 NOTE — Telephone Encounter (Signed)
EMMI Follow-up: Noted on the report that the patient didn't know who to contact if there was a change in condition.  I talked with Jesse Alvarado and he had been to see his PCP so no needs noted for today.  I let him know there would be a second automated call with a different series of questions and to let us know at that time if he had any concerns.

## 2018-08-05 ENCOUNTER — Telehealth: Payer: Self-pay | Admitting: Internal Medicine

## 2018-08-05 ENCOUNTER — Telehealth: Payer: Self-pay | Admitting: Cardiology

## 2018-08-05 DIAGNOSIS — I2510 Atherosclerotic heart disease of native coronary artery without angina pectoris: Secondary | ICD-10-CM

## 2018-08-05 DIAGNOSIS — R05 Cough: Secondary | ICD-10-CM

## 2018-08-05 DIAGNOSIS — R059 Cough, unspecified: Secondary | ICD-10-CM

## 2018-08-05 NOTE — Telephone Encounter (Signed)
Will forward to [employer]

## 2018-08-05 NOTE — Telephone Encounter (Signed)
Mr. Empey's father in law Rennis Chris) is a patient of Dr. Percival Spanish. I am unsure what this patient is needing. If he needs a new patient appointment, will route to scheduling to address.

## 2018-08-05 NOTE — Telephone Encounter (Signed)
Called patient unable to reach left message to give us a call back.

## 2018-08-05 NOTE — Telephone Encounter (Signed)
Left message for patient to call.

## 2018-08-05 NOTE — Telephone Encounter (Signed)
Spoke with pt. He is requesting his CT results from 07/29/18.  MR - please advise. Thanks.

## 2018-08-05 NOTE — Telephone Encounter (Signed)
1. There is something called mucu retention cyst in one of the sinuses on right - can be giving blocked nose sensation  2. CT chest - no cancer, no fibrosis, no emphysema, no lung diseae - > so no explanation four cough. So refer [facility] - DR Wanita Chamberlain for #1 and #2 issues  3. There is  does have coronary artery calcification - refer cards though given fairly good pulmnary stress test in June 2019 and normal trop 07/30/18 risk of heart vessel block is low but still good idea to see cards  3. Kidney stones without obstruction - talk to PCP Glendon Axe, MD    SIGNATURE    Dr. Brand Males, M.D., F.C.C.P,  Pulmonary and Critical Care Medicine Staff Physician, [facility] Director - Interstitial Lung Disease  Program  Pulmonary [place] [facility] [place], [place], [address]  Pager: [phone], If no answer or between  15:00h - 7:00h: call [phone] Telephone: [phone]  5:14 PM 08/05/2018     IMPRESSION: Rounded mucosal thickening in the maxillary sinuses bilaterally, left greater than right, likely mucous retention cysts. Remainder the paranasal sinuses are clear.   Electronically Signed   By: Rolm Baptise M.D.   On: 07/29/2018 09:52 IMPRESSION: 1. No evidence of interstitial lung disease. No active pulmonary disease. 2. Two vessel coronary atherosclerosis. 3. Mild diffuse hepatic steatosis. 4. Nonobstructing bilateral nephrolithiasis.  Aortic Atherosclerosis (ICD10-I70.0).   Electronically Signed   By: Ilona Sorrel M.D.   On: 07/29/2018 11:25

## 2018-08-05 NOTE — Telephone Encounter (Signed)
New message   Patient states that he was advised by Dr. Debara Pickett that he would not need a referral to see him. Patient states that he is the son-in-law of Rennis Chris. He states that his insurance does not require a referral.

## 2018-08-08 NOTE — Telephone Encounter (Signed)
Attempted to call pt. I did not receive an answer. I have left a message for pt to return our call.  

## 2018-08-10 NOTE — Telephone Encounter (Signed)
Attempted to call pt. I did not receive an answer. I have left a message for pt to return our call.  

## 2018-08-10 NOTE — Telephone Encounter (Signed)
Spoke with pt and notified of results per Dr. Chase Caller. Pt verbalized understanding and denied any questions. I have placed referral  He states he has been dealing with kidney stones for years and will continue to f/u with PCP about this

## 2018-08-10 NOTE — Telephone Encounter (Signed)
Pt is calling back [phone]

## 2018-08-11 ENCOUNTER — Telehealth: Payer: Self-pay | Admitting: Internal Medicine

## 2018-08-16 NOTE — Telephone Encounter (Signed)
Encounter opened in error. Will close

## 2018-08-18 ENCOUNTER — Telehealth: Payer: Self-pay | Admitting: Internal Medicine

## 2018-08-18 NOTE — Telephone Encounter (Signed)
LM on pt's VM to return my call.  Need to provide appt info for Dr. Addison Bailey for 08/30/18 @ 3:40.

## 2018-08-19 ENCOUNTER — Encounter: Payer: Self-pay | Admitting: Internal Medicine

## 2018-08-19 ENCOUNTER — Telehealth: Payer: Self-pay | Admitting: Internal Medicine

## 2018-08-19 NOTE — Telephone Encounter (Signed)
Attempted to call pt to see if he had received the needed appt info for Dr. Joya Gaskins but unable to reach him.  Left message for pt to return call x2. Routing to [place], [facility]

## 2018-08-19 NOTE — Telephone Encounter (Signed)
After several attempts at reaching the patient, I have mailed him a letter with the referral appointment info to the patient.  Nothing further needed at this time.

## 2018-08-19 NOTE — Telephone Encounter (Signed)
Called and spoke to patient, patient states he had already spoken with someone last week about his referral and already has appointment and location information. Nothing further is needed at this time. Informed patient to disregard letter that was sent.

## 2018-08-30 NOTE — Progress Notes (Deleted)
Cardiology Office Note   Date:  08/30/2018   ID:  Jesse Alvarado, DOB [birth year], MRN [id number]  PCP:  Glendon Axe, MD  Cardiologist:   No primary care provider on file. Referring:  ***  No chief complaint on file.     History of Present Illness: Jesse Alvarado is a 52 y.o. male who presents for ***    He did have a recent CPX which I reviewed.  This was for ***.  There were no significant abnormalities in it suggestive deconditioning.  Past Medical History:  Diagnosis Date  . Anemia    taking 3 iron pills a day  . Arthritis   . Asthma    sports induced asthma. takes inhalers when needed  . Cancer (HCC)    Basal cell  . Chronic kidney disease    kidney stones  . Diabetes mellitus without complication ([place])   . Diverticulosis   . Erectile dysfunction   . GERD (gastroesophageal reflux disease)   . Hyperlipidemia   . Hypertension    per patient, he does not have high bp but is treated for his kidneys and his diabetes  . Kidney stone   . Sleep apnea    use C-PAP    Past Surgical History:  Procedure Laterality Date  . CYSTOSCOPY WITH STENT PLACEMENT Bilateral 06/05/2016   Procedure: CYSTOSCOPY WITH STENT PLACEMENT;  Surgeon: Nickie Retort, MD;  Location: [facility] ORS;  Service: Urology;  Laterality: Bilateral;  . CYSTOSCOPY/URETEROSCOPY/HOLMIUM LASER/STENT PLACEMENT Left 04/13/2016   Procedure: CYSTOSCOPY/RETROGRADE PYELOGRAM/URETEROSCOPY WITH HOLMIUM LASER LITHOTRIPSY//STENT PLACEMENT;  Surgeon: Festus Aloe, MD;  Location: [facility] ORS;  Service: Urology;  Laterality: Left;  . ESOPHAGOGASTRODUODENOSCOPY (EGD) WITH PROPOFOL N/A 12/27/2015   Procedure: ESOPHAGOGASTRODUODENOSCOPY (EGD) WITH PROPOFOL;  Surgeon: Manya Silvas, MD;  Location: [facility] ENDOSCOPY;  Service: Endoscopy;  Laterality: N/A;  . EXTRACORPOREAL SHOCK WAVE LITHOTRIPSY Right 12/30/2017   Procedure: EXTRACORPOREAL SHOCK WAVE LITHOTRIPSY (ESWL);  Surgeon: Royston Cowper, MD;  Location: [facility] ORS;   Service: Urology;  Laterality: Right;  . HERNIA REPAIR  9381   umbilical  . KNEE ARTHROSCOPY Right 2015   had bursa sack repaired  . PREPATELLAR BURSA EXCISION Left 1993   and ostetomy. had at least 5 surgeries in 15 years  . SHOULDER SURGERY Right 2011   tendon was shredded and was trimmed, repaired and reattached. Screws in shoulder  . URETEROSCOPY WITH HOLMIUM LASER LITHOTRIPSY Bilateral 06/05/2016   Procedure: URETEROSCOPY WITH HOLMIUM LASER LITHOTRIPSY;  Surgeon: Nickie Retort, MD;  Location: [facility] ORS;  Service: Urology;  Laterality: Bilateral;  . VASECTOMY  2002     Current Outpatient Medications  Medication Sig Dispense Refill  . aspirin EC 81 MG EC tablet Take 1 tablet (81 mg total) by mouth daily. 30 tablet 0  . Azelastine-Fluticasone (DYMISTA) 137-50 MCG/ACT SUSP Place 1 spray into the nose 2 (two) times daily.     . benzonatate (TESSALON) 200 MG capsule Take 1 capsule (200 mg total) by mouth 3 (three) times daily as needed for cough. 20 capsule 4  . calcium citrate-vitamin D (CITRACAL+D) 315-200 MG-UNIT tablet Take 1 tablet by mouth daily.     . cyanocobalamin (,VITAMIN B-12,) 1000 MCG/ML injection Inject 1,000 mcg into the muscle every 30 (thirty) days.     Marland Kitchen FARXIGA 5 MG TABS tablet Take 5 mg by mouth daily.   2  . fenofibrate (TRICOR) 145 MG tablet Take 145 mg by mouth daily.     . ferrous  gluconate (FERGON) 324 MG tablet TAKE 1 TABLET BY MOUTH THREE TIMES DAILY WITH MEALS    . folic acid (FOLVITE) 1 MG tablet Take 1 mg by mouth daily.    Marland Kitchen gabapentin (NEURONTIN) 300 MG capsule Take 1 capsule (300 mg total) by mouth at bedtime. 30 capsule 3  . glimepiride (AMARYL) 4 MG tablet Take 2 mg by mouth 2 (two) times daily. Patient takes 2mg  morning, 2mg  afternoon, and 4 mg at bedtime daily. Totals 8mg  daily.    Marland Kitchen ipratropium (ATROVENT) 0.06 % nasal spray Place 2 sprays into both nostrils 3 (three) times daily.   3  . levocetirizine (XYZAL) 5 MG tablet Take 5 mg by mouth daily.   11  . losartan (COZAAR) 50 MG tablet Take 50 mg by mouth daily.     . meloxicam (MOBIC) 15 MG tablet Take 1 tablet by mouth daily.  3  . metFORMIN (GLUCOPHAGE) 850 MG tablet Take 1 tablet by mouth 3 (three) times daily.    . Multiple Vitamin (MULTI-VITAMINS) TABS Take 1 tablet by mouth daily.     . ondansetron (ZOFRAN) 4 MG tablet Take 1 tablet (4 mg total) by mouth daily as needed for nausea or vomiting. 20 tablet 1  . pantoprazole (PROTONIX) 40 MG tablet Take 40 mg by mouth 2 (two) times daily.     . pravastatin (PRAVACHOL) 40 MG tablet Take 1 tablet (40 mg total) by mouth daily. 30 tablet 2  . PROAIR RESPICLICK 191 (90 Base) MCG/ACT AEPB Inhale 2 puffs into the lungs every 6 (six) hours as needed.   0  . sildenafil (VIAGRA) 100 MG tablet Take 100 mg by mouth daily as needed for erectile dysfunction.    Marland Kitchen STENDRA 200 MG TABS Take 1 tablet by mouth daily.   6  . sucralfate (CARAFATE) 1 g tablet Take 1 g by mouth 4 (four) times daily.     . SYMBICORT 160-4.5 MCG/ACT inhaler Inhale 2 puffs into the lungs 2 (two) times daily.   3  . tiZANidine (ZANAFLEX) 2 MG tablet Take 2 mg by mouth at bedtime as needed for muscle spasms.     . traMADol (ULTRAM) 50 MG tablet Take 1 tablet (50 mg total) by mouth every 6 (six) hours as needed. 20 tablet 0  . vitamin C (ASCORBIC ACID) 500 MG tablet Take 500 mg by mouth daily.     No current facility-administered medications for this visit.     Allergies:   Ciprofloxacin    Social History:  The patient  reports that he has never smoked. He has never used smokeless tobacco. He reports that he does not drink alcohol or use drugs.   Family History:  The patient's ***family history includes Kidney disease in his father; Urolithiasis in his father.    ROS:  Please see the history of present illness.   Otherwise, review of systems are positive for {NONE DEFAULTED:18576::"none"}.   All other systems are reviewed and negative.    PHYSICAL EXAM: VS:  There were  no vitals taken for this visit. , BMI There is no height or weight on file to calculate BMI. GENERAL:  Well appearing HEENT:  Pupils equal round and reactive, fundi not visualized, oral mucosa unremarkable NECK:  No jugular venous distention, waveform within normal limits, carotid upstroke brisk and symmetric, no bruits, no thyromegaly LYMPHATICS:  No cervical, inguinal adenopathy LUNGS:  Clear to auscultation bilaterally BACK:  No CVA tenderness CHEST:  Unremarkable HEART:  PMI not displaced  or sustained,S1 and S2 within normal limits, no S3, no S4, no clicks, no rubs, *** murmurs ABD:  Flat, positive bowel sounds normal in frequency in pitch, no bruits, no rebound, no guarding, no midline pulsatile mass, no hepatomegaly, no splenomegaly EXT:  2 plus pulses throughout, no edema, no cyanosis no clubbing SKIN:  No rashes no nodules NEURO:  Cranial nerves II through XII grossly intact, motor grossly intact throughout PSYCH:  Cognitively intact, oriented to person place and time    EKG:  EKG {ACTION; IS/IS ZOX:[id number]} ordered today. The ekg ordered today demonstrates ***   Recent Labs: 07/30/2018: ALT 23 07/31/2018: BUN 15; Creatinine, Ser 1.01; Hemoglobin 11.7; Platelets 268; Potassium 4.0; Sodium 139    Lipid Panel    Component Value Date/Time   CHOL 218 (H) 07/30/2018 0513   TRIG 314 (H) 07/30/2018 0513   HDL 37 (L) 07/30/2018 0513   CHOLHDL 5.9 07/30/2018 0513   VLDL 63 (H) 07/30/2018 0513   LDLCALC 118 (H) 07/30/2018 0513      Wt Readings from Last 3 Encounters:  07/30/18 177 lb (80.3 kg)  07/15/18 179 lb 9.6 oz (81.5 kg)  05/04/18 179 lb 3.2 oz (81.3 kg)      Other studies Reviewed: Additional studies/ records that were reviewed today include: ***. Review of the above records demonstrates:  Please see elsewhere in the note.  ***   ASSESSMENT AND PLAN:  ***   Current medicines are reviewed at length with the patient today.  The patient {ACTIONS; HAS/DOES NOT  HAVE:19233} concerns regarding medicines.  The following changes have been made:  {PLAN; NO CHANGE:13088:s}  Labs/ tests ordered today include: *** No orders of the defined types were placed in this encounter.    Disposition:   FU with ***    Signed, Minus Breeding, MD  08/30/2018 1:10 PM    [facility]

## 2018-08-30 NOTE — Progress Notes (Signed)
Cardiology Office Note   Date:  08/31/2018   ID:  Jesse Alvarado, DOB Apr 06, 1966, MRN [id number]  PCP:  Glendon Axe, MD  Cardiologist:   No primary care provider on file. Referring:  Glendon Axe, MD  Chief Complaint  Patient presents with  . Coronary Calcium      History of Present Illness: Jesse Alvarado is a 52 y.o. male who presents for evaluation of coronary calcium.  This was found recently on a CTA.  He is been having an evaluation for cough and dyspnea.  Of note he did have chest pain last year and I was able to obtain a result of a stress test which demonstrated no evidence of ischemia.  This was a perfusion study.  This was done at [facility].  For evaluation of his cough and dyspnea and questionable exercise-induced asthma he did have a recent CPX which I reviewed. There were no significant abnormalities in it suggestive deconditioning.   He says that for a while he is been having a cough that is been deep and nonproductive.  It happens when he comes in working outside and he goes to the temperature change.  He can do activities such as pushing power and be aggressively active in the yard and not bring on chest pressure, neck or arm discomfort.  He does not notice any palpitations, presyncope or syncope.  Is not describing PND or orthopnea.  I did review the results of the CT which demonstrated some calcification in his LAD and circumflex territory.  Past Medical History:  Diagnosis Date  . Anemia    taking 3 iron pills a day  . Arthritis   . Asthma    sports induced asthma. takes inhalers when needed  . Cancer (HCC)    Basal cell  . Chronic kidney disease    kidney stones  . Diabetes mellitus without complication ([place])   . Diverticulosis   . Erectile dysfunction   . GERD (gastroesophageal reflux disease)   . Hyperlipidemia   . Hypertension    per patient, he does not have high bp but is treated for his kidneys and his diabetes  . Kidney stone   .  Sleep apnea    use C-PAP    Past Surgical History:  Procedure Laterality Date  . CYSTOSCOPY WITH STENT PLACEMENT Bilateral 06/05/2016   Procedure: CYSTOSCOPY WITH STENT PLACEMENT;  Surgeon: Nickie Retort, MD;  Location: [facility] ORS;  Service: Urology;  Laterality: Bilateral;  . CYSTOSCOPY/URETEROSCOPY/HOLMIUM LASER/STENT PLACEMENT Left 04/13/2016   Procedure: CYSTOSCOPY/RETROGRADE PYELOGRAM/URETEROSCOPY WITH HOLMIUM LASER LITHOTRIPSY//STENT PLACEMENT;  Surgeon: Festus Aloe, MD;  Location: [facility] ORS;  Service: Urology;  Laterality: Left;  . ESOPHAGOGASTRODUODENOSCOPY (EGD) WITH PROPOFOL N/A 12/27/2015   Procedure: ESOPHAGOGASTRODUODENOSCOPY (EGD) WITH PROPOFOL;  Surgeon: Manya Silvas, MD;  Location: [facility] ENDOSCOPY;  Service: Endoscopy;  Laterality: N/A;  . EXTRACORPOREAL SHOCK WAVE LITHOTRIPSY Right 12/30/2017   Procedure: EXTRACORPOREAL SHOCK WAVE LITHOTRIPSY (ESWL);  Surgeon: Royston Cowper, MD;  Location: [facility] ORS;  Service: Urology;  Laterality: Right;  . HERNIA REPAIR  7829   umbilical  . KNEE ARTHROSCOPY Right 2015   had bursa sack repaired  . PREPATELLAR BURSA EXCISION Left 1993   and ostetomy. had at least 5 surgeries in 15 years  . SHOULDER SURGERY Right 2011   tendon was shredded and was trimmed, repaired and reattached. Screws in shoulder  . URETEROSCOPY WITH HOLMIUM LASER LITHOTRIPSY Bilateral 06/05/2016   Procedure: URETEROSCOPY WITH HOLMIUM LASER LITHOTRIPSY;  Surgeon:  Nickie Retort, MD;  Location: [facility] ORS;  Service: Urology;  Laterality: Bilateral;  . VASECTOMY  2002     Current Outpatient Medications  Medication Sig Dispense Refill  . aspirin EC 81 MG EC tablet Take 1 tablet (81 mg total) by mouth daily. 30 tablet 0  . Azelastine-Fluticasone (DYMISTA) 137-50 MCG/ACT SUSP Place 1 spray into the nose 2 (two) times daily.     . benzonatate (TESSALON) 200 MG capsule Take 1 capsule (200 mg total) by mouth 3 (three) times daily as needed for cough. 20 capsule 4    . calcium citrate-vitamin D (CITRACAL+D) 315-200 MG-UNIT tablet Take 1 tablet by mouth daily.     . cyanocobalamin (,VITAMIN B-12,) 1000 MCG/ML injection Inject 1,000 mcg into the muscle every 30 (thirty) days.     Marland Kitchen FARXIGA 5 MG TABS tablet Take 5 mg by mouth daily.   2  . fenofibrate (TRICOR) 145 MG tablet Take 145 mg by mouth daily.     . ferrous gluconate (FERGON) 324 MG tablet TAKE 1 TABLET BY MOUTH THREE TIMES DAILY WITH MEALS    . folic acid (FOLVITE) 1 MG tablet Take 1 mg by mouth daily.    Marland Kitchen glimepiride (AMARYL) 4 MG tablet Take 2 mg by mouth 2 (two) times daily. Patient takes 2mg  morning, 2mg  afternoon, and 4 mg at bedtime daily. Totals 8mg  daily.    Marland Kitchen ipratropium (ATROVENT) 0.06 % nasal spray Place 2 sprays into both nostrils 3 (three) times daily.   3  . levocetirizine (XYZAL) 5 MG tablet Take 5 mg by mouth daily.  11  . losartan (COZAAR) 50 MG tablet Take 50 mg by mouth daily.     . meloxicam (MOBIC) 15 MG tablet Take 1 tablet by mouth as directed.   3  . metFORMIN (GLUCOPHAGE) 850 MG tablet Take 1 tablet by mouth 3 (three) times daily.    . Multiple Vitamin (MULTI-VITAMINS) TABS Take 1 tablet by mouth daily.     . pantoprazole (PROTONIX) 40 MG tablet Take 40 mg by mouth 2 (two) times daily.     . pioglitazone (ACTOS) 15 MG tablet Take 15 mg by mouth daily.    . pravastatin (PRAVACHOL) 40 MG tablet Take 1 tablet (40 mg total) by mouth daily. 30 tablet 2  . PROAIR RESPICLICK 086 (90 Base) MCG/ACT AEPB Inhale 2 puffs into the lungs every 6 (six) hours as needed.   0  . sildenafil (VIAGRA) 100 MG tablet Take 100 mg by mouth daily as needed for erectile dysfunction.    Marland Kitchen STENDRA 200 MG TABS Take 1 tablet by mouth daily.   6  . sucralfate (CARAFATE) 1 g tablet Take 1 g by mouth 4 (four) times daily.     . SYMBICORT 160-4.5 MCG/ACT inhaler Inhale 2 puffs into the lungs 2 (two) times daily.   3  . tiZANidine (ZANAFLEX) 2 MG tablet Take 2 mg by mouth at bedtime as needed for muscle  spasms.     . traMADol (ULTRAM) 50 MG tablet Take 1 tablet (50 mg total) by mouth every 6 (six) hours as needed. 20 tablet 0  . vitamin C (ASCORBIC ACID) 500 MG tablet Take 500 mg by mouth daily.    Marland Kitchen gabapentin (NEURONTIN) 100 MG capsule Take 1 capsule by mouth 3 (three) times daily.  10   No current facility-administered medications for this visit.     Allergies:   Ciprofloxacin    Social History:  The patient  reports  that he has never smoked. He has never used smokeless tobacco. He reports that he does not drink alcohol or use drugs.   Family History:  The patient's family history includes Kidney disease in his father; Urolithiasis in his father.    ROS:  Please see the history of present illness.   Otherwise, review of systems are positive for none.   All other systems are reviewed and negative.    PHYSICAL EXAM: VS:  BP 118/82   Pulse 90   Ht 5\' 10"  (1.778 m)   Wt 179 lb (81.2 kg)   SpO2 97%   BMI 25.68 kg/m  , BMI Body mass index is 25.68 kg/m. GENERAL:  Well appearing HEENT:  Pupils equal round and reactive, fundi not visualized, oral mucosa unremarkable NECK:  No jugular venous distention, waveform within normal limits, carotid upstroke brisk and symmetric, no bruits, no thyromegaly LYMPHATICS:  No cervical, inguinal adenopathy LUNGS:  Clear to auscultation bilaterally BACK:  No CVA tenderness CHEST:  Unremarkable HEART:  PMI not displaced or sustained,S1 and S2 within normal limits, no S3, no S4, no clicks, no rubs, no murmurs ABD:  Flat, positive bowel sounds normal in frequency in pitch, no bruits, no rebound, no guarding, no midline pulsatile mass, no hepatomegaly, no splenomegaly EXT:  2 plus pulses throughout, no edema, no cyanosis no clubbing SKIN:  No rashes no nodules NEURO:  Cranial nerves II through XII grossly intact, motor grossly intact throughout PSYCH:  Cognitively intact, oriented to person place and time    EKG:  EKG is not ordered today. The  ekg ordered today demonstrates sinus rhythm, rate 77, axis within normal limits, intervals within normal limits, no acute ST-T wave changes.   Recent Labs: 07/30/2018: ALT 23 07/31/2018: BUN 15; Creatinine, Ser 1.01; Hemoglobin 11.7; Platelets 268; Potassium 4.0; Sodium 139    Lipid Panel    Component Value Date/Time   CHOL 218 (H) 07/30/2018 0513   TRIG 314 (H) 07/30/2018 0513   HDL 37 (L) 07/30/2018 0513   CHOLHDL 5.9 07/30/2018 0513   VLDL 63 (H) 07/30/2018 0513   LDLCALC 118 (H) 07/30/2018 0513      Wt Readings from Last 3 Encounters:  08/31/18 179 lb (81.2 kg)  07/30/18 177 lb (80.3 kg)  07/15/18 179 lb 9.6 oz (81.5 kg)      Other studies Reviewed: Additional studies/ records that were reviewed today include: CT, CPX, Lexiscan Myoview, pulmonary notes. Review of the above records demonstrates:  Please see elsewhere in the note.     ASSESSMENT AND PLAN:  CORONARY CALCIUM: He has had a negative Lexiscan Myoview and no evidence of ischemia on his cardiopulmonary stress test either.  He has no active chest pain symptoms.  I think his coronary calcium is a marker of future cardiovascular risk and he needs intensive risk reduction but no further testing at this point.  He had a long conversation about this.  DYSLIPIDEMIA: He was recently started on pravastatin at a higher dose 40 mg and he had been taking but then this was reduced back to 10 mg.  I reviewed his lipids and his LDL is 118.  I think the goal should be less than 70 and for now we will increase his pravastatin to 40 mg daily and get a lipid profile in 10 weeks.  His triglycerides are not controlled.  He needs management with diabetes controlled on diet we discussed this at length.  DSYLIPIDEMIA: Of note he had pancreatitis on Trulicity.  He is thinking of seeing an endocrinologist and I encouraged this.  I do note his most recent hemoglobin A1c was 7.4 which is better than he states previous.  We talked about diet and  exercise.   Current medicines are reviewed at length with the patient today.  The patient does not have concerns regarding medicines.  The following changes have been made:  As above  Labs/ tests ordered today include:   Orders Placed This Encounter  Procedures  . Lipid panel     Disposition:   FU with me in one year.     Signed, Minus Breeding, MD  08/31/2018 11:17 AM    [place]

## 2018-08-31 ENCOUNTER — Ambulatory Visit: Payer: Managed Care, Other (non HMO) | Admitting: Cardiology

## 2018-08-31 ENCOUNTER — Encounter: Payer: Self-pay | Admitting: Cardiology

## 2018-08-31 ENCOUNTER — Ambulatory Visit (INDEPENDENT_AMBULATORY_CARE_PROVIDER_SITE_OTHER): Payer: Managed Care, Other (non HMO) | Admitting: Cardiology

## 2018-08-31 VITALS — BP 118/82 | HR 90 | Ht 70.0 in | Wt 179.0 lb

## 2018-08-31 DIAGNOSIS — E785 Hyperlipidemia, unspecified: Secondary | ICD-10-CM

## 2018-08-31 DIAGNOSIS — R931 Abnormal findings on diagnostic imaging of heart and coronary circulation: Secondary | ICD-10-CM | POA: Diagnosis not present

## 2018-08-31 NOTE — Patient Instructions (Addendum)
Medication Instructions:  RESTART- Pravastatin  If you need a refill on your cardiac medications before your next appointment, please call your pharmacy.  Labwork: Fasting Lipids in 10 weeks HERE IN OUR OFFICE AT [facility]  Take the provided lab slips with you to the lab for your blood draw.   You will need to fast. DO NOT EAT OR DRINK PAST MIDNIGHT.    If you have labs (blood work) drawn today and your tests are completely normal, you will receive your results only by: Marland Kitchen MyChart Message (if you have MyChart) OR . A paper copy in the mail If you have any lab test that is abnormal or we need to change your treatment, we will call you to review the results.  Testing/Procedures: None Ordered  Follow-Up: You will need a follow up appointment in 1 Year.  Please call our office 2 months in [phone]) to schedule the appointment.  You may see  DR Percival Spanish or one of the following Advanced Practice Providers on your designated Care Team:   . Jory Sims, DNP, ANP . Rhonda Barrett, PA-C .  Marland Kitchen Kerin Ransom, PA-C . Daleen Snook Kroeger, PA-C . Sande Rives, PA-C .  Marland Kitchen Almyra Deforest, PA-C . Fabian Sharp, PA-C  At [facility], you and your health needs are our priority.  As part of our continuing mission to provide you with exceptional heart care, we have created designated Provider Care Teams.  These Care Teams include your primary Cardiologist (physician) and Advanced Practice Providers (APPs -  Physician Assistants and Nurse Practitioners) who all work together to provide you with the care you need, when you need it.   Thank you for choosing [facility] at [facility]!!      Dr. Delrae Rend  Endocrinologist  [place]  [phone]

## 2018-09-19 ENCOUNTER — Other Ambulatory Visit: Payer: Managed Care, Other (non HMO)

## 2018-09-19 ENCOUNTER — Telehealth: Payer: Self-pay | Admitting: Interventional Cardiology

## 2018-09-22 ENCOUNTER — Other Ambulatory Visit: Payer: Self-pay | Admitting: Specialist

## 2018-09-26 ENCOUNTER — Other Ambulatory Visit: Payer: Self-pay

## 2018-09-26 ENCOUNTER — Encounter
Admission: RE | Admit: 2018-09-26 | Discharge: 2018-09-26 | Disposition: A | Discharge status: Home / Self Care | Payer: Managed Care, Other (non HMO) | Source: Ambulatory Visit | Attending: Specialist | Admitting: Specialist

## 2018-09-26 DIAGNOSIS — Z01812 Encounter for preprocedural laboratory examination: Secondary | ICD-10-CM | POA: Diagnosis not present

## 2018-09-26 HISTORY — DX: Other complications of anesthesia, initial encounter: T88.59XA

## 2018-09-26 HISTORY — DX: Acute pancreatitis without necrosis or infection, unspecified: K85.90

## 2018-09-26 HISTORY — DX: Other specified cough: R05.8

## 2018-09-26 HISTORY — DX: Adverse effect of unspecified anesthetic, initial encounter: T41.45XA

## 2018-09-26 HISTORY — DX: Fatty (change of) liver, not elsewhere classified: K76.0

## 2018-09-26 HISTORY — DX: Personal history of urinary calculi: Z87.442

## 2018-09-26 HISTORY — DX: Cough: R05

## 2018-09-26 MED ORDER — CHLORHEXIDINE GLUCONATE CLOTH 2 % EX PADS
6.0000 | MEDICATED_PAD | Freq: Once | CUTANEOUS | Status: DC
  Filled 2018-09-26: qty 6

## 2018-09-26 NOTE — Patient Instructions (Addendum)
Your procedure is scheduled on:  Report to Same Day Surgery 2nd floor medical mall [facility] Entrance-take elevator on left to 2nd floor.  Check in with surgery information desk.) To find out your arrival time please call [phone] between 1PM - 3PM on   Remember: Instructions that are not followed completely may result in serious medical risk, up to and including death, or upon the discretion of your surgeon and anesthesiologist your surgery may need to be rescheduled.    _x___ 1. Do not eat food after midnight the night before your procedure. You may drink clear liquids up to 2 hours before you are scheduled to arrive at the hospital for your procedure.  Do not drink clear liquids within 2 hours of your scheduled arrival to the hospital.  Clear liquids include  --Water or Apple juice without pulp  --Clear carbohydrate beverage such as ClearFast or Gatorade  --Black Coffee or Clear Tea (No milk, no creamers, do not add anything to                  the coffee or Tea Type 1 and type 2 diabetics should only drink water.   ____Ensure clear carbohydrate drink on the way to the hospital for bariatric patients  ____Ensure clear carbohydrate drink 3 hours before surgery for Dr Dwyane Luo patients if physician instructed.   No gum chewing or hard candies.     __x__ 2. No Alcohol for 24 hours before or after surgery.   __x__3. No Smoking or e-cigarettes for 24 prior to surgery.  Do not use any chewable tobacco products for at least 6 hour prior to surgery   ____  4. Bring all medications with you on the day of surgery if instructed.    __x__ 5. Notify your doctor if there is any change in your medical condition     (cold, fever, infections).    x___6. On the morning of surgery brush your teeth with toothpaste and water.  You may rinse your mouth with mouth wash if you wish.  Do not swallow any toothpaste or mouthwash.   Do not wear jewelry, make-up, hairpins, clips or nail  polish.  Do not wear lotions, powders, or perfumes. You may wear deodorant.  Do not shave 48 hours prior to surgery. Men may shave face and neck.  Do not bring valuables to the hospital.    [facility] is not responsible for any belongings or valuables.               Contacts, dentures or bridgework may not be worn into surgery.  Leave your suitcase in the car. After surgery it may be brought to your room.  For patients admitted to the hospital, discharge time is determined by your                       treatment team.  _  Patients discharged the day of surgery will not be allowed to drive home.  You will need someone to drive you home and stay with you the night of your procedure.    Please read over the following fact sheets that you were given:   [facility] Preparing for Surgery and or MRSA Information   _x___ Take anti-hypertensive listed below, cardiac, seizure, asthma,     anti-reflux and psychiatric medicines. These include:  1. Azelastine-Fluticasone (DYMISTA) 137-50 MCG/ACT SUSP  2.fenofibrate (TRICOR) 145 MG tablet  3.gabapentin (NEURONTIN) 100 MG capsule  4.ipratropium (ATROVENT) 0.06 %  nasal spray  5.pantoprazole (PROTONIX) 40 MG tablet  6.SYMBICORT 160-4.5 MCG/ACT inhaler  ____Fleets enema or Magnesium Citrate as directed.   _x___ Use CHG Soap or sage wipes as directed on instruction sheet   ____ Use inhalers on the day of surgery and bring to hospital day of surgery  ___x_ Stop Metformin and Janumet 2 days prior to surgery.    ____ Take 1/2 of usual insulin dose the night before surgery and none on the morning     surgery.   _x___ Follow recommendations from Cardiologist, Pulmonologist or PCP regarding          stopping Aspirin, Coumadin, Plavix ,Eliquis, Effient, or Pradaxa, and Pletal.  X____Stop Anti-inflammatories such as Advil, Aleve, Ibuprofen, Motrin, Naproxen, Naprosyn, Goodies powders or aspirin products. OK to take Tylenol and                           Celebrex.   _x___ Stop supplements until after surgery.  But may continue Vitamin D, Vitamin B,       and multivitamin.   _x___ Bring C-Pap to the hospital.

## 2018-09-26 NOTE — Pre-Procedure Instructions (Signed)
Medical clearance on chart. 

## 2018-10-02 MED ORDER — CLINDAMYCIN PHOSPHATE 600 MG/50ML IV SOLN
600.0000 mg | INTRAVENOUS | Status: AC
  Administered 2018-10-03: 600 mg via INTRAVENOUS

## 2018-10-02 MED ORDER — CEFAZOLIN SODIUM-DEXTROSE 2-4 GM/100ML-% IV SOLN
2.0000 g | INTRAVENOUS | Status: AC
  Administered 2018-10-03: 2 g via INTRAVENOUS

## 2018-10-03 ENCOUNTER — Ambulatory Visit: Payer: Managed Care, Other (non HMO) | Admitting: Anesthesiology

## 2018-10-03 ENCOUNTER — Other Ambulatory Visit: Payer: Self-pay

## 2018-10-03 ENCOUNTER — Encounter
Admission: RE | Disposition: A | Discharge status: Home / Self Care | Payer: Self-pay | Source: Ambulatory Visit | Attending: Specialist

## 2018-10-03 ENCOUNTER — Encounter: Payer: Self-pay | Admitting: *Deleted

## 2018-10-03 ENCOUNTER — Ambulatory Visit
Admission: RE | Admit: 2018-10-03 | Discharge: 2018-10-03 | Disposition: A | Discharge status: Home / Self Care | Payer: Managed Care, Other (non HMO) | Source: Ambulatory Visit | Attending: Specialist | Admitting: Specialist

## 2018-10-03 DIAGNOSIS — G5601 Carpal tunnel syndrome, right upper limb: Secondary | ICD-10-CM | POA: Insufficient documentation

## 2018-10-03 HISTORY — PX: CARPAL TUNNEL RELEASE: SHX101

## 2018-10-03 LAB — GLUCOSE, CAPILLARY
GLUCOSE-CAPILLARY: 101 mg/dL — AB (ref 70–99)
Glucose-Capillary: 110 mg/dL — ABNORMAL HIGH (ref 70–99)

## 2018-10-03 SURGERY — CARPAL TUNNEL RELEASE
Anesthesia: General | Laterality: Right

## 2018-10-03 MED ORDER — PROPOFOL 10 MG/ML IV BOLUS
INTRAVENOUS | Status: DC | PRN
  Administered 2018-10-03: 170 mg via INTRAVENOUS

## 2018-10-03 MED ORDER — GABAPENTIN 400 MG PO CAPS: 400.0000 mg | ORAL_CAPSULE | Freq: Three times a day (TID) | ORAL | 3 refills | Status: DC

## 2018-10-03 MED ORDER — LACTATED RINGERS IV SOLN
INTRAVENOUS | Status: DC | PRN
  Administered 2018-10-03 (×2): via INTRAVENOUS

## 2018-10-03 MED ORDER — ACETAMINOPHEN 10 MG/ML IV SOLN
INTRAVENOUS | Status: DC | PRN
  Administered 2018-10-03: 1000 mg via INTRAVENOUS

## 2018-10-03 MED ORDER — FENTANYL CITRATE (PF) 100 MCG/2ML IJ SOLN
INTRAMUSCULAR | Status: AC
  Filled 2018-10-03: qty 2

## 2018-10-03 MED ORDER — MIDAZOLAM HCL 2 MG/2ML IJ SOLN
INTRAMUSCULAR | Status: DC | PRN
  Administered 2018-10-03: 2 mg via INTRAVENOUS

## 2018-10-03 MED ORDER — CEFAZOLIN SODIUM-DEXTROSE 2-4 GM/100ML-% IV SOLN
INTRAVENOUS | Status: AC
  Filled 2018-10-03: qty 100

## 2018-10-03 MED ORDER — HYDROCODONE-ACETAMINOPHEN 5-325 MG PO TABS: 1.0000 | ORAL_TABLET | Freq: Four times a day (QID) | ORAL | 0 refills | Status: DC | PRN

## 2018-10-03 MED ORDER — PHENYLEPHRINE HCL 10 MG/ML IJ SOLN
INTRAMUSCULAR | Status: DC | PRN
  Administered 2018-10-03: 100 ug via INTRAVENOUS

## 2018-10-03 MED ORDER — SODIUM CHLORIDE 0.9 % IV SOLN
INTRAVENOUS | Status: DC
  Administered 2018-10-03: 20 mL/h via INTRAVENOUS

## 2018-10-03 MED ORDER — LIDOCAINE HCL (CARDIAC) PF 100 MG/5ML IV SOSY
PREFILLED_SYRINGE | INTRAVENOUS | Status: DC | PRN
  Administered 2018-10-03: 100 mg via INTRAVENOUS

## 2018-10-03 MED ORDER — PROMETHAZINE HCL 25 MG/ML IJ SOLN: 6.2500 mg | INTRAMUSCULAR | Status: DC | PRN

## 2018-10-03 MED ORDER — BUPIVACAINE HCL (PF) 0.5 % IJ SOLN
INTRAMUSCULAR | Status: AC
  Filled 2018-10-03: qty 30

## 2018-10-03 MED ORDER — ACETAMINOPHEN 10 MG/ML IV SOLN
INTRAVENOUS | Status: AC
  Filled 2018-10-03: qty 100

## 2018-10-03 MED ORDER — FENTANYL CITRATE (PF) 100 MCG/2ML IJ SOLN: 25.0000 ug | INTRAMUSCULAR | Status: DC | PRN

## 2018-10-03 MED ORDER — MELOXICAM 15 MG PO TABS: 15.0000 mg | ORAL_TABLET | Freq: Every day | ORAL | 3 refills | Status: DC

## 2018-10-03 MED ORDER — FENTANYL CITRATE (PF) 100 MCG/2ML IJ SOLN
INTRAMUSCULAR | Status: DC | PRN
  Administered 2018-10-03 (×2): 50 ug via INTRAVENOUS

## 2018-10-03 MED ORDER — MIDAZOLAM HCL 2 MG/2ML IJ SOLN
INTRAMUSCULAR | Status: AC
  Filled 2018-10-03: qty 2

## 2018-10-03 MED ORDER — ONDANSETRON HCL 4 MG/2ML IJ SOLN
INTRAMUSCULAR | Status: DC | PRN
  Administered 2018-10-03: 4 mg via INTRAVENOUS

## 2018-10-03 MED ORDER — MELOXICAM 7.5 MG PO TABS
15.0000 mg | ORAL_TABLET | ORAL | Status: AC
  Administered 2018-10-03: 15 mg via ORAL

## 2018-10-03 MED ORDER — MELOXICAM 7.5 MG PO TABS
ORAL_TABLET | ORAL | Status: AC
  Administered 2018-10-03: 15 mg via ORAL
  Filled 2018-10-03: qty 1

## 2018-10-03 MED ORDER — LACTATED RINGERS IV SOLN: INTRAVENOUS | Status: DC

## 2018-10-03 MED ORDER — PROPOFOL 10 MG/ML IV BOLUS
INTRAVENOUS | Status: AC
  Filled 2018-10-03: qty 20

## 2018-10-03 MED ORDER — BUPIVACAINE HCL 0.5 % IJ SOLN
INTRAMUSCULAR | Status: DC | PRN
  Administered 2018-10-03: 15 mL

## 2018-10-03 MED ORDER — CLINDAMYCIN PHOSPHATE 600 MG/50ML IV SOLN
INTRAVENOUS | Status: AC
  Filled 2018-10-03: qty 50

## 2018-10-03 MED ORDER — DEXAMETHASONE SODIUM PHOSPHATE 10 MG/ML IJ SOLN
INTRAMUSCULAR | Status: DC | PRN
  Administered 2018-10-03: 10 mg via INTRAVENOUS

## 2018-10-03 MED ORDER — GABAPENTIN 300 MG PO CAPS: 300.0000 mg | ORAL_CAPSULE | ORAL | Status: DC

## 2018-10-03 SURGICAL SUPPLY — 27 items
BLADE SURG MINI STRL (BLADE) ×3 IMPLANT
BNDG ESMARK 4X12 TAN STRL LF (GAUZE/BANDAGES/DRESSINGS) ×3 IMPLANT
CANISTER SUCT 1200ML W/VALVE (MISCELLANEOUS) ×3 IMPLANT
CHLORAPREP W/TINT 26ML (MISCELLANEOUS) ×3 IMPLANT
COVER WAND RF STERILE (DRAPES) ×3 IMPLANT
CUFF TOURN 18 STER (MISCELLANEOUS) IMPLANT
DRSG GAUZE FLUFF 36X18 (GAUZE/BANDAGES/DRESSINGS) ×3 IMPLANT
ELECT REM PT RETURN 9FT ADLT (ELECTROSURGICAL) ×3
ELECTRODE REM PT RTRN 9FT ADLT (ELECTROSURGICAL) ×1 IMPLANT
GAUZE PETRO XEROFOAM 1X8 (MISCELLANEOUS) ×3 IMPLANT
GLOVE BIO SURGEON STRL SZ8 (GLOVE) ×3 IMPLANT
GOWN STRL REUS W/ TWL LRG LVL3 (GOWN DISPOSABLE) ×1 IMPLANT
GOWN STRL REUS W/TWL LRG LVL3 (GOWN DISPOSABLE) ×2
GOWN STRL REUS W/TWL LRG LVL4 (GOWN DISPOSABLE) ×3 IMPLANT
KIT TURNOVER KIT A (KITS) ×3 IMPLANT
NS IRRIG 500ML POUR BTL (IV SOLUTION) ×3 IMPLANT
PACK EXTREMITY ARMC (MISCELLANEOUS) ×3 IMPLANT
PAD PREP 24X41 OB/GYN DISP (PERSONAL CARE ITEMS) ×3 IMPLANT
PADDING CAST 4IN STRL (MISCELLANEOUS) ×2
PADDING CAST BLEND 4X4 STRL (MISCELLANEOUS) ×1 IMPLANT
SPLINT CAST 1 STEP 3X12 (MISCELLANEOUS) ×3 IMPLANT
STOCKINETTE BIAS CUT 4 980044 (GAUZE/BANDAGES/DRESSINGS) ×3 IMPLANT
STOCKINETTE STRL 4IN 9604848 (GAUZE/BANDAGES/DRESSINGS) ×3 IMPLANT
SUT ETHILON 4-0 (SUTURE) ×2
SUT ETHILON 4-0 FS2 18XMFL BLK (SUTURE) ×1
SUT ETHILON 5-0 FS-2 18 BLK (SUTURE) ×3 IMPLANT
SUTURE ETHLN 4-0 FS2 18XMF BLK (SUTURE) ×1 IMPLANT

## 2018-10-03 NOTE — Op Note (Signed)
10/03/2018  11:41 AM  PATIENT:  Jesse Alvarado DIAGNOSIS: RIGHT CARPAL TUNNEL SYNDROME POST-OPERATIVE DIAGNOSIS: RIGHT CARPAL TUNNEL SYNDROME  PROCEDURE:  RIGHT CARPAL TUNNEL RELEASE  SURGEON: Park Breed, MD    ANESTHESIA:   General  TOURNIQUET TIME: 21   MIN  PREOPERATIVE INDICATIONS:  [name] is a  52 y.o. male with a diagnosis of right carpal tunnel syndrome who failed conservative measures and elected for surgical management.    The risks benefits and alternatives were discussed with the patient preoperatively including but not limited to the risks of infection, bleeding, nerve injury, incomplete relief of symptoms, pillar pain, cardiopulmonary complications, the need for revision surgery, among others, and the patient was willing to proceed.  OPERATIVE FINDINGS: Thickened volar ligament and nerve compression.  OPERATIVE PROCEDURE: The patient is brought to the operating room placed in the supine position. General anesthesia was administered. The right upper extremity was prepped and draped in usual sterile fashion. Time out was performed. The arm was elevated and exsanguinated and the tourniquet was inflated. Incision was made in line with the radial border of the ring finger. The carpal tunnel transverse fascia was identified, cleaned, and incised sharply. The common sensory branches were visualized along with the superficial palmar arch and protected.  The median nerve was protected below  A Kelly clamp was  placed underneath the transverse carpal ligament, protecting the nerve. I released the ligament completely, and then released the proximal distal volar forearm fascia. The nerve was identified, and visualized, and protected throughout the case. The motor branch was intact upon inspection.  No masses or abnormalities were identified in ulnar bursa.  The wounds were irrigated copiously, and the skin closed with nylon. The wound was injected with 1/2%  marcaine followed by a sterile dressing and  volar splint .  Tourniquet was deflated with good return of blood flow to all fingers. Sponge and needle counts were correct.  The patient tolerated this well, with no complications. The patient was awakened and taken to recovery in good condition.

## 2018-10-03 NOTE — Anesthesia Postprocedure Evaluation (Signed)
Anesthesia Post Note  Patient: Jesse Alvarado  Procedure(s) Performed: CARPAL TUNNEL RELEASE (Right )  Patient location during evaluation: PACU Anesthesia Type: General Level of consciousness: awake and alert Pain management: pain level controlled Vital Signs Assessment: post-procedure vital signs reviewed and stable Respiratory status: spontaneous breathing, nonlabored ventilation, respiratory function stable and patient connected to nasal cannula oxygen Cardiovascular status: blood pressure returned to baseline and stable Postop Assessment: no apparent nausea or vomiting Anesthetic complications: no     Last Vitals:  Vitals:   10/03/18 1250 10/03/18 1313  BP: 115/70 112/70  Pulse: (!) 57 71  Resp: 16   Temp: (!) 35.8 C   SpO2: 97% 99%    Last Pain:  Vitals:   10/03/18 1313  TempSrc:   PainSc: 0-No pain                 Martha Clan

## 2018-10-03 NOTE — Anesthesia Preprocedure Evaluation (Signed)
Anesthesia Evaluation    History of Anesthesia Complications (+) history of anesthetic complications  Airway Mallampati: II       Dental  (+) Poor Dentition, Caps, Dental Advidsory Given, Missing   Pulmonary neg shortness of breath, asthma , sleep apnea and Continuous Positive Airway Pressure Ventilation , neg COPD, neg recent URI,           Cardiovascular Exercise Tolerance: Good hypertension, On Medications (-) angina(-) CAD, (-) Past MI, (-) Cardiac Stents and (-) CABG (-) dysrhythmias (-) Valvular Problems/Murmurs Rhythm:regular Rate:Normal     Neuro/Psych negative neurological ROS     GI/Hepatic GERD  Medicated,NAFLD   Endo/Other  diabetes  Renal/GU Renal disease (kidney stones)     Musculoskeletal   Abdominal   Peds  Hematology  (+) Blood dyscrasia, anemia ,   Anesthesia Other Findings Past Medical History: No date: Anemia     Comment:  taking 3 iron pills a day No date: Anemia No date: Arthritis No date: Asthma     Comment:  sports induced asthma. takes inhalers when needed No date: Cancer [facility])     Comment:  Basal cell No date: Chronic kidney disease     Comment:  kidney stones No date: Complication of anesthesia     Comment:  high tolerance to pain medication. body absorbs pain med              quickly No date: Cough on exercise No date: Diabetes mellitus without complication (HCC) No date: Diverticulosis No date: Erectile dysfunction No date: Fatty liver No date: GERD (gastroesophageal reflux disease) No date: History of kidney stones No date: Hyperlipidemia No date: Hypertension     Comment:  per patient, he does not have high bp but is treated for              his kidneys and his diabetes No date: Kidney stone No date: Pancreatitis No date: Sleep apnea     Comment:  use C-PAP   Reproductive/Obstetrics                             Anesthesia Physical  Anesthesia  Plan  ASA: III  Anesthesia Plan: General LMA   Post-op Pain Management:    Induction: Intravenous  PONV Risk Score and Plan: 2 and Ondansetron, Dexamethasone, Treatment may vary due to age or medical condition and Midazolam  Airway Management Planned: LMA  Additional Equipment:   Intra-op Plan:   Post-operative Plan: Extubation in OR  Informed Consent: I have reviewed the patients History and Physical, chart, labs and discussed the procedure including the risks, benefits and alternatives for the proposed anesthesia with the patient or authorized representative who has indicated his/her understanding and acceptance.     Plan Discussed with:   Anesthesia Plan Comments:         Anesthesia Quick Evaluation

## 2018-10-03 NOTE — Transfer of Care (Signed)
Immediate Anesthesia Transfer of Care Note  Patient: Jesse Alvarado  Procedure(s) Performed: CARPAL TUNNEL RELEASE (Right )  Patient Location: PACU  Anesthesia Type:General  Level of Consciousness: sedated  Airway & Oxygen Therapy: Patient Spontanous Breathing and Patient connected to face mask oxygen  Post-op Assessment: Report given to RN and Post -op Vital signs reviewed and stable  Post vital signs: Reviewed and stable  Last Vitals:  Vitals Value Taken Time  BP 103/68 10/03/2018 11:48 AM  Temp    Pulse 74 10/03/2018 11:48 AM  Resp 15 10/03/2018 11:48 AM  SpO2 100 % 10/03/2018 11:48 AM  Vitals shown include unvalidated device data.  Last Pain:  Vitals:   10/03/18 0856  TempSrc: Oral  PainSc: 0-No pain         Complications: No apparent anesthesia complications

## 2018-10-03 NOTE — Discharge Instructions (Signed)

## 2018-10-03 NOTE — Anesthesia Post-op Follow-up Note (Signed)
Anesthesia QCDR form completed.        

## 2018-10-03 NOTE — H&P (Signed)
THE PATIENT WAS SEEN PRIOR TO SURGERY TODAY.  HISTORY, ALLERGIES, HOME MEDICATIONS AND OPERATIVE PROCEDURE WERE REVIEWED. RISKS AND BENEFITS OF SURGERY DISCUSSED WITH PATIENT AGAIN.  NO CHANGES FROM INITIAL HISTORY AND PHYSICAL NOTED.    

## 2018-10-03 NOTE — Anesthesia Procedure Notes (Signed)
Procedure Name: LMA Insertion Date/Time: 10/03/2018 11:00 AM Performed by: Aline Brochure, CRNA Pre-anesthesia Checklist: Patient identified, Emergency Drugs available, Suction available and Patient being monitored Patient Re-evaluated:Patient Re-evaluated prior to induction Oxygen Delivery Method: Circle system utilized Preoxygenation: Pre-oxygenation with 100% oxygen Induction Type: IV induction Ventilation: Mask ventilation without difficulty LMA: LMA inserted LMA Size: 4.0 Number of attempts: 1 Placement Confirmation: positive ETCO2 and breath sounds checked- equal and bilateral Tube secured with: Tape Dental Injury: Teeth and Oropharynx as per pre-operative assessment

## 2018-10-03 NOTE — Progress Notes (Signed)
Right arm elevated on pillows  Ice to right extremity    Can wiggle fingers on right  Capillary refill positive

## 2018-10-04 NOTE — Progress Notes (Signed)
Pt experienced some swelling in his finger so he called the dr's office and he was told to ice it very well.

## 2018-10-17 ENCOUNTER — Other Ambulatory Visit: Payer: Self-pay | Admitting: Specialist

## 2018-10-19 ENCOUNTER — Other Ambulatory Visit: Payer: Self-pay | Admitting: Unknown Physician Specialty

## 2018-10-19 DIAGNOSIS — G8929 Other chronic pain: Secondary | ICD-10-CM

## 2018-10-19 DIAGNOSIS — M25561 Pain in right knee: Secondary | ICD-10-CM

## 2018-10-23 MED ORDER — CEFAZOLIN SODIUM-DEXTROSE 2-4 GM/100ML-% IV SOLN
2.0000 g | INTRAVENOUS | Status: AC
  Administered 2018-10-24: 2 g via INTRAVENOUS

## 2018-10-23 MED ORDER — CLINDAMYCIN PHOSPHATE 600 MG/50ML IV SOLN
600.0000 mg | INTRAVENOUS | Status: AC
  Administered 2018-10-24: 600 mg via INTRAVENOUS

## 2018-10-24 ENCOUNTER — Ambulatory Visit
Admission: RE | Admit: 2018-10-24 | Discharge: 2018-10-24 | Disposition: A | Discharge status: Home / Self Care | Payer: Managed Care, Other (non HMO) | Attending: Specialist | Admitting: Specialist

## 2018-10-24 ENCOUNTER — Ambulatory Visit: Payer: Managed Care, Other (non HMO) | Admitting: Anesthesiology

## 2018-10-24 ENCOUNTER — Encounter: Payer: Self-pay | Admitting: *Deleted

## 2018-10-24 ENCOUNTER — Encounter
Admission: RE | Disposition: A | Discharge status: Home / Self Care | Payer: Self-pay | Source: Home / Self Care | Attending: Specialist

## 2018-10-24 ENCOUNTER — Other Ambulatory Visit: Payer: Self-pay

## 2018-10-24 DIAGNOSIS — G5602 Carpal tunnel syndrome, left upper limb: Secondary | ICD-10-CM | POA: Insufficient documentation

## 2018-10-24 DIAGNOSIS — K219 Gastro-esophageal reflux disease without esophagitis: Secondary | ICD-10-CM | POA: Diagnosis not present

## 2018-10-24 DIAGNOSIS — Z79899 Other long term (current) drug therapy: Secondary | ICD-10-CM | POA: Diagnosis not present

## 2018-10-24 DIAGNOSIS — J45909 Unspecified asthma, uncomplicated: Secondary | ICD-10-CM | POA: Diagnosis not present

## 2018-10-24 DIAGNOSIS — I10 Essential (primary) hypertension: Secondary | ICD-10-CM | POA: Diagnosis not present

## 2018-10-24 DIAGNOSIS — G4730 Sleep apnea, unspecified: Secondary | ICD-10-CM | POA: Insufficient documentation

## 2018-10-24 HISTORY — PX: CARPAL TUNNEL RELEASE: SHX101

## 2018-10-24 LAB — GLUCOSE, CAPILLARY
Glucose-Capillary: 171 mg/dL — ABNORMAL HIGH (ref 70–99)
Glucose-Capillary: 146 mg/dL — ABNORMAL HIGH (ref 70–99)

## 2018-10-24 SURGERY — CARPAL TUNNEL RELEASE
Anesthesia: General | Site: Arm Lower | Laterality: Left

## 2018-10-24 MED ORDER — LIDOCAINE HCL (CARDIAC) PF 100 MG/5ML IV SOSY
PREFILLED_SYRINGE | INTRAVENOUS | Status: DC | PRN
  Administered 2018-10-24: 100 mg via INTRAVENOUS

## 2018-10-24 MED ORDER — PROPOFOL 10 MG/ML IV BOLUS
INTRAVENOUS | Status: DC | PRN
  Administered 2018-10-24: 170 mg via INTRAVENOUS

## 2018-10-24 MED ORDER — BUPIVACAINE HCL 0.5 % IJ SOLN
INTRAMUSCULAR | Status: DC | PRN
  Administered 2018-10-24: 17 mL

## 2018-10-24 MED ORDER — HYDROCODONE-ACETAMINOPHEN 5-325 MG PO TABS: 1.0000 | ORAL_TABLET | Freq: Four times a day (QID) | ORAL | 0 refills | Status: DC | PRN

## 2018-10-24 MED ORDER — MELOXICAM 7.5 MG PO TABS
ORAL_TABLET | ORAL | Status: AC
  Administered 2018-10-24: 15 mg via ORAL
  Filled 2018-10-24: qty 2

## 2018-10-24 MED ORDER — MELOXICAM 7.5 MG PO TABS
15.0000 mg | ORAL_TABLET | ORAL | Status: AC
  Administered 2018-10-24: 15 mg via ORAL

## 2018-10-24 MED ORDER — CLINDAMYCIN PHOSPHATE 600 MG/50ML IV SOLN
INTRAVENOUS | Status: AC
  Filled 2018-10-24: qty 50

## 2018-10-24 MED ORDER — PROPOFOL 10 MG/ML IV BOLUS
INTRAVENOUS | Status: AC
  Filled 2018-10-24: qty 20

## 2018-10-24 MED ORDER — SODIUM CHLORIDE 0.9 % IV SOLN
INTRAVENOUS | Status: DC
  Administered 2018-10-24: 07:00:00 via INTRAVENOUS

## 2018-10-24 MED ORDER — MELOXICAM 15 MG PO TABS: 15.0000 mg | ORAL_TABLET | Freq: Every day | ORAL | 3 refills | Status: DC

## 2018-10-24 MED ORDER — DEXAMETHASONE SODIUM PHOSPHATE 10 MG/ML IJ SOLN
INTRAMUSCULAR | Status: DC | PRN
  Administered 2018-10-24: 10 mg via INTRAVENOUS

## 2018-10-24 MED ORDER — FENTANYL CITRATE (PF) 100 MCG/2ML IJ SOLN
INTRAMUSCULAR | Status: AC
  Filled 2018-10-24: qty 2

## 2018-10-24 MED ORDER — MIDAZOLAM HCL 2 MG/2ML IJ SOLN
INTRAMUSCULAR | Status: DC | PRN
  Administered 2018-10-24: 2 mg via INTRAVENOUS

## 2018-10-24 MED ORDER — FENTANYL CITRATE (PF) 100 MCG/2ML IJ SOLN
INTRAMUSCULAR | Status: AC
  Administered 2018-10-24: 25 ug via INTRAVENOUS
  Filled 2018-10-24: qty 2

## 2018-10-24 MED ORDER — LACTATED RINGERS IV SOLN
INTRAVENOUS | Status: DC | PRN
  Administered 2018-10-24: 08:00:00 via INTRAVENOUS

## 2018-10-24 MED ORDER — ONDANSETRON HCL 4 MG/2ML IJ SOLN: 4.0000 mg | Freq: Once | INTRAMUSCULAR | Status: DC | PRN

## 2018-10-24 MED ORDER — ACETAMINOPHEN 10 MG/ML IV SOLN
INTRAVENOUS | Status: AC
  Filled 2018-10-24: qty 100

## 2018-10-24 MED ORDER — CEFAZOLIN SODIUM-DEXTROSE 2-4 GM/100ML-% IV SOLN
INTRAVENOUS | Status: AC
  Filled 2018-10-24: qty 100

## 2018-10-24 MED ORDER — ACETAMINOPHEN 10 MG/ML IV SOLN
INTRAVENOUS | Status: DC | PRN
  Administered 2018-10-24: 1000 mg via INTRAVENOUS

## 2018-10-24 MED ORDER — GABAPENTIN 400 MG PO CAPS: 400.0000 mg | ORAL_CAPSULE | Freq: Three times a day (TID) | ORAL | 3 refills | Status: DC

## 2018-10-24 MED ORDER — MIDAZOLAM HCL 2 MG/2ML IJ SOLN
INTRAMUSCULAR | Status: AC
  Filled 2018-10-24: qty 2

## 2018-10-24 MED ORDER — GLYCOPYRROLATE 0.2 MG/ML IJ SOLN
INTRAMUSCULAR | Status: AC
  Filled 2018-10-24: qty 1

## 2018-10-24 MED ORDER — CHLORHEXIDINE GLUCONATE CLOTH 2 % EX PADS: 6.0000 | MEDICATED_PAD | Freq: Once | CUTANEOUS | Status: DC

## 2018-10-24 MED ORDER — FENTANYL CITRATE (PF) 100 MCG/2ML IJ SOLN
INTRAMUSCULAR | Status: DC | PRN
  Administered 2018-10-24 (×2): 50 ug via INTRAVENOUS

## 2018-10-24 MED ORDER — FENTANYL CITRATE (PF) 100 MCG/2ML IJ SOLN
25.0000 ug | INTRAMUSCULAR | Status: DC | PRN
  Administered 2018-10-24 (×2): 25 ug via INTRAVENOUS

## 2018-10-24 MED ORDER — GABAPENTIN 300 MG PO CAPS
ORAL_CAPSULE | ORAL | Status: AC
  Filled 2018-10-24: qty 1

## 2018-10-24 MED ORDER — GABAPENTIN 300 MG PO CAPS: 300.0000 mg | ORAL_CAPSULE | ORAL | Status: DC

## 2018-10-24 MED ORDER — PHENYLEPHRINE HCL 10 MG/ML IJ SOLN
INTRAMUSCULAR | Status: DC | PRN
  Administered 2018-10-24 (×2): 100 ug via INTRAVENOUS

## 2018-10-24 MED ORDER — ONDANSETRON HCL 4 MG/2ML IJ SOLN
INTRAMUSCULAR | Status: DC | PRN
  Administered 2018-10-24: 4 mg via INTRAVENOUS

## 2018-10-24 SURGICAL SUPPLY — 27 items
BLADE SURG MINI STRL (BLADE) ×3 IMPLANT
BNDG ESMARK 4X12 TAN STRL LF (GAUZE/BANDAGES/DRESSINGS) ×3 IMPLANT
CANISTER SUCT 1200ML W/VALVE (MISCELLANEOUS) ×3 IMPLANT
CHLORAPREP W/TINT 26ML (MISCELLANEOUS) ×3 IMPLANT
COVER WAND RF STERILE (DRAPES) ×3 IMPLANT
CUFF TOURN 18 STER (MISCELLANEOUS) IMPLANT
DRSG GAUZE FLUFF 36X18 (GAUZE/BANDAGES/DRESSINGS) ×3 IMPLANT
ELECT REM PT RETURN 9FT ADLT (ELECTROSURGICAL) ×3
ELECTRODE REM PT RTRN 9FT ADLT (ELECTROSURGICAL) ×1 IMPLANT
GAUZE PETRO XEROFOAM 1X8 (MISCELLANEOUS) ×3 IMPLANT
GLOVE BIO SURGEON STRL SZ8 (GLOVE) ×3 IMPLANT
GOWN STRL REUS W/ TWL LRG LVL3 (GOWN DISPOSABLE) ×1 IMPLANT
GOWN STRL REUS W/TWL LRG LVL3 (GOWN DISPOSABLE) ×2
GOWN STRL REUS W/TWL LRG LVL4 (GOWN DISPOSABLE) ×3 IMPLANT
KIT TURNOVER KIT A (KITS) ×3 IMPLANT
NS IRRIG 500ML POUR BTL (IV SOLUTION) ×3 IMPLANT
PACK EXTREMITY ARMC (MISCELLANEOUS) ×3 IMPLANT
PAD PREP 24X41 OB/GYN DISP (PERSONAL CARE ITEMS) ×3 IMPLANT
PADDING CAST 4IN STRL (MISCELLANEOUS) ×2
PADDING CAST BLEND 4X4 STRL (MISCELLANEOUS) ×1 IMPLANT
SPLINT CAST 1 STEP 3X12 (MISCELLANEOUS) ×3 IMPLANT
STOCKINETTE BIAS CUT 4 980044 (GAUZE/BANDAGES/DRESSINGS) ×3 IMPLANT
STOCKINETTE STRL 4IN 9604848 (GAUZE/BANDAGES/DRESSINGS) ×3 IMPLANT
SUT ETHILON 4-0 (SUTURE) ×2
SUT ETHILON 4-0 FS2 18XMFL BLK (SUTURE) ×1
SUT ETHILON 5-0 FS-2 18 BLK (SUTURE) ×3 IMPLANT
SUTURE ETHLN 4-0 FS2 18XMF BLK (SUTURE) ×1 IMPLANT

## 2018-10-24 NOTE — Anesthesia Postprocedure Evaluation (Signed)
Anesthesia Post Note  Patient: [name]  Procedure(s) Performed: CARPAL TUNNEL RELEASE (Left Arm Lower)  Patient location during evaluation: PACU Anesthesia Type: General Level of consciousness: awake and alert Pain management: pain level controlled Vital Signs Assessment: post-procedure vital signs reviewed and stable Respiratory status: spontaneous breathing, nonlabored ventilation, respiratory function stable and patient connected to nasal cannula oxygen Cardiovascular status: blood pressure returned to baseline and stable Postop Assessment: no apparent nausea or vomiting Anesthetic complications: no     Last Vitals:  Vitals:   10/24/18 0937 10/24/18 1020  BP: 113/79 112/73  Pulse: 79 72  Resp: 16 18  Temp: (!) 35.9 C 36.5 C  SpO2: 96% 98%    Last Pain:  Vitals:   10/24/18 1020  TempSrc: Oral  PainSc: 0-No pain                 Molli Barrows

## 2018-10-24 NOTE — Anesthesia Procedure Notes (Signed)
Procedure Name: LMA Insertion Date/Time: 10/24/2018 8:15 AM Performed by: Nelda Marseille, CRNA Pre-anesthesia Checklist: Patient identified, Patient being monitored, Timeout performed, Emergency Drugs available and Suction available Patient Re-evaluated:Patient Re-evaluated prior to induction Oxygen Delivery Method: Circle system utilized Preoxygenation: Pre-oxygenation with 100% oxygen Induction Type: IV induction Ventilation: Mask ventilation without difficulty LMA: LMA inserted LMA Size: 4.0 Tube type: Oral Number of attempts: 1 Placement Confirmation: positive ETCO2 and breath sounds checked- equal and bilateral Tube secured with: Tape Dental Injury: Teeth and Oropharynx as per pre-operative assessment

## 2018-10-24 NOTE — Anesthesia Preprocedure Evaluation (Signed)
Anesthesia Evaluation  Patient identified by MRN, date of birth, ID band Patient awake    Reviewed: Allergy & Precautions, H&P , NPO status , Patient's Chart, lab work & pertinent test results, reviewed documented beta blocker date and time   History of Anesthesia Complications (+) history of anesthetic complications  Airway Mallampati: II  TM Distance: >3 FB Neck ROM: full    Dental  (+) Teeth Intact   Pulmonary asthma , sleep apnea and Continuous Positive Airway Pressure Ventilation ,    Pulmonary exam normal        Cardiovascular Exercise Tolerance: Good hypertension, On Medications negative cardio ROS Normal cardiovascular exam Rate:Normal     Neuro/Psych  Neuromuscular disease negative psych ROS   GI/Hepatic Neg liver ROS, GERD  ,  Endo/Other  negative endocrine ROSdiabetes  Renal/GU Renal disease  negative genitourinary   Musculoskeletal   Abdominal   Peds  Hematology  (+) Blood dyscrasia, anemia ,   Anesthesia Other Findings   Reproductive/Obstetrics negative OB ROS                             Anesthesia Physical Anesthesia Plan  ASA: III  Anesthesia Plan: General LMA   Post-op Pain Management:    Induction:   PONV Risk Score and Plan:   Airway Management Planned:   Additional Equipment:   Intra-op Plan:   Post-operative Plan:   Informed Consent: I have reviewed the patients History and Physical, chart, labs and discussed the procedure including the risks, benefits and alternatives for the proposed anesthesia with the patient or authorized representative who has indicated his/her understanding and acceptance.     Plan Discussed with: CRNA  Anesthesia Plan Comments:         Anesthesia Quick Evaluation

## 2018-10-24 NOTE — Discharge Instructions (Addendum)
°  Carpal Tunnel Release, Care After Refer to this sheet in the next few weeks. These instructions provide you with information about caring for yourself after your procedure. Your health care provider may also give you more specific instructions. Your treatment has been planned according to current medical practices, but problems sometimes occur. Call your health care provider if you have any problems or questions after your procedure. What can I expect after the procedure? After your procedure, it is typical to have the following:  Pain.  Numbness.  Tingling.  Swelling.  Stiffness.  Bruising.  Follow these instructions at home:  Take medicines only as directed by your health care provider.  There are many different ways to close and cover an incision, including stitches (sutures), skin glue, and adhesive strips. Follow your health care provider's instructions about: ? Incision care. ? Bandage (dressing) changes and removal. ? Incision closure removal.  Wear a splint or a brace as directed by your surgeon. You may need to do this for 2-3 weeks.  Keep your hand raised (elevated) above the level of your heart while you are resting. Move your fingers often.  Avoid activities that cause hand pain.  Ask your surgeon when you can start to do all of your usual activities again, such as: ? Driving. ? Returning to work. ? Bathing and swimming.  Keep all follow-up visits as directed by your health care provider. This is important. You may need physical therapy for several months to speed healing and regain movement. Contact a health care provider if:  You have drainage, redness, swelling, or pain at your incision site.  You have a fever.  You have chills.  Your pain medicine is not working.  Your symptoms do not go away after 2 months.  Your symptoms go away and then return. Get help right away if:  You have pain or numbness that is getting worse.  Your fingers change  color.  You are not able to move your fingers. This information is not intended to replace advice given to you by your health care provider. Make sure you discuss any questions you have with your health care provider. Document Released: 05/15/2005 Document Revised: 04/02/2016 Document Reviewed: 06/13/2014 Elsevier Interactive Patient Education  2018 [place]   1) The drugs that you were given will stay in your system until tomorrow so for the next 24 hours you should not:  A) Drive an automobile B) Make any legal decisions C) Drink any alcoholic beverage   2) You may resume regular meals tomorrow.  Today it is better to start with liquids and gradually work up to solid foods.  You may eat anything you prefer, but it is better to start with liquids, then soup and crackers, and gradually work up to solid foods.   3) Please notify your doctor immediately if you have any unusual bleeding, trouble breathing, redness and pain at the surgery site, drainage, fever, or pain not relieved by medication.    4) Additional Instructions:        Please contact your physician with any problems or Same Day Surgery at [phone], Monday through Friday 6 am to 4 pm, or [place] [facility] number at [phone].

## 2018-10-24 NOTE — Transfer of Care (Signed)
Immediate Anesthesia Transfer of Care Note  Patient: [name]  Procedure(s) Performed: CARPAL TUNNEL RELEASE (Left Arm Lower)  Patient Location: PACU  Anesthesia Type:General  Level of Consciousness: awake and sedated  Airway & Oxygen Therapy: Patient Spontanous Breathing and Patient connected to face mask oxygen  Post-op Assessment: Report given to RN and Post -op Vital signs reviewed and stable  Post vital signs: Reviewed and stable  Last Vitals:  Vitals Value Taken Time  BP    Temp    Pulse    Resp    SpO2      Last Pain:  Vitals:   10/24/18 0613  TempSrc: Tympanic  PainSc: 0-No pain         Complications: No apparent anesthesia complications

## 2018-10-24 NOTE — Anesthesia Post-op Follow-up Note (Signed)
Anesthesia QCDR form completed.        

## 2018-10-24 NOTE — Op Note (Signed)
10/24/2018  8:37 AM  PATIENT:  Jesse Alvarado    PRE-OPERATIVE DIAGNOSIS: LEFT CARPAL TUNNEL SYNDROME  POST-OPERATIVE DIAGNOSIS: LEFT CARPAL TUNNEL SYNDROME  PROCEDURE:  LEFT CARPAL TUNNEL RELEASE  SURGEON: Park Breed, MD  TOURNIQUET TIME: 19  MIN   ANESTHESIA:   General  PREOPERATIVE INDICATIONS:  [name] is a  52 y.o. male with a diagnosis of left carpal tunnel syndrome who failed conservative measures and elected for surgical management.    The risks benefits and alternatives were discussed with the patient preoperatively including but not limited to the risks of infection, bleeding, nerve injury, incomplete relief of symptoms, pillar pain, cardiopulmonary complications, the need for revision surgery, among others, and the patient was willing to proceed.  OPERATIVE FINDINGS: Thickened volar ligament and nerve compression.  OPERATIVE PROCEDURE: The patient is brought to the operating room placed in the supine position. General anesthesia was administered. The left upper extremity was prepped and draped in usual sterile fashion. Time out was performed. The arm was elevated and exsanguinated and the tourniquet was inflated. Incision was made in line with the radial border of the ring finger. The carpal tunnel transverse fascia was identified, cleaned, and incised sharply. The common sensory branches were visualized along with the superficial palmar arch and protected.  The median nerve was protected below. A Kelly clamp was  placed underneath the transverse carpal ligament, protecting the nerve. I released the ligament completely, and then released the proximal distal volar forearm fascia. The nerve was identified, and visualized, and protected throughout the case. The motor branch was intact upon inspection. No masses or abnormalities were identified in the ulnar bursa.  The wounds were irrigated copiously and the skin closed with nylon. The wound was injected with 1/2 %  marcaine followed by a sterile dressing and volar splint. Tourniquet was deflated with good return of blood flow to all fingers. Sponge and needle counts were correct.  The patient tolerated this well, with no complications. The patient was awakened and taken to recovery in good condition.

## 2018-10-24 NOTE — H&P (Signed)
THE PATIENT WAS SEEN PRIOR TO SURGERY TODAY.  HISTORY, ALLERGIES, HOME MEDICATIONS AND OPERATIVE PROCEDURE WERE REVIEWED. RISKS AND BENEFITS OF SURGERY DISCUSSED WITH PATIENT AGAIN.  NO CHANGES FROM INITIAL HISTORY AND PHYSICAL NOTED.    

## 2018-11-03 ENCOUNTER — Ambulatory Visit: Admission: RE | Admit: 2018-11-03 | Payer: Managed Care, Other (non HMO) | Source: Ambulatory Visit

## 2018-11-03 ENCOUNTER — Ambulatory Visit
Admission: RE | Admit: 2018-11-03 | Discharge: 2018-11-03 | Disposition: A | Discharge status: Home / Self Care | Payer: Managed Care, Other (non HMO) | Source: Ambulatory Visit | Attending: Unknown Physician Specialty | Admitting: Unknown Physician Specialty

## 2018-11-03 DIAGNOSIS — M25561 Pain in right knee: Secondary | ICD-10-CM | POA: Diagnosis present

## 2018-11-03 DIAGNOSIS — G8929 Other chronic pain: Secondary | ICD-10-CM | POA: Diagnosis not present

## 2018-11-15 ENCOUNTER — Other Ambulatory Visit: Payer: Self-pay

## 2018-11-15 ENCOUNTER — Encounter: Payer: Self-pay | Admitting: *Deleted

## 2018-11-18 ENCOUNTER — Ambulatory Visit: Payer: Managed Care, Other (non HMO) | Admitting: Anesthesiology

## 2018-11-18 ENCOUNTER — Ambulatory Visit
Admission: RE | Admit: 2018-11-18 | Discharge: 2018-11-18 | Disposition: A | Discharge status: Home / Self Care | Payer: Managed Care, Other (non HMO) | Attending: Orthopedic Surgery | Admitting: Orthopedic Surgery

## 2018-11-18 ENCOUNTER — Encounter
Admission: RE | Disposition: A | Discharge status: Home / Self Care | Payer: Self-pay | Source: Home / Self Care | Attending: Orthopedic Surgery

## 2018-11-18 DIAGNOSIS — Z794 Long term (current) use of insulin: Secondary | ICD-10-CM | POA: Diagnosis not present

## 2018-11-18 DIAGNOSIS — Z9989 Dependence on other enabling machines and devices: Secondary | ICD-10-CM | POA: Insufficient documentation

## 2018-11-18 DIAGNOSIS — Z7982 Long term (current) use of aspirin: Secondary | ICD-10-CM | POA: Insufficient documentation

## 2018-11-18 DIAGNOSIS — G4730 Sleep apnea, unspecified: Secondary | ICD-10-CM | POA: Insufficient documentation

## 2018-11-18 DIAGNOSIS — E785 Hyperlipidemia, unspecified: Secondary | ICD-10-CM | POA: Insufficient documentation

## 2018-11-18 DIAGNOSIS — Z7951 Long term (current) use of inhaled steroids: Secondary | ICD-10-CM | POA: Diagnosis not present

## 2018-11-18 DIAGNOSIS — S83241A Other tear of medial meniscus, current injury, right knee, initial encounter: Secondary | ICD-10-CM | POA: Insufficient documentation

## 2018-11-18 DIAGNOSIS — E119 Type 2 diabetes mellitus without complications: Secondary | ICD-10-CM | POA: Diagnosis not present

## 2018-11-18 DIAGNOSIS — Z79899 Other long term (current) drug therapy: Secondary | ICD-10-CM | POA: Insufficient documentation

## 2018-11-18 DIAGNOSIS — K219 Gastro-esophageal reflux disease without esophagitis: Secondary | ICD-10-CM | POA: Insufficient documentation

## 2018-11-18 DIAGNOSIS — X58XXXA Exposure to other specified factors, initial encounter: Secondary | ICD-10-CM | POA: Insufficient documentation

## 2018-11-18 DIAGNOSIS — I10 Essential (primary) hypertension: Secondary | ICD-10-CM | POA: Diagnosis not present

## 2018-11-18 DIAGNOSIS — D649 Anemia, unspecified: Secondary | ICD-10-CM | POA: Insufficient documentation

## 2018-11-18 DIAGNOSIS — J45909 Unspecified asthma, uncomplicated: Secondary | ICD-10-CM | POA: Insufficient documentation

## 2018-11-18 HISTORY — DX: Presence of external hearing-aid: Z97.4

## 2018-11-18 HISTORY — PX: KNEE ARTHROSCOPY WITH MENISCAL REPAIR: SHX5653

## 2018-11-18 LAB — GLUCOSE, CAPILLARY
Glucose-Capillary: 91 mg/dL (ref 70–99)
Glucose-Capillary: 97 mg/dL (ref 70–99)

## 2018-11-18 SURGERY — ARTHROSCOPY, KNEE, WITH MENISCUS REPAIR
Anesthesia: General | Site: Knee | Laterality: Right

## 2018-11-18 MED ORDER — ACETAMINOPHEN 500 MG PO TABS: 1000.0000 mg | ORAL_TABLET | Freq: Three times a day (TID) | ORAL | 2 refills | Status: AC

## 2018-11-18 MED ORDER — DEXTROSE 5 % IV SOLN
2000.0000 mg | Freq: Once | INTRAVENOUS | Status: AC
  Administered 2018-11-18: 2000 mg via INTRAVENOUS

## 2018-11-18 MED ORDER — PROPOFOL 10 MG/ML IV BOLUS
INTRAVENOUS | Status: DC | PRN
  Administered 2018-11-18: 200 mg via INTRAVENOUS

## 2018-11-18 MED ORDER — ONDANSETRON HCL 4 MG/2ML IJ SOLN
INTRAMUSCULAR | Status: DC | PRN
  Administered 2018-11-18: 4 mg via INTRAVENOUS

## 2018-11-18 MED ORDER — LIDOCAINE-EPINEPHRINE 1 %-1:100000 IJ SOLN
INTRAMUSCULAR | Status: DC | PRN
  Administered 2018-11-18: 12 mL via INTRAMUSCULAR
  Administered 2018-11-18: 3 mL via INTRAMUSCULAR

## 2018-11-18 MED ORDER — ACETAMINOPHEN 10 MG/ML IV SOLN
1000.0000 mg | Freq: Once | INTRAVENOUS | Status: AC
  Administered 2018-11-18: 1000 mg via INTRAVENOUS

## 2018-11-18 MED ORDER — ASPIRIN EC 325 MG PO TBEC: 325.0000 mg | DELAYED_RELEASE_TABLET | Freq: Every day | ORAL | 0 refills | Status: AC

## 2018-11-18 MED ORDER — LACTATED RINGERS IV SOLN
10.0000 mL/h | INTRAVENOUS | Status: DC
  Administered 2018-11-18: 10 mL/h via INTRAVENOUS

## 2018-11-18 MED ORDER — FENTANYL CITRATE (PF) 100 MCG/2ML IJ SOLN: 25.0000 ug | INTRAMUSCULAR | Status: DC | PRN

## 2018-11-18 MED ORDER — LIDOCAINE HCL (CARDIAC) PF 100 MG/5ML IV SOSY
PREFILLED_SYRINGE | INTRAVENOUS | Status: DC | PRN
  Administered 2018-11-18: 40 mg via INTRATRACHEAL

## 2018-11-18 MED ORDER — ONDANSETRON 4 MG PO TBDP: 4.0000 mg | ORAL_TABLET | Freq: Three times a day (TID) | ORAL | 0 refills | Status: DC | PRN

## 2018-11-18 MED ORDER — ONDANSETRON HCL 4 MG/2ML IJ SOLN: 4.0000 mg | Freq: Once | INTRAMUSCULAR | Status: DC | PRN

## 2018-11-18 MED ORDER — IBUPROFEN 800 MG PO TABS: 800.0000 mg | ORAL_TABLET | Freq: Three times a day (TID) | ORAL | 0 refills | Status: AC

## 2018-11-18 MED ORDER — OXYCODONE HCL 5 MG PO TABS
5.0000 mg | ORAL_TABLET | Freq: Once | ORAL | Status: AC | PRN
  Administered 2018-11-18: 5 mg via ORAL

## 2018-11-18 MED ORDER — FENTANYL CITRATE (PF) 100 MCG/2ML IJ SOLN
INTRAMUSCULAR | Status: DC | PRN
  Administered 2018-11-18: 100 ug via INTRAVENOUS

## 2018-11-18 MED ORDER — MIDAZOLAM HCL 5 MG/5ML IJ SOLN
INTRAMUSCULAR | Status: DC | PRN
  Administered 2018-11-18: 2 mg via INTRAVENOUS

## 2018-11-18 MED ORDER — HYDROCODONE-ACETAMINOPHEN 5-325 MG PO TABS: 1.0000 | ORAL_TABLET | ORAL | 0 refills | Status: DC | PRN

## 2018-11-18 SURGICAL SUPPLY — 43 items
ADAPTER IRRIG TUBE 2 SPIKE SOL (ADAPTER) ×6 IMPLANT
BANDAGE ACE 6X5 VEL STRL LF (GAUZE/BANDAGES/DRESSINGS) ×3 IMPLANT
BLADE SURG 15 STRL LF DISP TIS (BLADE) ×1 IMPLANT
BLADE SURG 15 STRL SS (BLADE) ×2
BLADE SURG SZ11 CARB STEEL (BLADE) ×3 IMPLANT
BNDG ESMARK 6X12 TAN STRL LF (GAUZE/BANDAGES/DRESSINGS) ×2 IMPLANT
BRACE KNEE POST OP SHORT (BRACE) ×3 IMPLANT
BUR RADIUS 4.0X18.5 (BURR) ×2 IMPLANT
CARTRIDGE SUT 2-0 NONSTITCH (Anchor) ×8 IMPLANT
CHLORAPREP W/TINT 26ML (MISCELLANEOUS) ×3 IMPLANT
COOLER POLAR GLACIER W/PUMP (MISCELLANEOUS) ×3 IMPLANT
COVER LIGHT HANDLE UNIVERSAL (MISCELLANEOUS) ×6 IMPLANT
CUFF TOURN SGL QUICK 30 (TOURNIQUET CUFF) ×2
CUFF TRNQT CYL 30X4X21-28X (TOURNIQUET CUFF) ×1 IMPLANT
DRAPE IMP U-DRAPE 54X76 (DRAPES) ×3 IMPLANT
DRAPE U-SHAPE 48X52 POLY STRL (PACKS) ×3 IMPLANT
GAUZE SPONGE 4X4 12PLY STRL (GAUZE/BANDAGES/DRESSINGS) ×3 IMPLANT
GLOVE BIO SURGEON STRL SZ7.5 (GLOVE) ×6 IMPLANT
GLOVE BIOGEL PI IND STRL 8 (GLOVE) ×2 IMPLANT
GLOVE BIOGEL PI INDICATOR 8 (GLOVE) ×4
GOWN STRL REUS W/ TWL LRG LVL3 (GOWN DISPOSABLE) ×1 IMPLANT
GOWN STRL REUS W/ TWL XL LVL3 (GOWN DISPOSABLE) ×1 IMPLANT
GOWN STRL REUS W/TWL LRG LVL3 (GOWN DISPOSABLE) ×2
GOWN STRL REUS W/TWL XL LVL3 (GOWN DISPOSABLE) ×2
IV LACTATED RINGER IRRG 3000ML (IV SOLUTION) ×14
IV LR IRRIG 3000ML ARTHROMATIC (IV SOLUTION) ×4 IMPLANT
KIT TURNOVER KIT A (KITS) ×3 IMPLANT
MANAGER SUT NOVOCUT (CUTTER) ×2 IMPLANT
MAT ABSORB  FLUID 56X50 GRAY (MISCELLANEOUS) ×4
MAT ABSORB FLUID 56X50 GRAY (MISCELLANEOUS) ×2 IMPLANT
NEPTUNE MANIFOLD (MISCELLANEOUS) ×3 IMPLANT
NOVOSTICH PRO MENISCAL 2-0 (Miscellaneous) ×3 IMPLANT
PACK ARTHROSCOPY KNEE (MISCELLANEOUS) ×3 IMPLANT
PAD WRAPON POLAR KNEE (MISCELLANEOUS) IMPLANT
PADDING CAST BLEND 6X4 STRL (MISCELLANEOUS) ×1 IMPLANT
PADDING STRL CAST 6IN (MISCELLANEOUS) ×2
SET TUBE SUCT SHAVER OUTFL 24K (TUBING) ×3 IMPLANT
SET TUBE TIP INTRA-ARTICULAR (MISCELLANEOUS) ×3 IMPLANT
SUT ETHILON 3-0 FS-10 30 BLK (SUTURE) ×3
SUTURE EHLN 3-0 FS-10 30 BLK (SUTURE) IMPLANT
SYSTEM NVSTCH PRO MENISCAL 2-0 (Miscellaneous) IMPLANT
TUBING ARTHRO INFLOW-ONLY STRL (TUBING) ×3 IMPLANT
WRAPON POLAR PAD KNEE (MISCELLANEOUS) ×3

## 2018-11-18 NOTE — H&P (Signed)
Paper H&P to be scanned into permanent record. H&P reviewed. No significant changes noted.  

## 2018-11-18 NOTE — Op Note (Signed)
Operative Note    SURGERY DATE: 11/18/2018    PRE-OP DIAGNOSIS:  1. Right medial meniscus tear   POST-OP DIAGNOSIS:  1. Right  medial meniscus tear 2. 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 Right knee arthroscopy, medial meniscus repair 2. Right knee chondroplasty of patella and medial femoral condyle   SURGEON: Cato Mulligan, MD   ANESTHESIA: Gen   ESTIMATED BLOOD LOSS: minimal   TOTAL IV FLUIDS: per anesthesia   INDICATION(S): Jesse Alvarado is a 53 y.o. male with clinical exam and MRI that were consistent with a medial meniscus tear. After discussion of risks, benefits, and alternatives to surgery, the patient elected to proceed.  The patient understands that there is a higher risk of re-tear with meniscus repair, but would prefer repair to maintain the normal biomechanics and structure of the knee. The patient is willing to perform the appropriate rehab and maintain weight-bearing restrictions post-operatively.   OPERATIVE FINDINGS:    Examination under anesthesia: A careful examination under anesthesia was performed.  Passive range of motion was: Hyperextension: 2.  Extension: 0.  Flexion: 140.  Lachman: normal. Pivot Shift: normal.  Posterior drawer: normal.  Varus stability in full extension: normal.  Varus stability in 30 degrees of flexion: normal.  Valgus stability in full extension: normal.  Valgus stability in 30 degrees of flexion: normal.   Intra-operative findings: A thorough arthroscopic examination of the knee was performed.  The findings are: 1. Suprapatellar pouch: Normal 2. Undersurface of median ridge: Grade 1 softening 3. Medial patellar facet: Grade 1 softening 4. Lateral patellar facet: Focal area of grade 2 changes 5. Trochlea: Grade 1 changes 6. Lateral gutter/popliteus tendon: Normal 7. Hoffa's fat pad: Normal 8. Medial gutter/plica: Normal 9. ACL: Normal 10. PCL: Normal 11. Medial meniscus: Horizontal  tear of the posterior horn affecting almost the entire width of the posterior horn 12. Medial compartment cartilage: Grade 1 degenerative changes to the tibial plateau and areas of Grade 2 changes to the medial femoral condyle 13. Lateral meniscus: Normal 14. Lateral compartment cartilage: Grade 1 degenerative changes to the tibial plateau, normal lateral femoral condyle   OPERATIVE REPORT:     I identified Jesse Alvarado in the pre-operative holding area.  I marked the operative knee with my initials. I reviewed the risks and benefits of the proposed surgical intervention, and the patient (and/or patient's guardian) wished to proceed. The patient was transferred to the operative suite and placed in the supine position with all bony prominences padded.  Anesthesia was administered. Appropriate IV antibiotics were administered within 30 minutes of incision. The extremity was then prepped and draped in standard fashion. A time out was performed confirming the correct extremity, correct patient, and correct procedure.   Arthroscopy portals were marked. Local anesthetic was injected to the planned portal sites. The anterolateral portal was established with an 11 blade. The arthroscope was placed in the anterolateral portal and then into the suprapatellar pouch.  Next the medial portal was established under needle localization. A spinal needle was used to pie-crust the MCL to allow for better visualization and protect the cartilage surfaces during instrumentation. The medial meniscus tear was identified. A diagnostic knee scope was completed with the above findings.    The central most tissue of the posterior horn of the medial meniscus was debrided with an arthroscopic biter as this tissue was a very poor quality.  The edges of the meniscus near the posterior horn/body junction were  smoothened with an oscillating shaver.  The edges of the meniscus tear and capsule were roughened with a rasp and shaver to  create a more optimal healing surface.  Afterwards, the meniscus was probed and felt to be stable. Ceterix Novostitch x5 all-inside sutures were passed in a hay bale fashion to reduce the horizontal meniscus tear.  A chondroplasty was performed of the patella and medial femoral condyle such that there were no loose flaps of cartilage.  Microfracture of the intercondylar notch was then performed to allow for improved meniscus healing. Arthroscopic fluid was removed from the joint.   The portals were closed with 3-0 Nylon suture. Sterile dressings included Xeroform, 4x4s, Sof-Rol, and Bias wrap. Tourniquet was released with time of 60 minutes. A Polarcare was placed. A T-scope hinged knee brace was applied.  The patient was then awakened and taken to the PACU hemodynamically stable without complication.     POSTOPERATIVE PLAN: The patient will be discharged home today once they meet PACU criteria. Aspirin 325 mg daily was prescribed for 4 weeks for DVT prophylaxis.  Physical therapy will start on POD#3-4. 50%WB x 4 weeks. F/U in 2 weeks.

## 2018-11-18 NOTE — Discharge Instructions (Signed)
Arthroscopic Knee Surgery - Meniscus Repair   Post-Op Instructions   1. Bracing or crutches: Crutches will be provided at the time of discharge from the surgery center. Keep brace locked in extension at all times except as directed by physical therapy.    2. Ice: You may be provided with a device [facility]) that allows you to ice the affected area effectively. Otherwise you can ice manually.    3. Driving:  Plan on not driving for at least four weeks. Please note that you are advised NOT to drive while taking narcotic pain medications as you may be impaired and unsafe to drive.   4. Activity: Ankle pumps several times an hour while awake to prevent blood clots. Weight bearing: 50% WEIGHT BEARING FOR 4 WEEKS. Use crutches/walker for at least 4 weeks, if not 6 based on your surgery. Bending and straightening the knee is unlimited, but do not flex your knee past 90 degrees until cleared by your therapist. Elevate knee above heart level as much as possible for one week. Avoid standing more than 5 minutes (consecutively) for the first week. No exercise involving the knee until cleared by the surgeon or physical therapist.  Avoid long distance travel for 4 weeks.   5. Medications:  - You have been provided a prescription for narcotic pain medicine. After surgery, take 1-2 narcotic tablets every 4 hours if needed for severe pain. If it has tylenol (acetaminophen), please do not take a total of more than 3000mg /day of tylenol.  - A prescription for anti-nausea medication will be provided in case the narcotic medicine causes nausea - take 1 tablet every 6 hours only if nauseated.  - Take ibuprofen 800 mg every 8 hours with food to reduce post-operative knee swelling. DO NOT STOP IBUPROFEN POST-OP UNTIL INSTRUCTED TO DO SO at first post-op office visit (10-14 days after surgery).  - Take enteric coated aspirin 325 mg once daily for 4 weeks to prevent blood clots.  -Take tylenol 1000 every 8 hours for pain.   May stop tylenol 3 days after surgery or when you are having minimal pain. If your narcotic has tylenol (acetaminophen), please do not take a total of more than 3000mg /day of tylenol.    If you are taking prescription medication for anxiety, depression, insomnia, muscle spasm, chronic pain, or for attention deficit disorder you are advised that you are at a higher risk of adverse effects with use of narcotics post-op, including narcotic addiction/dependence, depressed breathing, death. If you use non-prescribed substances: alcohol, marijuana, cocaine, heroin, methamphetamines, etc., you are at a higher risk of adverse effects with use of narcotics post-op, including narcotic addiction/dependence, depressed breathing, death. You are advised that taking > 50 morphine milligram equivalents (MME) of narcotic pain medication per day results in twice the risk of overdose or death. For your prescription provided: oxycodone 5 mg - taking more than 6 tablets per day. Be advised that we will prescribe narcotics short-term, for acute post-operative pain only - 1 week for minor operations such as knee arthroscopy for meniscus tear resection, and 3 weeks for major operations such as knee repair/reconstruction surgeries.   6. Bandages: The physical therapist should change the bandages at the first post-op appointment. If needed, the dressing supplies have been provided to you. You may shower after this with waterproof bandaids covering the incisions.    7. Physical Therapy: 2 times per week for the first 4 weeks, then 1-2 times per week from weeks 4-8 post-op. Therapy typically  starts on post operative Day 3 or 4. You have been provided an order for physical therapy. The therapist will provide home exercises.   8. Work: May return to full work when off of crutches. May do light duty/desk job in approximately 1-2 weeks when off of narcotics, pain is well-controlled, and swelling has decreased.   9. Post-Op  Appointments: Your first post-op appointment will be with Dr. Posey Pronto in approximately 2 weeks time.    If you find that they have not been scheduled please call the Orthopaedic Appointment front desk at [phone].     General Anesthesia, Adult, Care After This sheet gives you information about how to care for yourself after your procedure. Your health care provider may also give you more specific instructions. If you have problems or questions, contact your health care provider. What can I expect after the procedure? After the procedure, the following side effects are common:  Pain or discomfort at the IV site.  Nausea.  Vomiting.  Sore throat.  Trouble concentrating.  Feeling cold or chills.  Weak or tired.  Sleepiness and fatigue.  Soreness and body aches. These side effects can affect parts of the body that were not involved in surgery. Follow these instructions at home:  For at least 24 hours after the procedure:  Have a responsible adult stay with you. It is important to have someone help care for you until you are awake and alert.  Rest as needed.  Do not: ? Participate in activities in which you could fall or become injured. ? Drive. ? Use heavy machinery. ? Drink alcohol. ? Take sleeping pills or medicines that cause drowsiness. ? Make important decisions or sign legal documents. ? Take care of children on your own. Eating and drinking  Follow any instructions from your health care provider about eating or drinking restrictions.  When you feel hungry, start by eating small amounts of foods that are soft and easy to digest (bland), such as toast. Gradually return to your regular diet.  Drink enough fluid to keep your urine pale yellow.  If you vomit, rehydrate by drinking water, juice, or clear broth. General instructions  If you have sleep apnea, surgery and certain medicines can increase your risk for breathing problems. Follow instructions from  your health care provider about wearing your sleep device: ? Anytime you are sleeping, including during daytime naps. ? While taking prescription pain medicines, sleeping medicines, or medicines that make you drowsy.  Return to your normal activities as told by your health care provider. Ask your health care provider what activities are safe for you.  Take over-the-counter and prescription medicines only as told by your health care provider.  If you smoke, do not smoke without supervision.  Keep all follow-up visits as told by your health care provider. This is important. Contact a health care provider if:  You have nausea or vomiting that does not get better with medicine.  You cannot eat or drink without vomiting.  You have pain that does not get better with medicine.  You are unable to pass urine.  You develop a skin rash.  You have a fever.  You have redness around your IV site that gets worse. Get help right away if:  You have difficulty breathing.  You have chest pain.  You have blood in your urine or stool, or you vomit blood. Summary  After the procedure, it is common to have a sore throat or nausea. It is also common to  feel tired.  Have a responsible adult stay with you for the first 24 hours after general anesthesia. It is important to have someone help care for you until you are awake and alert.  When you feel hungry, start by eating small amounts of foods that are soft and easy to digest (bland), such as toast. Gradually return to your regular diet.  Drink enough fluid to keep your urine pale yellow.  Return to your normal activities as told by your health care provider. Ask your health care provider what activities are safe for you. This information is not intended to replace advice given to you by your health care provider. Make sure you discuss any questions you have with your health care provider. Document Released: 02/01/2001 Document Revised: 06/11/2017  Document Reviewed: 06/11/2017 Elsevier Interactive Patient Education  2019 [employer].

## 2018-11-18 NOTE — Anesthesia Preprocedure Evaluation (Signed)
Anesthesia Evaluation  Patient identified by MRN, date of birth, ID band  Reviewed: NPO status   History of Anesthesia Complications Negative for: history of anesthetic complications  Airway Mallampati: II  TM Distance: >3 FB Neck ROM: full    Dental no notable dental hx.    Pulmonary asthma (mild) , sleep apnea and Continuous Positive Airway Pressure Ventilation ,  Chronic dry cough : on amitriptyline for throat   Pulmonary exam normal        Cardiovascular Exercise Tolerance: Good hypertension, Normal cardiovascular exam     Neuro/Psych Recent bilateral carpal tunnel surgery in dec 2019;  negative neurological ROS  negative psych ROS   GI/Hepatic Neg liver ROS, GERD  Controlled,  Endo/Other  diabetes  Renal/GU Renal disease (stones)  negative genitourinary   Musculoskeletal  (+) Arthritis ,   Abdominal   Peds  Hematology  (+) Blood dyscrasia, anemia ,   Anesthesia Other Findings Stress test: 2018:  The study is normal.  This is a low risk study.  The left ventricular ejection fraction is normal (55-65%).  There was no ST segment deviation noted during stress.  Ekg: nsr;  Pt on amitriptyline;   Reproductive/Obstetrics                             Anesthesia Physical Anesthesia Plan  ASA: II  Anesthesia Plan: General   Post-op Pain Management:    Induction:   PONV Risk Score and Plan:   Airway Management Planned:   Additional Equipment:   Intra-op Plan:   Post-operative Plan:   Informed Consent: I have reviewed the patients History and Physical, chart, labs and discussed the procedure including the risks, benefits and alternatives for the proposed anesthesia with the patient or authorized representative who has indicated his/her understanding and acceptance.     Plan Discussed with: CRNA  Anesthesia Plan Comments:         Anesthesia Quick Evaluation

## 2018-11-18 NOTE — Transfer of Care (Signed)
Immediate Anesthesia Transfer of Care Note  Patient: Jesse Alvarado  Procedure(s) Performed: KNEE ARTHROSCOPY WITH MENISCAL REPAIR AND CHONDROPLASTY (Right Knee)  Patient Location: PACU  Anesthesia Type: General  Level of Consciousness: awake, alert  and patient cooperative  Airway and Oxygen Therapy: Patient Spontanous Breathing and Patient connected to supplemental oxygen  Post-op Assessment: Post-op Vital signs reviewed, Patient's Cardiovascular Status Stable, Respiratory Function Stable, Patent Airway and No signs of Nausea or vomiting  Post-op Vital Signs: Reviewed and stable  Complications: No apparent anesthesia complications

## 2018-11-18 NOTE — Anesthesia Postprocedure Evaluation (Signed)
Anesthesia Post Note  Patient: Jesse Alvarado  Procedure(s) Performed: KNEE ARTHROSCOPY WITH MENISCAL REPAIR AND CHONDROPLASTY (Right Knee)  Patient location during evaluation: PACU Anesthesia Type: General Level of consciousness: awake and alert Pain management: pain level controlled Vital Signs Assessment: post-procedure vital signs reviewed and stable Respiratory status: spontaneous breathing, nonlabored ventilation, respiratory function stable and patient connected to nasal cannula oxygen Cardiovascular status: blood pressure returned to baseline and stable Postop Assessment: no apparent nausea or vomiting Anesthetic complications: no    Athene Schuhmacher

## 2018-11-21 ENCOUNTER — Encounter: Payer: Self-pay | Admitting: Orthopedic Surgery

## 2018-11-28 ENCOUNTER — Telehealth: Payer: Self-pay | Admitting: Cardiology

## 2018-11-28 ENCOUNTER — Telehealth: Payer: Self-pay | Admitting: Interventional Cardiology

## 2018-11-28 MED ORDER — PRAVASTATIN SODIUM 40 MG PO TABS: 40.0000 mg | ORAL_TABLET | Freq: Every day | ORAL | 6 refills | Status: DC

## 2018-11-28 NOTE — Telephone Encounter (Signed)
New message   Patient wants to know if records from Dr. Candiss Norse have been put into his files?    Pt c/o medication issue:  1. Name of Medication: pravastatin (PRAVACHOL) 40 MG tablet  2. How are you currently taking this medication (dosage and times per day)? n/a  3. Are you having a reaction (difficulty breathing--STAT)? n/a  4. What is your medication issue? Patient states that he needs a new prescription for this medication.

## 2018-11-28 NOTE — Telephone Encounter (Signed)
Returned call to patient.He stated his wife bought in lab work ( lipid panel,a1c) from PCP last week.Stated he was calling to see if labs were scanned into chart.After reviewing chart I do not see labs.I will send message to Dr.Hochrein's CMA.Stated he needed a refill on pravastatin.Refill sent to pharmacy.

## 2018-11-28 NOTE — Telephone Encounter (Signed)
No message needed °

## 2018-12-06 NOTE — Telephone Encounter (Signed)
Lab work was received, will reviewed by Dr Percival Spanish

## 2018-12-27 ENCOUNTER — Encounter: Payer: Self-pay | Admitting: Internal Medicine

## 2018-12-27 ENCOUNTER — Ambulatory Visit (INDEPENDENT_AMBULATORY_CARE_PROVIDER_SITE_OTHER): Payer: Managed Care, Other (non HMO) | Admitting: Internal Medicine

## 2018-12-27 VITALS — BP 104/72 | HR 104 | Ht 70.0 in | Wt 183.8 lb

## 2018-12-27 DIAGNOSIS — I10 Essential (primary) hypertension: Secondary | ICD-10-CM

## 2018-12-27 DIAGNOSIS — E1100 Type 2 diabetes mellitus with hyperosmolarity without nonketotic hyperglycemic-hyperosmolar coma (NKHHC): Secondary | ICD-10-CM

## 2018-12-27 DIAGNOSIS — E781 Pure hyperglyceridemia: Secondary | ICD-10-CM

## 2018-12-27 DIAGNOSIS — R931 Abnormal findings on diagnostic imaging of heart and coronary circulation: Secondary | ICD-10-CM

## 2018-12-27 MED ORDER — ATORVASTATIN CALCIUM 80 MG PO TABS: 80.0000 mg | ORAL_TABLET | Freq: Every day | ORAL | 3 refills | Status: DC

## 2018-12-27 MED ORDER — ICOSAPENT ETHYL 1 G PO CAPS: 2.0000 | ORAL_CAPSULE | Freq: Two times a day (BID) | ORAL | 11 refills | Status: DC

## 2018-12-27 NOTE — Patient Instructions (Signed)
Dr.Hilty has recommended you make the following change in your medication:  1) STOP Pravastatin 2) START Atorvastatin (Lipitor 80mg  daily). An Rx has bee sent to your pharmacy 3) START Vascepa 2g twice daily. This may require prior authorization from your insurance, if so we will work on getting it approved.   Dr.Hilty recommends that you return for a FASTING lipid profile: 3-4 months. Please bring the orange lab slip with you that day    Dr. Debara Pickett recommends that you schedule a follow up visit with him the in the [facility] in 6 months. Please have fasting blood work about 1 week prior to this visit and he will review the blood work results with you at your appointment.

## 2018-12-29 ENCOUNTER — Encounter: Payer: Self-pay | Admitting: Internal Medicine

## 2018-12-29 DIAGNOSIS — R931 Abnormal findings on diagnostic imaging of heart and coronary circulation: Secondary | ICD-10-CM | POA: Insufficient documentation

## 2018-12-29 NOTE — Progress Notes (Signed)
LIPID CLINIC CONSULT NOTE  Chief Complaint:  Manage dyslipidemia  Primary Care Physician: Glendon Axe, MD  Primary Cardiologist:  No primary care provider on file.  HPI:  Jesse Alvarado is a 53 y.o. male who is being seen today for the evaluation of manage dyslipidemia at the request of Glendon Axe, MD.  This is a pleasant 53 year old male was kindly referred for evaluation and management of dyslipidemia.  He is a patient that is followed by my partner Dr. Percival Spanish and recently was noted to have a significantly elevated cholesterol profile.  His total cholesterol was 242 with triglycerides of 267, HDL 37 and LDL of 151.  He currently takes pravastatin 40 mg daily and fenofibrate 145 mg daily. Other medical problems include type 2 diabetes (non-insulin-dependent), chronic kidney disease, GERD, hypertension, OSA on CPAP and nephrolithiasis.  He was noted to have coronary artery calcification on his CT scan and was therefore referred to Dr. Percival Spanish for further evaluation and management.  LDL in October was 118 however most recently LDL was elevated further at 151.  He does report compliance with his medication.  PMHx:  Past Medical History:  Diagnosis Date  . Anemia    taking 3 iron pills a day  . Anemia   . Arthritis   . Asthma    sports induced asthma. takes inhalers when needed  . Cancer (HCC)    Basal cell  . Chronic kidney disease    kidney stones  . Complication of anesthesia    high tolerance to pain medication. body absorbs pain med quickly  . Cough on exercise   . Diabetes mellitus without complication ([place])   . Diverticulosis   . Erectile dysfunction   . Fatty liver   . GERD (gastroesophageal reflux disease)   . History of kidney stones   . Hyperlipidemia   . Hypertension    per patient, he does not have high bp but is treated for his kidneys and his diabetes  . Kidney stone   . Pancreatitis   . Sleep apnea    use C-PAP  . Wears hearing aid in both  ears     Past Surgical History:  Procedure Laterality Date  . CARPAL TUNNEL RELEASE Right 10/03/2018   Procedure: CARPAL TUNNEL RELEASE;  Surgeon: Earnestine Leys, MD;  Location: [facility];  Service: Orthopedics;  Laterality: Right;  . CARPAL TUNNEL RELEASE Left 10/24/2018   Procedure: CARPAL TUNNEL RELEASE;  Surgeon: Earnestine Leys, MD;  Location: [facility];  Service: Orthopedics;  Laterality: Left;  . CYSTOSCOPY WITH STENT PLACEMENT Bilateral 06/05/2016   Procedure: CYSTOSCOPY WITH STENT PLACEMENT;  Surgeon: Nickie Retort, MD;  Location: [facility];  Service: Urology;  Laterality: Bilateral;  . CYSTOSCOPY/URETEROSCOPY/HOLMIUM LASER/STENT PLACEMENT Left 04/13/2016   Procedure: CYSTOSCOPY/RETROGRADE PYELOGRAM/URETEROSCOPY WITH HOLMIUM LASER LITHOTRIPSY//STENT PLACEMENT;  Surgeon: Festus Aloe, MD;  Location: [facility];  Service: Urology;  Laterality: Left;  . ESOPHAGOGASTRODUODENOSCOPY (EGD) WITH PROPOFOL N/A 12/27/2015   Procedure: ESOPHAGOGASTRODUODENOSCOPY (EGD) WITH PROPOFOL;  Surgeon: Manya Silvas, MD;  Location: [facility];  Service: Endoscopy;  Laterality: N/A;  . EXTRACORPOREAL SHOCK WAVE LITHOTRIPSY Right 12/30/2017   Procedure: EXTRACORPOREAL SHOCK WAVE LITHOTRIPSY (ESWL);  Surgeon: Royston Cowper, MD;  Location: [facility];  Service: Urology;  Laterality: Right;  . HERNIA REPAIR  [id number]   umbilical  . KNEE ARTHROSCOPY Right 2015   had bursa sack repaired  . KNEE ARTHROSCOPY WITH MENISCAL REPAIR Right 11/18/2018   Procedure: KNEE ARTHROSCOPY WITH MENISCAL REPAIR AND  CHONDROPLASTY;  Surgeon: Leim Fabry, MD;  Location: [place] [facility];  Service: Orthopedics;  Laterality: Right;  Diabetic - oral meds sleep apnea SUPINE WITH ACL LEG HOLDER SMITH AND NEWPHEW CETERIX  . PREPATELLAR BURSA EXCISION Left 1993   and ostetomy. had at least 5 surgeries in 15 years  . SHOULDER SURGERY Right 2011   tendon was shredded and was trimmed, repaired and reattached. Screws in shoulder  .  URETEROSCOPY WITH HOLMIUM LASER LITHOTRIPSY Bilateral 06/05/2016   Procedure: URETEROSCOPY WITH HOLMIUM LASER LITHOTRIPSY;  Surgeon: Nickie Retort, MD;  Location: [facility];  Service: Urology;  Laterality: Bilateral;  . VASECTOMY  2002    FAMHx:  Family History  Problem Relation Age of Onset  . Urolithiasis Father   . Kidney disease Father   . Prostate cancer Neg Hx   . Kidney cancer Neg Hx     SOCHx:   reports that he has never smoked. He has never used smokeless tobacco. He reports that he does not drink alcohol or use drugs.  ALLERGIES:  Allergies  Allergen Reactions  . Dulaglutide Other (See Comments)    Caused pancreatitis   . Monosodium Glutamate Diarrhea  . Ciprofloxacin Other (See Comments)    GI upset    ROS: Pertinent items noted in HPI and remainder of comprehensive ROS otherwise negative.  HOME MEDS: Current Outpatient Medications on File Prior to Visit  Medication Sig Dispense Refill  . acetaminophen (TYLENOL) 500 MG tablet Take 2 tablets (1,000 mg total) by mouth every 8 (eight) hours. 90 tablet 2  . amitriptyline (ELAVIL) 25 MG tablet Take 25 mg by mouth at bedtime.  11  . Avanafil (STENDRA) 200 MG TABS Take 200 mg by mouth daily as needed (erectile dysfunction). Brand name [place]    . Azelastine-Fluticasone (DYMISTA) 137-50 MCG/ACT SUSP Place 1 spray into both nostrils 2 (two) times daily.     . benzonatate (TESSALON) 200 MG capsule Take 1 capsule (200 mg total) by mouth 3 (three) times daily as needed for cough. 20 capsule 4  . calcium citrate (CALCITRATE - DOSED IN MG ELEMENTAL CALCIUM) 950 MG tablet Take 200 mg of elemental calcium by mouth 3 (three) times daily.    . ciclopirox (PENLAC) 8 % solution Apply 1 application topically at bedtime. Apply over nail and surrounding skin. Apply daily over previous coat. After seven (7) days, may remove with alcohol and continue cycle.    . cyanocobalamin (,VITAMIN B-12,) 1000 MCG/ML injection Inject 1,000 mcg into  the muscle every 30 (thirty) days.     Marland Kitchen FARXIGA 5 MG TABS tablet Take 5 mg by mouth daily.   2  . fenofibrate (TRICOR) 145 MG tablet Take 145 mg by mouth daily.     . ferrous gluconate (FERGON) 324 MG tablet Take 324 mg by mouth 3 (three) times daily with meals.     . folic acid (FOLVITE) 1 MG tablet Take 1 mg by mouth daily.    Marland Kitchen glimepiride (AMARYL) 2 MG tablet Take 2-4 mg by mouth See admin instructions. Take 2mg  with breakfast, 2mg  with lunch, and 4mg  with supper.    Marland Kitchen HYDROcodone-acetaminophen (NORCO) 5-325 MG tablet Take 1-2 tablets by mouth every 4 (four) hours as needed for moderate pain or severe pain. 15 tablet 0  . ipratropium (ATROVENT) 0.06 % nasal spray Place 2 sprays into both nostrils 2 (two) times daily.   3  . levocetirizine (XYZAL) 5 MG tablet Take 5 mg by mouth every evening.  11  . losartan (COZAAR) 50 MG tablet Take 50 mg by mouth daily.     . metFORMIN (GLUCOPHAGE) 850 MG tablet Take 850 mg by mouth 3 (three) times daily with meals.     . Multiple Vitamin (MULTI-VITAMINS) TABS Take 1 tablet by mouth 3 (three) times daily.     . pantoprazole (PROTONIX) 40 MG tablet Take 40 mg by mouth 2 (two) times daily with a meal.     . pioglitazone (ACTOS) 15 MG tablet Take 15 mg by mouth daily.    . pravastatin (PRAVACHOL) 40 MG tablet Take 1 tablet (40 mg total) by mouth daily. 30 tablet 6  . PROAIR RESPICLICK 546 (90 Base) MCG/ACT AEPB Inhale 2 puffs into the lungs every 6 (six) hours as needed (shortness of breath).   0  . sucralfate (CARAFATE) 1 g tablet Take 1 g by mouth 3 (three) times daily with meals.     . SYMBICORT 160-4.5 MCG/ACT inhaler Inhale 2 puffs into the lungs 2 (two) times daily.   3  . tiZANidine (ZANAFLEX) 2 MG tablet Take 2-4 mg by mouth at bedtime as needed for muscle spasms.     . vitamin C (ASCORBIC ACID) 500 MG tablet Take 500 mg by mouth daily.    Marland Kitchen gabapentin (NEURONTIN) 100 MG capsule Take 1 capsule by mouth 3 (three) times daily.     No current  facility-administered medications on file prior to visit.     LABS/IMAGING: No results found for this or any previous visit (from the past 48 hour(s)). No results found.  LIPID PANEL:    Component Value Date/Time   CHOL 218 (H) 07/30/2018 0513   TRIG 314 (H) 07/30/2018 0513   HDL 37 (L) 07/30/2018 0513   CHOLHDL 5.9 07/30/2018 0513   VLDL 63 (H) 07/30/2018 0513   LDLCALC 118 (H) 07/30/2018 0513    WEIGHTS: Wt Readings from Last 3 Encounters:  12/27/18 183 lb 12.8 oz (83.4 kg)  11/18/18 180 lb (81.6 kg)  10/03/18 181 lb 14.1 oz (82.5 kg)    VITALS: BP 104/72 (BP Location: Right Arm)   Pulse (!) 104   Ht 5\' 10"  (1.778 m)   Wt 183 lb 12.8 oz (83.4 kg)   SpO2 96%   BMI 26.37 kg/m   EXAM: General appearance: alert and no distress Neck: no carotid bruit, no JVD and thyroid not enlarged, symmetric, no tenderness/mass/nodules Lungs: clear to auscultation bilaterally Heart: regular rate and rhythm, S1, S2 normal, no murmur, click, rub or gallop Abdomen: soft, non-tender; bowel sounds normal; no masses,  no organomegaly Extremities: extremities normal, atraumatic, no cyanosis or edema Pulses: 2+ and symmetric Skin: Skin color, texture, turgor normal. No rashes or lesions Neurologic: Grossly normal Psych: Pleasant  EKG: Deferred  ASSESSMENT: 1. Mixed dyslipidemia 2. High coronary artery calcium score 3. Essential hypertension 4. Type 2 diabetes (not on insulin)  PLAN: 1.   Mr. Peets has significant dyslipidemia and is still not controlled with LDL less than 70.  He is on moderate potency statin.  Is recommended that he should be on high potency statin therapy given his diabetes and additional risk factors.  In addition he has evidence of premature coronary artery disease.  I recommend stopping his pravastatin and switching him to atorvastatin 80 mg daily.  In addition he is an ideal patient that is represented in the REDUCE-IT trial, which looked at patient's that were  on statin therapy at target LDL who had elevated triglycerides.  Given  the fact that he has known underlying coronary artery calcification and elevated triglycerides, he is a good candidate for Vascepa.  Recommend adding 2 g twice daily.  We will check a repeat lipid profile in about 3 to 4 months.  Thanks again for the kind referral.  Pixie Casino, MD, FACC, [place] Director of the [facility] Diplomate of the American Board of Clinical Lipidology Attending Cardiologist  Direct Dial: [phone]  Fax: [phone]  Website:  [email or web address] 12/29/2018, 5:18 PM

## 2018-12-30 ENCOUNTER — Telehealth: Payer: Self-pay | Admitting: *Deleted

## 2018-12-30 NOTE — Telephone Encounter (Signed)
Prescription drug approval for vascepa complete and good until 12/30/2019.

## 2019-01-19 DIAGNOSIS — K2270 Barrett's esophagus without dysplasia: Secondary | ICD-10-CM | POA: Insufficient documentation

## 2019-02-10 ENCOUNTER — Telehealth: Payer: Self-pay

## 2019-02-10 NOTE — Telephone Encounter (Signed)
Copied from [place] [id number]. Topic: Appointment Scheduling - New Patient >> Feb 10, 2019  1:10 PM Sheran Luz wrote: New patient has been scheduled for your office. Provider: Dr. Aundra Dubin Date of Appointment: 6/12

## 2019-03-27 ENCOUNTER — Ambulatory Visit: Admit: 2019-03-27 | Payer: Managed Care, Other (non HMO) | Admitting: Unknown Physician Specialty

## 2019-03-27 SURGERY — ESOPHAGOGASTRODUODENOSCOPY (EGD) WITH PROPOFOL
Anesthesia: General

## 2019-04-21 ENCOUNTER — Ambulatory Visit: Payer: Managed Care, Other (non HMO) | Admitting: Internal Medicine

## 2019-04-21 LAB — LIPID PANEL
Chol/HDL Ratio: 4.2 ratio (ref 0.0–5.0)
Cholesterol, Total: 158 mg/dL (ref 100–199)
HDL: 38 mg/dL — AB (ref >39)
LDL Calculated: 101 mg/dL — ABNORMAL HIGH (ref 0–99)
Triglycerides: 96 mg/dL (ref 0–149)
VLDL Cholesterol Cal: 19 mg/dL (ref 5–40)

## 2019-05-16 ENCOUNTER — Telehealth: Payer: Self-pay

## 2019-05-16 NOTE — Telephone Encounter (Signed)
Copied from [place] [id number]. Topic: Appointment Scheduling - Scheduling Inquiry for Clinic >> May 15, 2019  5:14 PM Yvette Rack wrote: Reason for CRM: Pt called to reschedule new patient appt with Dr. Olivia Mackie. Pt requests call back.

## 2019-06-01 ENCOUNTER — Encounter: Payer: Self-pay | Admitting: Internal Medicine

## 2019-06-02 ENCOUNTER — Other Ambulatory Visit: Payer: Self-pay

## 2019-06-02 ENCOUNTER — Ambulatory Visit (INDEPENDENT_AMBULATORY_CARE_PROVIDER_SITE_OTHER): Payer: Managed Care, Other (non HMO) | Admitting: Internal Medicine

## 2019-06-02 ENCOUNTER — Other Ambulatory Visit: Payer: Self-pay | Admitting: Internal Medicine

## 2019-06-02 ENCOUNTER — Encounter: Payer: Self-pay | Admitting: Internal Medicine

## 2019-06-02 VITALS — BP 112/78 | HR 107 | Ht 70.0 in | Wt 181.0 lb

## 2019-06-02 DIAGNOSIS — E785 Hyperlipidemia, unspecified: Secondary | ICD-10-CM

## 2019-06-02 DIAGNOSIS — B351 Tinea unguium: Secondary | ICD-10-CM

## 2019-06-02 DIAGNOSIS — E118 Type 2 diabetes mellitus with unspecified complications: Secondary | ICD-10-CM | POA: Diagnosis not present

## 2019-06-02 DIAGNOSIS — R931 Abnormal findings on diagnostic imaging of heart and coronary circulation: Secondary | ICD-10-CM

## 2019-06-02 DIAGNOSIS — K760 Fatty (change of) liver, not elsewhere classified: Secondary | ICD-10-CM | POA: Insufficient documentation

## 2019-06-02 DIAGNOSIS — E1169 Type 2 diabetes mellitus with other specified complication: Secondary | ICD-10-CM

## 2019-06-02 DIAGNOSIS — Z9889 Other specified postprocedural states: Secondary | ICD-10-CM | POA: Insufficient documentation

## 2019-06-02 DIAGNOSIS — E538 Deficiency of other specified B group vitamins: Secondary | ICD-10-CM

## 2019-06-02 DIAGNOSIS — D509 Iron deficiency anemia, unspecified: Secondary | ICD-10-CM

## 2019-06-02 DIAGNOSIS — K2970 Gastritis, unspecified, without bleeding: Secondary | ICD-10-CM

## 2019-06-02 DIAGNOSIS — K2270 Barrett's esophagus without dysplasia: Secondary | ICD-10-CM | POA: Diagnosis not present

## 2019-06-02 DIAGNOSIS — I10 Essential (primary) hypertension: Secondary | ICD-10-CM

## 2019-06-02 MED ORDER — PIOGLITAZONE HCL 30 MG PO TABS: 30.0000 mg | ORAL_TABLET | Freq: Every day | ORAL | 1 refills | Status: DC

## 2019-06-02 MED ORDER — CYANOCOBALAMIN 1000 MCG/ML IJ SOLN: 1000.0000 ug | INTRAMUSCULAR | 5 refills | Status: DC

## 2019-06-02 MED ORDER — METFORMIN HCL 850 MG PO TABS: 850.0000 mg | ORAL_TABLET | Freq: Three times a day (TID) | ORAL | 1 refills | Status: DC

## 2019-06-02 MED ORDER — GLIMEPIRIDE 2 MG PO TABS: 8.0000 mg | ORAL_TABLET | ORAL | 1 refills | Status: DC

## 2019-06-02 MED ORDER — PANTOPRAZOLE SODIUM 40 MG PO TBEC: 40.0000 mg | DELAYED_RELEASE_TABLET | Freq: Two times a day (BID) | ORAL | 1 refills | Status: DC

## 2019-06-02 MED ORDER — FOLIC ACID 1 MG PO TABS: 1.0000 mg | ORAL_TABLET | Freq: Every day | ORAL | 1 refills | Status: DC

## 2019-06-02 MED ORDER — FERROUS GLUCONATE 324 (38 FE) MG PO TABS: 324.0000 mg | ORAL_TABLET | Freq: Three times a day (TID) | ORAL | 1 refills | Status: DC

## 2019-06-02 MED ORDER — CICLOPIROX 8 % EX SOLN: 1.0000 "application " | Freq: Every day | CUTANEOUS | 5 refills | Status: AC

## 2019-06-02 MED ORDER — SUCRALFATE 1 G PO TABS: 1.0000 g | ORAL_TABLET | Freq: Three times a day (TID) | ORAL | 1 refills | Status: DC

## 2019-06-02 MED ORDER — FARXIGA 5 MG PO TABS: 5.0000 mg | ORAL_TABLET | Freq: Every day | ORAL | 1 refills | Status: DC

## 2019-06-02 MED ORDER — LOSARTAN POTASSIUM 50 MG PO TABS: 50.0000 mg | ORAL_TABLET | Freq: Every day | ORAL | 1 refills | Status: DC

## 2019-06-02 NOTE — Progress Notes (Signed)
Date:  06/02/2019   Name:  Jesse Alvarado   DOB:  [birth year]   MRN:  [id number]   Chief Complaint: Establish Care and Diabetes  He feels that his DM is not well controlled despite almost supra-therapeutic doses of metformin and glimepiride.  He has high BS in the AM but then very low readings before supper.  He would like to see Endocrinology.  Diabetes He presents for his follow-up diabetic visit. He has type 2 diabetes mellitus. His disease course has been stable. Pertinent negatives for hypoglycemia include no dizziness, headaches, nervousness/anxiousness or pallor. Pertinent negatives for diabetes include no chest pain and no weight loss. Current diabetic treatment includes oral agent (triple therapy) (metformin, actos, glimepiride and [place]). He is compliant with treatment all of the time. He monitors blood glucose at home 1-2 x per day. His breakfast blood glucose is taken between 6-7 am. His breakfast blood glucose range is generally 90-110 mg/dl. His dinner blood glucose is taken between 5-6 pm. His dinner blood glucose range is generally 70-90 mg/dl. An ACE inhibitor/angiotensin II receptor blocker is being taken.  Hypertension This is a chronic problem. The problem is controlled. Pertinent negatives include no chest pain, headaches, palpitations or shortness of breath. Past treatments include angiotensin blockers. The current treatment provides significant improvement.  Gastroesophageal Reflux He complains of coughing (chronic cough followed by ENT). He reports no chest pain or no wheezing. Pertinent negatives include no melena or weight loss. Risk factors include Barrett's esophagus. He has tried a PPI and an antacid for the symptoms. Past procedures include an EGD.  Hyperlipidemia This is a chronic problem. The problem is controlled. Pertinent negatives include no chest pain or shortness of breath. Current antihyperlipidemic treatment includes statins and fibric acid derivatives  (vascepa). The current treatment provides significant improvement of lipids. There are no compliance problems.   Anemia Presents for follow-up visit. There has been no anorexia, fever, light-headedness, pallor, palpitations, pica or weight loss. Signs of blood loss that are not present include melena. Procedure history includes EGD. There are no compliance problems (taking iron, J94 and folic acid).   Nail changes - he has had nail changes from fungal infection for many years.  He has used Pen-lac for several years but is not getting results.  He denies pain from the nails.  He has seen podiatry who declined to prescribe oral medications.  Lab Results  Component Value Date   HGBA1C 7.4 (H) 07/30/2018   Lab Results  Component Value Date   WBC 7.3 07/31/2018   HGB 11.7 (L) 07/31/2018   HCT 34.6 (L) 07/31/2018   MCV 88.3 07/31/2018   PLT 268 07/31/2018   Lab Results  Component Value Date   CHOL 158 04/21/2019   HDL 38 (L) 04/21/2019   LDLCALC 101 (H) 04/21/2019   TRIG 96 04/21/2019   CHOLHDL 4.2 04/21/2019      Review of Systems  Constitutional: Negative for chills, diaphoresis, fever, unexpected weight change and weight loss.  HENT: Negative for trouble swallowing.   Eyes: Negative for visual disturbance.  Respiratory: Positive for cough (chronic cough followed by ENT). Negative for chest tightness, shortness of breath and wheezing.   Cardiovascular: Negative for chest pain, palpitations and leg swelling.  Gastrointestinal: Negative for anorexia, blood in stool, constipation, diarrhea and melena.       GERD  Genitourinary: Positive for difficulty urinating. Negative for hematuria, penile pain and urgency.  Musculoskeletal: Positive for arthralgias (recent knee surgery).  Skin:  Negative for color change, pallor and rash.       Thickened nails - left second toenail and right great toenail  Allergic/Immunologic: Positive for environmental allergies.  Neurological: Negative for  dizziness, light-headedness and headaches.  Hematological: Negative for adenopathy.  Psychiatric/Behavioral: Negative for dysphoric mood and sleep disturbance. The patient is not nervous/anxious.     Patient Active Problem List   Diagnosis Date Noted   Status post colonoscopy 06/02/2019   Hepatic steatosis 06/02/2019   Barrett's esophagus without dysplasia 01/19/2019   High coronary artery calcium score 12/29/2018   Chronic iron deficiency anemia 12/22/2017   Gastritis without bleeding 06/25/2017   Vitamin B12 deficiency 08/03/2016   OSA on CPAP 04/18/2016   Nephrolithiasis 04/13/2016   Chronic midline low back pain without sciatica 01/25/2016   Primary osteoarthritis involving multiple joints 11/20/2015   Bilateral carpal tunnel syndrome 09/14/2015   Essential hypertension 09/14/2015   Chronic knee pain 08/30/2014   Erectile dysfunction 08/30/2014   Other allergic rhinitis 08/30/2014   Onychomycosis of toenail 07/24/2014   Hyperlipidemia associated with type 2 diabetes mellitus ([place]) 04/23/2014   Type II diabetes mellitus with complication ([place]) 41/93/7902    Allergies  Allergen Reactions   Dulaglutide Other (See Comments)    Trulicity- Caused pancreatitis    Monosodium Glutamate Diarrhea    MSG   Ciprofloxacin Other (See Comments)    GI upset    Past Surgical History:  Procedure Laterality Date   CARPAL TUNNEL RELEASE Right 10/03/2018   Procedure: CARPAL TUNNEL RELEASE;  Surgeon: Earnestine Leys, MD;  Location: [facility];  Service: Orthopedics;  Laterality: Right;   CARPAL TUNNEL RELEASE Left 10/24/2018   Procedure: CARPAL TUNNEL RELEASE;  Surgeon: Earnestine Leys, MD;  Location: [facility];  Service: Orthopedics;  Laterality: Left;   CYSTOSCOPY WITH STENT PLACEMENT Bilateral 06/05/2016   Procedure: CYSTOSCOPY WITH STENT PLACEMENT;  Surgeon: Nickie Retort, MD;  Location: [facility];  Service: Urology;  Laterality: Bilateral;    CYSTOSCOPY/URETEROSCOPY/HOLMIUM LASER/STENT PLACEMENT Left 04/13/2016   Procedure: CYSTOSCOPY/RETROGRADE PYELOGRAM/URETEROSCOPY WITH HOLMIUM LASER LITHOTRIPSY//STENT PLACEMENT;  Surgeon: Festus Aloe, MD;  Location: [facility];  Service: Urology;  Laterality: Left;   ESOPHAGOGASTRODUODENOSCOPY (EGD) WITH PROPOFOL N/A 12/27/2015   Procedure: ESOPHAGOGASTRODUODENOSCOPY (EGD) WITH PROPOFOL;  Surgeon: Manya Silvas, MD;  Location: [facility];  Service: Endoscopy;  Laterality: N/A;   EXTRACORPOREAL SHOCK WAVE LITHOTRIPSY Right 12/30/2017   Procedure: EXTRACORPOREAL SHOCK WAVE LITHOTRIPSY (ESWL);  Surgeon: Royston Cowper, MD;  Location: [facility];  Service: Urology;  Laterality: Right;   HERNIA REPAIR  4097   umbilical   KNEE ARTHROSCOPY Right 2015   had bursa sack repaired   KNEE ARTHROSCOPY WITH MENISCAL REPAIR Right 11/18/2018   Procedure: KNEE ARTHROSCOPY WITH MENISCAL REPAIR AND CHONDROPLASTY;  Surgeon: Leim Fabry, MD;  Location: [place];  Service: Orthopedics;  Laterality: Right;  Diabetic - oral meds sleep apnea SUPINE WITH ACL LEG HOLDER SMITH AND NEWPHEW CETERIX   PREPATELLAR BURSA EXCISION Left 1993   and ostetomy. had at least 5 surgeries in 15 years   SHOULDER SURGERY Right 2011   tendon was shredded and was trimmed, repaired and reattached. Screws in shoulder   URETEROSCOPY WITH HOLMIUM LASER LITHOTRIPSY Bilateral 06/05/2016   Procedure: URETEROSCOPY WITH HOLMIUM LASER LITHOTRIPSY;  Surgeon: Nickie Retort, MD;  Location: [facility];  Service: Urology;  Laterality: Bilateral;   VASECTOMY  2002    Social History   Tobacco Use   Smoking status: Never Smoker   Smokeless tobacco:  Never Used  Substance Use Topics   Alcohol use: No    Comment: very rare etoh (Holidays)   Drug use: No     Medication list has been reviewed and updated.  Current Meds  Medication Sig   amitriptyline (ELAVIL) 25 MG tablet Take 25 mg by mouth at bedtime. Recurring  Cough- Dr Joya Gaskins-   aspirin EC 81 MG tablet Take 81 mg by mouth daily.   atorvastatin (LIPITOR) 80 MG tablet Take 1 tablet (80 mg total) by mouth daily.   Avanafil (STENDRA) 200 MG TABS Take 200 mg by mouth daily as needed (erectile dysfunction). Brand name [place]   Azelastine-Fluticasone (DYMISTA) 137-50 MCG/ACT SUSP Place 1 spray into both nostrils 2 (two) times daily.    benzonatate (TESSALON) 200 MG capsule Take 200 mg by mouth 3 (three) times daily as needed for cough.   calcium citrate (CALCITRATE - DOSED IN MG ELEMENTAL CALCIUM) 950 MG tablet Take 200 mg of elemental calcium by mouth 3 (three) times daily.   ciclopirox (PENLAC) 8 % solution Apply 1 application topically at bedtime. Apply over nail and surrounding skin. Apply daily over previous coat. After seven (7) days, may remove with alcohol and continue cycle.   cyanocobalamin (,VITAMIN B-12,) 1000 MCG/ML injection Inject 1,000 mcg into the muscle every 30 (thirty) days.    FARXIGA 5 MG TABS tablet Take 5 mg by mouth daily.    fenofibrate (TRICOR) 145 MG tablet Take 145 mg by mouth daily.    ferrous gluconate (FERGON) 324 MG tablet Take 324 mg by mouth 3 (three) times daily with meals.    folic acid (FOLVITE) 1 MG tablet Take 1 mg by mouth daily.   gabapentin (NEURONTIN) 100 MG capsule Take 1 capsule by mouth 3 (three) times daily.   glimepiride (AMARYL) 2 MG tablet Take 2-4 mg by mouth See admin instructions. Take 2mg  with breakfast, 2mg  with lunch, and 4mg  with supper.   Icosapent Ethyl (VASCEPA) 1 g CAPS Take 2 capsules (2 g total) by mouth 2 (two) times daily.   ipratropium (ATROVENT) 0.06 % nasal spray Place 2 sprays into both nostrils 2 (two) times daily.    ketoconazole (NIZORAL) 2 % shampoo WASH SCALP EVERY OTHER WASHING ALTERNATING WITH HEAD AND SHOULDERS. LET SIT SEVERAL MINUTES BEFORE RINSING.   levocetirizine (XYZAL) 5 MG tablet Take 5 mg by mouth every evening.    losartan (COZAAR) 50 MG tablet Take 50  mg by mouth daily.    metFORMIN (GLUCOPHAGE) 850 MG tablet Take 850 mg by mouth 3 (three) times daily with meals.    Multiple Vitamin (MULTI-VITAMINS) TABS Take 1 tablet by mouth 3 (three) times daily.    pantoprazole (PROTONIX) 40 MG tablet Take 40 mg by mouth 2 (two) times daily with a meal.    pioglitazone (ACTOS) 30 MG tablet    PROAIR RESPICLICK 782 (90 Base) MCG/ACT AEPB Inhale 2 puffs into the lungs every 6 (six) hours as needed (shortness of breath).    sucralfate (CARAFATE) 1 g tablet Take 1 g by mouth 3 (three) times daily with meals.    SYMBICORT 160-4.5 MCG/ACT inhaler Inhale 2 puffs into the lungs 2 (two) times daily.    tiZANidine (ZANAFLEX) 2 MG tablet Take 2-4 mg by mouth at bedtime as needed for muscle spasms.    traMADol (ULTRAM) 50 MG tablet Take 50 mg by mouth every 6 (six) hours as needed.   vitamin C (ASCORBIC ACID) 500 MG tablet Take 500 mg by mouth daily.  PHQ 2/9 Scores 06/02/2019  PHQ - 2 Score 0    BP Readings from Last 3 Encounters:  06/02/19 112/78  12/27/18 104/72  11/18/18 112/75    Physical Exam Vitals signs and nursing note reviewed.  Constitutional:      General: He is not in acute distress.    Appearance: Normal appearance. He is well-developed.  HENT:     Head: Normocephalic and atraumatic.  Eyes:     Extraocular Movements: Extraocular movements intact.     Conjunctiva/sclera: Conjunctivae normal.     Pupils: Pupils are equal, round, and reactive to light.  Neck:     Musculoskeletal: Normal range of motion.     Vascular: No carotid bruit.  Cardiovascular:     Rate and Rhythm: Normal rate and regular rhythm.     Pulses: Normal pulses.     Heart sounds: No murmur.  Pulmonary:     Effort: Pulmonary effort is normal. No respiratory distress.     Breath sounds: No wheezing or rhonchi.  Musculoskeletal:        General: No swelling or tenderness.     Right lower leg: No edema.     Left lower leg: No edema.  Lymphadenopathy:      Cervical: No cervical adenopathy.  Skin:    General: Skin is warm and dry.     Capillary Refill: Capillary refill takes less than 2 seconds.     Findings: No rash.  Neurological:     General: No focal deficit present.     Mental Status: He is alert and oriented to person, place, and time.     Cranial Nerves: Cranial nerves are intact.     Sensory: Sensation is intact.     Motor: Motor function is intact.     Coordination: Coordination is intact.     Gait: Gait is intact. Gait normal.  Psychiatric:        Attention and Perception: Attention normal.        Mood and Affect: Mood normal.        Speech: Speech normal.        Behavior: Behavior normal.        Thought Content: Thought content normal.        Cognition and Memory: Cognition normal.     Wt Readings from Last 3 Encounters:  06/02/19 181 lb (82.1 kg)  12/27/18 183 lb 12.8 oz (83.4 kg)  11/18/18 180 lb (81.6 kg)    BP 112/78    Pulse (!) 107    Ht 5\' 10"  (1.778 m)    Wt 181 lb (82.1 kg)    SpO2 95%    BMI 25.97 kg/m   Assessment and Plan: 1. Type II diabetes mellitus with complication (HCC) Continue current therapy since pt requests Endo referral - Hemoglobin A1c - pioglitazone (ACTOS) 30 MG tablet; Take 1 tablet (30 mg total) by mouth daily.  Dispense: 90 tablet; Refill: 1 - FARXIGA 5 MG TABS tablet; Take 5 mg by mouth daily.  Dispense: 90 tablet; Refill: 1 - cyanocobalamin (,VITAMIN B-12,) 1000 MCG/ML injection; Inject 1 mL (1,000 mcg total) into the muscle every 30 (thirty) days.  Dispense: 1 mL; Refill: 5 - glimepiride (AMARYL) 2 MG tablet; Take 4 tablets (8 mg total) by mouth See admin instructions. Take 2mg  with breakfast, 2mg  with lunch, and 4mg  with supper.  Dispense: 360 tablet; Refill: 1 - metFORMIN (GLUCOPHAGE) 850 MG tablet; Take 1 tablet (850 mg total) by mouth 3 (three) times daily  with meals.  Dispense: 270 tablet; Refill: 1 - Comprehensive metabolic panel - Ambulatory referral to Endocrinology  2.  Hyperlipidemia associated with type 2 diabetes mellitus ([place]) Much improved with atorvastatin and Vascepa May be able to stop fenofibrate  3. Barrett's esophagus without dysplasia Continue current therapy Due for follow up EGD - pt to schedule with Dr. Vira Agar - sucralfate (CARAFATE) 1 g tablet; Take 1 tablet (1 g total) by mouth 3 (three) times daily with meals.  Dispense: 270 tablet; Refill: 1 - pantoprazole (PROTONIX) 40 MG tablet; Take 1 tablet (40 mg total) by mouth 2 (two) times daily with a meal.  Dispense: 180 tablet; Refill: 1  4. Onychomycosis of toenail Continue current therapy - may not resolve current issues but may limit spread Pt is not a good candidate for oral therapy due to lack of pain and possible toxicity  - ciclopirox (PENLAC) 8 % solution; Apply 1 application topically at bedtime. Apply over nail and surrounding skin. Apply daily over previous coat. After seven (7) days, may remove with alcohol and continue cycle.  Dispense: 6.6 mL; Refill: 5  5. Chronic iron deficiency anemia Continue oral iron, folate and monthly B12 injection at home - folic acid (FOLVITE) 1 MG tablet; Take 1 tablet (1 mg total) by mouth daily.  Dispense: 90 tablet; Refill: 1 - ferrous gluconate (FERGON) 324 MG tablet; Take 1 tablet (324 mg total) by mouth 3 (three) times daily with meals.  Dispense: 270 tablet; Refill: 1 - CBC with Differential/Platelet  6. Essential hypertension controlled - losartan (COZAAR) 50 MG tablet; Take 1 tablet (50 mg total) by mouth daily.  Dispense: 90 tablet; Refill: 1

## 2019-06-03 LAB — CBC WITH DIFFERENTIAL/PLATELET
Basophils Absolute: 0 10*3/uL (ref 0.0–0.2)
Basos: 1 %
EOS (ABSOLUTE): 0 10*3/uL (ref 0.0–0.4)
Eos: 1 %
Hematocrit: 40 % (ref 37.5–51.0)
Hemoglobin: 13.5 g/dL (ref 13.0–17.7)
Immature Grans (Abs): 0 10*3/uL (ref 0.0–0.1)
Immature Granulocytes: 0 %
Lymphocytes Absolute: 2.8 10*3/uL (ref 0.7–3.1)
Lymphs: 36 %
MCH: 29.3 pg (ref 26.6–33.0)
MCHC: 33.8 g/dL (ref 31.5–35.7)
MCV: 87 fL (ref 79–97)
Monocytes Absolute: 0.7 10*3/uL (ref 0.1–0.9)
Monocytes: 9 %
Neutrophils Absolute: 4.2 10*3/uL (ref 1.4–7.0)
Neutrophils: 53 %
Platelets: 370 10*3/uL (ref 150–450)
RBC: 4.61 x10E6/uL (ref 4.14–5.80)
RDW: 14.4 % (ref 11.6–15.4)
WBC: 7.8 10*3/uL (ref 3.4–10.8)

## 2019-06-03 LAB — COMPREHENSIVE METABOLIC PANEL
ALT: 23 IU/L (ref 0–44)
AST: 21 IU/L (ref 0–40)
Albumin/Globulin Ratio: 2 (ref 1.2–2.2)
Albumin: 4.7 g/dL (ref 3.8–4.9)
Alkaline Phosphatase: 47 IU/L (ref 39–117)
BUN/Creatinine Ratio: 20 (ref 9–20)
BUN: 27 mg/dL — ABNORMAL HIGH (ref 6–24)
Bilirubin Total: 0.3 mg/dL (ref 0.0–1.2)
CO2: 22 mmol/L (ref 20–29)
Calcium: 9.7 mg/dL (ref 8.7–10.2)
Chloride: 101 mmol/L (ref 96–106)
Creatinine, Ser: 1.35 mg/dL — ABNORMAL HIGH (ref 0.76–1.27)
GFR calc Af Amer: 68 mL/min/{1.73_m2} (ref >59)
GFR calc non Af Amer: 59 mL/min/{1.73_m2} — ABNORMAL LOW (ref >59)
Globulin, Total: 2.3 g/dL (ref 1.5–4.5)
Glucose: 233 mg/dL — ABNORMAL HIGH (ref 65–99)
Potassium: 4.1 mmol/L (ref 3.5–5.2)
Sodium: 141 mmol/L (ref 134–144)
Total Protein: 7 g/dL (ref 6.0–8.5)

## 2019-06-03 LAB — HEMOGLOBIN A1C
Est. average glucose Bld gHb Est-mCnc: 160 mg/dL
Hgb A1c MFr Bld: 7.2 % — ABNORMAL HIGH (ref 4.8–5.6)

## 2019-06-16 LAB — HM DIABETES EYE EXAM

## 2019-06-30 ENCOUNTER — Telehealth: Payer: Self-pay

## 2019-06-30 ENCOUNTER — Encounter: Payer: Self-pay | Admitting: Internal Medicine

## 2019-06-30 NOTE — Telephone Encounter (Signed)
Crashing in evening daily and BS is in 60s. Diet changes since wife on [employer] including Wheat noodles and added veggies and fruits more. Major fatigue even after a meal. Will see Endo on 07-20-19 but wife concerned about this. Patient taking all meds listed as prescribed.

## 2019-06-30 NOTE — Telephone Encounter (Signed)
I sent a message through My Chart.

## 2019-07-24 ENCOUNTER — Ambulatory Visit (INDEPENDENT_AMBULATORY_CARE_PROVIDER_SITE_OTHER): Payer: Managed Care, Other (non HMO) | Admitting: Internal Medicine

## 2019-07-24 ENCOUNTER — Other Ambulatory Visit: Payer: Self-pay

## 2019-07-24 VITALS — HR 104 | Ht 70.0 in | Wt 182.0 lb

## 2019-07-24 DIAGNOSIS — R931 Abnormal findings on diagnostic imaging of heart and coronary circulation: Secondary | ICD-10-CM

## 2019-07-24 DIAGNOSIS — E785 Hyperlipidemia, unspecified: Secondary | ICD-10-CM

## 2019-07-24 DIAGNOSIS — E781 Pure hyperglyceridemia: Secondary | ICD-10-CM | POA: Diagnosis not present

## 2019-07-24 DIAGNOSIS — E1100 Type 2 diabetes mellitus with hyperosmolarity without nonketotic hyperglycemic-hyperosmolar coma (NKHHC): Secondary | ICD-10-CM

## 2019-07-24 NOTE — Patient Instructions (Signed)
Medication Instructions:  Your physician recommends that you continue on your current medications as directed. Please refer to the Current Medication list given to you today.  If you need a refill on your cardiac medications before your next appointment, please call your pharmacy.   Lab work: FASTING lab work in 3 months to check cholesterol If you have labs (blood work) drawn today and your tests are completely normal, you will receive your results only by: Marland Kitchen MyChart Message (if you have MyChart) OR . A paper copy in the mail If you have any lab test that is abnormal or we need to change your treatment, we will call you to review the results.  Follow-Up: At [facility], you and your health needs are our priority.  As part of our continuing mission to provide you with exceptional heart care, we have created designated Provider Care Teams.  These Care Teams include your primary Cardiologist (physician) and Advanced Practice Providers (APPs -  Physician Assistants and Nurse Practitioners) who all work together to provide you with the care you need, when you need it. You will need a follow up appointment in 3 months.  Please call our office 2 months in advance to schedule this appointment.  You may see Dr. Debara Pickett or one of the following Advanced Practice Providers on your designated Care Team: Almyra Deforest, [place] . Fabian Sharp, PA-C

## 2019-07-25 ENCOUNTER — Encounter: Payer: Self-pay | Admitting: Internal Medicine

## 2019-07-25 NOTE — Progress Notes (Signed)
LIPID CLINIC CONSULT NOTE  Chief Complaint:  Follow-up dyslipidemia  Primary Care Physician: Glean Hess, MD  Primary Cardiologist:  No primary care provider on file.  HPI:  Jesse Alvarado is a 53 y.o. male who is being seen today for the evaluation of manage dyslipidemia at the request of Glendon Axe, MD.  This is a pleasant 53 year old male was kindly referred for evaluation and management of dyslipidemia.  He is a patient that is followed by my partner Dr. Percival Spanish and recently was noted to have a significantly elevated cholesterol profile.  His total cholesterol was 242 with triglycerides of 267, HDL 37 and LDL of 151.  He currently takes pravastatin 40 mg daily and fenofibrate 145 mg daily. Other medical problems include type 2 diabetes (non-insulin-dependent), chronic kidney disease, GERD, hypertension, OSA on CPAP and nephrolithiasis.  He was noted to have coronary artery calcification on his CT scan and was therefore referred to Dr. Percival Spanish for further evaluation and management.  LDL in October was 118 however most recently LDL was elevated further at 151.  He does report compliance with his medication.  07/24/2019  [name] returns today for follow-up dyslipidemia.  Overall he reports feeling well on medications.  He has had significant improvement in his numbers.  Total cholesterol now 158, triglycerides 96 (decreased from 314) and HDL 38, LDL 101.  Although we have targeted LDL less than 70, he has had significant improvement particularly in triglycerides on Vascepa.  He is also made significant dietary changes as well.  PMHx:  Past Medical History:  Diagnosis Date  . Anemia    taking 3 iron pills a day  . Anemia   . Arthritis   . Asthma    sports induced asthma. takes inhalers when needed  . Cancer (HCC)    Basal cell  . Chronic kidney disease    kidney stones  . Complication of anesthesia    high tolerance to pain medication. body absorbs pain med  quickly  . Cough on exercise   . Diabetes mellitus without complication ([place])   . Diverticulosis   . Erectile dysfunction   . Fatty liver   . GERD (gastroesophageal reflux disease)   . History of kidney stones   . Hyperlipidemia   . Hypertension    per patient, he does not have high bp but is treated for his kidneys and his diabetes  . Kidney stone   . Pancreatitis   . Sleep apnea    use C-PAP  . Wears hearing aid in both ears     Past Surgical History:  Procedure Laterality Date  . CARPAL TUNNEL RELEASE Right 10/03/2018   Procedure: CARPAL TUNNEL RELEASE;  Surgeon: Earnestine Leys, MD;  Location: [facility] ORS;  Service: Orthopedics;  Laterality: Right;  . CARPAL TUNNEL RELEASE Left 10/24/2018   Procedure: CARPAL TUNNEL RELEASE;  Surgeon: Earnestine Leys, MD;  Location: [facility] ORS;  Service: Orthopedics;  Laterality: Left;  . CYSTOSCOPY WITH STENT PLACEMENT Bilateral 06/05/2016   Procedure: CYSTOSCOPY WITH STENT PLACEMENT;  Surgeon: Nickie Retort, MD;  Location: [facility] OR[facility];  Service: Urology;  Laterality: Bilateral;  . CYSTOSCOPY/URETEROSCOPY/HOLMIUM LASER/STENT PLACEMENT Left 04/13/2016   Procedure: CYSTOSCOPY/RETROGRADE PYELOGRAM/URETEROSCOPY WITH HOLMIUM LASER LITHOTRIPSY//STENT PLACEMENT;  Surgeon: Festus Aloe, MD;  Location: [facility] ORS;  Service: Urology;  Laterality: Left;  . ESOPHAGOGASTRODUODENOSCOPY (EGD) WITH PROPOFOL N/A 12/27/2015   Procedure: ESOPHAGOGASTRODUODENOSCOPY (EGD) WITH PROPOFOL;  Surgeon: Manya Silvas, MD;  Location: [facility] ENDOSCOPY;  Service: Endoscopy;  Laterality:  N/A;  . EXTRACORPOREAL SHOCK WAVE LITHOTRIPSY Right 12/30/2017   Procedure: EXTRACORPOREAL SHOCK WAVE LITHOTRIPSY (ESWL);  Surgeon: Royston Cowper, MD;  Location: [facility] ORS;  Service: Urology;  Laterality: Right;  . HERNIA REPAIR  [id number]   umbilical  . KNEE ARTHROSCOPY Right 2015   had bursa sack repaired  . KNEE ARTHROSCOPY WITH MENISCAL REPAIR Right 11/18/2018   Procedure: KNEE ARTHROSCOPY WITH  MENISCAL REPAIR AND CHONDROPLASTY;  Surgeon: Leim Fabry, MD;  Location: [place];  Service: Orthopedics;  Laterality: Right;  Diabetic - oral meds sleep apnea SUPINE WITH ACL LEG HOLDER SMITH AND NEWPHEW CETERIX  . PREPATELLAR BURSA EXCISION Left 1993   and ostetomy. had at least 5 surgeries in 15 years  . SHOULDER SURGERY Right 2011   tendon was shredded and was trimmed, repaired and reattached. Screws in shoulder  . URETEROSCOPY WITH HOLMIUM LASER LITHOTRIPSY Bilateral 06/05/2016   Procedure: URETEROSCOPY WITH HOLMIUM LASER LITHOTRIPSY;  Surgeon: Nickie Retort, MD;  Location: [facility] ORS;  Service: Urology;  Laterality: Bilateral;  . VASECTOMY  2002    FAMHx:  Family History  Problem Relation Age of Onset  . Urolithiasis Father   . Kidney disease Father   . Prostate cancer Neg Hx   . Kidney cancer Neg Hx     SOCHx:   reports that he has never smoked. He has never used smokeless tobacco. He reports that he does not drink alcohol or use drugs.  ALLERGIES:  Allergies  Allergen Reactions  . Dulaglutide Other (See Comments)    Trulicity- Caused pancreatitis   . Monosodium Glutamate Diarrhea    MSG  . Ciprofloxacin Other (See Comments)    GI upset    ROS: Pertinent items noted in HPI and remainder of comprehensive ROS otherwise negative.  HOME MEDS: Current Outpatient Medications on File Prior to Visit  Medication Sig Dispense Refill  . acetaminophen (TYLENOL) 500 MG tablet Take 2 tablets (1,000 mg total) by mouth every 8 (eight) hours. 90 tablet 2  . amitriptyline (ELAVIL) 25 MG tablet Take 25 mg by mouth at bedtime. Recurring Cough- Dr Joya Gaskins-  11  . aspirin EC 81 MG tablet Take 81 mg by mouth daily.    . Avanafil (STENDRA) 200 MG TABS Take 200 mg by mouth daily as needed (erectile dysfunction). Brand name [place]    . Azelastine-Fluticasone (DYMISTA) 137-50 MCG/ACT SUSP Place 1 spray into both nostrils 2 (two) times daily.     . benzonatate (TESSALON) 200  MG capsule Take 200 mg by mouth 3 (three) times daily as needed for cough.    . calcium citrate (CALCITRATE - DOSED IN MG ELEMENTAL CALCIUM) 950 MG tablet Take 200 mg of elemental calcium by mouth 3 (three) times daily.    . ciclopirox (PENLAC) 8 % solution Apply 1 application topically at bedtime. Apply over nail and surrounding skin. Apply daily over previous coat. After seven (7) days, may remove with alcohol and continue cycle. 6.6 mL 5  . cyanocobalamin (,VITAMIN B-12,) 1000 MCG/ML injection Inject 1 mL (1,000 mcg total) into the muscle every 30 (thirty) days. 1 mL 5  . dapagliflozin propanediol (FARXIGA) 10 MG TABS tablet Take 10 mg by mouth daily.    . fenofibrate (TRICOR) 145 MG tablet Take 145 mg by mouth daily.     . ferrous gluconate (FERGON) 324 MG tablet Take 1 tablet (324 mg total) by mouth 3 (three) times daily with meals. [id number] tablet 1  . folic acid (FOLVITE) 1 MG tablet  Take 1 tablet (1 mg total) by mouth daily. 90 tablet 1  . gabapentin (NEURONTIN) 100 MG capsule Take 1 capsule by mouth 3 (three) times daily.    Marland Kitchen glimepiride (AMARYL) 2 MG tablet Take 4 tablets (8 mg total) by mouth See admin instructions. Take 2mg  with breakfast, 2mg  with lunch, and 4mg  with supper. (Patient taking differently: Take 8 mg by mouth 2 (two) times daily. ) 360 tablet 1  . Icosapent Ethyl (VASCEPA) 1 g CAPS Take 2 capsules (2 g total) by mouth 2 (two) times daily. 120 capsule 11  . ipratropium (ATROVENT) 0.06 % nasal spray Place 2 sprays into both nostrils 2 (two) times daily.   3  . ketoconazole (NIZORAL) 2 % shampoo WASH SCALP EVERY OTHER WASHING ALTERNATING WITH HEAD AND SHOULDERS. LET SIT SEVERAL MINUTES BEFORE RINSING.    Marland Kitchen levocetirizine (XYZAL) 5 MG tablet Take 5 mg by mouth every evening.   11  . losartan (COZAAR) 50 MG tablet Take 1 tablet (50 mg total) by mouth daily. 90 tablet 1  . metFORMIN (GLUCOPHAGE) 850 MG tablet Take 1 tablet (850 mg total) by mouth 3 (three) times daily with meals. 270  tablet 1  . Multiple Vitamin (MULTI-VITAMINS) TABS Take 1 tablet by mouth 3 (three) times daily.     . pantoprazole (PROTONIX) 40 MG tablet Take 1 tablet (40 mg total) by mouth 2 (two) times daily with a meal. 180 tablet 1  . pioglitazone (ACTOS) 30 MG tablet Take 1 tablet (30 mg total) by mouth daily. 90 tablet 1  . PROAIR RESPICLICK [id number] (90 Base) MCG/ACT AEPB Inhale 2 puffs into the lungs every 6 (six) hours as needed (shortness of breath).   0  . sucralfate (CARAFATE) 1 g tablet Take 1 tablet (1 g total) by mouth 3 (three) times daily with meals. 270 tablet 1  . SYMBICORT 160-4.5 MCG/ACT inhaler Inhale 2 puffs into the lungs 2 (two) times daily.   3  . tiZANidine (ZANAFLEX) 2 MG tablet Take 2-4 mg by mouth at bedtime as needed for muscle spasms.     . traMADol (ULTRAM) 50 MG tablet Take 50 mg by mouth every 6 (six) hours as needed.    . vitamin C (ASCORBIC ACID) 500 MG tablet Take 500 mg by mouth daily.    Marland Kitchen atorvastatin (LIPITOR) 80 MG tablet Take 1 tablet (80 mg total) by mouth daily. 90 tablet 3   No current facility-administered medications on file prior to visit.     LABS/IMAGING: No results found for this or any previous visit (from the past 48 hour(s)). No results found.  LIPID PANEL:    Component Value Date/Time   CHOL 158 04/21/2019 0834   TRIG 96 04/21/2019 0834   HDL 38 (L) 04/21/2019 0834   CHOLHDL 4.2 04/21/2019 0834   CHOLHDL 5.9 07/30/2018 0513   VLDL 63 (H) 07/30/2018 0513   LDLCALC 101 (H) 04/21/2019 0834    WEIGHTS: Wt Readings from Last 3 Encounters:  07/24/19 182 lb (82.6 kg)  06/02/19 181 lb (82.1 kg)  12/27/18 183 lb 12.8 oz (83.4 kg)    VITALS: Pulse (!) 104   Ht 5\' 10"  (1.778 m)   Wt 182 lb (82.6 kg)   BMI 26.11 kg/m   EXAM: Deferred  EKG: Deferred  ASSESSMENT: 1. Mixed dyslipidemia 2. High coronary artery calcium score 3. Essential hypertension 4. Type 2 diabetes (not on insulin)  PLAN: 1.   Mr. Brandsma has had significant reduction  in triglycerides  now on Vascepa and overall has a improved lipid profile although LDL remains above goal less than 70.  We discussed the possibility of adding ezetimibe to his therapy however he would like to continue to work on diet and exercise over the next 6 months and if he does not reach that target, I would recommend additional therapy.  We will plan to repeat lipid profile in 6 months and follow-up at that time.  Pixie Casino, MD, [facility], [place] Director of the [facility]  [facility] Diplomate of the American Board of Clinical Lipidology Attending Cardiologist  Direct Dial: [phone]  Fax: [phone]  Website:  [email or web address] 07/25/2019, 6:06 PM

## 2019-08-25 ENCOUNTER — Other Ambulatory Visit: Payer: Self-pay

## 2019-08-25 ENCOUNTER — Other Ambulatory Visit
Admission: RE | Admit: 2019-08-25 | Discharge: 2019-08-25 | Disposition: A | Discharge status: Home / Self Care | Payer: Managed Care, Other (non HMO) | Source: Ambulatory Visit | Attending: Internal Medicine | Admitting: Internal Medicine

## 2019-08-25 DIAGNOSIS — Z20828 Contact with and (suspected) exposure to other viral communicable diseases: Secondary | ICD-10-CM | POA: Insufficient documentation

## 2019-08-25 DIAGNOSIS — Z01812 Encounter for preprocedural laboratory examination: Secondary | ICD-10-CM | POA: Diagnosis present

## 2019-08-25 LAB — SARS CORONAVIRUS 2 (TAT 6-24 HRS): SARS Coronavirus 2: NEGATIVE

## 2019-08-29 ENCOUNTER — Encounter: Payer: Self-pay | Admitting: *Deleted

## 2019-08-30 ENCOUNTER — Ambulatory Visit
Admission: RE | Admit: 2019-08-30 | Discharge: 2019-08-30 | Disposition: A | Discharge status: Home / Self Care | Payer: Managed Care, Other (non HMO) | Attending: Internal Medicine | Admitting: Internal Medicine

## 2019-08-30 ENCOUNTER — Encounter
Admission: RE | Disposition: A | Discharge status: Home / Self Care | Payer: Self-pay | Source: Home / Self Care | Attending: Internal Medicine

## 2019-08-30 ENCOUNTER — Encounter: Payer: Self-pay | Admitting: *Deleted

## 2019-08-30 ENCOUNTER — Ambulatory Visit: Payer: Managed Care, Other (non HMO) | Admitting: Certified Registered"

## 2019-08-30 ENCOUNTER — Other Ambulatory Visit: Payer: Self-pay

## 2019-08-30 DIAGNOSIS — I129 Hypertensive chronic kidney disease with stage 1 through stage 4 chronic kidney disease, or unspecified chronic kidney disease: Secondary | ICD-10-CM | POA: Insufficient documentation

## 2019-08-30 DIAGNOSIS — Z7982 Long term (current) use of aspirin: Secondary | ICD-10-CM | POA: Insufficient documentation

## 2019-08-30 DIAGNOSIS — K760 Fatty (change of) liver, not elsewhere classified: Secondary | ICD-10-CM | POA: Insufficient documentation

## 2019-08-30 DIAGNOSIS — E785 Hyperlipidemia, unspecified: Secondary | ICD-10-CM | POA: Insufficient documentation

## 2019-08-30 DIAGNOSIS — Z79899 Other long term (current) drug therapy: Secondary | ICD-10-CM | POA: Diagnosis not present

## 2019-08-30 DIAGNOSIS — K2100 Gastro-esophageal reflux disease with esophagitis, without bleeding: Secondary | ICD-10-CM | POA: Diagnosis not present

## 2019-08-30 DIAGNOSIS — K641 Second degree hemorrhoids: Secondary | ICD-10-CM | POA: Insufficient documentation

## 2019-08-30 DIAGNOSIS — Z7984 Long term (current) use of oral hypoglycemic drugs: Secondary | ICD-10-CM | POA: Insufficient documentation

## 2019-08-30 DIAGNOSIS — G4730 Sleep apnea, unspecified: Secondary | ICD-10-CM | POA: Diagnosis not present

## 2019-08-30 DIAGNOSIS — E1122 Type 2 diabetes mellitus with diabetic chronic kidney disease: Secondary | ICD-10-CM | POA: Insufficient documentation

## 2019-08-30 DIAGNOSIS — K5730 Diverticulosis of large intestine without perforation or abscess without bleeding: Secondary | ICD-10-CM | POA: Diagnosis not present

## 2019-08-30 DIAGNOSIS — D631 Anemia in chronic kidney disease: Secondary | ICD-10-CM | POA: Diagnosis not present

## 2019-08-30 DIAGNOSIS — N189 Chronic kidney disease, unspecified: Secondary | ICD-10-CM | POA: Diagnosis not present

## 2019-08-30 DIAGNOSIS — M1990 Unspecified osteoarthritis, unspecified site: Secondary | ICD-10-CM | POA: Insufficient documentation

## 2019-08-30 DIAGNOSIS — Z800 Family history of malignant neoplasm of digestive organs: Secondary | ICD-10-CM | POA: Diagnosis present

## 2019-08-30 DIAGNOSIS — Z888 Allergy status to other drugs, medicaments and biological substances status: Secondary | ICD-10-CM | POA: Insufficient documentation

## 2019-08-30 DIAGNOSIS — N529 Male erectile dysfunction, unspecified: Secondary | ICD-10-CM | POA: Diagnosis not present

## 2019-08-30 DIAGNOSIS — Z7951 Long term (current) use of inhaled steroids: Secondary | ICD-10-CM | POA: Diagnosis not present

## 2019-08-30 DIAGNOSIS — Z881 Allergy status to other antibiotic agents status: Secondary | ICD-10-CM | POA: Insufficient documentation

## 2019-08-30 DIAGNOSIS — Z87442 Personal history of urinary calculi: Secondary | ICD-10-CM | POA: Diagnosis not present

## 2019-08-30 DIAGNOSIS — Z1211 Encounter for screening for malignant neoplasm of colon: Secondary | ICD-10-CM | POA: Insufficient documentation

## 2019-08-30 DIAGNOSIS — K2270 Barrett's esophagus without dysplasia: Secondary | ICD-10-CM | POA: Diagnosis present

## 2019-08-30 DIAGNOSIS — J45909 Unspecified asthma, uncomplicated: Secondary | ICD-10-CM | POA: Diagnosis not present

## 2019-08-30 DIAGNOSIS — Z85828 Personal history of other malignant neoplasm of skin: Secondary | ICD-10-CM | POA: Diagnosis not present

## 2019-08-30 HISTORY — PX: ESOPHAGOGASTRODUODENOSCOPY (EGD) WITH PROPOFOL: SHX5813

## 2019-08-30 HISTORY — PX: COLONOSCOPY WITH PROPOFOL: SHX5780

## 2019-08-30 HISTORY — DX: Barrett's esophagus without dysplasia: K22.70

## 2019-08-30 HISTORY — DX: Unspecified injury of right shoulder and upper arm, initial encounter: S49.91XA

## 2019-08-30 LAB — GLUCOSE, CAPILLARY: Glucose-Capillary: 97 mg/dL (ref 70–99)

## 2019-08-30 SURGERY — COLONOSCOPY WITH PROPOFOL
Anesthesia: General

## 2019-08-30 MED ORDER — SODIUM CHLORIDE 0.9 % IV SOLN
INTRAVENOUS | Status: DC
  Administered 2019-08-30: 13:00:00 via INTRAVENOUS

## 2019-08-30 MED ORDER — PROPOFOL 10 MG/ML IV BOLUS
INTRAVENOUS | Status: DC | PRN
  Administered 2019-08-30 (×3): 20 mg via INTRAVENOUS

## 2019-08-30 MED ORDER — PHENYLEPHRINE HCL (PRESSORS) 10 MG/ML IV SOLN
INTRAVENOUS | Status: DC | PRN
  Administered 2019-08-30: 50 ug via INTRAVENOUS

## 2019-08-30 MED ORDER — PROPOFOL 500 MG/50ML IV EMUL
INTRAVENOUS | Status: DC | PRN
  Administered 2019-08-30: 200 ug/kg/min via INTRAVENOUS

## 2019-08-30 MED ORDER — LIDOCAINE HCL (PF) 2 % IJ SOLN
INTRAMUSCULAR | Status: DC | PRN
  Administered 2019-08-30: 50 mg via INTRADERMAL

## 2019-08-30 MED ORDER — DEXMEDETOMIDINE HCL 200 MCG/2ML IV SOLN
INTRAVENOUS | Status: DC | PRN
  Administered 2019-08-30 (×2): 4 ug via INTRAVENOUS

## 2019-08-30 MED ORDER — PROPOFOL 500 MG/50ML IV EMUL
INTRAVENOUS | Status: AC
  Filled 2019-08-30: qty 50

## 2019-08-30 NOTE — Anesthesia Post-op Follow-up Note (Signed)
Anesthesia QCDR form completed.        

## 2019-08-30 NOTE — Anesthesia Preprocedure Evaluation (Signed)
Anesthesia Evaluation    History of Anesthesia Complications (+) history of anesthetic complications  Airway Mallampati: II       Dental  (+) Poor Dentition, Caps, Dental Advidsory Given, Missing   Pulmonary neg shortness of breath, asthma , sleep apnea and Continuous Positive Airway Pressure Ventilation , neg COPD, neg recent URI,           Cardiovascular Exercise Tolerance: Good hypertension, On Medications (-) angina(-) CAD, (-) Past MI, (-) Cardiac Stents and (-) CABG (-) dysrhythmias (-) Valvular Problems/Murmurs Rhythm:regular Rate:Normal     Neuro/Psych negative neurological ROS     GI/Hepatic GERD  Medicated,NAFLD   Endo/Other  diabetes  Renal/GU Renal disease (kidney stones)     Musculoskeletal   Abdominal   Peds  Hematology  (+) Blood dyscrasia, anemia ,   Anesthesia Other Findings Past Medical History: No date: Anemia     Comment:  taking 3 iron pills a day No date: Anemia No date: Arthritis No date: Asthma     Comment:  sports induced asthma. takes inhalers when needed No date: Cancer [facility])     Comment:  Basal cell No date: Chronic kidney disease     Comment:  kidney stones No date: Complication of anesthesia     Comment:  high tolerance to pain medication. body absorbs pain med              quickly No date: Cough on exercise No date: Diabetes mellitus without complication (HCC) No date: Diverticulosis No date: Erectile dysfunction No date: Fatty liver No date: GERD (gastroesophageal reflux disease) No date: History of kidney stones No date: Hyperlipidemia No date: Hypertension     Comment:  per patient, he does not have high bp but is treated for              his kidneys and his diabetes No date: Kidney stone No date: Pancreatitis No date: Sleep apnea     Comment:  use C-PAP   Reproductive/Obstetrics                             Anesthesia Physical  Anesthesia  Plan  ASA: III  Anesthesia Plan: General   Post-op Pain Management:    Induction: Intravenous  PONV Risk Score and Plan: 2 and Propofol infusion and TIVA  Airway Management Planned: Natural Airway and Nasal Cannula  Additional Equipment:   Intra-op Plan:   Post-operative Plan: Extubation in OR  Informed Consent: I have reviewed the patients History and Physical, chart, labs and discussed the procedure including the risks, benefits and alternatives for the proposed anesthesia with the patient or authorized representative who has indicated his/her understanding and acceptance.       Plan Discussed with:   Anesthesia Plan Comments:         Anesthesia Quick Evaluation

## 2019-08-30 NOTE — Anesthesia Postprocedure Evaluation (Signed)
Anesthesia Post Note  Patient: Jesse Alvarado  Procedure(s) Performed: COLONOSCOPY WITH PROPOFOL (N/A ) ESOPHAGOGASTRODUODENOSCOPY (EGD) WITH PROPOFOL (N/A )  Patient location during evaluation: Endoscopy Anesthesia Type: General Level of consciousness: awake and alert Pain management: pain level controlled Vital Signs Assessment: post-procedure vital signs reviewed and stable Respiratory status: spontaneous breathing, nonlabored ventilation, respiratory function stable and patient connected to nasal cannula oxygen Cardiovascular status: blood pressure returned to baseline and stable Postop Assessment: no apparent nausea or vomiting Anesthetic complications: no     Last Vitals:  Vitals:   08/30/19 1409 08/30/19 1419  BP: 105/73 101/76  Pulse: 82 76  Resp: 15 14  Temp:    SpO2: 94% 96%    Last Pain:  Vitals:   08/30/19 1419  TempSrc:   PainSc: 0-No pain                 Martha Clan

## 2019-08-30 NOTE — Op Note (Signed)
[facility] Gastroenterology Patient Name: Carole Granillo Procedure Date: 08/30/2019 1:20 PM MRN: [id number] Account #: [id number] Date of Birth: [birth year] Admit Type: Outpatient Age: 53 Room: [facility] ENDO ROOM 3 Gender: Male Note Status: Finalized Procedure:            Upper GI endoscopy Indications:          Surveillance for malignancy due to personal history of                        Barrett's esophagus Providers:            Benay Pike. Bevely Hackbart MD, MD Medicines:            Propofol per Anesthesia Complications:        No immediate complications. Procedure:            Pre-Anesthesia Assessment:                       - The risks and benefits of the procedure and the                        sedation options and risks were discussed with the                        patient. All questions were answered and informed                        consent was obtained.                       - Patient identification and proposed procedure were                        verified prior to the procedure by the nurse. The                        procedure was verified in the procedure room.                       - ASA Grade Assessment: III - A patient with severe                        systemic disease.                       - After reviewing the risks and benefits, the patient                        was deemed in satisfactory condition to undergo the                        procedure.                       After obtaining informed consent, the endoscope was                        passed under direct vision. Throughout the procedure,                        the patient's blood pressure, pulse, and oxygen  saturations were monitored continuously. The Endoscope                        was introduced through the mouth, and advanced to the                        third part of duodenum. The upper GI endoscopy was                        accomplished without difficulty. The  patient tolerated                        the procedure well. Findings:      There were esophageal mucosal changes secondary to established Barrett's       disease present in the distal esophagus. The maximum longitudinal extent       of these mucosal changes was 0.5 cm in length. Mucosa was biopsied with       a cold forceps for histology in 4 quadrants at the gastroesophageal       junction. One specimen bottle was sent to pathology.      The stomach was normal.      The examined duodenum was normal.      The exam was otherwise without abnormality. Impression:           - Esophageal mucosal changes secondary to established                        Barrett's disease. Biopsied.                       - Normal stomach.                       - Normal examined duodenum.                       - The examination was otherwise normal. Recommendation:       - Await pathology results.                       - Proceed with colonoscopy Procedure Code(s):    --- Professional ---                       [phone], Esophagogastroduodenoscopy, flexible, transoral;                        with biopsy, single or multiple Diagnosis Code(s):    --- Professional ---                       K22.70, Barrett's esophagus without dysplasia CPT copyright 2019 American Medical Association. All rights reserved. The codes documented in this report are preliminary and upon coder review may  be revised to meet current compliance requirements. Efrain Sella MD, MD 08/30/2019 1:43:47 PM This report has been signed electronically. Number of Addenda: 0 Note Initiated On: 08/30/2019 1:20 PM Estimated Blood Loss: Estimated blood loss: none.      [facility]

## 2019-08-30 NOTE — Op Note (Signed)
[facility] Gastroenterology Patient Name: Jesse Alvarado Procedure Date: 08/30/2019 1:20 PM MRN: [id number] Account #: [id number] Date of Birth: [birth year] Admit Type: Outpatient Age: 53 Room: [facility] ENDO ROOM 3 Gender: Male Note Status: Finalized Procedure:            Colonoscopy Indications:          Screening in patient at increased risk: Family history                        of 1st-degree relative with colorectal cancer Providers:            Benay Pike. [place] MD, MD Medicines:            Propofol per Anesthesia Complications:        No immediate complications. Procedure:            Pre-Anesthesia Assessment:                       - The risks and benefits of the procedure and the                        sedation options and risks were discussed with the                        patient. All questions were answered and informed                        consent was obtained.                       - Patient identification and proposed procedure were                        verified prior to the procedure by the nurse. The                        procedure was verified in the procedure room.                       - ASA Grade Assessment: II - A patient with mild                        systemic disease.                       - After reviewing the risks and benefits, the patient                        was deemed in satisfactory condition to undergo the                        procedure.                       After obtaining informed consent, the colonoscope was                        passed under direct vision. Throughout the procedure,                        the patient's blood pressure, pulse, and oxygen  saturations were monitored continuously. The                        Colonoscope was introduced through the anus and                        advanced to the the cecum, identified by appendiceal                        orifice and ileocecal valve. The  colonoscopy was                        performed without difficulty. The patient tolerated the                        procedure well. The quality of the bowel preparation                        was adequate. The ileocecal valve, appendiceal orifice,                        and rectum were photographed. Findings:      The perianal and digital rectal examinations were normal. Pertinent       negatives include normal sphincter tone and no palpable rectal lesions.      Many small and large-mouthed diverticula were found in the left colon.      Non-bleeding internal hemorrhoids were found during retroflexion. The       hemorrhoids were Grade II (internal hemorrhoids that prolapse but reduce       spontaneously). Estimated blood loss: none.      The exam was otherwise without abnormality. Impression:           - Diverticulosis in the left colon.                       - Non-bleeding internal hemorrhoids.                       - The examination was otherwise normal.                       - No specimens collected. Recommendation:       - Await pathology results from EGD, also performed                        today.                       - Patient has a contact number available for                        emergencies. The signs and symptoms of potential                        delayed complications were discussed with the patient.                        Return to normal activities tomorrow. Written discharge                        instructions were provided to the patient.                       -  Advance diet as tolerated.                       - Use Phazyme PO as directed.                       - Refer to surgeon for hemorrhoid banding at                        appointment to be scheduled. Procedure Code(s):    --- Professional ---                       [id number], Colorectal cancer screening; colonoscopy on                        individual at high risk Diagnosis Code(s):    --- Professional ---                        K57.30, Diverticulosis of large intestine without                        perforation or abscess without bleeding                       K64.0, First degree hemorrhoids                       Z80.0, Family history of malignant neoplasm of                        digestive organs CPT copyright 2019 American Medical Association. All rights reserved. The codes documented in this report are preliminary and upon coder review may  be revised to meet current compliance requirements. Efrain Sella MD, MD 08/30/2019 1:59:21 PM This report has been signed electronically. Number of Addenda: 0 Note Initiated On: 08/30/2019 1:20 PM Scope Withdrawal Time: 0 hours 6 minutes 13 seconds  Total Procedure Duration: 0 hours 8 minutes 48 seconds  Estimated Blood Loss: Estimated blood loss: none. Estimated blood loss: none.      [facility]

## 2019-08-30 NOTE — H&P (Signed)
Outpatient short stay form Pre-procedure 08/30/2019 12:45 PM Nochum Fenter K. Alice Reichert, M.D.  Primary Physician: Halina Maidens, M.D.  Reason for visit:  Family hx of colon cancer, personal hx of Barrett's esophagus - 2017  History of present illness:  Patient has hx of Barrett's without dysplasia on EGD in 257.  53 year old patient presenting for family history of colon cancer. Patient denies any change in bowel habits, rectal bleeding or involuntary weight loss.   No current facility-administered medications for this encounter.   Medications Prior to Admission  Medication Sig Dispense Refill Last Dose  . meloxicam (MOBIC) 15 MG tablet Take 15 mg by mouth as needed for pain.     Marland Kitchen acetaminophen (TYLENOL) 500 MG tablet Take 2 tablets (1,000 mg total) by mouth every 8 (eight) hours. 90 tablet 2   . amitriptyline (ELAVIL) 25 MG tablet Take 25 mg by mouth at bedtime. Recurring Cough- Dr Joya Gaskins-  11   . aspirin EC 81 MG tablet Take 81 mg by mouth daily.     Marland Kitchen atorvastatin (LIPITOR) 80 MG tablet Take 1 tablet (80 mg total) by mouth daily. 90 tablet 3   . Avanafil (STENDRA) 200 MG TABS Take 200 mg by mouth daily as needed (erectile dysfunction). Brand name [place]     . Azelastine-Fluticasone (DYMISTA) 137-50 MCG/ACT SUSP Place 1 spray into both nostrils 2 (two) times daily.      . benzonatate (TESSALON) 200 MG capsule Take 200 mg by mouth 3 (three) times daily as needed for cough.     . calcium citrate (CALCITRATE - DOSED IN MG ELEMENTAL CALCIUM) 950 MG tablet Take 200 mg of elemental calcium by mouth 3 (three) times daily.     . ciclopirox (PENLAC) 8 % solution Apply 1 application topically at bedtime. Apply over nail and surrounding skin. Apply daily over previous coat. After seven (7) days, may remove with alcohol and continue cycle. 6.6 mL 5   . cyanocobalamin (,VITAMIN B-12,) 1000 MCG/ML injection Inject 1 mL (1,000 mcg total) into the muscle every 30 (thirty) days. 1 mL 5   . dapagliflozin  propanediol (FARXIGA) 10 MG TABS tablet Take 10 mg by mouth daily.     . fenofibrate (TRICOR) 145 MG tablet Take 145 mg by mouth daily.      . ferrous gluconate (FERGON) 324 MG tablet Take 1 tablet (324 mg total) by mouth 3 (three) times daily with meals. [id number] tablet 1   . folic acid (FOLVITE) 1 MG tablet Take 1 tablet (1 mg total) by mouth daily. 90 tablet 1   . gabapentin (NEURONTIN) 100 MG capsule Take 1 capsule by mouth 3 (three) times daily.     Marland Kitchen glimepiride (AMARYL) 2 MG tablet Take 4 tablets (8 mg total) by mouth See admin instructions. Take 2mg  with breakfast, 2mg  with lunch, and 4mg  with supper. (Patient taking differently: Take 8 mg by mouth 2 (two) times daily. ) 360 tablet 1   . Icosapent Ethyl (VASCEPA) 1 g CAPS Take 2 capsules (2 g total) by mouth 2 (two) times daily. 120 capsule 11   . ipratropium (ATROVENT) 0.06 % nasal spray Place 2 sprays into both nostrils 2 (two) times daily.   3   . ketoconazole (NIZORAL) 2 % shampoo WASH SCALP EVERY OTHER WASHING ALTERNATING WITH HEAD AND SHOULDERS. LET SIT SEVERAL MINUTES BEFORE RINSING.     Marland Kitchen levocetirizine (XYZAL) 5 MG tablet Take 5 mg by mouth every evening.   11   . losartan (COZAAR)  50 MG tablet Take 1 tablet (50 mg total) by mouth daily. 90 tablet 1   . metFORMIN (GLUCOPHAGE) 850 MG tablet Take 1 tablet (850 mg total) by mouth 3 (three) times daily with meals. 270 tablet 1   . Multiple Vitamin (MULTI-VITAMINS) TABS Take 1 tablet by mouth 3 (three) times daily.      . pantoprazole (PROTONIX) 40 MG tablet Take 1 tablet (40 mg total) by mouth 2 (two) times daily with a meal. 180 tablet 1   . pioglitazone (ACTOS) 30 MG tablet Take 1 tablet (30 mg total) by mouth daily. 90 tablet 1   . PROAIR RESPICLICK 123XX123 (90 Base) MCG/ACT AEPB Inhale 2 puffs into the lungs every 6 (six) hours as needed (shortness of breath).   0   . sucralfate (CARAFATE) 1 g tablet Take 1 tablet (1 g total) by mouth 3 (three) times daily with meals. 270 tablet 1   .  SYMBICORT 160-4.5 MCG/ACT inhaler Inhale 2 puffs into the lungs 2 (two) times daily.   3   . tiZANidine (ZANAFLEX) 2 MG tablet Take 2-4 mg by mouth at bedtime as needed for muscle spasms.      . traMADol (ULTRAM) 50 MG tablet Take 50 mg by mouth every 6 (six) hours as needed.     . vitamin C (ASCORBIC ACID) 500 MG tablet Take 500 mg by mouth daily.        Allergies  Allergen Reactions  . Dulaglutide Other (See Comments)    Trulicity- Caused pancreatitis   . Monosodium Glutamate Diarrhea    MSG  . Ciprofloxacin Other (See Comments)    GI upset     Past Medical History:  Diagnosis Date  . Anemia    taking 3 iron pills a day  . Anemia   . Arthritis   . Asthma    sports induced asthma. takes inhalers when needed  . Barrett's esophagus without dysplasia   . Cancer (HCC)    Basal cell  . Chronic kidney disease    kidney stones  . Complication of anesthesia    high tolerance to pain medication. body absorbs pain med quickly  . Cough on exercise   . Diabetes mellitus without complication ([place])   . Diverticulosis   . Erectile dysfunction   . Fatty liver   . GERD (gastroesophageal reflux disease)   . History of kidney stones   . Hyperlipidemia   . Hypertension    per patient, he does not have high bp but is treated for his kidneys and his diabetes  . Kidney stone   . Pancreatitis   . Right shoulder injury   . Sleep apnea    use C-PAP  . Wears hearing aid in both ears     Review of systems:  Otherwise negative.    Physical Exam  Gen: Alert, oriented. Appears stated age.  HEENT: [place]/AT. PERRLA. Lungs: CTA, no wheezes. CV: RR nl S1, S2. Abd: soft, benign, no masses. BS+ Ext: No edema. Pulses 2+    Planned procedures: Proceed with EGD and colonoscopy. The patient understands the nature of the planned procedure, indications, risks, alternatives and potential complications including but not limited to bleeding, infection, perforation, damage to internal organs and  possible oversedation/side effects from anesthesia. The patient agrees and gives consent to proceed.  Please refer to procedure notes for findings, recommendations and patient disposition/instructions.     Dyron Kawano K. Alice Reichert, M.D. Gastroenterology 08/30/2019  12:45 PM

## 2019-08-30 NOTE — Transfer of Care (Signed)
Immediate Anesthesia Transfer of Care Note  Patient: Jesse Alvarado  Procedure(s) Performed: COLONOSCOPY WITH PROPOFOL (N/A ) ESOPHAGOGASTRODUODENOSCOPY (EGD) WITH PROPOFOL (N/A )  Patient Location: PACU  Anesthesia Type:MAC  Level of Consciousness: drowsy  Airway & Oxygen Therapy: Patient Spontanous Breathing  Post-op Assessment: Report given to RN and Post -op Vital signs reviewed and stable  Post vital signs: Reviewed and stable  Last Vitals:  Vitals Value Taken Time  BP 102/70 08/30/19 1359  Temp 36 C 08/30/19 1359  Pulse 79 08/30/19 1359  Resp 15 08/30/19 1359  SpO2 100 % 08/30/19 1359  Vitals shown include unvalidated device data.  Last Pain:  Vitals:   08/30/19 1359  TempSrc: Tympanic  PainSc: Asleep         Complications: No apparent anesthesia complications patient still with functioning supernova in use

## 2019-08-30 NOTE — Interval H&P Note (Signed)
History and Physical Interval Note:  08/30/2019 12:47 PM  Jesse Alvarado  has presented today for surgery, with the diagnosis of [place], Barrett's.  The various methods of treatment have been discussed with the patient and family. After consideration of risks, benefits and other options for treatment, the patient has consented to  Procedure(s): COLONOSCOPY WITH PROPOFOL (N/A) ESOPHAGOGASTRODUODENOSCOPY (EGD) WITH PROPOFOL (N/A) as a surgical intervention.  The patient's history has been reviewed, patient examined, no change in status, stable for surgery.  I have reviewed the patient's chart and labs.  Questions were answered to the patient's satisfaction.     [place], [place]

## 2019-09-01 LAB — SURGICAL PATHOLOGY

## 2019-09-07 ENCOUNTER — Telehealth: Payer: Self-pay

## 2019-09-07 NOTE — Telephone Encounter (Signed)
Patient wife called saying she is concerned about her husband and their marriage. Husband is having issues of E.D. that he is embarrassed to talk about it. It is also effecting their relationship emotionally. She feels "shut out, and rejected."   She wanted me to document our conversation in his chart because patient may come in at a future appt and discuss. They are currently trying to address these issues with urology- Dr. Rogers Blocker.  FYI.

## 2019-09-25 ENCOUNTER — Encounter: Payer: Self-pay | Admitting: Internal Medicine

## 2019-09-25 ENCOUNTER — Ambulatory Visit (INDEPENDENT_AMBULATORY_CARE_PROVIDER_SITE_OTHER): Payer: Managed Care, Other (non HMO) | Admitting: Internal Medicine

## 2019-09-25 ENCOUNTER — Other Ambulatory Visit: Payer: Self-pay

## 2019-09-25 VITALS — BP 124/76 | HR 90 | Ht 68.0 in | Wt 186.0 lb

## 2019-09-25 DIAGNOSIS — Z1159 Encounter for screening for other viral diseases: Secondary | ICD-10-CM | POA: Diagnosis not present

## 2019-09-25 DIAGNOSIS — I10 Essential (primary) hypertension: Secondary | ICD-10-CM

## 2019-09-25 DIAGNOSIS — E118 Type 2 diabetes mellitus with unspecified complications: Secondary | ICD-10-CM

## 2019-09-25 NOTE — Progress Notes (Signed)
Date:  09/25/2019   Name:  Jesse Alvarado   DOB:  1966/03/08   MRN:  [id number]   Chief Complaint: Diabetes (4 month follow up.) and Hypertension Pt has been referred to Dr. Honor Junes for DM - his appt is next month.  Diabetes He presents for his follow-up diabetic visit. He has type 2 diabetes mellitus. His disease course has been stable. Pertinent negatives for hypoglycemia include no headaches or tremors. Pertinent negatives for diabetes include no chest pain, no fatigue, no polydipsia and no polyuria. Symptoms are stable. Current diabetic treatments: farxiga and metformin and actos and glimepiride. He is compliant with treatment all of the time. He monitors blood glucose at home 1-2 x per day. His breakfast blood glucose is taken between 6-7 am. His breakfast blood glucose range is generally 90-110 mg/dl. His dinner blood glucose is taken between 6-7 pm. His dinner blood glucose range is generally 90-110 mg/dl. An ACE inhibitor/angiotensin II receptor blocker is being taken.  Hypertension This is a chronic problem. The problem is controlled. Pertinent negatives include no chest pain, headaches, palpitations or shortness of breath. Past treatments include angiotensin blockers. The current treatment provides significant improvement. There are no compliance problems.     Lab Results  Component Value Date   CREATININE 1.35 (H) 06/02/2019   BUN 27 (H) 06/02/2019   NA 141 06/02/2019   K 4.1 06/02/2019   CL 101 06/02/2019   CO2 22 06/02/2019   Lab Results  Component Value Date   CHOL 158 04/21/2019   HDL 38 (L) 04/21/2019   LDLCALC 101 (H) 04/21/2019   TRIG 96 04/21/2019   CHOLHDL 4.2 04/21/2019   Lab Results  Component Value Date   TSH 3.098 04/13/2016   Lab Results  Component Value Date   HGBA1C 7.2 (H) 06/02/2019     Review of Systems  Constitutional: Negative for appetite change, fatigue and unexpected weight change.  Eyes: Negative for visual disturbance.   Respiratory: Negative for cough, shortness of breath and wheezing.   Cardiovascular: Negative for chest pain, palpitations and leg swelling.  Gastrointestinal: Negative for abdominal pain and blood in stool.  Endocrine: Negative for polydipsia and polyuria.  Genitourinary: Negative for difficulty urinating, dysuria, frequency, hematuria and urgency.  Musculoskeletal: Positive for arthralgias (hand - trigger finger).  Skin: Negative for color change and rash.  Neurological: Negative for tremors, numbness and headaches.  Psychiatric/Behavioral: Negative for dysphoric mood.    Patient Active Problem List   Diagnosis Date Noted  . Status post colonoscopy 06/02/2019  . Hepatic steatosis 06/02/2019  . Barrett's esophagus without dysplasia 01/19/2019  . High coronary artery calcium score 12/29/2018  . Chronic iron deficiency anemia 12/22/2017  . Gastritis without bleeding 06/25/2017  . Vitamin B12 deficiency 08/03/2016  . OSA on CPAP 04/18/2016  . Nephrolithiasis 04/13/2016  . Chronic midline low back pain without sciatica 01/25/2016  . Primary osteoarthritis involving multiple joints 11/20/2015  . Bilateral carpal tunnel syndrome 09/14/2015  . Essential hypertension 09/14/2015  . Chronic knee pain 08/30/2014  . Erectile dysfunction 08/30/2014  . Other allergic rhinitis 08/30/2014  . Onychomycosis of toenail 07/24/2014  . Hyperlipidemia associated with type 2 diabetes mellitus ([place]) 04/23/2014  . Type II diabetes mellitus with complication ([place]) [id number]    Allergies  Allergen Reactions  . Dulaglutide Other (See Comments)    Trulicity- Caused pancreatitis   . Monosodium Glutamate Diarrhea    MSG  . Ciprofloxacin Other (See Comments)    GI upset  Past Surgical History:  Procedure Laterality Date  . CARPAL TUNNEL RELEASE Right 10/03/2018   Procedure: CARPAL TUNNEL RELEASE;  Surgeon: Earnestine Leys, MD;  Location: [facility] ORS;  Service: Orthopedics;  Laterality: Right;  .  CARPAL TUNNEL RELEASE Left 10/24/2018   Procedure: CARPAL TUNNEL RELEASE;  Surgeon: Earnestine Leys, MD;  Location: [facility];  Service: Orthopedics;  Laterality: Left;  . COLONOSCOPY    . COLONOSCOPY WITH PROPOFOL N/A 08/30/2019   Procedure: COLONOSCOPY WITH PROPOFOL;  Surgeon: Toledo, Benay Pike, MD;  Location: [facility] END[facility]COPY;  Service: Gastroenterology;  Laterality: N/A;  . CYSTOSCOPY WITH STENT PLACEMENT Bilateral 06/05/2016   Procedure: CYSTOSCOPY WITH STENT PLACEMENT;  Surgeon: Nickie Retort, MD;  Location: [facility];  Service: Urology;  Laterality: Bilateral;  . CYSTOSCOPY/URETEROSCOPY/HOLMIUM LASER/STENT PLACEMENT Left 04/13/2016   Procedure: CYSTOSCOPY/RETROGRADE PYELOGRAM/URETEROSCOPY WITH HOLMIUM LASER LITHOTRIPSY//STENT PLACEMENT;  Surgeon: Festus Aloe, MD;  Location: [facility];  Service: Urology;  Laterality: Left;  . ESOPHAGOGASTRODUODENOSCOPY (EGD) WITH PROPOFOL N/A 12/27/2015   Procedure: ESOPHAGOGASTRODUODENOSCOPY (EGD) WITH PROPOFOL;  Surgeon: Manya Silvas, MD;  Location: [facility] END[facility]COPY;  Service: Endoscopy;  Laterality: N/A;  . ESOPHAGOGASTRODUODENOSCOPY (EGD) WITH PROPOFOL N/A 08/30/2019   Procedure: ESOPHAGOGASTRODUODENOSCOPY (EGD) WITH PROPOFOL;  Surgeon: Toledo, Benay Pike, MD;  Location: [facility] ENDOSCOPY;  Service: Gastroenterology;  Laterality: N/A;  . EXTRACORPOREAL SHOCK WAVE LITHOTRIPSY Right 12/30/2017   Procedure: EXTRACORPOREAL SHOCK WAVE LITHOTRIPSY (ESWL);  Surgeon: Royston Cowper, MD;  Location: [facility] OR[facility];  Service: Urology;  Laterality: Right;  . HERNIA REPAIR  [id number]   umbilical  . KNEE ARTHROSCOPY Right 2015   had bursa sack repaired  . KNEE ARTHROSCOPY WITH MENISCAL REPAIR Right 11/18/2018   Procedure: KNEE ARTHROSCOPY WITH MENISCAL REPAIR AND CHONDROPLASTY;  Surgeon: Leim Fabry, MD;  Location: [place];  Service: Orthopedics;  Laterality: Right;  Diabetic - oral meds sleep apnea SUPINE WITH ACL LEG HOLDER SMITH AND NEWPHEW CETERIX  .  PREPATELLAR BURSA EXCISION Left 1993   and ostetomy. had at least 5 surgeries in 15 years  . SHOULDER SURGERY Right 2011   tendon was shredded and was trimmed, repaired and reattached. Screws in shoulder  . URETEROSCOPY WITH HOLMIUM LASER LITHOTRIPSY Bilateral 06/05/2016   Procedure: URETEROSCOPY WITH HOLMIUM LASER LITHOTRIPSY;  Surgeon: Nickie Retort, MD;  Location: [facility] ORS;  Service: Urology;  Laterality: Bilateral;  . VASECTOMY  2002    Social History   Tobacco Use  . Smoking status: Never Smoker  . Smokeless tobacco: Never Used  Substance Use Topics  . Alcohol use: No    Comment: very rare etoh (Holid[place])  . Drug use: No     Medication list has been reviewed and updated.  Current Meds  Medication Sig  . acetaminophen (TYLENOL) 500 MG tablet Take 2 tablets (1,000 mg total) by mouth every 8 (eight) hours.  Marland Kitchen amitriptyline (ELAVIL) 25 MG tablet Take 25 mg by mouth at bedtime. Recurring Cough- Dr Joya Gaskins-  . aspirin EC 81 MG tablet Take 81 mg by mouth daily.  Marland Kitchen atorvastatin (LIPITOR) 80 MG tablet Take 1 tablet (80 mg total) by mouth daily.  . Azelastine-Fluticasone (DYMISTA) 137-50 MCG/ACT SUSP Place 1 spray into both nostrils 2 (two) times daily.   . calcium citrate (CALCITRATE - DOSED IN MG ELEMENTAL CALCIUM) 950 MG tablet Take 200 mg of elemental calcium by mouth 3 (three) times daily.  . ciclopirox (PENLAC) 8 % solution Apply 1 application topically at bedtime. Apply over nail and surrounding skin. Apply daily over previous coat. After  seven (7) days, may remove with alcohol and continue cycle.  . cyanocobalamin (,VITAMIN B-12,) 1000 MCG/ML injection Inject 1 mL (1,000 mcg total) into the muscle every 30 (thirty) days.  . dapagliflozin propanediol (FARXIGA) 10 MG TABS tablet Take 10 mg by mouth daily.  . fenofibrate (TRICOR) 145 MG tablet Take 145 mg by mouth daily.   . ferrous gluconate (FERGON) 324 MG tablet Take 1 tablet (324 mg total) by mouth 3 (three) times daily with  meals.  . folic acid (FOLVITE) 1 MG tablet Take 1 tablet (1 mg total) by mouth daily.  Marland Kitchen gabapentin (NEURONTIN) 100 MG capsule Take 1 capsule by mouth 3 (three) times daily.  Marland Kitchen glimepiride (AMARYL) 2 MG tablet Take 4 tablets (8 mg total) by mouth See admin instructions. Take 2mg  with breakfast, 2mg  with lunch, and 4mg  with supper. (Patient taking differently: Take 8 mg by mouth 2 (two) times daily. )  . Icosapent Ethyl (VASCEPA) 1 g CAPS Take 2 capsules (2 g total) by mouth 2 (two) times daily.  Marland Kitchen ipratropium (ATROVENT) 0.06 % nasal spray Place 2 sprays into both nostrils 2 (two) times daily.   Marland Kitchen ketoconazole (NIZORAL) 2 % shampoo WASH SCALP EVERY OTHER WASHING ALTERNATING WITH HEAD AND SHOULDERS. LET SIT SEVERAL MINUTES BEFORE RINSING.  Marland Kitchen levocetirizine (XYZAL) 5 MG tablet Take 5 mg by mouth every evening.   Marland Kitchen losartan (COZAAR) 50 MG tablet Take 1 tablet (50 mg total) by mouth daily.  . meloxicam (MOBIC) 15 MG tablet Take 15 mg by mouth as needed for pain.  . metFORMIN (GLUCOPHAGE) 850 MG tablet Take 1 tablet (850 mg total) by mouth 3 (three) times daily with meals.  . Multiple Vitamin (MULTI-VITAMINS) TABS Take 1 tablet by mouth 3 (three) times daily.   . pantoprazole (PROTONIX) 40 MG tablet Take 1 tablet (40 mg total) by mouth 2 (two) times daily with a meal.  . pioglitazone (ACTOS) 30 MG tablet Take 1 tablet (30 mg total) by mouth daily.  Marland Kitchen PROAIR RESPICLICK 123XX123 (90 Base) MCG/ACT AEPB Inhale 2 puffs into the lungs every 6 (six) hours as needed (shortness of breath).   . sucralfate (CARAFATE) 1 g tablet Take 1 tablet (1 g total) by mouth 3 (three) times daily with meals.  . SYMBICORT 160-4.5 MCG/ACT inhaler Inhale 2 puffs into the lungs 2 (two) times daily.   . tadalafil (CIALIS) 5 MG tablet Take 5 mg by mouth daily as needed for erectile dysfunction.  Marland Kitchen tiZANidine (ZANAFLEX) 2 MG tablet Take 2-4 mg by mouth at bedtime as needed for muscle spasms.   . traMADol (ULTRAM) 50 MG tablet Take 50 mg  by mouth every 6 (six) hours as needed.  . vitamin C (ASCORBIC ACID) 500 MG tablet Take 500 mg by mouth daily.    PHQ 2/9 Scores 09/25/2019 06/02/2019  PHQ - 2 Score 0 0    BP Readings from Last 3 Encounters:  09/25/19 124/76  08/30/19 101/76  06/02/19 112/78    Physical Exam Vitals signs and nursing note reviewed.  Constitutional:      General: He is not in acute distress.    Appearance: Normal appearance. He is well-developed.  HENT:     Head: Normocephalic and atraumatic.  Neck:     Musculoskeletal: Normal range of motion.  Cardiovascular:     Rate and Rhythm: Normal rate and regular rhythm.     Pulses: Normal pulses.     Heart sounds: No murmur.  Pulmonary:  Effort: Pulmonary effort is normal. No respiratory distress.     Breath sounds: No wheezing or rhonchi.  Musculoskeletal: Normal range of motion.     Right lower leg: No edema.     Left lower leg: No edema.  Lymphadenopathy:     Cervical: No cervical adenopathy.  Skin:    General: Skin is warm and dry.     Capillary Refill: Capillary refill takes less than 2 seconds.     Findings: No rash.  Neurological:     General: No focal deficit present.     Mental Status: He is alert and oriented to person, place, and time.  Psychiatric:        Behavior: Behavior normal.        Thought Content: Thought content normal.     Wt Readings from Last 3 Encounters:  09/25/19 186 lb (84.4 kg)  08/30/19 182 lb (82.6 kg)  07/24/19 182 lb (82.6 kg)    BP 124/76   Pulse 90   Ht 5\' 8"  (1.727 m)   Wt 186 lb (84.4 kg)   SpO2 96%   BMI 28.28 kg/m   Assessment and Plan: 1. Type II diabetes mellitus with complication (HCC) Clinically stable by exam and report without s/s of hypoglycemia. DM complicated by HTN, lipids. Tolerating medications amaryl, actos, metformin and farxiga well without side effects or other concerns. See Endocrinology for ongoing management - Hemoglobin [id number] - Basic metabolic panel  2. Essential  hypertension Clinically stable exam with well controlled BP.   Tolerating medications, losartan 50 mg, without side effects at this time. Pt to continue current regimen and low sodium diet; benefits of regular exercise as able discussed.  3. Need for hepatitis C screening test - Hepatitis C antibody   Partially dictated using Editor, commissioning. Any errors are unintentional.  Halina Maidens, MD [facility]  09/25/2019

## 2019-09-26 LAB — HEMOGLOBIN A1C
Est. average glucose Bld gHb Est-mCnc: 160 mg/dL
Hgb A1c MFr Bld: 7.2 % — ABNORMAL HIGH (ref 4.8–5.6)

## 2019-09-26 LAB — BASIC METABOLIC PANEL
BUN/Creatinine Ratio: 19 (ref 9–20)
BUN: 24 mg/dL (ref 6–24)
CO2: 22 mmol/L (ref 20–29)
Calcium: 9.6 mg/dL (ref 8.7–10.2)
Chloride: 103 mmol/L (ref 96–106)
Creatinine, Ser: 1.25 mg/dL (ref 0.76–1.27)
GFR calc Af Amer: 75 mL/min/{1.73_m2} (ref >59)
GFR calc non Af Amer: 65 mL/min/{1.73_m2} (ref >59)
Glucose: 149 mg/dL — ABNORMAL HIGH (ref 65–99)
Potassium: 4.4 mmol/L (ref 3.5–5.2)
Sodium: 142 mmol/L (ref 134–144)

## 2019-09-26 LAB — HEPATITIS C ANTIBODY: Hep C Virus Ab: <0.1 s/co ratio (ref 0.0–0.9)

## 2019-10-13 ENCOUNTER — Ambulatory Visit: Payer: Managed Care, Other (non HMO) | Admitting: Internal Medicine

## 2019-10-17 LAB — LIPID PANEL
Chol/HDL Ratio: 3.4 ratio (ref 0.0–5.0)
Cholesterol, Total: 134 mg/dL (ref 100–199)
HDL: 39 mg/dL — ABNORMAL LOW (ref >39)
LDL Chol Calc (NIH): 81 mg/dL (ref 0–99)
Triglycerides: 70 mg/dL (ref 0–149)
VLDL Cholesterol Cal: 14 mg/dL (ref 5–40)

## 2019-10-24 ENCOUNTER — Ambulatory Visit (INDEPENDENT_AMBULATORY_CARE_PROVIDER_SITE_OTHER): Payer: Managed Care, Other (non HMO) | Admitting: Internal Medicine

## 2019-10-24 ENCOUNTER — Encounter: Payer: Self-pay | Admitting: Internal Medicine

## 2019-10-24 ENCOUNTER — Other Ambulatory Visit: Payer: Self-pay

## 2019-10-24 VITALS — BP 111/76 | HR 77 | Temp 97.7°F | Ht 68.0 in | Wt 189.2 lb

## 2019-10-24 DIAGNOSIS — E1100 Type 2 diabetes mellitus with hyperosmolarity without nonketotic hyperglycemic-hyperosmolar coma (NKHHC): Secondary | ICD-10-CM | POA: Diagnosis not present

## 2019-10-24 DIAGNOSIS — R931 Abnormal findings on diagnostic imaging of heart and coronary circulation: Secondary | ICD-10-CM | POA: Diagnosis not present

## 2019-10-24 DIAGNOSIS — E785 Hyperlipidemia, unspecified: Secondary | ICD-10-CM | POA: Diagnosis not present

## 2019-10-24 MED ORDER — EZETIMIBE 10 MG PO TABS: 10.0000 mg | ORAL_TABLET | Freq: Every day | ORAL | 3 refills | Status: DC

## 2019-10-24 NOTE — Patient Instructions (Signed)
Medication Instructions:  ZETIA 10MG  1 TABLET  DAILY *If you need a refill on your cardiac medications before your next appointment, please call your pharmacy*  Lab Work: LIPIDS Your physician recommends that you return for lab work in [place].   If you have labs (blood work) drawn today and your tests are completely normal, you will receive your results only by: Marland Kitchen MyChart Message (if you have MyChart) OR . A paper copy in the mail If you have any lab test that is abnormal or we need to change your treatment, we will call you to review the results.  Testing/Procedures: NONE AT THIS TIME  Follow-Up: At [facility], you and your health needs are our priority.  As part of our continuing mission to provide you with exceptional heart care, we have created designated Provider Care Teams.  These Care Teams include your primary Cardiologist (physician) and Advanced Practice Providers (APPs -  Physician Assistants and Nurse Practitioners) who all work together to provide you with the care you need, when you need it.  Your next appointment:   4 month(s)  The format for your next appointment:   Either In Person or Virtual  Provider:   Raliegh Ip Mali Hilty, MD  Other Instructions

## 2019-10-24 NOTE — Progress Notes (Signed)
LIPID CLINIC CONSULT NOTE  Chief Complaint:  Follow-up dyslipidemia  Primary Care Physician: Glean Hess, MD  Primary Cardiologist:  No primary care provider on file.  HPI:  Jesse Alvarado is a 53 y.o. male who is being seen today for the evaluation of manage dyslipidemia at the request of Glean Hess, MD.  This is a pleasant 53 year old male was kindly referred for evaluation and management of dyslipidemia.  He is a patient that is followed by my partner Dr. Percival Spanish and recently was noted to have a significantly elevated cholesterol profile.  His total cholesterol was 242 with triglycerides of 267, HDL 37 and LDL of 151.  He currently takes pravastatin 40 mg daily and fenofibrate 145 mg daily. Other medical problems include type 2 diabetes (non-insulin-dependent), chronic kidney disease, GERD, hypertension, OSA on CPAP and nephrolithiasis.  He was noted to have coronary artery calcification on his CT scan and was therefore referred to Dr. Percival Spanish for further evaluation and management.  LDL in October was 118 however most recently LDL was elevated further at 151.  He does report compliance with his medication.  07/24/2019  Jesse Alvarado returns today for follow-up dyslipidemia.  Overall he reports feeling well on medications.  He has had significant improvement in his numbers.  Total cholesterol now 158, triglycerides 96 (decreased from 314) and HDL 38, LDL 101.  Although we have targeted LDL less than 70, he has had significant improvement particularly in triglycerides on Vascepa.  He is also made significant dietary changes as well.  10/24/2019  Jesse Alvarado is seen today in follow-up.  Overall he has improved his cholesterol profile further.  His most recent showed total cholesterol 134, triglycerides 78, HDL 39 and LDL 81 (improved from 101).  Diet has played a big role in this.  He continues compliance with atorvastatin and Vascepa.  His target LDL in my mind would be less  than 70 given diabetes and known coronary disease.  We had previously discussed adding ezetimibe and he seems amenable to it.  PMHx:  Past Medical History:  Diagnosis Date  . Anemia    taking 3 iron pills a day  . Anemia   . Arthritis   . Asthma    sports induced asthma. takes inhalers when needed  . Barrett's esophagus without dysplasia   . Cancer (HCC)    Basal cell  . Chronic kidney disease    kidney stones  . Complication of anesthesia    high tolerance to pain medication. body absorbs pain med quickly  . Cough on exercise   . Diabetes mellitus without complication ([place])   . Diverticulosis   . Erectile dysfunction   . Fatty liver   . GERD (gastroesophageal reflux disease)   . History of kidney stones   . Hyperlipidemia   . Hypertension    per patient, he does not have high bp but is treated for his kidneys and his diabetes  . Kidney stone   . Pancreatitis   . Right shoulder injury   . Sleep apnea    use C-PAP  . Wears hearing aid in both ears     Past Surgical History:  Procedure Laterality Date  . CARPAL TUNNEL RELEASE Right 10/03/2018   Procedure: CARPAL TUNNEL RELEASE;  Surgeon: Earnestine Leys, MD;  Location: [facility];  Service: Orthopedics;  Laterality: Right;  . CARPAL TUNNEL RELEASE Left 10/24/2018   Procedure: CARPAL TUNNEL RELEASE;  Surgeon: Earnestine Leys, MD;  Location: [facility];  Service: Orthopedics;  Laterality: Left;  . COLONOSCOPY    . COLONOSCOPY WITH PROPOFOL N/A 08/30/2019   Procedure: COLONOSCOPY WITH PROPOFOL;  Surgeon: Toledo, Benay Pike, MD;  Location: [facility];  Service: Gastroenterology;  Laterality: N/A;  . CYSTOSCOPY WITH STENT PLACEMENT Bilateral 06/05/2016   Procedure: CYSTOSCOPY WITH STENT PLACEMENT;  Surgeon: Nickie Retort, MD;  Location: [facility];  Service: Urology;  Laterality: Bilateral;  . CYSTOSCOPY/URETEROSCOPY/HOLMIUM LASER/STENT PLACEMENT Left 04/13/2016   Procedure: CYSTOSCOPY/RETROGRADE PYELOGRAM/URETEROSCOPY WITH  HOLMIUM LASER LITHOTRIPSY//STENT PLACEMENT;  Surgeon: Festus Aloe, MD;  Location: [facility];  Service: Urology;  Laterality: Left;  . ESOPHAGOGASTRODUODENOSCOPY (EGD) WITH PROPOFOL N/A 12/27/2015   Procedure: ESOPHAGOGASTRODUODENOSCOPY (EGD) WITH PROPOFOL;  Surgeon: Manya Silvas, MD;  Location: [facility];  Service: Endoscopy;  Laterality: N/A;  . ESOPHAGOGASTRODUODENOSCOPY (EGD) WITH PROPOFOL N/A 08/30/2019   Procedure: ESOPHAGOGASTRODUODENOSCOPY (EGD) WITH PROPOFOL;  Surgeon: Toledo, Benay Pike, MD;  Location: [facility];  Service: Gastroenterology;  Laterality: N/A;  . EXTRACORPOREAL SHOCK WAVE LITHOTRIPSY Right 12/30/2017   Procedure: EXTRACORPOREAL SHOCK WAVE LITHOTRIPSY (ESWL);  Surgeon: Royston Cowper, MD;  Location: [facility];  Service: Urology;  Laterality: Right;  . HERNIA REPAIR  [id number]   umbilical  . KNEE ARTHROSCOPY Right 2015   had bursa sack repaired  . KNEE ARTHROSCOPY WITH MENISCAL REPAIR Right 11/18/2018   Procedure: KNEE ARTHROSCOPY WITH MENISCAL REPAIR AND CHONDROPLASTY;  Surgeon: Leim Fabry, MD;  Location: [place];  Service: Orthopedics;  Laterality: Right;  Diabetic - oral meds sleep apnea SUPINE WITH ACL LEG HOLDER SMITH AND NEWPHEW CETERIX  . PREPATELLAR BURSA EXCISION Left 1993   and ostetomy. had at least 5 surgeries in 15 years  . SHOULDER SURGERY Right 2011   tendon was shredded and was trimmed, repaired and reattached. Screws in shoulder  . URETEROSCOPY WITH HOLMIUM LASER LITHOTRIPSY Bilateral 06/05/2016   Procedure: URETEROSCOPY WITH HOLMIUM LASER LITHOTRIPSY;  Surgeon: Nickie Retort, MD;  Location: [facility];  Service: Urology;  Laterality: Bilateral;  . VASECTOMY  2002    FAMHx:  Family History  Problem Relation Age of Onset  . Urolithiasis Father   . Kidney disease Father   . Prostate cancer Neg Hx   . Kidney cancer Neg Hx     SOCHx:   reports that he has never smoked. He has never used smokeless tobacco. He reports that  he does not drink alcohol or use drugs.  ALLERGIES:  Allergies  Allergen Reactions  . Dulaglutide Other (See Comments)    Trulicity- Caused pancreatitis   . Monosodium Glutamate Diarrhea    MSG  . Ciprofloxacin Other (See Comments)    GI upset    ROS: Pertinent items noted in HPI and remainder of comprehensive ROS otherwise negative.  HOME MEDS: Current Outpatient Medications on File Prior to Visit  Medication Sig Dispense Refill  . acetaminophen (TYLENOL) 500 MG tablet Take 2 tablets (1,000 mg total) by mouth every 8 (eight) hours. 90 tablet 2  . amitriptyline (ELAVIL) 25 MG tablet Take 25 mg by mouth at bedtime. Recurring Cough- Dr Joya Gaskins-  11  . aspirin EC 81 MG tablet Take 81 mg by mouth daily.    . Azelastine-Fluticasone (DYMISTA) 137-50 MCG/ACT SUSP Place 1 spray into both nostrils 2 (two) times daily.     . calcium citrate (CALCITRATE - DOSED IN MG ELEMENTAL CALCIUM) 950 MG tablet Take 200 mg of elemental calcium by mouth 3 (three) times daily.    . ciclopirox (PENLAC) 8 % solution Apply 1  application topically at bedtime. Apply over nail and surrounding skin. Apply daily over previous coat. After seven (7) days, may remove with alcohol and continue cycle. 6.6 mL 5  . cyanocobalamin (,VITAMIN B-12,) 1000 MCG/ML injection Inject 1 mL (1,000 mcg total) into the muscle every 30 (thirty) days. 1 mL 5  . dapagliflozin propanediol (FARXIGA) 10 MG TABS tablet Take 10 mg by mouth daily.    . fenofibrate (TRICOR) 145 MG tablet Take 145 mg by mouth daily.     . ferrous gluconate (FERGON) 324 MG tablet Take 1 tablet (324 mg total) by mouth 3 (three) times daily with meals. [id number] tablet 1  . folic acid (FOLVITE) 1 MG tablet Take 1 tablet (1 mg total) by mouth daily. 90 tablet 1  . gabapentin (NEURONTIN) 100 MG capsule Take 1 capsule by mouth 3 (three) times daily.    Marland Kitchen glimepiride (AMARYL) 2 MG tablet Take 4 tablets (8 mg total) by mouth See admin instructions. Take 2mg  with breakfast, 2mg   with lunch, and 4mg  with supper. (Patient taking differently: Take 2 mg by mouth 2 (two) times daily. ) 360 tablet 1  . Icosapent Ethyl (VASCEPA) 1 g CAPS Take 2 capsules (2 g total) by mouth 2 (two) times daily. 120 capsule 11  . ipratropium (ATROVENT) 0.06 % nasal spray Place 2 sprays into both nostrils 2 (two) times daily.   3  . ketoconazole (NIZORAL) 2 % shampoo WASH SCALP EVERY OTHER WASHING ALTERNATING WITH HEAD AND SHOULDERS. LET SIT SEVERAL MINUTES BEFORE RINSING.    Marland Kitchen levocetirizine (XYZAL) 5 MG tablet Take 5 mg by mouth every evening.   11  . losartan (COZAAR) 50 MG tablet Take 1 tablet (50 mg total) by mouth daily. 90 tablet 1  . meloxicam (MOBIC) 15 MG tablet Take 15 mg by mouth as needed for pain.    . metFORMIN (GLUCOPHAGE) 850 MG tablet Take 1 tablet (850 mg total) by mouth 3 (three) times daily with meals. 270 tablet 1  . Multiple Vitamin (MULTI-VITAMINS) TABS Take 1 tablet by mouth 3 (three) times daily.     . pantoprazole (PROTONIX) 40 MG tablet Take 1 tablet (40 mg total) by mouth 2 (two) times daily with a meal. 180 tablet 1  . pioglitazone (ACTOS) 30 MG tablet Take 1 tablet (30 mg total) by mouth daily. 90 tablet 1  . PROAIR RESPICLICK [id number] (90 Base) MCG/ACT AEPB Inhale 2 puffs into the lungs every 6 (six) hours as needed (shortness of breath).   0  . sucralfate (CARAFATE) 1 g tablet Take 1 tablet (1 g total) by mouth 3 (three) times daily with meals. 270 tablet 1  . SYMBICORT 160-4.5 MCG/ACT inhaler Inhale 2 puffs into the lungs 2 (two) times daily.   3  . tadalafil (CIALIS) 5 MG tablet Take 5 mg by mouth daily as needed for erectile dysfunction.    Marland Kitchen tiZANidine (ZANAFLEX) 2 MG tablet Take 2-4 mg by mouth at bedtime as needed for muscle spasms.     . traMADol (ULTRAM) 50 MG tablet Take 50 mg by mouth every 6 (six) hours as needed.    . vitamin C (ASCORBIC ACID) 500 MG tablet Take 500 mg by mouth daily.    Marland Kitchen atorvastatin (LIPITOR) 80 MG tablet Take 1 tablet (80 mg total) by  mouth daily. 90 tablet 3  . influenza vac split quadrivalent PF (AFLURIA QUADRIVALENT) 0.5 ML injection Afluria Qd 2020-21 (36 mos up)(PF)60 mcg (15 mcg x4)/0.5 mL IM syringe  ADM  0.5ML IM UTD    . NON FORMULARY Take by mouth.    . polyethylene glycol-electrolytes (NULYTELY/GOLYTELY) 420 g solution peg-electrolyte solution 420 gram oral solution  TK 4000 MLS PO ONCE FOR 1 DOSE     No current facility-administered medications on file prior to visit.    LABS/IMAGING: No results found for this or any previous visit (from the past 48 hour(s)). No results found.  LIPID PANEL:    Component Value Date/Time   CHOL 134 10/16/2019 0828   TRIG 70 10/16/2019 0828   HDL 39 (L) 10/16/2019 0828   CHOLHDL 3.4 10/16/2019 0828   CHOLHDL 5.9 07/30/2018 0513   VLDL 63 (H) 07/30/2018 0513   LDLCALC 81 10/16/2019 0828    WEIGHTS: Wt Readings from Last 3 Encounters:  10/24/19 189 lb 3.2 oz (85.8 kg)  09/25/19 186 lb (84.4 kg)  08/30/19 182 lb (82.6 kg)    VITALS: BP 111/76   Pulse 77   Temp 97.7 F (36.5 C)   Ht 5\' 8"  (1.727 m)   Wt 189 lb 3.2 oz (85.8 kg)   SpO2 97%   BMI 28.77 kg/m   EXAM: Deferred  EKG: Deferred  ASSESSMENT: 1. Mixed dyslipidemia 2. High coronary artery calcium score 3. Essential hypertension 4. Type 2 diabetes (not on insulin)  PLAN: 1.   Jesse Alvarado has had further improvement in his lipid profile with dietary modifications.  LDL has come down from 10 1-81 however remains above target LDL less than 70.  Recommend adding ezetimibe 10 mg daily to his regimen.  We will repeat lipids in 3 to 4 months.  If he is stable we could follow him annually or return care to Dr. Percival Spanish.  Follow-up in 3 to 4 months.  Pixie Casino, MD, [facility], [place] Director of the [facility]  [facility] Diplomate of the American Board of Clinical Lipidology Attending Cardiologist  Direct Dial:  [phone]  Fax: [phone]  Website:  [email or web address] Staples 10/24/2019, 8:57 AM

## 2019-10-30 ENCOUNTER — Telehealth: Payer: Self-pay

## 2019-10-30 NOTE — Telephone Encounter (Signed)
Pt called needing a refill on his Tricor.  Advised him to contact his cardiologist Dr Debara Pickett since it seems that is who prescribed it before.  Lvm for pt

## 2019-11-06 ENCOUNTER — Telehealth: Payer: Self-pay | Admitting: Internal Medicine

## 2019-11-06 ENCOUNTER — Other Ambulatory Visit: Payer: Self-pay | Admitting: Internal Medicine

## 2019-11-06 DIAGNOSIS — E785 Hyperlipidemia, unspecified: Secondary | ICD-10-CM

## 2019-11-06 DIAGNOSIS — E1169 Type 2 diabetes mellitus with other specified complication: Secondary | ICD-10-CM

## 2019-11-06 MED ORDER — FENOFIBRATE 145 MG PO TABS: 145.0000 mg | ORAL_TABLET | Freq: Every day | ORAL | 3 refills | Status: DC

## 2019-11-06 NOTE — Telephone Encounter (Signed)
Pt needs refill sent in [facility] on [address], [place]    fenofibrate (TRICOR) 145 MG tablet [id number]

## 2019-11-14 ENCOUNTER — Other Ambulatory Visit: Payer: Self-pay

## 2019-11-14 DIAGNOSIS — D649 Anemia, unspecified: Secondary | ICD-10-CM

## 2019-11-17 LAB — B12 AND FOLATE PANEL
Folate: >20 ng/mL (ref >3.0)
Vitamin B-12: 339 pg/mL (ref 232–1245)

## 2019-11-17 LAB — IRON,TIBC AND FERRITIN PANEL
Ferritin: 53 ng/mL (ref 30–400)
Iron Saturation: 15 % (ref 15–55)
Iron: 57 ug/dL (ref 38–169)
Total Iron Binding Capacity: 393 ug/dL (ref 250–450)
UIBC: 336 ug/dL (ref 111–343)

## 2019-11-21 ENCOUNTER — Other Ambulatory Visit: Payer: Self-pay | Admitting: Internal Medicine

## 2019-11-21 DIAGNOSIS — E118 Type 2 diabetes mellitus with unspecified complications: Secondary | ICD-10-CM

## 2019-11-23 ENCOUNTER — Telehealth: Payer: Self-pay | Admitting: Internal Medicine

## 2019-11-23 NOTE — Telephone Encounter (Signed)
New medication  Patient wants know if there is a substitute for Vascepa. The patient states that he can't afford this medication. Please call to discuss.

## 2019-11-24 NOTE — Telephone Encounter (Signed)
Co-pay card info sent to patient via MyChart

## 2019-12-19 ENCOUNTER — Telehealth: Payer: Self-pay | Admitting: Internal Medicine

## 2019-12-19 NOTE — Telephone Encounter (Signed)
PA for vascepa submitted via CMM - [employer] (Key: [id number])

## 2019-12-19 NOTE — Telephone Encounter (Signed)
PA request cancelled today, per CMM There is currently an authorization in place for this product, effective 12/29/2018 through 11/08/2020

## 2019-12-25 ENCOUNTER — Other Ambulatory Visit: Payer: Self-pay | Admitting: Internal Medicine

## 2019-12-25 DIAGNOSIS — E781 Pure hyperglyceridemia: Secondary | ICD-10-CM

## 2019-12-27 ENCOUNTER — Other Ambulatory Visit: Payer: Self-pay

## 2019-12-27 DIAGNOSIS — K2270 Barrett's esophagus without dysplasia: Secondary | ICD-10-CM

## 2019-12-27 MED ORDER — PANTOPRAZOLE SODIUM 40 MG PO TBEC: 40.0000 mg | DELAYED_RELEASE_TABLET | Freq: Every day | ORAL | 1 refills | Status: DC | PRN

## 2020-01-07 ENCOUNTER — Other Ambulatory Visit: Payer: Self-pay | Admitting: Internal Medicine

## 2020-01-07 DIAGNOSIS — E781 Pure hyperglyceridemia: Secondary | ICD-10-CM

## 2020-02-13 ENCOUNTER — Other Ambulatory Visit: Payer: Self-pay

## 2020-02-13 DIAGNOSIS — K2270 Barrett's esophagus without dysplasia: Secondary | ICD-10-CM

## 2020-02-13 MED ORDER — PANTOPRAZOLE SODIUM 40 MG PO TBEC: 40.0000 mg | DELAYED_RELEASE_TABLET | Freq: Two times a day (BID) | ORAL | 1 refills | Status: DC

## 2020-02-26 LAB — BASIC METABOLIC PANEL
BUN: 23 — AB (ref 4–21)
CO2: 28 — AB (ref 13–22)
Chloride: 105 (ref 99–108)
Creatinine: 1.4 — AB (ref 0.6–1.3)
Glucose: 94
Potassium: 4.2 (ref 3.4–5.3)
Sodium: 140 (ref 137–147)

## 2020-02-26 LAB — HEPATIC FUNCTION PANEL
ALT: 29 (ref 10–40)
AST: 29 (ref 14–40)
Alkaline Phosphatase: 35 (ref 25–125)
Bilirubin, Total: 0.5

## 2020-02-26 LAB — LIPID PANEL
Cholesterol: 117 (ref 0–200)
HDL: 34 — AB (ref 35–70)
LDL Cholesterol: 59
Triglycerides: 121 (ref 40–160)

## 2020-02-26 LAB — HEMOGLOBIN A1C: Hemoglobin A1C: 7.9

## 2020-02-26 LAB — COMPREHENSIVE METABOLIC PANEL: GFR calc non Af Amer: 53

## 2020-02-26 LAB — MICROALBUMIN, URINE: Microalb, Ur: 7

## 2020-03-15 ENCOUNTER — Encounter: Payer: Managed Care, Other (non HMO) | Admitting: Internal Medicine

## 2020-04-01 ENCOUNTER — Other Ambulatory Visit: Payer: Self-pay | Admitting: Internal Medicine

## 2020-04-01 DIAGNOSIS — D509 Iron deficiency anemia, unspecified: Secondary | ICD-10-CM

## 2020-04-05 ENCOUNTER — Other Ambulatory Visit: Payer: Self-pay

## 2020-04-05 ENCOUNTER — Encounter: Payer: Self-pay | Admitting: Internal Medicine

## 2020-04-05 ENCOUNTER — Ambulatory Visit (INDEPENDENT_AMBULATORY_CARE_PROVIDER_SITE_OTHER): Payer: Managed Care, Other (non HMO) | Admitting: Internal Medicine

## 2020-04-05 VITALS — BP 112/80 | HR 94 | Temp 98.7°F | Ht 68.0 in | Wt 183.0 lb

## 2020-04-05 DIAGNOSIS — Z0000 Encounter for general adult medical examination without abnormal findings: Secondary | ICD-10-CM | POA: Diagnosis not present

## 2020-04-05 DIAGNOSIS — Z125 Encounter for screening for malignant neoplasm of prostate: Secondary | ICD-10-CM | POA: Diagnosis not present

## 2020-04-05 DIAGNOSIS — M25561 Pain in right knee: Secondary | ICD-10-CM | POA: Diagnosis not present

## 2020-04-05 DIAGNOSIS — G8929 Other chronic pain: Secondary | ICD-10-CM | POA: Diagnosis not present

## 2020-04-05 DIAGNOSIS — E1169 Type 2 diabetes mellitus with other specified complication: Secondary | ICD-10-CM

## 2020-04-05 DIAGNOSIS — D509 Iron deficiency anemia, unspecified: Secondary | ICD-10-CM

## 2020-04-05 DIAGNOSIS — E785 Hyperlipidemia, unspecified: Secondary | ICD-10-CM

## 2020-04-05 DIAGNOSIS — E118 Type 2 diabetes mellitus with unspecified complications: Secondary | ICD-10-CM | POA: Diagnosis not present

## 2020-04-05 DIAGNOSIS — I10 Essential (primary) hypertension: Secondary | ICD-10-CM | POA: Diagnosis not present

## 2020-04-05 LAB — POCT URINALYSIS DIPSTICK
Bilirubin, UA: NEGATIVE
Glucose, UA: POSITIVE — AB
Ketones, UA: NEGATIVE
Leukocytes, UA: NEGATIVE
Nitrite, UA: NEGATIVE
Protein, UA: NEGATIVE
Spec Grav, UA: 1.01 (ref 1.010–1.025)
Urobilinogen, UA: 0.2 E.U./dL
pH, UA: 6.5 (ref 5.0–8.0)

## 2020-04-05 NOTE — Progress Notes (Signed)
Date:  04/05/2020   Name:  Jesse Alvarado   DOB:  1966/01/17   MRN:  [id number]   Chief Complaint: Annual Exam (test done A1C 7.9 and then 6.0 ) Jesse Alvarado is a 54 y.o. male who presents today for his Complete Annual Exam. He feels fairly well. He reports exercising some. He reports he is sleeping fairly well.   Colonoscopy  08/2019 Immunization History  Administered Date(s) Administered  . Hep A / Hep B 09/10/2017, 10/22/2017, 03/25/2018  . Influenza Split 08/13/2015  . Influenza,inj,Quad PF,6+ Mos 08/09/2017, 09/09/2019  . Influenza-Unspecified 08/10/2014, 07/31/2016  . Janssen (J&J) SARS-COV-2 Vaccination 02/14/2020  . Pneumococcal Polysaccharide-23 10/24/2011  . Tdap 07/31/2016  . Zoster Recombinat (Shingrix) 08/02/2018, 01/20/2019    Hypertension This is a chronic problem. The problem is controlled. Associated symptoms include sweats. Pertinent negatives include no chest pain, headaches, malaise/fatigue, palpitations or shortness of breath. Past treatments include angiotensin blockers. The current treatment provides significant improvement.  Diabetes He presents for his follow-up diabetic visit. He has type 2 diabetes mellitus. His disease course has been stable (recently had extensive labs from endocrinology). Hypoglycemia symptoms include nervousness/anxiousness and sweats. Pertinent negatives for hypoglycemia include no confusion, dizziness, headaches or pallor. Pertinent negatives for diabetes include no chest pain, no fatigue and no weight loss. Current diabetic treatment includes oral agent (triple therapy) (farixag, metformin, glimepiride and Actos). He is compliant with treatment all of the time. His weight is stable. An ACE inhibitor/angiotensin II receptor blocker is being taken. Eye exam is current.  Hyperlipidemia The problem is controlled. Pertinent negatives include no chest pain, myalgias or shortness of breath. Current antihyperlipidemic treatment includes  ezetimibe, fibric acid derivatives and statins. The current treatment provides significant improvement of lipids.  Knee Pain  The pain is present in the right knee. The quality of the pain is described as aching. The pain is mild. The pain has been worsening since onset. Treatments tried: s/p arthroscopic mensicus repair. Improvement on treatment: tramadol prn.  Anemia Presents for follow-up (had full work up but recent labs showed Hgb lower) visit. There has been no abdominal pain, confusion, light-headedness, malaise/fatigue, pallor, palpitations or weight loss. Signs of blood loss that are not present include hematemesis and melena. There are no compliance problems.  Compliance with medications: takes B12 injections and Fe tid.    Lab Results  Component Value Date   CREATININE 1.4 (A) 02/26/2020   BUN 23 (A) 02/26/2020   NA 140 02/26/2020   K 4.2 02/26/2020   CL 105 02/26/2020   CO2 28 (A) 02/26/2020   Lab Results  Component Value Date   CHOL 117 02/26/2020   HDL 34 (A) 02/26/2020   LDLCALC 59 02/26/2020   TRIG 121 02/26/2020   CHOLHDL 3.4 10/16/2019   Lab Results  Component Value Date   TSH 3.098 04/13/2016   Lab Results  Component Value Date   HGBA1C 7.9 02/26/2020   Lab Results  Component Value Date   WBC 7.8 06/02/2019   HGB 13.5 06/02/2019   HCT 40.0 06/02/2019   MCV 87 06/02/2019   PLT 370 06/02/2019   Lab Results  Component Value Date   ALT 29 02/26/2020   AST 29 02/26/2020   ALKPHOS 35 02/26/2020   BILITOT 0.3 06/02/2019     Review of Systems  Constitutional: Negative for appetite change, chills, diaphoresis, fatigue, malaise/fatigue, unexpected weight change and weight loss.  HENT: Negative for hearing loss, tinnitus, trouble swallowing and voice change.  Eyes: Negative for visual disturbance.  Respiratory: Positive for cough (chronic cough). Negative for choking, shortness of breath and wheezing.   Cardiovascular: Negative for chest pain,  palpitations and leg swelling.  Gastrointestinal: Negative for abdominal pain, blood in stool, constipation, diarrhea, hematemesis and melena.  Genitourinary: Negative for difficulty urinating, dysuria, frequency and hematuria.  Musculoskeletal: Positive for arthralgias (right knee torn meniscus). Negative for back pain and myalgias.  Skin: Negative for color change, pallor and rash.  Neurological: Negative for dizziness, syncope, light-headedness and headaches.  Hematological: Negative for adenopathy.  Psychiatric/Behavioral: Negative for confusion, dysphoric mood and sleep disturbance. The patient is nervous/anxious.     Patient Active Problem List   Diagnosis Date Noted  . Status post colonoscopy 06/02/2019  . Hepatic steatosis 06/02/2019  . Barrett's esophagus without dysplasia 01/19/2019  . High coronary artery calcium score 12/29/2018  . Chronic iron deficiency anemia 12/22/2017  . Gastritis without bleeding 06/25/2017  . Vitamin B12 deficiency 08/03/2016  . OSA on CPAP 04/18/2016  . Nephrolithiasis 04/13/2016  . Chronic midline low back pain without sciatica 01/25/2016  . Primary osteoarthritis involving multiple joints 11/20/2015  . Bilateral carpal tunnel syndrome 09/14/2015  . Essential hypertension 09/14/2015  . Chronic knee pain 08/30/2014  . Erectile dysfunction 08/30/2014  . Other allergic rhinitis 08/30/2014  . Onychomycosis of toenail 07/24/2014  . Hyperlipidemia associated with type 2 diabetes mellitus ([place]) 04/23/2014  . Type II diabetes mellitus with complication ([place]) [id number]    Allergies  Allergen Reactions  . Dulaglutide Other (See Comments)    Trulicity- Caused pancreatitis   . Monosodium Glutamate Diarrhea    MSG  . Ciprofloxacin Other (See Comments)    GI upset    Past Surgical History:  Procedure Laterality Date  . CARPAL TUNNEL RELEASE Right 10/03/2018   Procedure: CARPAL TUNNEL RELEASE;  Surgeon: Earnestine Leys, MD;  Location: [facility];   Service: Orthopedics;  Laterality: Right;  . CARPAL TUNNEL RELEASE Left 10/24/2018   Procedure: CARPAL TUNNEL RELEASE;  Surgeon: Earnestine Leys, MD;  Location: [facility];  Service: Orthopedics;  Laterality: Left;  . COLONOSCOPY    . COLONOSCOPY WITH PROPOFOL N/A 08/30/2019   Procedure: COLONOSCOPY WITH PROPOFOL;  Surgeon: Toledo, Benay Pike, MD;  Location: [facility];  Service: Gastroenterology;  Laterality: N/A;  . CYSTOSCOPY WITH STENT PLACEMENT Bilateral 06/05/2016   Procedure: CYSTOSCOPY WITH STENT PLACEMENT;  Surgeon: Nickie Retort, MD;  Location: [facility];  Service: Urology;  Laterality: Bilateral;  . CYSTOSCOPY/URETEROSCOPY/HOLMIUM LASER/STENT PLACEMENT Left 04/13/2016   Procedure: CYSTOSCOPY/RETROGRADE PYELOGRAM/URETEROSCOPY WITH HOLMIUM LASER LITHOTRIPSY//STENT PLACEMENT;  Surgeon: Festus Aloe, MD;  Location: [facility];  Service: Urology;  Laterality: Left;  . ESOPHAGOGASTRODUODENOSCOPY (EGD) WITH PROPOFOL N/A 12/27/2015   Procedure: ESOPHAGOGASTRODUODENOSCOPY (EGD) WITH PROPOFOL;  Surgeon: Manya Silvas, MD;  Location: [facility];  Service: Endoscopy;  Laterality: N/A;  . ESOPHAGOGASTRODUODENOSCOPY (EGD) WITH PROPOFOL N/A 08/30/2019   Procedure: ESOPHAGOGASTRODUODENOSCOPY (EGD) WITH PROPOFOL;  Surgeon: Toledo, Benay Pike, MD;  Location: [facility];  Service: Gastroenterology;  Laterality: N/A;  . EXTRACORPOREAL SHOCK WAVE LITHOTRIPSY Right 12/30/2017   Procedure: EXTRACORPOREAL SHOCK WAVE LITHOTRIPSY (ESWL);  Surgeon: Royston Cowper, MD;  Location: [facility];  Service: Urology;  Laterality: Right;  . HERNIA REPAIR  [id number]   umbilical  . KNEE ARTHROSCOPY Right 2015   had bursa sack repaired  . KNEE ARTHROSCOPY WITH MENISCAL REPAIR Right 11/18/2018   Procedure: KNEE ARTHROSCOPY WITH MENISCAL REPAIR AND CHONDROPLASTY;  Surgeon: Leim Fabry, MD;  Location: [place];  Service:  Orthopedics;  Laterality: Right;  Diabetic - oral meds sleep apnea SUPINE WITH ACL LEG  HOLDER SMITH AND NEWPHEW CETERIX  . PREPATELLAR BURSA EXCISION Left 1993   and ostetomy. had at least 5 surgeries in 15 years  . SHOULDER SURGERY Right 2011   tendon was shredded and was trimmed, repaired and reattached. Screws in shoulder  . URETEROSCOPY WITH HOLMIUM LASER LITHOTRIPSY Bilateral 06/05/2016   Procedure: URETEROSCOPY WITH HOLMIUM LASER LITHOTRIPSY;  Surgeon: Nickie Retort, MD;  Location: [facility];  Service: Urology;  Laterality: Bilateral;  . VASECTOMY  2002    Social History   Tobacco Use  . Smoking status: Never Smoker  . Smokeless tobacco: Never Used  Substance Use Topics  . Alcohol use: No    Comment: very rare etoh (Holidays)  . Drug use: No     Medication list has been reviewed and updated.  Current Meds  Medication Sig  . amitriptyline (ELAVIL) 25 MG tablet Take 25 mg by mouth at bedtime. Recurring Cough- Dr Joya Gaskins-  . aspirin EC 81 MG tablet Take 81 mg by mouth daily.  Marland Kitchen atorvastatin (LIPITOR) 80 MG tablet TAKE 1 TABLET(80 MG) BY MOUTH DAILY  . Azelastine-Fluticasone (DYMISTA) 137-50 MCG/ACT SUSP Place 1 spray into both nostrils 2 (two) times daily.   . calcium citrate (CALCITRATE - DOSED IN MG ELEMENTAL CALCIUM) 950 MG tablet Take 200 mg of elemental calcium by mouth 3 (three) times daily. TUMS  . ciclopirox (PENLAC) 8 % solution Apply 1 application topically at bedtime. Apply over nail and surrounding skin. Apply daily over previous coat. After seven (7) days, may remove with alcohol and continue cycle.  . cyanocobalamin (,VITAMIN B-12,) 1000 MCG/ML injection Inject 1 mL (1,000 mcg total) into the muscle every 30 (thirty) days.  . dapagliflozin propanediol (FARXIGA) 10 MG TABS tablet Take 10 mg by mouth daily.  . fenofibrate (TRICOR) 145 MG tablet Take 1 tablet (145 mg total) by mouth daily.  . ferrous gluconate (FERGON) 324 MG tablet Take 1 tablet (324 mg total) by mouth 3 (three) times daily with meals.  . folic acid (FOLVITE) 1 MG tablet TAKE 1  TABLET(1 MG) BY MOUTH DAILY  . gabapentin (NEURONTIN) 300 MG capsule Take 300 mg by mouth 3 (three) times daily. For chronic cough/laryngeal inflammation  . glimepiride (AMARYL) 2 MG tablet Take 4 tablets (8 mg total) by mouth See admin instructions. Take 2mg  with breakfast, 2mg  with lunch, and 4mg  with supper. (Patient taking differently: Take 2 mg by mouth 2 (two) times daily. One in the morning and one in the evening)  . ipratropium (ATROVENT) 0.06 % nasal spray Place 2 sprays into both nostrils 2 (two) times daily.   Marland Kitchen ketoconazole (NIZORAL) 2 % shampoo WASH SCALP EVERY OTHER WASHING ALTERNATING WITH HEAD AND SHOULDERS. LET SIT SEVERAL MINUTES BEFORE RINSING.  Marland Kitchen levocetirizine (XYZAL) 5 MG tablet Take 5 mg by mouth every evening.   Marland Kitchen losartan (COZAAR) 50 MG tablet Take 1 tablet (50 mg total) by mouth daily.  . metFORMIN (GLUCOPHAGE) 850 MG tablet Take 1 tablet (850 mg total) by mouth 3 (three) times daily with meals.  . Multiple Vitamin (MULTI-VITAMINS) TABS Take 1 tablet by mouth 3 (three) times daily.   . pantoprazole (PROTONIX) 40 MG tablet Take 1 tablet (40 mg total) by mouth 2 (two) times daily.  . pioglitazone (ACTOS) 30 MG tablet TAKE 1 TABLET(30 MG) BY MOUTH DAILY  . polyethylene glycol-electrolytes (NULYTELY/GOLYTELY) 420 g solution STOOL SOFT.  Marland Kitchen PROAIR RESPICLICK  108 (90 Base) MCG/ACT AEPB Inhale 2 puffs into the lungs every 6 (six) hours as needed (shortness of breath).   . sucralfate (CARAFATE) 1 g tablet Take 1 tablet (1 g total) by mouth 3 (three) times daily with meals.  . SYMBICORT 160-4.5 MCG/ACT inhaler Inhale 2 puffs into the lungs 2 (two) times daily.   . tadalafil (CIALIS) 5 MG tablet Take 5 mg by mouth daily as needed for erectile dysfunction.  Marland Kitchen tiZANidine (ZANAFLEX) 2 MG tablet Take 2-4 mg by mouth at bedtime as needed for muscle spasms.   . traMADol (ULTRAM) 50 MG tablet Take 50 mg by mouth every 6 (six) hours as needed. As needed for knee pain s/p arthroscopy  .  VASCEPA 1 g capsule TAKE 2 CAPSULES(2 GRAMS) BY MOUTH TWICE DAILY  . vitamin C (ASCORBIC ACID) 500 MG tablet Take 500 mg by mouth daily.  . [DISCONTINUED] meloxicam (MOBIC) 15 MG tablet Take 15 mg by mouth as needed for pain.    PHQ 2/9 Scores 04/05/2020 09/25/2019 06/02/2019  PHQ - 2 Score 0 0 0  PHQ- 9 Score 0 - -    BP Readings from Last 3 Encounters:  04/05/20 112/80  10/24/19 111/76  09/25/19 124/76    Physical Exam Vitals and nursing note reviewed.  Constitutional:      Appearance: Normal appearance. He is well-developed.  HENT:     Head: Normocephalic.     Right Ear: Tympanic membrane, ear canal and external ear normal.     Left Ear: Tympanic membrane, ear canal and external ear normal.     Nose: Nose normal.     Mouth/Throat:     Pharynx: Uvula midline.  Eyes:     Conjunctiva/sclera: Conjunctivae normal.     Pupils: Pupils are equal, round, and reactive to light.  Neck:     Thyroid: No thyromegaly.     Vascular: No carotid bruit.  Cardiovascular:     Rate and Rhythm: Normal rate and regular rhythm.     Pulses: Normal pulses.     Heart sounds: Normal heart sounds. No murmur.  Pulmonary:     Effort: Pulmonary effort is normal.     Breath sounds: Normal breath sounds. No wheezing.  Chest:     Breasts:        Right: No mass.        Left: No mass.  Abdominal:     General: Bowel sounds are normal.     Palpations: Abdomen is soft.     Tenderness: There is no abdominal tenderness.  Musculoskeletal:     Cervical back: Normal range of motion and neck supple.     Right hip: Normal.     Left hip: Normal.     Right knee: No swelling, effusion or ecchymosis.     Left knee: No swelling, effusion or ecchymosis.     Right lower leg: No edema.     Left lower leg: No edema.  Lymphadenopathy:     Cervical: No cervical adenopathy.  Skin:    General: Skin is warm and dry.     Capillary Refill: Capillary refill takes less than 2 seconds.  Neurological:     General: No  focal deficit present.     Mental Status: He is alert and oriented to person, place, and time.     Deep Tendon Reflexes: Reflexes are normal and symmetric.  Psychiatric:        Mood and Affect: Mood normal.  Speech: Speech normal.        Behavior: Behavior normal.     Wt Readings from Last 3 Encounters:  04/05/20 183 lb (83 kg)  10/24/19 189 lb 3.2 oz (85.8 kg)  09/25/19 186 lb (84.4 kg)    BP 112/80   Pulse 94   Temp 98.7 F (37.1 C) (Oral)   Ht 5\' 8"  (1.727 m)   Wt 183 lb (83 kg)   SpO2 94%   BMI 27.83 kg/m   Assessment and Plan: 1. Annual physical exam Normal exam Continue healthy diet, exercise as able - POCT urinalysis dipstick  2. Prostate cancer screening Seen within the past year by Urology PSA value not available today Pt denies concerns so DRE deferred  3. Essential hypertension Clinically stable exam with well controlled BP on losartan. Tolerating medications without side effects at this time. ARB therapy mainly for renal protection in DM. Pt to continue current regimen and low sodium diet; benefits of regular exercise as able discussed.  4. Type II diabetes mellitus with complication ([place]) Followed by Endo. On 4 medications with an A1C that does not accurately reflect his BS readings He is tapering glimepiride to due hypoglycemia incidents and only using it as needed. Foot exam is completed today Eye exam is UTD  5. Hyperlipidemia associated with type 2 diabetes mellitus ([place]) Tolerating statin medication without side effects at this time, along with fibrates and zetia LDL is at goal of < 70 on current dose Continue same therapy without change at this time.  6. Chronic iron deficiency anemia CBC not available today.  Pt will forward the result and consider another Hematology evaluation if worsening Continue B12 and oral Fe tid.  7. Chronic pain of right knee Can continue to use Tramadol prn - previous Rx May need to see Ortho if  worsening   Partially dictated using Editor, commissioning. Any errors are unintentional.  Halina Maidens, MD [facility]  04/05/2020

## 2020-05-11 ENCOUNTER — Other Ambulatory Visit: Payer: Self-pay | Admitting: Internal Medicine

## 2020-05-11 DIAGNOSIS — D509 Iron deficiency anemia, unspecified: Secondary | ICD-10-CM

## 2020-05-11 NOTE — Telephone Encounter (Signed)
Requested Prescriptions  Pending Prescriptions Disp Refills  . ferrous gluconate (FERGON) 324 MG tablet [Pharmacy Med Name: FERROUS GLUC 324MG  TABLETS] 858 tablet 1    Sig: TAKE 1 TABLET(324 MG) BY MOUTH THREE TIMES DAILY WITH MEALS     Endocrinology:  Minerals - Iron Supplementation Passed - 05/11/2020  5:19 PM      Passed - HGB in normal range and within 360 days    Hemoglobin  Date Value Ref Range Status  06/02/2019 13.5 13.0 - 17.7 g/dL Final         Passed - HCT in normal range and within 360 days    Hematocrit  Date Value Ref Range Status  06/02/2019 40.0 37.5 - 51.0 % Final         Passed - RBC in normal range and within 360 days    RBC  Date Value Ref Range Status  06/02/2019 4.61 4.14 - 5.80 x10E6/uL Final  07/31/2018 3.92 (L) 4.40 - 5.90 MIL/uL Final         Passed - Fe (serum) in normal range and within 360 days    Iron  Date Value Ref Range Status  11/16/2019 57 38 - 169 ug/dL Final   Iron Saturation  Date Value Ref Range Status  11/16/2019 15 15 - 55 % Final         Passed - Ferritin in normal range and within 360 days    Ferritin  Date Value Ref Range Status  11/16/2019 53 30.0 - 400.0 ng/mL Final         Passed - Valid encounter within last 12 months    Recent Outpatient Visits          1 month ago Annual physical exam   [facility] Glean Hess, [facility]   7 months ago Type II diabetes mellitus with complication [facility])   [facility] Glean Hess, MD   11 months ago Type II diabetes mellitus with complication [facility])   [facility] Glean Hess, MD      Future Appointments            In 1 month Hilty, Nadean Corwin, MD [facility] [place], [facility]   In 11 months Army Melia, Jesse Sans, MD [facility], [place]

## 2020-06-03 ENCOUNTER — Other Ambulatory Visit: Payer: Self-pay | Admitting: Orthopedic Surgery

## 2020-06-03 DIAGNOSIS — S83241D Other tear of medial meniscus, current injury, right knee, subsequent encounter: Secondary | ICD-10-CM

## 2020-06-05 ENCOUNTER — Ambulatory Visit
Admission: RE | Admit: 2020-06-05 | Discharge: 2020-06-05 | Disposition: A | Discharge status: Home / Self Care | Payer: Managed Care, Other (non HMO) | Source: Ambulatory Visit | Attending: Orthopedic Surgery | Admitting: Orthopedic Surgery

## 2020-06-05 ENCOUNTER — Other Ambulatory Visit: Payer: Self-pay

## 2020-06-05 DIAGNOSIS — S83241D Other tear of medial meniscus, current injury, right knee, subsequent encounter: Secondary | ICD-10-CM | POA: Diagnosis not present

## 2020-06-17 ENCOUNTER — Ambulatory Visit: Payer: Managed Care, Other (non HMO)

## 2020-06-18 ENCOUNTER — Other Ambulatory Visit: Payer: Self-pay | Admitting: Internal Medicine

## 2020-06-18 DIAGNOSIS — E118 Type 2 diabetes mellitus with unspecified complications: Secondary | ICD-10-CM

## 2020-06-18 NOTE — Telephone Encounter (Signed)
Requested Prescriptions  Pending Prescriptions Disp Refills   pioglitazone (ACTOS) 30 MG tablet [Pharmacy Med Name: PIOGLITAZONE 30MG TABLETS] 90 tablet 1    Sig: TAKE 1 TABLET(30 MG) BY MOUTH DAILY     Endocrinology:  Diabetes - Glitazones - pioglitazone Passed - 06/18/2020  7:26 PM      Passed - HBA1C is between 0 and 7.9 and within 180 days    Hemoglobin A1C  Date Value Ref Range Status  02/26/2020 7.9  Final         Passed - Valid encounter within last 6 months    Recent Outpatient Visits          2 months ago Annual physical exam   [facility] Glean Hess, MD   8 months ago Type II diabetes mellitus with complication [facility])   [facility] Glean Hess, MD   1 year ago Type II diabetes mellitus with complication [facility])   [facility] Glean Hess, MD      Future Appointments            In 1 week Hilty, Nadean Corwin, MD [facility], [facility]   In 9 months Army Melia, Jesse Sans, MD [facility], [place]            metFORMIN (GLUCOPHAGE) 850 MG tablet [Pharmacy Med Name: METFORMIN 850MG TABLETS] 270 tablet 1    Sig: TAKE 1 TABLET(850 MG) BY MOUTH THREE TIMES DAILY WITH MEALS     Endocrinology:  Diabetes - Biguanides Failed - 06/18/2020  7:26 PM      Failed - Cr in normal range and within 360 days    Creatinine  Date Value Ref Range Status  02/26/2020 1.4 (A) 0.6 - 1.3 Final  09/01/2014 1.40 (H) 0.60 - 1.30 mg/dL Final   Creatinine, Ser  Date Value Ref Range Status  09/25/2019 1.25 0.76 - 1.27 mg/dL Final         Passed - HBA1C is between 0 and 7.9 and within 180 days    Hemoglobin A1C  Date Value Ref Range Status  02/26/2020 7.9  Final         Passed - eGFR in normal range and within 360 days    EGFR (African American)  Date Value Ref Range Status  09/01/2014 >60 >64m/min Final   GFR calc Af Amer  Date Value Ref Range Status  09/25/2019 75 >59 mL/min/1.73 Final   EGFR (Non-African Amer.)  Date Value  Ref Range Status  09/01/2014 57 (L) >658mmin Final    Comment:    eGFR values <6064min/1.73 m2 may be an indication of chronic kidney disease (CKD). Calculated eGFR, using the MRDR Study equation, is useful in  patients with stable renal function. The eGFR calculation will not be reliable in acutely ill patients when serum creatinine is changing rapidly. It is not useful in patients on dialysis. The eGFR calculation may not be applicable to patients at the low and high extremes of body sizes, pregnant women, and vegetarians.    GFR calc non Af Amer  Date Value Ref Range Status  02/26/2020 53  Final         Passed - Valid encounter within last 6 months    Recent Outpatient Visits          2 months ago [facility]   8 months ago [facility]   1 year ago Type II diabetes mellitus with complication [facility])   [facility] Glean Hess, MD      Future Appointments            In 1 week Hilty, Nadean Corwin, MD [facility] [place], [place]   In 9 months Army Melia, Jesse Sans, MD [facility], [facility]

## 2020-06-26 ENCOUNTER — Other Ambulatory Visit: Payer: Self-pay | Admitting: Orthopedic Surgery

## 2020-06-27 ENCOUNTER — Encounter: Payer: Self-pay | Admitting: Internal Medicine

## 2020-06-27 ENCOUNTER — Ambulatory Visit (INDEPENDENT_AMBULATORY_CARE_PROVIDER_SITE_OTHER): Payer: Managed Care, Other (non HMO) | Admitting: Internal Medicine

## 2020-06-27 ENCOUNTER — Other Ambulatory Visit: Payer: Self-pay

## 2020-06-27 VITALS — BP 108/74 | HR 88 | Ht 68.0 in | Wt 184.4 lb

## 2020-06-27 DIAGNOSIS — E781 Pure hyperglyceridemia: Secondary | ICD-10-CM

## 2020-06-27 DIAGNOSIS — E785 Hyperlipidemia, unspecified: Secondary | ICD-10-CM

## 2020-06-27 DIAGNOSIS — R931 Abnormal findings on diagnostic imaging of heart and coronary circulation: Secondary | ICD-10-CM | POA: Diagnosis not present

## 2020-06-27 DIAGNOSIS — E1100 Type 2 diabetes mellitus with hyperosmolarity without nonketotic hyperglycemic-hyperosmolar coma (NKHHC): Secondary | ICD-10-CM

## 2020-06-27 DIAGNOSIS — I10 Essential (primary) hypertension: Secondary | ICD-10-CM | POA: Diagnosis not present

## 2020-06-27 LAB — LIPID PANEL
Chol/HDL Ratio: 4.6 ratio (ref 0.0–5.0)
Cholesterol, Total: 134 mg/dL (ref 100–199)
HDL: 29 mg/dL — ABNORMAL LOW (ref >39)
LDL Chol Calc (NIH): 70 mg/dL (ref 0–99)
Triglycerides: 213 mg/dL — ABNORMAL HIGH (ref 0–149)
VLDL Cholesterol Cal: 35 mg/dL (ref 5–40)

## 2020-06-27 NOTE — Patient Instructions (Signed)
Medication Instructions:  Your physician recommends that you continue on your current medications as directed. Please refer to the Current Medication list given to you today.  *If you need a refill on your cardiac medications before your next appointment, please call your pharmacy*   Lab Work: FASTING lipid panel before your next visit in 1 year  If you have labs (blood work) drawn today and your tests are completely normal, you will receive your results only by: Marland Kitchen MyChart Message (if you have MyChart) OR . A paper copy in the mail If you have any lab test that is abnormal or we need to change your treatment, we will call you to review the results.   Testing/Procedures: NONE   Follow-Up: At [facility], you and your health needs are our priority.  As part of our continuing mission to provide you with exceptional heart care, we have created designated Provider Care Teams.  These Care Teams include your primary Cardiologist (physician) and Advanced Practice Providers (APPs -  Physician Assistants and Nurse Practitioners) who all work together to provide you with the care you need, when you need it.  We recommend signing up for the patient portal called "MyChart".  Sign up information is provided on this After Visit Summary.  MyChart is used to connect with patients for Virtual Visits (Telemedicine).  Patients are able to view lab/test results, encounter notes, upcoming appointments, etc.  Non-urgent messages can be sent to your provider as well.   To learn more about what you can do with MyChart, go to [email or web address].    Your next appointment:   12 month(s) - lipid clinic  The format for your next appointment:   In Person or Virtual  Provider:   K. Mali Hilty, MD   Other Instructions

## 2020-06-27 NOTE — Progress Notes (Signed)
LIPID CLINIC CONSULT NOTE  Chief Complaint:  Follow-up dyslipidemia  Primary Care Physician: Glean Hess, MD  Primary Cardiologist:  No primary care provider on file.  HPI:  Jesse Alvarado is a 54 y.o. male who is being seen today for the evaluation of manage dyslipidemia at the request of Glean Hess, MD.  This is a pleasant 54 year old male was kindly referred for evaluation and management of dyslipidemia.  He is a patient that is followed by my partner Dr. Percival Spanish and recently was noted to have a significantly elevated cholesterol profile.  His total cholesterol was 242 with triglycerides of 267, HDL 37 and LDL of 151.  He currently takes pravastatin 40 mg daily and fenofibrate 145 mg daily. Other medical problems include type 2 diabetes (non-insulin-dependent), chronic kidney disease, GERD, hypertension, OSA on CPAP and nephrolithiasis.  He was noted to have coronary artery calcification on his CT scan and was therefore referred to Dr. Percival Spanish for further evaluation and management.  LDL in October was 118 however most recently LDL was elevated further at 151.  He does report compliance with his medication.  07/24/2019  Jesse Alvarado returns today for follow-up dyslipidemia.  Overall he reports feeling well on medications.  He has had significant improvement in his numbers.  Total cholesterol now 158, triglycerides 96 (decreased from 314) and HDL 38, LDL 101.  Although we have targeted LDL less than 70, he has had significant improvement particularly in triglycerides on Vascepa.  He is also made significant dietary changes as well.  10/24/2019  Jesse Alvarado is seen today in follow-up.  Overall he has improved his cholesterol profile further.  His most recent showed total cholesterol 134, triglycerides 78, HDL 39 and LDL 81 (improved from 101).  Diet has played a big role in this.  He continues compliance with atorvastatin and Vascepa.  His target LDL in my mind would be less  than 70 given diabetes and known coronary disease.  We had previously discussed adding ezetimibe and he seems amenable to it.  06/27/2020  Jesse Alvarado returns for follow-up.  Overall he is doing well although is likely fatigued as he is working about 5060 hours a week.  He expects this to go on for about another 2 years due to his position.  His lipids have been improved 4 months ago with total cholesterol 117, triglycerides 121, HDL 34 and LDL 59.  After adding ezetimibe.  He had repeat lipids drawn this morning which are not yet available.  PMHx:  Past Medical History:  Diagnosis Date  . Anemia    taking 3 iron pills a day  . Anemia   . Arthritis   . Asthma    sports induced asthma. takes inhalers when needed  . Barrett's esophagus without dysplasia   . Cancer (HCC)    Basal cell  . Chronic kidney disease    kidney stones  . Complication of anesthesia    high tolerance to pain medication. body absorbs pain med quickly  . Cough on exercise   . Diabetes mellitus without complication ([place])   . Diverticulosis   . Erectile dysfunction   . Fatty liver   . GERD (gastroesophageal reflux disease)   . History of kidney stones   . Hyperlipidemia   . Hypertension    per patient, he does not have high bp but is treated for his kidneys and his diabetes  . Kidney stone   . Pancreatitis   . Right shoulder  injury   . Sleep apnea    use C-PAP  . Wears hearing aid in both ears     Past Surgical History:  Procedure Laterality Date  . CARPAL TUNNEL RELEASE Right 10/03/2018   Procedure: CARPAL TUNNEL RELEASE;  Surgeon: Earnestine Leys, MD;  Location: [facility];  Service: Orthopedics;  Laterality: Right;  . CARPAL TUNNEL RELEASE Left 10/24/2018   Procedure: CARPAL TUNNEL RELEASE;  Surgeon: Earnestine Leys, MD;  Location: [facility];  Service: Orthopedics;  Laterality: Left;  . COLONOSCOPY    . COLONOSCOPY WITH PROPOFOL N/A 08/30/2019   Procedure: COLONOSCOPY WITH PROPOFOL;  Surgeon: Toledo,  Benay Pike, MD;  Location: [facility];  Service: Gastroenterology;  Laterality: N/A;  . CYSTOSCOPY WITH STENT PLACEMENT Bilateral 06/05/2016   Procedure: CYSTOSCOPY WITH STENT PLACEMENT;  Surgeon: Nickie Retort, MD;  Location: [facility];  Service: Urology;  Laterality: Bilateral;  . CYSTOSCOPY/URETEROSCOPY/HOLMIUM LASER/STENT PLACEMENT Left 04/13/2016   Procedure: CYSTOSCOPY/RETROGRADE PYELOGRAM/URETEROSCOPY WITH HOLMIUM LASER LITHOTRIPSY//STENT PLACEMENT;  Surgeon: Festus Aloe, MD;  Location: [facility];  Service: Urology;  Laterality: Left;  . ESOPHAGOGASTRODUODENOSCOPY (EGD) WITH PROPOFOL N/A 12/27/2015   Procedure: ESOPHAGOGASTRODUODENOSCOPY (EGD) WITH PROPOFOL;  Surgeon: Manya Silvas, MD;  Location: [facility];  Service: Endoscopy;  Laterality: N/A;  . ESOPHAGOGASTRODUODENOSCOPY (EGD) WITH PROPOFOL N/A 08/30/2019   Procedure: ESOPHAGOGASTRODUODENOSCOPY (EGD) WITH PROPOFOL;  Surgeon: Toledo, Benay Pike, MD;  Location: [facility];  Service: Gastroenterology;  Laterality: N/A;  . EXTRACORPOREAL SHOCK WAVE LITHOTRIPSY Right 12/30/2017   Procedure: EXTRACORPOREAL SHOCK WAVE LITHOTRIPSY (ESWL);  Surgeon: Royston Cowper, MD;  Location: [facility];  Service: Urology;  Laterality: Right;  . HERNIA REPAIR  4401   umbilical  . KNEE ARTHROSCOPY Right 2015   had bursa sack repaired  . KNEE ARTHROSCOPY WITH MENISCAL REPAIR Right 11/18/2018   Procedure: KNEE ARTHROSCOPY WITH MENISCAL REPAIR AND CHONDROPLASTY;  Surgeon: Leim Fabry, MD;  Location: [place];  Service: Orthopedics;  Laterality: Right;  Diabetic - oral meds sleep apnea SUPINE WITH ACL LEG HOLDER SMITH AND NEWPHEW CETERIX  . PREPATELLAR BURSA EXCISION Left 1993   and ostetomy. had at least 5 surgeries in 15 years  . SHOULDER SURGERY Right 2011   tendon was shredded and was trimmed, repaired and reattached. Screws in shoulder  . URETEROSCOPY WITH HOLMIUM LASER LITHOTRIPSY Bilateral 06/05/2016   Procedure:  URETEROSCOPY WITH HOLMIUM LASER LITHOTRIPSY;  Surgeon: Nickie Retort, MD;  Location: [facility];  Service: Urology;  Laterality: Bilateral;  . VASECTOMY  2002    FAMHx:  Family History  Problem Relation Age of Onset  . Urolithiasis Father   . Kidney disease Father   . Prostate cancer Neg Hx   . Kidney cancer Neg Hx     SOCHx:   reports that he has never smoked. He has never used smokeless tobacco. He reports that he does not drink alcohol and does not use drugs.  ALLERGIES:  Allergies  Allergen Reactions  . Dulaglutide Other (See Comments)    Trulicity- Caused pancreatitis   . Monosodium Glutamate Diarrhea    MSG  . Ciprofloxacin Other (See Comments)    GI upset    ROS: Pertinent items noted in HPI and remainder of comprehensive ROS otherwise negative.  HOME MEDS: Current Outpatient Medications on File Prior to Visit  Medication Sig Dispense Refill  . amitriptyline (ELAVIL) 25 MG tablet Take 25 mg by mouth at bedtime. Recurring Cough- Dr Joya Gaskins-  11  . aspirin EC 81 MG tablet Take 81 mg by  mouth daily.    Marland Kitchen atorvastatin (LIPITOR) 80 MG tablet TAKE 1 TABLET(80 MG) BY MOUTH DAILY 90 tablet 2  . Azelastine-Fluticasone (DYMISTA) 137-50 MCG/ACT SUSP Place 1 spray into both nostrils 2 (two) times daily.     . calcium citrate (CALCITRATE - DOSED IN MG ELEMENTAL CALCIUM) 950 MG tablet Take 200 mg of elemental calcium by mouth 3 (three) times daily. TUMS    . ciclopirox (PENLAC) 8 % solution Apply 1 application topically at bedtime. Apply over nail and surrounding skin. Apply daily over previous coat. After seven (7) days, may remove with alcohol and continue cycle. 6.6 mL 5  . cyanocobalamin (,VITAMIN B-12,) 1000 MCG/ML injection Inject 1 mL (1,000 mcg total) into the muscle every 30 (thirty) days. 1 mL 5  . dapagliflozin propanediol (FARXIGA) 10 MG TABS tablet Take 10 mg by mouth daily.    . fenofibrate (TRICOR) 145 MG tablet Take 1 tablet (145 mg total) by mouth daily. 90 tablet  3  . ferrous gluconate (FERGON) 324 MG tablet TAKE 1 TABLET(324 MG) BY MOUTH THREE TIMES DAILY WITH MEALS 789 tablet 1  . folic acid (FOLVITE) 1 MG tablet TAKE 1 TABLET(1 MG) BY MOUTH DAILY 90 tablet 1  . gabapentin (NEURONTIN) 300 MG capsule Take 300 mg by mouth 3 (three) times daily. For chronic cough/laryngeal inflammation    . glimepiride (AMARYL) 2 MG tablet Take 4 tablets (8 mg total) by mouth See admin instructions. Take 2mg  with breakfast, 2mg  with lunch, and 4mg  with supper. (Patient taking differently: Take 2 mg by mouth 2 (two) times daily. One in the morning and one in the evening) 360 tablet 1  . ipratropium (ATROVENT) 0.06 % nasal spray Place 2 sprays into both nostrils 2 (two) times daily.   3  . ketoconazole (NIZORAL) 2 % shampoo WASH SCALP EVERY OTHER WASHING ALTERNATING WITH HEAD AND SHOULDERS. LET SIT SEVERAL MINUTES BEFORE RINSING.    Marland Kitchen levocetirizine (XYZAL) 5 MG tablet Take 5 mg by mouth every evening.   11  . losartan (COZAAR) 50 MG tablet Take 1 tablet (50 mg total) by mouth daily. 90 tablet 1  . meloxicam (MOBIC) 15 MG tablet Take 15 mg by mouth daily.    . metFORMIN (GLUCOPHAGE) 850 MG tablet TAKE 1 TABLET(850 MG) BY MOUTH THREE TIMES DAILY WITH MEALS 270 tablet 1  . Multiple Vitamin (MULTI-VITAMINS) TABS Take 1 tablet by mouth 3 (three) times daily.     . pantoprazole (PROTONIX) 40 MG tablet Take 1 tablet (40 mg total) by mouth 2 (two) times daily. 180 tablet 1  . pioglitazone (ACTOS) 30 MG tablet TAKE 1 TABLET(30 MG) BY MOUTH DAILY 90 tablet 1  . polyethylene glycol-electrolytes (NULYTELY/GOLYTELY) 420 g solution STOOL SOFT.    Marland Kitchen PROAIR RESPICLICK 381 (90 Base) MCG/ACT AEPB Inhale 2 puffs into the lungs every 6 (six) hours as needed (shortness of breath).   0  . sucralfate (CARAFATE) 1 g tablet Take 1 tablet (1 g total) by mouth 3 (three) times daily with meals. 270 tablet 1  . SYMBICORT 160-4.5 MCG/ACT inhaler Inhale 2 puffs into the lungs 2 (two) times daily.   3  .  tadalafil (CIALIS) 5 MG tablet Take 5 mg by mouth daily as needed for erectile dysfunction.    Marland Kitchen tiZANidine (ZANAFLEX) 2 MG tablet Take 2-4 mg by mouth at bedtime as needed for muscle spasms.     . traMADol (ULTRAM) 50 MG tablet Take 50 mg by mouth every 6 (six) hours  as needed. As needed for knee pain s/p arthroscopy    . VASCEPA 1 g capsule TAKE 2 CAPSULES(2 GRAMS) BY MOUTH TWICE DAILY 120 capsule 8  . vitamin C (ASCORBIC ACID) 500 MG tablet Take 500 mg by mouth daily.    Marland Kitchen ezetimibe (ZETIA) 10 MG tablet Take 1 tablet (10 mg total) by mouth daily. 90 tablet 3   No current facility-administered medications on file prior to visit.    LABS/IMAGING: No results found for this or any previous visit (from the past 48 hour(s)). No results found.  LIPID PANEL:    Component Value Date/Time   CHOL 117 02/26/2020 0000   CHOL 134 10/16/2019 0828   TRIG 121 02/26/2020 0000   HDL 34 (A) 02/26/2020 0000   HDL 39 (L) 10/16/2019 0828   CHOLHDL 3.4 10/16/2019 0828   CHOLHDL 5.9 07/30/2018 0513   VLDL 63 (H) 07/30/2018 0513   LDLCALC 59 02/26/2020 0000   LDLCALC 81 10/16/2019 0828    WEIGHTS: Wt Readings from Last 3 Encounters:  06/27/20 184 lb 6.4 oz (83.6 kg)  04/05/20 183 lb (83 kg)  10/24/19 189 lb 3.2 oz (85.8 kg)    VITALS: BP 108/74   Pulse 88   Ht 5\' 8"  (1.727 m)   Wt 184 lb 6.4 oz (83.6 kg)   BMI 28.04 kg/m   EXAM: Deferred  EKG: Deferred  ASSESSMENT: 1. Mixed dyslipidemia 2. High coronary artery calcium score 3. Essential hypertension 4. Type 2 diabetes (not on insulin)  PLAN: 1.   Jesse Alvarado was at goal LDL after adding ezetimibe 4 months ago.  Repeat lipids were drawn this morning.  I will follow up with those.  If he remains at target then will follow up with him annually or sooner as necessary.  Pixie Casino, MD, [facility], [place] Director of the Advanced Lipid Disorders &  Cardiovascular Risk Reduction Clinic Diplomate of  the American Board of Clinical Lipidology Attending Cardiologist  Direct Dial: [phone]  Fax: [phone]  Website:  [email or web address]  Nadean Corwin Kenyan Karnes 06/27/2020, 8:22 AM

## 2020-07-04 ENCOUNTER — Other Ambulatory Visit: Payer: Self-pay | Admitting: Internal Medicine

## 2020-07-04 DIAGNOSIS — E118 Type 2 diabetes mellitus with unspecified complications: Secondary | ICD-10-CM

## 2020-07-04 DIAGNOSIS — E781 Pure hyperglyceridemia: Secondary | ICD-10-CM

## 2020-07-04 NOTE — Telephone Encounter (Signed)
Requested  medications are due for refill today yes  Requested medications are on the active medication list yes  Last refill 7/22  Last visit 5/28  Future visit scheduled yes   Notes to clinic No protocol

## 2020-07-05 ENCOUNTER — Other Ambulatory Visit: Payer: Self-pay | Admitting: Internal Medicine

## 2020-07-05 DIAGNOSIS — E118 Type 2 diabetes mellitus with unspecified complications: Secondary | ICD-10-CM

## 2020-07-19 ENCOUNTER — Other Ambulatory Visit: Payer: Self-pay | Admitting: Internal Medicine

## 2020-07-19 DIAGNOSIS — E118 Type 2 diabetes mellitus with unspecified complications: Secondary | ICD-10-CM

## 2020-07-19 NOTE — Telephone Encounter (Signed)
Requested  medications are  due for refill today yes  Requested medications are on the active medication list yes  Last refill 7/22  Last visit 03/2020  Future visit scheduled Yes 04/2021  Notes to clinic This medication does not have a protocol for me to follow, please assess.

## 2020-07-22 ENCOUNTER — Other Ambulatory Visit: Payer: Self-pay | Admitting: Internal Medicine

## 2020-07-22 DIAGNOSIS — E118 Type 2 diabetes mellitus with unspecified complications: Secondary | ICD-10-CM

## 2020-07-22 NOTE — Telephone Encounter (Signed)
Patient is frustrated and states cyanocobalamin (,VITAMIN B-12,) 1000 MCG/ML injection was denied and unclear why, as per patient wife please send rx to the new pharmacy mentioned below.   Please call patient today when complted and leave a detail message   Jesse Alvarado, [place], [place] [address] : [phone]

## 2020-07-23 ENCOUNTER — Telehealth: Payer: Self-pay | Admitting: Internal Medicine

## 2020-07-23 ENCOUNTER — Other Ambulatory Visit: Payer: Self-pay

## 2020-07-23 DIAGNOSIS — E118 Type 2 diabetes mellitus with unspecified complications: Secondary | ICD-10-CM

## 2020-07-23 MED ORDER — CYANOCOBALAMIN 1000 MCG/ML IJ SOLN: INTRAMUSCULAR | 2 refills | Status: DC

## 2020-07-23 NOTE — Telephone Encounter (Unsigned)
Copied from [place] [phone]. Topic: General - Call Back - No Documentation >> Jul 23, 2020 12:22 PM Erick Blinks wrote: Reason for CRM: Pt called, is frustrated, and would like a call back from chassidy regarding his B12 injection [phone]

## 2020-07-23 NOTE — Telephone Encounter (Signed)
Pt said he was frustrated with his previous pharmacy [facility] and [facility] a [facility]. Updated this in his chart and sent the b12 injections to [facility]

## 2020-07-23 NOTE — Telephone Encounter (Signed)
Updated patients pharmacy to [facility], [place]

## 2020-07-25 ENCOUNTER — Other Ambulatory Visit: Payer: Self-pay | Admitting: Internal Medicine

## 2020-07-25 DIAGNOSIS — D509 Iron deficiency anemia, unspecified: Secondary | ICD-10-CM

## 2020-08-14 ENCOUNTER — Other Ambulatory Visit: Payer: Self-pay | Admitting: Internal Medicine

## 2020-08-14 DIAGNOSIS — K2270 Barrett's esophagus without dysplasia: Secondary | ICD-10-CM

## 2020-08-16 LAB — TESTOSTERONE: Testosterone: 310

## 2020-08-16 LAB — PSA: PSA: 0.62

## 2020-08-16 LAB — CBC AND DIFFERENTIAL
HCT: 39 — AB (ref 41–53)
Hemoglobin: 13 — AB (ref 13.5–17.5)

## 2020-08-22 ENCOUNTER — Other Ambulatory Visit: Payer: Self-pay | Admitting: Internal Medicine

## 2020-08-22 DIAGNOSIS — K2270 Barrett's esophagus without dysplasia: Secondary | ICD-10-CM

## 2020-08-23 ENCOUNTER — Other Ambulatory Visit: Payer: Self-pay | Admitting: Internal Medicine

## 2020-08-23 DIAGNOSIS — K2270 Barrett's esophagus without dysplasia: Secondary | ICD-10-CM

## 2020-08-23 MED ORDER — PANTOPRAZOLE SODIUM 40 MG PO TBEC: 40.0000 mg | DELAYED_RELEASE_TABLET | Freq: Two times a day (BID) | ORAL | 1 refills | Status: DC

## 2020-08-26 ENCOUNTER — Other Ambulatory Visit: Payer: Self-pay | Admitting: Internal Medicine

## 2020-08-26 DIAGNOSIS — K2270 Barrett's esophagus without dysplasia: Secondary | ICD-10-CM

## 2020-08-26 MED ORDER — PANTOPRAZOLE SODIUM 40 MG PO TBEC: 40.0000 mg | DELAYED_RELEASE_TABLET | Freq: Two times a day (BID) | ORAL | 1 refills | Status: DC

## 2020-08-26 NOTE — Telephone Encounter (Signed)
Refill order of 08/23/20 did not process, resent

## 2020-08-26 NOTE — Telephone Encounter (Signed)
Medication Refill - Medication: pantoprazole (PROTONIX) 40 MG tablet (Pharmacy has requested that a new prescription be sent over in order to fill medication.)   Has the patient contacted their pharmacy? yes (Agent: If no, request that the patient contact the pharmacy for the refill.) (Agent: If yes, when and what did the pharmacy advise?)Contact PCP  Preferred Pharmacy (with phone number or street name):  [id number] - [place], [place] - [place] Phone:  [phone]  Fax:  [phone]       Agent: Please be advised that RX refills may take up to 3 business days. We ask that you follow-up with your pharmacy.

## 2020-10-02 ENCOUNTER — Other Ambulatory Visit: Payer: Self-pay

## 2020-10-02 ENCOUNTER — Encounter: Payer: Self-pay | Admitting: Orthopedic Surgery

## 2020-10-09 ENCOUNTER — Other Ambulatory Visit
Admission: RE | Admit: 2020-10-09 | Discharge: 2020-10-09 | Disposition: A | Discharge status: Home / Self Care | Payer: Managed Care, Other (non HMO) | Source: Ambulatory Visit | Attending: Orthopedic Surgery | Admitting: Orthopedic Surgery

## 2020-10-09 ENCOUNTER — Other Ambulatory Visit: Payer: Self-pay

## 2020-10-09 DIAGNOSIS — Z20822 Contact with and (suspected) exposure to covid-19: Secondary | ICD-10-CM | POA: Diagnosis not present

## 2020-10-09 DIAGNOSIS — Z01812 Encounter for preprocedural laboratory examination: Secondary | ICD-10-CM | POA: Diagnosis not present

## 2020-10-09 LAB — SARS CORONAVIRUS 2 (TAT 6-24 HRS): SARS Coronavirus 2: NEGATIVE

## 2020-10-09 LAB — HEMOGLOBIN A1C: Hemoglobin A1C: 6.5

## 2020-10-11 ENCOUNTER — Ambulatory Visit: Payer: Managed Care, Other (non HMO) | Admitting: Anesthesiology

## 2020-10-11 ENCOUNTER — Ambulatory Visit
Admission: RE | Admit: 2020-10-11 | Discharge: 2020-10-11 | Disposition: A | Discharge status: Home / Self Care | Payer: Managed Care, Other (non HMO) | Attending: Orthopedic Surgery | Admitting: Orthopedic Surgery

## 2020-10-11 ENCOUNTER — Encounter: Payer: Self-pay | Admitting: Orthopedic Surgery

## 2020-10-11 ENCOUNTER — Encounter
Admission: RE | Disposition: A | Discharge status: Home / Self Care | Payer: Self-pay | Source: Home / Self Care | Attending: Orthopedic Surgery

## 2020-10-11 ENCOUNTER — Other Ambulatory Visit: Payer: Self-pay

## 2020-10-11 DIAGNOSIS — Z881 Allergy status to other antibiotic agents status: Secondary | ICD-10-CM | POA: Diagnosis not present

## 2020-10-11 DIAGNOSIS — Z888 Allergy status to other drugs, medicaments and biological substances status: Secondary | ICD-10-CM | POA: Diagnosis not present

## 2020-10-11 DIAGNOSIS — M23221 Derangement of posterior horn of medial meniscus due to old tear or injury, right knee: Secondary | ICD-10-CM | POA: Insufficient documentation

## 2020-10-11 DIAGNOSIS — Z7982 Long term (current) use of aspirin: Secondary | ICD-10-CM | POA: Diagnosis not present

## 2020-10-11 DIAGNOSIS — Z79899 Other long term (current) drug therapy: Secondary | ICD-10-CM | POA: Diagnosis not present

## 2020-10-11 DIAGNOSIS — M238X1 Other internal derangements of right knee: Secondary | ICD-10-CM | POA: Insufficient documentation

## 2020-10-11 DIAGNOSIS — Z7984 Long term (current) use of oral hypoglycemic drugs: Secondary | ICD-10-CM | POA: Diagnosis not present

## 2020-10-11 DIAGNOSIS — Z833 Family history of diabetes mellitus: Secondary | ICD-10-CM | POA: Diagnosis not present

## 2020-10-11 DIAGNOSIS — Z801 Family history of malignant neoplasm of trachea, bronchus and lung: Secondary | ICD-10-CM | POA: Diagnosis not present

## 2020-10-11 DIAGNOSIS — Z800 Family history of malignant neoplasm of digestive organs: Secondary | ICD-10-CM | POA: Diagnosis not present

## 2020-10-11 DIAGNOSIS — Z808 Family history of malignant neoplasm of other organs or systems: Secondary | ICD-10-CM | POA: Insufficient documentation

## 2020-10-11 DIAGNOSIS — Z7951 Long term (current) use of inhaled steroids: Secondary | ICD-10-CM | POA: Diagnosis not present

## 2020-10-11 DIAGNOSIS — Z9889 Other specified postprocedural states: Secondary | ICD-10-CM | POA: Insufficient documentation

## 2020-10-11 HISTORY — PX: KNEE ARTHROSCOPY WITH MEDIAL MENISECTOMY: SHX5651

## 2020-10-11 LAB — GLUCOSE, CAPILLARY
Glucose-Capillary: 110 mg/dL — ABNORMAL HIGH (ref 70–99)
Glucose-Capillary: 97 mg/dL (ref 70–99)

## 2020-10-11 SURGERY — ARTHROSCOPY, KNEE, WITH MEDIAL MENISCECTOMY
Anesthesia: General | Site: Knee | Laterality: Right

## 2020-10-11 MED ORDER — LIDOCAINE-EPINEPHRINE 1 %-1:100000 IJ SOLN
INTRAMUSCULAR | Status: DC | PRN
  Administered 2020-10-11: 8 mL via INTRAMUSCULAR

## 2020-10-11 MED ORDER — OXYCODONE HCL 5 MG PO TABS
5.0000 mg | ORAL_TABLET | Freq: Once | ORAL | Status: AC | PRN
  Administered 2020-10-11: 5 mg via ORAL

## 2020-10-11 MED ORDER — MIDAZOLAM HCL 5 MG/5ML IJ SOLN
INTRAMUSCULAR | Status: DC | PRN
  Administered 2020-10-11: 2 mg via INTRAVENOUS

## 2020-10-11 MED ORDER — FENTANYL CITRATE (PF) 100 MCG/2ML IJ SOLN
INTRAMUSCULAR | Status: DC | PRN
  Administered 2020-10-11: 25 ug via INTRAVENOUS

## 2020-10-11 MED ORDER — FENTANYL CITRATE (PF) 100 MCG/2ML IJ SOLN
25.0000 ug | INTRAMUSCULAR | Status: DC | PRN
  Administered 2020-10-11 (×2): 50 ug via INTRAVENOUS

## 2020-10-11 MED ORDER — ACETAMINOPHEN 160 MG/5ML PO SOLN: 325.0000 mg | ORAL | Status: DC | PRN

## 2020-10-11 MED ORDER — ONDANSETRON HCL 4 MG/2ML IJ SOLN
INTRAMUSCULAR | Status: DC | PRN
  Administered 2020-10-11: 4 mg via INTRAVENOUS

## 2020-10-11 MED ORDER — ACETAMINOPHEN 325 MG PO TABS: 325.0000 mg | ORAL_TABLET | ORAL | Status: DC | PRN

## 2020-10-11 MED ORDER — LIDOCAINE HCL (CARDIAC) PF 100 MG/5ML IV SOSY
PREFILLED_SYRINGE | INTRAVENOUS | Status: DC | PRN
  Administered 2020-10-11: 60 mg via INTRATRACHEAL

## 2020-10-11 MED ORDER — LIDOCAINE-EPINEPHRINE 1 %-1:100000 IJ SOLN
INTRAMUSCULAR | Status: DC | PRN
  Administered 2020-10-11: 2 mL via INTRAMUSCULAR

## 2020-10-11 MED ORDER — IBUPROFEN 800 MG PO TABS: 800.0000 mg | ORAL_TABLET | Freq: Three times a day (TID) | ORAL | 1 refills | Status: AC

## 2020-10-11 MED ORDER — ACETAMINOPHEN 500 MG PO TABS: 1000.0000 mg | ORAL_TABLET | Freq: Three times a day (TID) | ORAL | 2 refills | Status: AC

## 2020-10-11 MED ORDER — OXYCODONE HCL 5 MG/5ML PO SOLN: 5.0000 mg | Freq: Once | ORAL | Status: AC | PRN

## 2020-10-11 MED ORDER — PROPOFOL 10 MG/ML IV BOLUS
INTRAVENOUS | Status: DC | PRN
  Administered 2020-10-11: 160 mg via INTRAVENOUS

## 2020-10-11 MED ORDER — ONDANSETRON 4 MG PO TBDP: 4.0000 mg | ORAL_TABLET | Freq: Three times a day (TID) | ORAL | 0 refills | Status: DC | PRN

## 2020-10-11 MED ORDER — CEFAZOLIN SODIUM-DEXTROSE 2-4 GM/100ML-% IV SOLN
2.0000 g | INTRAVENOUS | Status: AC
  Administered 2020-10-11: 2 g via INTRAVENOUS

## 2020-10-11 MED ORDER — LACTATED RINGERS IV SOLN: INTRAVENOUS | Status: DC

## 2020-10-11 MED ORDER — DEXAMETHASONE SODIUM PHOSPHATE 4 MG/ML IJ SOLN
INTRAMUSCULAR | Status: DC | PRN
  Administered 2020-10-11: 4 mg via INTRAVENOUS

## 2020-10-11 MED ORDER — ASPIRIN EC 325 MG PO TBEC: 325.0000 mg | DELAYED_RELEASE_TABLET | Freq: Every day | ORAL | 0 refills | Status: AC

## 2020-10-11 MED ORDER — HYDROCODONE-ACETAMINOPHEN 5-325 MG PO TABS
1.0000 | ORAL_TABLET | ORAL | 0 refills | Status: DC | PRN
Start: 2020-10-11 — End: 2021-10-31

## 2020-10-11 SURGICAL SUPPLY — 39 items
ADAPTER IRRIG TUBE 2 SPIKE SOL (ADAPTER) ×6 IMPLANT
ADPR TBG 2 SPK PMP STRL ASCP (ADAPTER) ×2
APL PRP STRL LF DISP 70% ISPRP (MISCELLANEOUS) ×1
BLADE SURG SZ11 CARB STEEL (BLADE) ×3 IMPLANT
BNDG COHESIVE 4X5 TAN STRL (GAUZE/BANDAGES/DRESSINGS) ×3 IMPLANT
BNDG ESMARK 6X12 TAN STRL LF (GAUZE/BANDAGES/DRESSINGS) ×3 IMPLANT
BUR RADIUS 3.5 (BURR) ×2 IMPLANT
BUR RADIUS 4.0X18.5 (BURR) IMPLANT
CHLORAPREP W/TINT 26 (MISCELLANEOUS) ×3 IMPLANT
COOLER POLAR GLACIER W/PUMP (MISCELLANEOUS) ×3 IMPLANT
COVER LIGHT HANDLE UNIVERSAL (MISCELLANEOUS) ×6 IMPLANT
CUFF TOURN SGL QUICK 30 (TOURNIQUET CUFF)
CUFF TRNQT CYL 30X4X21-28X (TOURNIQUET CUFF) IMPLANT
DRAPE IMP U-DRAPE 54X76 (DRAPES) ×3 IMPLANT
GAUZE SPONGE 4X4 12PLY STRL (GAUZE/BANDAGES/DRESSINGS) ×3 IMPLANT
GLOVE BIO SURGEON STRL SZ7.5 (GLOVE) ×3 IMPLANT
GLOVE BIOGEL PI IND STRL 8 (GLOVE) ×1 IMPLANT
GLOVE BIOGEL PI INDICATOR 8 (GLOVE) ×2
GOWN STRL REUS W/ TWL LRG LVL3 (GOWN DISPOSABLE) ×1 IMPLANT
GOWN STRL REUS W/TWL LRG LVL3 (GOWN DISPOSABLE) ×3
IV LACTATED RINGER IRRG 3000ML (IV SOLUTION) ×12
IV LR IRRIG 3000ML ARTHROMATIC (IV SOLUTION) ×4 IMPLANT
KIT TURNOVER KIT A (KITS) ×3 IMPLANT
MANIFOLD NEPTUNE II (INSTRUMENTS) ×3 IMPLANT
MAT ABSORB  FLUID 56X50 GRAY (MISCELLANEOUS) ×2
MAT ABSORB FLUID 56X50 GRAY (MISCELLANEOUS) ×1 IMPLANT
PACK ARTHROSCOPY KNEE (MISCELLANEOUS) ×3 IMPLANT
PAD ABD DERMACEA PRESS 5X9 (GAUZE/BANDAGES/DRESSINGS) ×2 IMPLANT
PAD WRAPON POLAR KNEE (MISCELLANEOUS) ×1 IMPLANT
PADDING CAST BLEND 6X4 STRL (MISCELLANEOUS) ×1 IMPLANT
PADDING STRL CAST 6IN (MISCELLANEOUS) ×2
SET TUBE SUCT SHAVER OUTFL 24K (TUBING) ×3 IMPLANT
SET TUBE TIP INTRA-ARTICULAR (MISCELLANEOUS) ×3 IMPLANT
SUT ETHILON 3-0 FS-10 30 BLK (SUTURE) ×3
SUTURE EHLN 3-0 FS-10 30 BLK (SUTURE) ×1 IMPLANT
TOWEL OR 17X26 4PK STRL BLUE (TOWEL DISPOSABLE) ×6 IMPLANT
TUBING ARTHRO INFLOW-ONLY STRL (TUBING) ×3 IMPLANT
WAND WEREWOLF FLOW 90D (MISCELLANEOUS) IMPLANT
WRAPON POLAR PAD KNEE (MISCELLANEOUS) ×3

## 2020-10-11 NOTE — Anesthesia Postprocedure Evaluation (Signed)
Anesthesia Post Note  Patient: Jesse Alvarado  Procedure(s) Performed: Right knee arthroscopy partial medial meniscectomy (Right Knee)     Patient location during evaluation: PACU Anesthesia Type: General Level of consciousness: awake Pain management: pain level controlled Vital Signs Assessment: post-procedure vital signs reviewed and stable Respiratory status: respiratory function stable Cardiovascular status: stable Postop Assessment: no signs of nausea or vomiting Anesthetic complications: no   No complications documented.  Veda Canning

## 2020-10-11 NOTE — H&P (Signed)
Paper H&P to be scanned into permanent record. H&P reviewed. No significant changes noted.  

## 2020-10-11 NOTE — Anesthesia Procedure Notes (Signed)
Procedure Name: LMA Insertion Date/Time: 10/11/2020 7:43 AM Performed by: Dionne Bucy, CRNA Pre-anesthesia Checklist: Patient identified, Patient being monitored, Timeout performed, Emergency Drugs available and Suction available Patient Re-evaluated:Patient Re-evaluated prior to induction Oxygen Delivery Method: Circle system utilized Preoxygenation: Pre-oxygenation with 100% oxygen Induction Type: IV induction Ventilation: Mask ventilation without difficulty LMA: LMA inserted LMA Size: 4.0 Tube type: Oral Number of attempts: 1 Placement Confirmation: positive ETCO2 and breath sounds checked- equal and bilateral Tube secured with: Tape Dental Injury: Teeth and Oropharynx as per pre-operative assessment

## 2020-10-11 NOTE — Transfer of Care (Signed)
Immediate Anesthesia Transfer of Care Note  Patient: Jesse Alvarado  Procedure(s) Performed: Right knee arthroscopy partial medial meniscectomy (Right Knee)  Patient Location: PACU  Anesthesia Type: General  Level of Consciousness: awake, alert  and patient cooperative  Airway and Oxygen Therapy: Patient Spontanous Breathing and Patient connected to supplemental oxygen  Post-op Assessment: Post-op Vital signs reviewed, Patient's Cardiovascular Status Stable, Respiratory Function Stable, Patent Airway and No signs of Nausea or vomiting  Post-op Vital Signs: Reviewed and stable  Complications: No complications documented.

## 2020-10-11 NOTE — Op Note (Signed)
Operative Note    SURGERY DATE: 10/11/2020   PRE-OP DIAGNOSIS:  1. Right medial meniscus tear 2. 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 Right medial meniscus tear 2. Right patellofemoral and medial compartment degenerative change  PROCEDURES:  1. Right knee arthroscopy, partial medial meniscectomy    SURGEON: Cato Mulligan, MD   ANESTHESIA: Gen   ESTIMATED BLOOD LOSS: minimal   TOTAL IV FLUIDS: per anesthesia   INDICATION(S):  Jesse Alvarado is a 54 y.o. male who initially had a medial meniscus repair of a posterior horn horizontal tear by me on 11/18/18. He recovered uneventfully and had returned to full activity.  However, approximately 8 months ago, he noted a gradual increase in medial sided knee pain without a known traumatic incident.  Radiographs showed well-maintained joint spaces.  A repeat MRI was obtained.  This showed a recurrent meniscus tear in the area of prior repair. After discussion of risks, benefits, and alternatives to surgery, the patient elected to proceed.   OPERATIVE FINDINGS:    Examination under anesthesia: A careful examination under anesthesia was performed.  Passive range of motion was: Hyperextension: 2.  Extension: 0.  Flexion: 140.  Lachman: normal. Pivot Shift: normal.  Posterior drawer: normal.  Varus stability in full extension: normal.  Varus stability in 30 degrees of flexion: normal.  Valgus stability in full extension: normal.  Valgus stability in 30 degrees of flexion: normal.   Intra-operative findings: A thorough arthroscopic examination of the knee was performed.  The findings are: 1. Suprapatellar pouch: Normal 2. Undersurface of median ridge:Grade 1 softening 3. Medial patellar facet:Focal area of grade 2 changes 4. Lateral patellar facet:Focal area of grade 2 changes 5. Trochlea:Grade 1 changes 6. Lateral gutter/popliteus tendon: Normal 7. Hoffa's fat JTT:SVXBLT 8. Medial  gutter/plica: Normal 9. ACL: Normal 10. PCL: Normal 11. Medial meniscus:Horizontal tear of the posterior horn affecting almost the entire width of the posterior horn  12. Medial compartment cartilage:Grade 1 degenerative changes to the tibial plateau and areas of Grade 2 changes to the medial femoral condyle 13. Lateral meniscus: Normal 14. Lateral compartment cartilage:Grade 1 degenerative changes to the tibial plateau, normal lateral femoral condyle   OPERATIVE REPORT:     I identified Jesse Alvarado in the pre-operative holding area. I marked the operative knee with my initials. I reviewed the risks and benefits of the proposed surgical intervention and the patient wished to proceed. The patient was transferred to the operative suite and placed in the supine position with all bony prominences padded.  Anesthesia was administered. Appropriate IV antibiotics were administered prior to incision. The extremity was then prepped and draped in standard fashion. A time out was performed confirming the correct extremity, correct patient, and correct procedure.   Arthroscopy portals were marked. Local anesthetic was injected to the planned portal sites. The anterolateral portal was established with an 11 blade.      The arthroscope was placed in the anterolateral portal and then into the suprapatellar pouch. Next, the medial portal was established under needle localization. A diagnostic knee scope was completed with the above findings. The medial meniscus tear was identified.  There was a small area of the posterior horn near the posterior horn/body junction with a clear horizontal split.  The suture from this area had migrated and was located anterior to the ACL with mild scar tissue around.   The MCL was pie-crusted to improve visualization of the posterior horn.  Upon  probing, I was able to extend the horizontal split to the region of the meniscus root laterally and the posterior horn/body junction  medially.  This was able to be performed without significant difficulty.  Prior repair stitches were removed.  The meniscal tear was debrided using an arthroscopic biter and an oscillating shaver until the meniscus had stable borders.  This involved resection of approximately 80% of the posterior horn of the medial meniscus. Arthroscopic fluid was removed from the joint.   The portals were closed with 3-0 Nylon suture. Sterile dressings included Xeroform, 4x4s, Sof-Rol, and Bias wrap. A Polarcare was placed.  The patient was then awakened and taken to the PACU hemodynamically stable without complication.     POSTOPERATIVE PLAN: The patient will be discharged home today once they meet PACU criteria. Aspirin 325 mg daily was prescribed for 2 weeks for DVT prophylaxis.  Physical therapy will start on POD#3-4. Weight-bearing as tolerated. Follow up in 2 weeks per protocol.

## 2020-10-11 NOTE — Anesthesia Preprocedure Evaluation (Addendum)
Anesthesia Evaluation  Patient identified by MRN, date of birth, ID band Patient awake    Reviewed: Allergy & Precautions, NPO status   Airway Mallampati: II  TM Distance: >3 FB     Dental   Pulmonary asthma (exercise induced) , sleep apnea and Continuous Positive Airway Pressure Ventilation ,    breath sounds clear to auscultation       Cardiovascular hypertension,  Rhythm:Regular Rate:Normal  HLD   Neuro/Psych    GI/Hepatic GERD  ,Fatty liver   Endo/Other  diabetes  Renal/GU      Musculoskeletal  (+) Arthritis ,   Abdominal   Peds  Hematology  (+) anemia ,   Anesthesia Other Findings   Reproductive/Obstetrics                            Anesthesia Physical Anesthesia Plan  ASA: III  Anesthesia Plan: General   Post-op Pain Management:    Induction: Intravenous  PONV Risk Score and Plan: Treatment may vary due to age or medical condition  Airway Management Planned: LMA  Additional Equipment:   Intra-op Plan:   Post-operative Plan:   Informed Consent: I have reviewed the patients History and Physical, chart, labs and discussed the procedure including the risks, benefits and alternatives for the proposed anesthesia with the patient or authorized representative who has indicated his/her understanding and acceptance.     Dental advisory given  Plan Discussed with: CRNA  Anesthesia Plan Comments:         Anesthesia Quick Evaluation

## 2020-10-11 NOTE — Discharge Instructions (Signed)
Arthroscopic Knee Surgery - Partial Meniscectomy   Post-Op Instructions   1. Bracing or crutches: Crutches will be provided at the time of discharge from the surgery center if you do not already have them.   2. Ice: You may be provided with a device (Polar Care) that allows you to ice the affected area effectively. Otherwise you can ice manually.    3. Driving:  Plan on not driving for at least two weeks. Please note that you are advised NOT to drive while taking narcotic pain medications as you may be impaired and unsafe to drive.   4. Activity: Ankle pumps several times an hour while awake to prevent blood clots. Weight bearing: as tolerated. Use crutches for as needed (usually ~1 week or less) until pain allows you to ambulate without a limp. Bending and straightening the knee is unlimited. Elevate knee above heart level as much as possible for one week. Avoid standing more than 5 minutes (consecutively) for the first week.  Avoid long distance travel for 2 weeks.  5. Medications:  - You have been provided a prescription for narcotic pain medicine. After surgery, take 1-2 narcotic tablets every 4 hours if needed for severe pain.  - You may take up to 3000mg/day of tylenol (acetaminophen). You can take 1000mg 3x/day. Please check your narcotic. If you have acetaminophen in your narcotic (each tablet will be 325mg), be careful not to exceed a total of 3000mg/day of acetaminophen.  - A prescription for anti-nausea medication will be provided in case the narcotic medicine or anesthesia causes nausea - take 1 tablet every 6 hours only if nauseated.  - Take ibuprofen 800 mg every 8 hours WITH food to reduce post-operative knee swelling. DO NOT STOP IBUPROFEN POST-OP UNTIL INSTRUCTED TO DO SO at first post-op office visit (10-14 days after surgery). However, please discontinue if you have any abdominal discomfort after taking this.  - Take enteric coated aspirin 325 mg once daily for 2 weeks to prevent  blood clots.    6. Bandages: The physical therapist should change the bandages at the first post-op appointment. If needed, the dressing supplies have been provided to you.   7. Physical Therapy: 1-2 times per week for 6 weeks. Therapy typically starts on post operative Day 3 or 4. You have been provided an order for physical therapy. The therapist will provide home exercises.   8. Work: May return to full work usually around 2 weeks after 1st post-operative visit. May do light duty/desk job in approximately 1-2 weeks when off of narcotics, pain is well-controlled, and swelling has decreased. Labor intensive jobs may require 4-6 weeks to return.      9. Post-Op Appointments: Your first post-op appointment will be with Dr. Dontavious Emily in approximately 2 weeks time.    If you find that they have not been scheduled please call the Orthopaedic Appointment front desk at [phone].     General Anesthesia, Adult, Care After This sheet gives you information about how to care for yourself after your procedure. Your health care provider may also give you more specific instructions. If you have problems or questions, contact your health care provider. What can I expect after the procedure? After the procedure, the following side effects are common:  Pain or discomfort at the IV site.  Nausea.  Vomiting.  Sore throat.  Trouble concentrating.  Feeling cold or chills.  Weak or tired.  Sleepiness and fatigue.  Soreness and body aches. These side effects can affect parts   of the body that were not involved in surgery. Follow these instructions at home:  For at least 24 hours after the procedure:  Have a responsible adult stay with you. It is important to have someone help care for you until you are awake and alert.  Rest as needed.  Do not: ? Participate in activities in which you could fall or become injured. ? Drive. ? Use heavy machinery. ? Drink alcohol. ? Take sleeping pills or  medicines that cause drowsiness. ? Make important decisions or sign legal documents. ? Take care of children on your own. Eating and drinking  Follow any instructions from your health care provider about eating or drinking restrictions.  When you feel hungry, start by eating small amounts of foods that are soft and easy to digest (bland), such as toast. Gradually return to your regular diet.  Drink enough fluid to keep your urine pale yellow.  If you vomit, rehydrate by drinking water, juice, or clear broth. General instructions  If you have sleep apnea, surgery and certain medicines can increase your risk for breathing problems. Follow instructions from your health care provider about wearing your sleep device: ? Anytime you are sleeping, including during daytime naps. ? While taking prescription pain medicines, sleeping medicines, or medicines that make you drowsy.  Return to your normal activities as told by your health care provider. Ask your health care provider what activities are safe for you.  Take over-the-counter and prescription medicines only as told by your health care provider.  If you smoke, do not smoke without supervision.  Keep all follow-up visits as told by your health care provider. This is important. Contact a health care provider if:  You have nausea or vomiting that does not get better with medicine.  You cannot eat or drink without vomiting.  You have pain that does not get better with medicine.  You are unable to pass urine.  You develop a skin rash.  You have a fever.  You have redness around your IV site that gets worse. Get help right away if:  You have difficulty breathing.  You have chest pain.  You have blood in your urine or stool, or you vomit blood. Summary  After the procedure, it is common to have a sore throat or nausea. It is also common to feel tired.  Have a responsible adult stay with you for the first 24 hours after general  anesthesia. It is important to have someone help care for you until you are awake and alert.  When you feel hungry, start by eating small amounts of foods that are soft and easy to digest (bland), such as toast. Gradually return to your regular diet.  Drink enough fluid to keep your urine pale yellow.  Return to your normal activities as told by your health care provider. Ask your health care provider what activities are safe for you. This information is not intended to replace advice given to you by your health care provider. Make sure you discuss any questions you have with your health care provider. Document Revised: 10/29/2017 Document Reviewed: 06/11/2017 Elsevier Patient Education  2020 [employer]  

## 2020-10-14 ENCOUNTER — Other Ambulatory Visit: Payer: Self-pay | Admitting: Internal Medicine

## 2020-10-14 ENCOUNTER — Encounter: Payer: Self-pay | Admitting: Orthopedic Surgery

## 2020-10-14 DIAGNOSIS — E118 Type 2 diabetes mellitus with unspecified complications: Secondary | ICD-10-CM

## 2020-10-15 LAB — HM DIABETES EYE EXAM

## 2020-10-17 ENCOUNTER — Encounter: Payer: Self-pay | Admitting: Internal Medicine

## 2020-10-20 ENCOUNTER — Other Ambulatory Visit: Payer: Self-pay | Admitting: Internal Medicine

## 2020-10-20 DIAGNOSIS — E781 Pure hyperglyceridemia: Secondary | ICD-10-CM

## 2021-01-29 ENCOUNTER — Other Ambulatory Visit (HOSPITAL_COMMUNITY): Payer: Self-pay | Admitting: Orthopedic Surgery

## 2021-01-29 ENCOUNTER — Other Ambulatory Visit: Payer: Self-pay | Admitting: Orthopedic Surgery

## 2021-01-29 DIAGNOSIS — S83241D Other tear of medial meniscus, current injury, right knee, subsequent encounter: Secondary | ICD-10-CM

## 2021-02-09 ENCOUNTER — Ambulatory Visit
Admission: RE | Admit: 2021-02-09 | Discharge: 2021-02-09 | Disposition: A | Discharge status: Home / Self Care | Payer: Managed Care, Other (non HMO) | Source: Ambulatory Visit | Attending: Orthopedic Surgery | Admitting: Orthopedic Surgery

## 2021-02-09 ENCOUNTER — Other Ambulatory Visit: Payer: Self-pay

## 2021-02-09 DIAGNOSIS — S83241D Other tear of medial meniscus, current injury, right knee, subsequent encounter: Secondary | ICD-10-CM | POA: Insufficient documentation

## 2021-04-09 ENCOUNTER — Encounter: Payer: Managed Care, Other (non HMO) | Admitting: Internal Medicine

## 2021-04-15 ENCOUNTER — Telehealth: Payer: Self-pay | Admitting: Internal Medicine

## 2021-04-15 NOTE — Telephone Encounter (Signed)
New Message:     Pt is having an appt on 07-09-21 with Dr Buren Kos for his Lipid. Pt will need an order to have lab work before his visit please.

## 2021-04-15 NOTE — Telephone Encounter (Signed)
Spoke with patient - advised labs done a few days ago will suffice, no need for additional labs. Moved appt sooner to 05/13/21. Patient concerned a TGs had increased.

## 2021-05-06 ENCOUNTER — Encounter: Payer: Self-pay | Admitting: Cardiology

## 2021-05-06 ENCOUNTER — Other Ambulatory Visit: Payer: Self-pay

## 2021-05-06 ENCOUNTER — Ambulatory Visit (INDEPENDENT_AMBULATORY_CARE_PROVIDER_SITE_OTHER): Payer: Managed Care, Other (non HMO) | Admitting: Cardiology

## 2021-05-06 VITALS — BP 124/76 | HR 76 | Ht 70.0 in | Wt 186.0 lb

## 2021-05-06 DIAGNOSIS — R931 Abnormal findings on diagnostic imaging of heart and coronary circulation: Secondary | ICD-10-CM

## 2021-05-06 NOTE — Patient Instructions (Signed)
  Testing/Procedures:  Your physician has requested that you have an exercise tolerance test. For further information please visit [email or web address]. Please also follow instruction sheet, as given. [facility]   Follow-Up: At [facility], you and your health needs are our priority.  As part of our continuing mission to provide you with exceptional heart care, we have created designated Provider Care Teams.  These Care Teams include your primary Cardiologist (physician) and Advanced Practice Providers (APPs -  Physician Assistants and Nurse Practitioners) who all work together to provide you with the care you need, when you need it.  We recommend signing up for the patient portal called "MyChart".  Sign up information is provided on this After Visit Summary.  MyChart is used to connect with patients for Virtual Visits (Telemedicine).  Patients are able to view lab/test results, encounter notes, upcoming appointments, etc.  Non-urgent messages can be sent to your provider as well.   To learn more about what you can do with MyChart, go to [email or web address].    Your next appointment:    AS NEEDED

## 2021-05-06 NOTE — Progress Notes (Signed)
Cardiology Office Note   Date:  05/06/2021   ID:  ROMEN YUTZY, DOB 1966-07-10, MRN [id number]  PCP:  Gladstone Lighter, MD  Cardiologist:   None Referring:  Gladstone Lighter, MD  Chief Complaint  Patient presents with   Palpitations       History of Present Illness: Jesse Alvarado is a 55 y.o. male who presents for evaluation of coronary calcium.  This was found on a CT. I saw him for evaluation of shortness of breath and cough.  He had a negative perfusion study ultimately had a negative cardiopulmonary stress test.    He presents for follow-up with episodes of his heart pounding in his chest.  Once was in April and once was about a month later.  Does seem to happen with activity and warm weather.   He did not feel his heart racing but he was just pounding hard and may be a little fast.  He did not have any presyncope or syncope.  He did not have chest pressure, neck or arm discomfort.  He is able to do such activities such as pushing a lawnmower maybe for 40 minutes which he did a couple of weeks ago without bringing on any symptoms.  He is not describing chest pressure, neck or arm discomfort.  He is not having weight gain or edema.     Past Medical History:  Diagnosis Date   Anemia    taking 3 iron pills a day   Anemia    Arthritis    Asthma    sports induced asthma. takes inhalers when needed   Barrett's esophagus without dysplasia    Cancer ([place])    Basal cell   Chronic kidney disease    kidney stones   Complication of anesthesia    high tolerance to pain medication. body absorbs pain med quickly   Cough on exercise    Diabetes mellitus without complication ([place])    Diverticulosis    Erectile dysfunction    Fatty liver    GERD (gastroesophageal reflux disease)    History of kidney stones    Hyperlipidemia    Hypertension    per patient, he does not have high bp but is treated for his kidneys and his diabetes   Kidney stone    Pancreatitis     Right shoulder injury    Sleep apnea    use C-PAP   Wears hearing aid in both ears     Past Surgical History:  Procedure Laterality Date   CARPAL TUNNEL RELEASE Right 10/03/2018   Procedure: CARPAL TUNNEL RELEASE;  Surgeon: Earnestine Leys, MD;  Location: [facility];  Service: Orthopedics;  Laterality: Right;   CARPAL TUNNEL RELEASE Left 10/24/2018   Procedure: CARPAL TUNNEL RELEASE;  Surgeon: Earnestine Leys, MD;  Location: [facility];  Service: Orthopedics;  Laterality: Left;   COLONOSCOPY     COLONOSCOPY WITH PROPOFOL N/A 08/30/2019   Procedure: COLONOSCOPY WITH PROPOFOL;  Surgeon: Toledo, Benay Pike, MD;  Location: [facility];  Service: Gastroenterology;  Laterality: N/A;   CYSTOSCOPY WITH STENT PLACEMENT Bilateral 06/05/2016   Procedure: CYSTOSCOPY WITH STENT PLACEMENT;  Surgeon: Nickie Retort, MD;  Location: [facility];  Service: Urology;  Laterality: Bilateral;   CYSTOSCOPY/URETEROSCOPY/HOLMIUM LASER/STENT PLACEMENT Left 04/13/2016   Procedure: CYSTOSCOPY/RETROGRADE PYELOGRAM/URETEROSCOPY WITH HOLMIUM LASER LITHOTRIPSY//STENT PLACEMENT;  Surgeon: Festus Aloe, MD;  Location: [facility];  Service: Urology;  Laterality: Left;   ESOPHAGOGASTRODUODENOSCOPY (EGD) WITH PROPOFOL N/A 12/27/2015   Procedure: ESOPHAGOGASTRODUODENOSCOPY (EGD) WITH  PROPOFOL;  Surgeon: Manya Silvas, MD;  Location: [facility];  Service: Endoscopy;  Laterality: N/A;   ESOPHAGOGASTRODUODENOSCOPY (EGD) WITH PROPOFOL N/A 08/30/2019   Procedure: ESOPHAGOGASTRODUODENOSCOPY (EGD) WITH PROPOFOL;  Surgeon: Toledo, Benay Pike, MD;  Location: [facility];  Service: Gastroenterology;  Laterality: N/A;   EXTRACORPOREAL SHOCK WAVE LITHOTRIPSY Right 12/30/2017   Procedure: EXTRACORPOREAL SHOCK WAVE LITHOTRIPSY (ESWL);  Surgeon: Royston Cowper, MD;  Location: [facility];  Service: Urology;  Laterality: Right;   HERNIA REPAIR  [id number]   umbilical   KNEE ARTHROSCOPY Right 2015   had bursa sack repaired   KNEE ARTHROSCOPY  WITH MEDIAL MENISECTOMY Right 10/11/2020   Procedure: Right knee arthroscopy partial medial meniscectomy;  Surgeon: Leim Fabry, MD;  Location: [place];  Service: Orthopedics;  Laterality: Right;  Diabetic - oral meds sleep apnea   KNEE ARTHROSCOPY WITH MENISCAL REPAIR Right 11/18/2018   Procedure: KNEE ARTHROSCOPY WITH MENISCAL REPAIR AND CHONDROPLASTY;  Surgeon: Leim Fabry, MD;  Location: [place];  Service: Orthopedics;  Laterality: Right;  Diabetic - oral meds sleep apnea SUPINE WITH ACL LEG HOLDER SMITH AND NEWPHEW CETERIX   PREPATELLAR BURSA EXCISION Left 1993   and ostetomy. had at least 5 surgeries in 15 years   SHOULDER SURGERY Right 2011   tendon was shredded and was trimmed, repaired and reattached. Screws in shoulder   URETEROSCOPY WITH HOLMIUM LASER LITHOTRIPSY Bilateral 06/05/2016   Procedure: URETEROSCOPY WITH HOLMIUM LASER LITHOTRIPSY;  Surgeon: Nickie Retort, MD;  Location: [facility];  Service: Urology;  Laterality: Bilateral;   VASECTOMY  2002     Current Outpatient Medications  Medication Sig Dispense Refill   acetaminophen (TYLENOL) 500 MG tablet Take 2 tablets (1,000 mg total) by mouth every 8 (eight) hours. 90 tablet 2   amitriptyline (ELAVIL) 25 MG tablet Take 25 mg by mouth at bedtime. Recurring Cough- Dr Joya Gaskins-  11   atorvastatin (LIPITOR) 80 MG tablet TAKE 1 TABLET(80 MG) BY MOUTH DAILY 90 tablet 3   atorvastatin (LIPITOR) 80 MG tablet Take by mouth.     azelastine (OPTIVAR) 0.05 % ophthalmic solution 1 drop into affected eye     Azelastine-Fluticasone (DYMISTA) 137-50 MCG/ACT SUSP Place 1 spray into both nostrils 2 (two) times daily.      benzonatate (TESSALON) 200 MG capsule Take by mouth.     calcium citrate (CALCITRATE - DOSED IN MG ELEMENTAL CALCIUM) 950 MG tablet Take 200 mg of elemental calcium by mouth 3 (three) times daily. TUMS     calcium citrate-vitamin D (CITRACAL+D) 315-200 MG-UNIT tablet Take by mouth.     ciclopirox  (PENLAC) 8 % solution Apply 1 application topically at bedtime. Apply over nail and surrounding skin. Apply daily over previous coat. After seven (7) days, may remove with alcohol and continue cycle. 6.6 mL 5   Continuous Blood Gluc Sensor (FREESTYLE LIBRE 2 SENSOR) MISC USE 1 KIT EVERY 14 DAYS FOR GLUCOSE MONITORING     cyanocobalamin (,VITAMIN B-12,) 1000 MCG/ML injection ADMINISTER 1 ML(1000 MCG) IN THE MUSCLE EVERY 30 DAYS 1 mL 11   dapagliflozin propanediol (FARXIGA) 10 MG TABS tablet Take 10 mg by mouth daily.     ezetimibe (ZETIA) 10 MG tablet TAKE 1 TABLET(10 MG) BY MOUTH DAILY 90 tablet 3   fenofibrate (TRICOR) 145 MG tablet Take 1 tablet (145 mg total) by mouth daily. 90 tablet 3   ferrous gluconate (FERGON) 324 MG tablet TAKE 1 TABLET(324 MG) BY MOUTH THREE TIMES DAILY WITH MEALS 270 tablet  1   ferrous gluconate (FERGON) 324 MG tablet Take by mouth.     folic acid (FOLVITE) 1 MG tablet TAKE 1 TABLET(1 MG) BY MOUTH DAILY 90 tablet 0   gabapentin (NEURONTIN) 300 MG capsule Take 300 mg by mouth 3 (three) times daily. For chronic cough/laryngeal inflammation     glimepiride (AMARYL) 2 MG tablet Take 4 tablets (8 mg total) by mouth See admin instructions. Take 663m with breakfast, 218mwith lunch, and 63m50mith supper. (Patient taking differently: Take 2 mg by mouth 2 (two) times daily. One in the morning and one in the evening) 360 tablet 1   glucose blood (PRECISION QID TEST) test strip Use once daily Use as instructed.     HYDROcodone-acetaminophen (NORCO) 5-325 MG tablet Take 1-2 tablets by mouth every 4 (four) hours as needed for moderate pain or severe pain. 10 tablet 0   icosapent Ethyl (VASCEPA) 1 g capsule TAKE 2 CAPSULES(2 GRAMS) BY MOUTH TWICE DAILY 120 capsule 8   ipratropium (ATROVENT) 0.06 % nasal spray Place 2 sprays into both nostrils 2 (two) times daily.   3   ipratropium (ATROVENT) 0.06 % nasal spray 2 sprays in each nostril     ketoconazole (NIZORAL) 2 % shampoo WASH SCALP EVERY  OTHER WASHING ALTERNATING WITH HEAD AND SHOULDERS. LET SIT SEVERAL MINUTES BEFORE RINSING.     levocetirizine (XYZAL) 5 MG tablet Take 5 mg by mouth every evening.   11   losartan (COZAAR) 50 MG tablet Take 1 tablet by mouth daily.     meloxicam (MOBIC) 15 MG tablet Take by mouth.     metFORMIN (GLUCOPHAGE) 850 MG tablet TAKE 1 TABLET(850 MG) BY MOUTH THREE TIMES DAILY WITH MEALS 270 tablet 1   Multiple Vitamin (MULTI-VITAMINS) TABS Take 1 tablet by mouth 3 (three) times daily.      ondansetron (ZOFRAN ODT) 4 MG disintegrating tablet Take 1 tablet (4 mg total) by mouth every 8 (eight) hours as needed for nausea or vomiting. 20 tablet 0   pantoprazole (PROTONIX) 40 MG tablet Take 1 tablet (40 mg total) by mouth 2 (two) times daily. 180 tablet 1   pioglitazone (ACTOS) 30 MG tablet Take by mouth.     polyethylene glycol-electrolytes (NULYTELY/GOLYTELY) 420 g solution STOOL SOFT.     pravastatin (PRAVACHOL) 40 MG tablet Take by mouth.     PROAIR RESPICLICK 1085490 Base) MCG/ACT AEPB Inhale 2 puffs into the lungs every 6 (six) hours as needed (shortness of breath).   0   silodosin (RAPAFLO) 4 MG CAPS capsule Take 1 tablet by mouth daily.     sucralfate (CARAFATE) 1 g tablet TAKE 1 TABLET(1 GRAM) BY MOUTH THREE TIMES DAILY WITH MEALS 270 tablet 1   SYMBICORT 160-4.5 MCG/ACT inhaler Inhale 2 puffs into the lungs 2 (two) times daily.  3   tadalafil (CIALIS) 20 MG tablet Take by mouth.     tadalafil (CIALIS) 5 MG tablet Take 5 mg by mouth daily as needed for erectile dysfunction.     tiZANidine (ZANAFLEX) 2 MG tablet Take 2-4 mg by mouth at bedtime as needed for muscle spasms.      vitamin C (ASCORBIC ACID) 500 MG tablet Take 500 mg by mouth daily.     No current facility-administered medications for this visit.    Allergies:   Dulaglutide, Monosodium glutamate, and Ciprofloxacin    ROS:  Please see the history of present illness.   Otherwise, review of systems are positive for none.  All other  systems are reviewed and negative.    PHYSICAL EXAM: VS:  BP 124/76   Pulse 76   Ht 5' 10"  (1.778 m)   Wt 186 lb (84.4 kg)   SpO2 95%   BMI 26.69 kg/m  , BMI Body mass index is 26.69 kg/m. GENERAL:  Well appearing NECK:  No jugular venous distention, waveform within normal limits, carotid upstroke brisk and symmetric, no bruits, no thyromegaly LUNGS:  Clear to auscultation bilaterally CHEST:  Unremarkable HEART:  PMI not displaced or sustained,S1 and S2 within normal limits, no S3, no S4, no clicks, no rubs, no murmurs ABD:  Flat, positive bowel sounds normal in frequency in pitch, no bruits, no rebound, no guarding, no midline pulsatile mass, no hepatomegaly, no splenomegaly EXT:  2 plus pulses throughout, no edema, no cyanosis no clubbing    EKG:  EKG is ordered today. The ekg ordered today demonstrates sinus rhythm, rate 7776 axis within normal limits, intervals within normal limits, no acute ST-T wave changes.   Recent Labs: 08/16/2020: Hemoglobin 13.0    Lipid Panel    Component Value Date/Time   CHOL 134 06/27/2020 0816   TRIG 213 (H) 06/27/2020 0816   HDL 29 (L) 06/27/2020 0816   CHOLHDL 4.6 06/27/2020 0816   CHOLHDL 5.9 07/30/2018 0513   VLDL 63 (H) 07/30/2018 0513   LDLCALC 70 06/27/2020 0816      Wt Readings from Last 3 Encounters:  05/06/21 186 lb (84.4 kg)  10/11/20 182 lb (82.6 kg)  06/27/20 184 lb 6.4 oz (83.6 kg)      Other studies Reviewed: Additional studies/ records that were reviewed today include: Labs Review of the above records demonstrates:  Please see elsewhere in the note.     ASSESSMENT AND PLAN:  CORONARY CALCIUM:   Given this and his risk factors along with some symptoms associated with exertion I like to bring him back for a screening POET (Plain Old Exercise Treadmill)  DYSLIPIDEMIA:   LDL most recently was 52 with an HDL of 34.4.  Triglycerides were mildly elevated.  He has seen Dr. Debara Pickett and does see an endocrinologist.  At  this point I would not change his medications.  DM: Again he sees an endocrinologist.  I see that his A1c was 9.3 recently but he said that there was another study done by his endocrinologist which demonstrated good sugar control.  I will defer to his management.  PALPITATIONS: These were triggered by heat and he is no longer having these.  At this point I do not think monitoring would be helpful but he will let me know if he has any increased symptoms.  Current medicines are reviewed at length with the patient today.  The patient does not have concerns regarding medicines.  The following changes have been made:  None  Labs/ tests ordered today include:   Orders Placed This Encounter  Procedures   Cardiac Stress Test: Informed Consent Details: Physician/Practitioner Attestation; Transcribe to consent form and obtain patient signature   Exercise Tolerance Test   EKG 12-Lead      Disposition:   FU with me as needed based on the results of the above.   Signed, Minus Breeding, MD  05/06/2021 4:49 PM    [facility]

## 2021-05-13 ENCOUNTER — Other Ambulatory Visit: Payer: Self-pay

## 2021-05-13 ENCOUNTER — Ambulatory Visit (INDEPENDENT_AMBULATORY_CARE_PROVIDER_SITE_OTHER): Payer: Managed Care, Other (non HMO) | Admitting: Internal Medicine

## 2021-05-13 ENCOUNTER — Encounter (HOSPITAL_BASED_OUTPATIENT_CLINIC_OR_DEPARTMENT_OTHER): Payer: Self-pay | Admitting: Internal Medicine

## 2021-05-13 VITALS — BP 102/84 | HR 86 | Ht 70.0 in | Wt 186.6 lb

## 2021-05-13 DIAGNOSIS — I10 Essential (primary) hypertension: Secondary | ICD-10-CM | POA: Diagnosis not present

## 2021-05-13 DIAGNOSIS — R931 Abnormal findings on diagnostic imaging of heart and coronary circulation: Secondary | ICD-10-CM

## 2021-05-13 DIAGNOSIS — E785 Hyperlipidemia, unspecified: Secondary | ICD-10-CM

## 2021-05-13 DIAGNOSIS — E1100 Type 2 diabetes mellitus with hyperosmolarity without nonketotic hyperglycemic-hyperosmolar coma (NKHHC): Secondary | ICD-10-CM

## 2021-05-13 NOTE — Patient Instructions (Signed)
Medication Instructions:  CONTINUE these meds for cholesterol:  Atorvastatin 80mg  daily Ezetimibe 10mg  daily Vascepa 2gram twice daily Fenofibrate 145mg  daily  CONTINUE all other current medications  *If you need a refill on your cardiac medications before your next appointment, please call your pharmacy*   Lab Work: FASTING lab work in 1 year -- complete about 1 week before your next lipid clinic appointment  If you have labs (blood work) drawn today and your tests are completely normal, you will receive your results only by: [place] (if you have MyChart) OR A paper copy in the mail If you have any lab test that is abnormal or we need to change your treatment, we will call you to review the results.   Testing/Procedures: NONE   Follow-Up: At [facility], you and your health needs are our priority.  As part of our continuing mission to provide you with exceptional heart care, we have created designated Provider Care Teams.  These Care Teams include your primary Cardiologist (physician) and Advanced Practice Providers (APPs -  Physician Assistants and Nurse Practitioners) who all work together to provide you with the care you need, when you need it.  We recommend signing up for the patient portal called "MyChart".  Sign up information is provided on this After Visit Summary.  MyChart is used to connect with patients for Virtual Visits (Telemedicine).  Patients are able to view lab/test results, encounter notes, upcoming appointments, etc.  Non-urgent messages can be sent to your provider as well.   To learn more about what you can do with MyChart, go to [email or web address].    Your next appointment:   12 month(s) - lipid clinic  The format for your next appointment:   In Person  Provider:   K. Mali Hilty, MD   Other Instructions

## 2021-05-13 NOTE — Progress Notes (Signed)
LIPID CLINIC CONSULT NOTE  Chief Complaint:  Follow-up dyslipidemia  Primary Care Physician: Gladstone Lighter, MD  Primary Cardiologist:  None  HPI:  Jesse Alvarado is a 55 y.o. male who is being seen today for the evaluation of manage dyslipidemia at the request of Gladstone Lighter, MD.  This is a pleasant 55 year old male was kindly referred for evaluation and management of dyslipidemia.  He is a patient that is followed by my partner Dr. Percival Spanish and recently was noted to have a significantly elevated cholesterol profile.  His total cholesterol was 242 with triglycerides of 267, HDL 37 and LDL of 151.  He currently takes pravastatin 40 mg daily and fenofibrate 145 mg daily. Other medical problems include type 2 diabetes (non-insulin-dependent), chronic kidney disease, GERD, hypertension, OSA on CPAP and nephrolithiasis.  He was noted to have coronary artery calcification on his CT scan and was therefore referred to Dr. Percival Spanish for further evaluation and management.  LDL in October was 118 however most recently LDL was elevated further at 151.  He does report compliance with his medication.  07/24/2019  [name] returns today for follow-up dyslipidemia.  Overall he reports feeling well on medications.  He has had significant improvement in his numbers.  Total cholesterol now 158, triglycerides 96 (decreased from 314) and HDL 38, LDL 101.  Although we have targeted LDL less than 70, he has had significant improvement particularly in triglycerides on Vascepa.  He is also made significant dietary changes as well.  10/24/2019  [name] is seen today in follow-up.  Overall he has improved his cholesterol profile further.  His most recent showed total cholesterol 134, triglycerides 78, HDL 39 and LDL 81 (improved from 101).  Diet has played a big role in this.  He continues compliance with atorvastatin and Vascepa.  His target LDL in my mind would be less than 70 given diabetes and  known coronary disease.  We had previously discussed adding ezetimibe and he seems amenable to it.  06/27/2020  [name] returns for follow-up.  Overall he is doing well although is likely fatigued as he is working about 5060 hours a week.  He expects this to go on for about another 2 years due to his position.  His lipids have been improved 4 months ago with total cholesterol 117, triglycerides 121, HDL 34 and LDL 59.  After adding ezetimibe.  He had repeat lipids drawn this morning which are not yet available.  05/13/2021  [name] is seen today in follow-up.  He recently saw Dr. Percival Spanish.  He is actually been ordered to have a treadmill stress test.  With regards to his cholesterol overall seems to be well controlled.  He just had repeat lipids about a month ago from his primary care provider.  This showed total cholesterol 130, triglycerides 216, HDL 34 and LDL 52.  This appears to be very similar to his labs from 2021.  After reviewing his list of meds however it seems that he is actually taking both atorvastatin and pravastatin.  This was apparently recently started.  He is also on Vascepa, fenofibrate and ezetimibe.  I advised him to discontinue the Pravachol.  PMHx:  Past Medical History:  Diagnosis Date   Anemia    taking 3 iron pills a day   Anemia    Arthritis    Asthma    sports induced asthma. takes inhalers when needed   Barrett's esophagus without dysplasia    Cancer ([place])  Basal cell   Chronic kidney disease    kidney stones   Complication of anesthesia    high tolerance to pain medication. body absorbs pain med quickly   Cough on exercise    Diabetes mellitus without complication ([place])    Diverticulosis    Erectile dysfunction    Fatty liver    GERD (gastroesophageal reflux disease)    History of kidney stones    Hyperlipidemia    Hypertension    per patient, he does not have high bp but is treated for his kidneys and his diabetes   Kidney stone     Pancreatitis    Right shoulder injury    Sleep apnea    use C-PAP   Wears hearing aid in both ears     Past Surgical History:  Procedure Laterality Date   CARPAL TUNNEL RELEASE Right 10/03/2018   Procedure: CARPAL TUNNEL RELEASE;  Surgeon: Earnestine Leys, MD;  Location: [facility];  Service: Orthopedics;  Laterality: Right;   CARPAL TUNNEL RELEASE Left 10/24/2018   Procedure: CARPAL TUNNEL RELEASE;  Surgeon: Earnestine Leys, MD;  Location: [facility];  Service: Orthopedics;  Laterality: Left;   COLONOSCOPY     COLONOSCOPY WITH PROPOFOL N/A 08/30/2019   Procedure: COLONOSCOPY WITH PROPOFOL;  Surgeon: Toledo, Benay Pike, MD;  Location: [facility];  Service: Gastroenterology;  Laterality: N/A;   CYSTOSCOPY WITH STENT PLACEMENT Bilateral 06/05/2016   Procedure: CYSTOSCOPY WITH STENT PLACEMENT;  Surgeon: Nickie Retort, MD;  Location: [facility];  Service: Urology;  Laterality: Bilateral;   CYSTOSCOPY/URETEROSCOPY/HOLMIUM LASER/STENT PLACEMENT Left 04/13/2016   Procedure: CYSTOSCOPY/RETROGRADE PYELOGRAM/URETEROSCOPY WITH HOLMIUM LASER LITHOTRIPSY//STENT PLACEMENT;  Surgeon: Festus Aloe, MD;  Location: [facility];  Service: Urology;  Laterality: Left;   ESOPHAGOGASTRODUODENOSCOPY (EGD) WITH PROPOFOL N/A 12/27/2015   Procedure: ESOPHAGOGASTRODUODENOSCOPY (EGD) WITH PROPOFOL;  Surgeon: Manya Silvas, MD;  Location: [facility];  Service: Endoscopy;  Laterality: N/A;   ESOPHAGOGASTRODUODENOSCOPY (EGD) WITH PROPOFOL N/A 08/30/2019   Procedure: ESOPHAGOGASTRODUODENOSCOPY (EGD) WITH PROPOFOL;  Surgeon: Toledo, Benay Pike, MD;  Location: [facility];  Service: Gastroenterology;  Laterality: N/A;   EXTRACORPOREAL SHOCK WAVE LITHOTRIPSY Right 12/30/2017   Procedure: EXTRACORPOREAL SHOCK WAVE LITHOTRIPSY (ESWL);  Surgeon: Royston Cowper, MD;  Location: [facility];  Service: Urology;  Laterality: Right;   HERNIA REPAIR  [id number]   umbilical   KNEE ARTHROSCOPY Right 2015   had bursa sack repaired    KNEE ARTHROSCOPY WITH MEDIAL MENISECTOMY Right 10/11/2020   Procedure: Right knee arthroscopy partial medial meniscectomy;  Surgeon: Leim Fabry, MD;  Location: [place];  Service: Orthopedics;  Laterality: Right;  Diabetic - oral meds sleep apnea   KNEE ARTHROSCOPY WITH MENISCAL REPAIR Right 11/18/2018   Procedure: KNEE ARTHROSCOPY WITH MENISCAL REPAIR AND CHONDROPLASTY;  Surgeon: Leim Fabry, MD;  Location: [place];  Service: Orthopedics;  Laterality: Right;  Diabetic - oral meds sleep apnea SUPINE WITH ACL LEG HOLDER SMITH AND NEWPHEW CETERIX   PREPATELLAR BURSA EXCISION Left 1993   and ostetomy. had at least 5 surgeries in 15 years   SHOULDER SURGERY Right 2011   tendon was shredded and was trimmed, repaired and reattached. Screws in shoulder   URETEROSCOPY WITH HOLMIUM LASER LITHOTRIPSY Bilateral 06/05/2016   Procedure: URETEROSCOPY WITH HOLMIUM LASER LITHOTRIPSY;  Surgeon: Nickie Retort, MD;  Location: [facility];  Service: Urology;  Laterality: Bilateral;   VASECTOMY  2002    FAMHx:  Family History  Problem Relation Age of Onset   Urolithiasis Father  Kidney disease Father    Prostate cancer Neg Hx    Kidney cancer Neg Hx     SOCHx:   reports that he has never smoked. He has never used smokeless tobacco. He reports that he does not drink alcohol and does not use drugs.  ALLERGIES:  Allergies  Allergen Reactions   Dulaglutide Other (See Comments)    Trulicity- Caused pancreatitis    Monosodium Glutamate Diarrhea    MSG   Ciprofloxacin Other (See Comments)    GI upset    ROS: Pertinent items noted in HPI and remainder of comprehensive ROS otherwise negative.  HOME MEDS: Current Outpatient Medications on File Prior to Visit  Medication Sig Dispense Refill   acetaminophen (TYLENOL) 500 MG tablet Take 2 tablets (1,000 mg total) by mouth every 8 (eight) hours. 90 tablet 2   amitriptyline (ELAVIL) 25 MG tablet Take 25 mg by mouth at bedtime.  Recurring Cough- Dr Joya Gaskins-  11   atorvastatin (LIPITOR) 80 MG tablet TAKE 1 TABLET(80 MG) BY MOUTH DAILY 90 tablet 3   azelastine (OPTIVAR) 0.05 % ophthalmic solution 1 drop into affected eye     Azelastine-Fluticasone (DYMISTA) 137-50 MCG/ACT SUSP Place 1 spray into both nostrils 2 (two) times daily.      benzonatate (TESSALON) 200 MG capsule Take by mouth.     calcium citrate-vitamin D (CITRACAL+D) 315-200 MG-UNIT tablet Take by mouth.     ciclopirox (PENLAC) 8 % solution Apply 1 application topically at bedtime. Apply over nail and surrounding skin. Apply daily over previous coat. After seven (7) days, may remove with alcohol and continue cycle. 6.6 mL 5   Continuous Blood Gluc Sensor (FREESTYLE LIBRE 2 SENSOR) MISC USE 1 KIT EVERY 14 DAYS FOR GLUCOSE MONITORING     cyanocobalamin (,VITAMIN B-12,) 1000 MCG/ML injection ADMINISTER 1 ML(1000 MCG) IN THE MUSCLE EVERY 30 DAYS 1 mL 11   dapagliflozin propanediol (FARXIGA) 10 MG TABS tablet Take 10 mg by mouth daily.     ezetimibe (ZETIA) 10 MG tablet TAKE 1 TABLET(10 MG) BY MOUTH DAILY 90 tablet 3   fenofibrate (TRICOR) 145 MG tablet Take 1 tablet (145 mg total) by mouth daily. 90 tablet 3   ferrous gluconate (FERGON) 324 MG tablet Take 324 mg by mouth 3 (three) times daily with meals.     folic acid (FOLVITE) 1 MG tablet TAKE 1 TABLET(1 MG) BY MOUTH DAILY 90 tablet 0   gabapentin (NEURONTIN) 300 MG capsule Take 300 mg by mouth 3 (three) times daily. For chronic cough/laryngeal inflammation     glimepiride (AMARYL) 2 MG tablet Take 4 tablets (8 mg total) by mouth See admin instructions. Take 86m with breakfast, 217mwith lunch, and 82m28mith supper. (Patient taking differently: Take 2 mg by mouth 2 (two) times daily. One in the morning and one in the evening) 360 tablet 1   glucose blood (PRECISION QID TEST) test strip Use once daily Use as instructed.     HYDROcodone-acetaminophen (NORCO) 5-325 MG tablet Take 1-2 tablets by mouth every 4 (four) hours  as needed for moderate pain or severe pain. 10 tablet 0   icosapent Ethyl (VASCEPA) 1 g capsule TAKE 2 CAPSULES(2 GRAMS) BY MOUTH TWICE DAILY 120 capsule 8   ipratropium (ATROVENT) 0.06 % nasal spray Place 2 sprays into both nostrils 2 (two) times daily.   3   ketoconazole (NIZORAL) 2 % shampoo WASH SCALP EVERY OTHER WASHING ALTERNATING WITH HEAD AND SHOULDERS. LET SIT SEVERAL MINUTES BEFORE RINSING.  levocetirizine (XYZAL) 5 MG tablet Take 5 mg by mouth every evening.   11   losartan (COZAAR) 50 MG tablet Take 1 tablet by mouth daily.     meloxicam (MOBIC) 15 MG tablet Take by mouth.     metFORMIN (GLUCOPHAGE) 850 MG tablet TAKE 1 TABLET(850 MG) BY MOUTH THREE TIMES DAILY WITH MEALS 270 tablet 1   ondansetron (ZOFRAN ODT) 4 MG disintegrating tablet Take 1 tablet (4 mg total) by mouth every 8 (eight) hours as needed for nausea or vomiting. 20 tablet 0   pantoprazole (PROTONIX) 40 MG tablet Take 1 tablet (40 mg total) by mouth 2 (two) times daily. 180 tablet 1   pioglitazone (ACTOS) 30 MG tablet Take by mouth.     polyethylene glycol-electrolytes (NULYTELY/GOLYTELY) 420 g solution STOOL SOFT.     PROAIR RESPICLICK 789 (90 Base) MCG/ACT AEPB Inhale 2 puffs into the lungs every 6 (six) hours as needed (shortness of breath).   0   silodosin (RAPAFLO) 4 MG CAPS capsule Take 1 tablet by mouth daily.     sucralfate (CARAFATE) 1 g tablet TAKE 1 TABLET(1 GRAM) BY MOUTH THREE TIMES DAILY WITH MEALS 270 tablet 1   SYMBICORT 160-4.5 MCG/ACT inhaler Inhale 2 puffs into the lungs 2 (two) times daily.  3   tadalafil (CIALIS) 20 MG tablet Take by mouth.     tiZANidine (ZANAFLEX) 2 MG tablet Take 2-4 mg by mouth at bedtime as needed for muscle spasms.      vitamin C (ASCORBIC ACID) 500 MG tablet Take 500 mg by mouth daily.     No current facility-administered medications on file prior to visit.    LABS/IMAGING: No results found for this or any previous visit (from the past 48 hour(s)). No results  found.  LIPID PANEL:    Component Value Date/Time   CHOL 134 06/27/2020 0816   TRIG 213 (H) 06/27/2020 0816   HDL 29 (L) 06/27/2020 0816   CHOLHDL 4.6 06/27/2020 0816   CHOLHDL 5.9 07/30/2018 0513   VLDL 63 (H) 07/30/2018 0513   LDLCALC 70 06/27/2020 0816    WEIGHTS: Wt Readings from Last 3 Encounters:  05/13/21 186 lb 9.6 oz (84.6 kg)  05/06/21 186 lb (84.4 kg)  10/11/20 182 lb (82.6 kg)    VITALS: BP 102/84   Pulse 86   Ht _0  (1.778 m)   Wt 186 lb 9.6 oz (84.6 kg)   SpO2 95%   BMI 26.77 kg/m   EXAM: Deferred  EKG: Deferred  ASSESSMENT: Mixed dyslipidemia with high triglycerides High coronary artery calcium score Essential hypertension Type 2 diabetes (not on insulin) History of pancreatitis (2019)  PLAN: 1.   Mr. Alvarado continues to have well-controlled lipids with mildly elevated triglycerides however much improved from the past.  He has had no recurrent episodes of pancreatitis.  Its not clear how he was started on Pravachol in addition to atorvastatin however should not be on 2 different statins.  I advised him to discontinue the Pravachol and continue his ezetimibe, fenofibrate and Vascepa.  Happy to see him back annually or sooner as necessary.  Pixie Casino, MD, [facility], [place] Director of the [facility]  [facility] Diplomate of the American Board of Clinical Lipidology Attending Cardiologist  Direct Dial: [phone]  Fax: [phone]  Website:  [email or web address] Osgood Elvy Mclarty 05/13/2021, 3:23 PM

## 2021-05-16 ENCOUNTER — Telehealth (HOSPITAL_COMMUNITY): Payer: Self-pay

## 2021-05-16 NOTE — Telephone Encounter (Signed)
Close encounter 

## 2021-05-21 ENCOUNTER — Other Ambulatory Visit: Payer: Self-pay

## 2021-05-21 ENCOUNTER — Ambulatory Visit (HOSPITAL_COMMUNITY)
Admission: RE | Admit: 2021-05-21 | Discharge: 2021-05-21 | Disposition: A | Discharge status: Home / Self Care | Payer: Managed Care, Other (non HMO) | Source: Ambulatory Visit | Attending: Cardiovascular Disease | Admitting: Cardiovascular Disease

## 2021-05-21 DIAGNOSIS — R931 Abnormal findings on diagnostic imaging of heart and coronary circulation: Secondary | ICD-10-CM | POA: Diagnosis present

## 2021-05-21 LAB — EXERCISE TOLERANCE TEST
Estimated workload: 11 METS
Exercise duration (min): 9 min
Exercise duration (sec): 35 s
MPHR: 164 {beats}/min
Peak HR: 173 {beats}/min
Percent HR: 105 %
Rest HR: 76 {beats}/min

## 2021-07-09 ENCOUNTER — Ambulatory Visit (HOSPITAL_BASED_OUTPATIENT_CLINIC_OR_DEPARTMENT_OTHER): Payer: Managed Care, Other (non HMO) | Admitting: Internal Medicine

## 2021-07-29 ENCOUNTER — Other Ambulatory Visit (HOSPITAL_BASED_OUTPATIENT_CLINIC_OR_DEPARTMENT_OTHER): Payer: Self-pay | Admitting: Internal Medicine

## 2021-07-29 DIAGNOSIS — E781 Pure hyperglyceridemia: Secondary | ICD-10-CM

## 2021-08-24 DIAGNOSIS — E785 Hyperlipidemia, unspecified: Secondary | ICD-10-CM | POA: Insufficient documentation

## 2021-08-24 NOTE — Progress Notes (Signed)
Cardiology Office Note   Date:  08/26/2021   ID:  Jesse Alvarado, DOB Oct 18, 1966, MRN [id number]  PCP:  Gladstone Lighter, MD  Cardiologist:   None Referring:  Gladstone Lighter, MD  Chief Complaint  Patient presents with   Elevated Coronary Calcium        History of Present Illness: Jesse Alvarado is a 55 y.o. male who presents for evaluation of coronary calcium.  This was found on a CT. I saw him for evaluation of shortness of breath and cough.  He had a negative perfusion study ultimately had a negative cardiopulmonary stress test.    He presents for follow-up.  Since I last saw him he has done well.  He has been active and has no chest pain.  I reviewed with him at length his POET (Plain Old Exercise Treadmill).  He was able to walk over 9 minutes before developing some transient ST depression that resolved quickly.  He had no chest pain or other symptoms associated.  The patient denies any new symptoms such as chest discomfort, neck or arm discomfort. There has been no new shortness of breath, PND or orthopnea. There have been no reported palpitations, presyncope or syncope.    Past Medical History:  Diagnosis Date   Anemia    taking 3 iron pills a day   Anemia    Arthritis    Asthma    sports induced asthma. takes inhalers when needed   Barrett's esophagus without dysplasia    Cancer ([place])    Basal cell   Chronic kidney disease    kidney stones   Complication of anesthesia    high tolerance to pain medication. body absorbs pain med quickly   Cough on exercise    Diabetes mellitus without complication ([place])    Diverticulosis    Erectile dysfunction    Fatty liver    GERD (gastroesophageal reflux disease)    History of kidney stones    Hyperlipidemia    Hypertension    per patient, he does not have high bp but is treated for his kidneys and his diabetes   Kidney stone    Pancreatitis    Right shoulder injury    Sleep apnea    use C-PAP   Wears  hearing aid in both ears     Past Surgical History:  Procedure Laterality Date   CARPAL TUNNEL RELEASE Right 10/03/2018   Procedure: CARPAL TUNNEL RELEASE;  Surgeon: Earnestine Leys, MD;  Location: [facility];  Service: Orthopedics;  Laterality: Right;   CARPAL TUNNEL RELEASE Left 10/24/2018   Procedure: CARPAL TUNNEL RELEASE;  Surgeon: Earnestine Leys, MD;  Location: [facility];  Service: Orthopedics;  Laterality: Left;   COLONOSCOPY     COLONOSCOPY WITH PROPOFOL N/A 08/30/2019   Procedure: COLONOSCOPY WITH PROPOFOL;  Surgeon: Toledo, Benay Pike, MD;  Location: [facility];  Service: Gastroenterology;  Laterality: N/A;   CYSTOSCOPY WITH STENT PLACEMENT Bilateral 06/05/2016   Procedure: CYSTOSCOPY WITH STENT PLACEMENT;  Surgeon: Nickie Retort, MD;  Location: [facility];  Service: Urology;  Laterality: Bilateral;   CYSTOSCOPY/URETEROSCOPY/HOLMIUM LASER/STENT PLACEMENT Left 04/13/2016   Procedure: CYSTOSCOPY/RETROGRADE PYELOGRAM/URETEROSCOPY WITH HOLMIUM LASER LITHOTRIPSY//STENT PLACEMENT;  Surgeon: Festus Aloe, MD;  Location: [facility];  Service: Urology;  Laterality: Left;   ESOPHAGOGASTRODUODENOSCOPY (EGD) WITH PROPOFOL N/A 12/27/2015   Procedure: ESOPHAGOGASTRODUODENOSCOPY (EGD) WITH PROPOFOL;  Surgeon: Manya Silvas, MD;  Location: [facility];  Service: Endoscopy;  Laterality: N/A;   ESOPHAGOGASTRODUODENOSCOPY (EGD) WITH PROPOFOL N/A  08/30/2019   Procedure: ESOPHAGOGASTRODUODENOSCOPY (EGD) WITH PROPOFOL;  Surgeon: Toledo, Benay Pike, MD;  Location: [facility];  Service: Gastroenterology;  Laterality: N/A;   EXTRACORPOREAL SHOCK WAVE LITHOTRIPSY Right 12/30/2017   Procedure: EXTRACORPOREAL SHOCK WAVE LITHOTRIPSY (ESWL);  Surgeon: Royston Cowper, MD;  Location: [facility];  Service: Urology;  Laterality: Right;   HERNIA REPAIR  6808   umbilical   KNEE ARTHROSCOPY Right 2015   had bursa sack repaired   KNEE ARTHROSCOPY WITH MEDIAL MENISECTOMY Right 10/11/2020   Procedure: Right knee  arthroscopy partial medial meniscectomy;  Surgeon: Leim Fabry, MD;  Location: [place];  Service: Orthopedics;  Laterality: Right;  Diabetic - oral meds sleep apnea   KNEE ARTHROSCOPY WITH MENISCAL REPAIR Right 11/18/2018   Procedure: KNEE ARTHROSCOPY WITH MENISCAL REPAIR AND CHONDROPLASTY;  Surgeon: Leim Fabry, MD;  Location: [place];  Service: Orthopedics;  Laterality: Right;  Diabetic - oral meds sleep apnea SUPINE WITH ACL LEG HOLDER SMITH AND NEWPHEW CETERIX   PREPATELLAR BURSA EXCISION Left 1993   and ostetomy. had at least 5 surgeries in 15 years   SHOULDER SURGERY Right 2011   tendon was shredded and was trimmed, repaired and reattached. Screws in shoulder   URETEROSCOPY WITH HOLMIUM LASER LITHOTRIPSY Bilateral 06/05/2016   Procedure: URETEROSCOPY WITH HOLMIUM LASER LITHOTRIPSY;  Surgeon: Nickie Retort, MD;  Location: [facility];  Service: Urology;  Laterality: Bilateral;   VASECTOMY  2002     Current Outpatient Medications  Medication Sig Dispense Refill   acetaminophen (TYLENOL) 500 MG tablet Take 2 tablets (1,000 mg total) by mouth every 8 (eight) hours. 90 tablet 2   amitriptyline (ELAVIL) 25 MG tablet Take 25 mg by mouth at bedtime. Recurring Cough- Dr Joya Gaskins-  11   atorvastatin (LIPITOR) 80 MG tablet TAKE ONE TABLET BY MOUTH ONE TIME DAILY 90 tablet 1   azelastine (OPTIVAR) 0.05 % ophthalmic solution 1 drop into affected eye     Azelastine-Fluticasone (DYMISTA) 137-50 MCG/ACT SUSP Place 1 spray into both nostrils 2 (two) times daily.      benzonatate (TESSALON) 200 MG capsule Take by mouth.     calcium citrate-vitamin D (CITRACAL+D) 315-200 MG-UNIT tablet Take by mouth.     ciclopirox (PENLAC) 8 % solution Apply 1 application topically at bedtime. Apply over nail and surrounding skin. Apply daily over previous coat. After seven (7) days, may remove with alcohol and continue cycle. 6.6 mL 5   Continuous Blood Gluc Sensor (FREESTYLE LIBRE 2 SENSOR)  MISC USE 1 KIT EVERY 14 DAYS FOR GLUCOSE MONITORING     cyanocobalamin (,VITAMIN B-12,) 1000 MCG/ML injection ADMINISTER 1 ML(1000 MCG) IN THE MUSCLE EVERY 30 DAYS 1 mL 11   dapagliflozin propanediol (FARXIGA) 10 MG TABS tablet Take 10 mg by mouth daily.     ezetimibe (ZETIA) 10 MG tablet TAKE 1 TABLET(10 MG) BY MOUTH DAILY 90 tablet 3   fenofibrate (TRICOR) 145 MG tablet Take 1 tablet (145 mg total) by mouth daily. 90 tablet 3   ferrous gluconate (FERGON) 324 MG tablet Take 324 mg by mouth 3 (three) times daily with meals.     folic acid (FOLVITE) 1 MG tablet TAKE 1 TABLET(1 MG) BY MOUTH DAILY 90 tablet 0   gabapentin (NEURONTIN) 300 MG capsule Take 300 mg by mouth 3 (three) times daily. For chronic cough/laryngeal inflammation     glimepiride (AMARYL) 2 MG tablet Take 4 tablets (8 mg total) by mouth See admin instructions. Take 43m with breakfast, 250mwith  lunch, and 110m with supper. (Patient taking differently: Take 2 mg by mouth 2 (two) times daily. One in the morning and one in the evening) 360 tablet 1   glucose blood (PRECISION QID TEST) test strip Use once daily Use as instructed.     HYDROcodone-acetaminophen (NORCO) 5-325 MG tablet Take 1-2 tablets by mouth every 4 (four) hours as needed for moderate pain or severe pain. 10 tablet 0   icosapent Ethyl (VASCEPA) 1 g capsule TAKE TWO CAPSULES BY MOUTH TWICE A DAY 360 capsule 1   ipratropium (ATROVENT) 0.06 % nasal spray Place 2 sprays into both nostrils 2 (two) times daily.   3   ketoconazole (NIZORAL) 2 % shampoo WASH SCALP EVERY OTHER WASHING ALTERNATING WITH HEAD AND SHOULDERS. LET SIT SEVERAL MINUTES BEFORE RINSING.     levocetirizine (XYZAL) 5 MG tablet Take 5 mg by mouth every evening.   11   losartan (COZAAR) 50 MG tablet Take 1 tablet by mouth daily.     metFORMIN (GLUCOPHAGE) 850 MG tablet TAKE 1 TABLET(850 MG) BY MOUTH THREE TIMES DAILY WITH MEALS 270 tablet 1   ondansetron (ZOFRAN ODT) 4 MG disintegrating tablet Take 1 tablet (4 mg  total) by mouth every 8 (eight) hours as needed for nausea or vomiting. 20 tablet 0   pantoprazole (PROTONIX) 40 MG tablet Take 1 tablet (40 mg total) by mouth 2 (two) times daily. 180 tablet 1   pioglitazone (ACTOS) 30 MG tablet Take by mouth.     polyethylene glycol-electrolytes (NULYTELY/GOLYTELY) 420 g solution STOOL SOFT.     PROAIR RESPICLICK 1347(90 Base) MCG/ACT AEPB Inhale 2 puffs into the lungs every 6 (six) hours as needed (shortness of breath).   0   silodosin (RAPAFLO) 4 MG CAPS capsule Take 1 tablet by mouth daily.     sucralfate (CARAFATE) 1 g tablet TAKE 1 TABLET(1 GRAM) BY MOUTH THREE TIMES DAILY WITH MEALS 270 tablet 1   SYMBICORT 160-4.5 MCG/ACT inhaler Inhale 2 puffs into the lungs 2 (two) times daily.  3   tadalafil (CIALIS) 20 MG tablet Take by mouth.     tiZANidine (ZANAFLEX) 2 MG tablet Take 2-4 mg by mouth at bedtime as needed for muscle spasms.      vitamin C (ASCORBIC ACID) 500 MG tablet Take 500 mg by mouth daily.     No current facility-administered medications for this visit.    Allergies:   Dulaglutide, Monosodium glutamate, and Ciprofloxacin    ROS:  Please see the history of present illness.   Otherwise, review of systems are positive for none.   All other systems are reviewed and negative.    PHYSICAL EXAM: VS:  BP 126/86   Pulse 93   Ht 5' 11"  (1.803 m)   Wt 186 lb 12.8 oz (84.7 kg)   SpO2 96%   BMI 26.05 kg/m  , BMI Body mass index is 26.05 kg/m. GENERAL:  Well appearing NECK:  No jugular venous distention, waveform within normal limits, carotid upstroke brisk and symmetric, no bruits, no thyromegaly LUNGS:  Clear to auscultation bilaterally CHEST:  Unremarkable HEART:  PMI not displaced or sustained,S1 and S2 within normal limits, no S3, no S4, no clicks, no rubs, no murmurs ABD:  Flat, positive bowel sounds normal in frequency in pitch, no bruits, no rebound, no guarding, no midline pulsatile mass, no hepatomegaly, no splenomegaly EXT:  2 plus  pulses throughout, no edema, no cyanosis no clubbing   EKG:  EKG is not ordered today.  Recent Labs: No results found for requested labs within last 8760 hours.    Lipid Panel    Component Value Date/Time   CHOL 134 06/27/2020 0816   TRIG 213 (H) 06/27/2020 0816   HDL 29 (L) 06/27/2020 0816   CHOLHDL 4.6 06/27/2020 0816   CHOLHDL 5.9 07/30/2018 0513   VLDL 63 (H) 07/30/2018 0513   LDLCALC 70 06/27/2020 0816      Wt Readings from Last 3 Encounters:  08/26/21 186 lb 12.8 oz (84.7 kg)  05/13/21 186 lb 9.6 oz (84.6 kg)  05/06/21 186 lb (84.4 kg)      Other studies Reviewed: Additional studies/ records that were reviewed today include: None Review of the above records demonstrates:  Please see elsewhere in the note.     ASSESSMENT AND PLAN:  CORONARY CALCIUM:   He had a POET (Plain Old Exercise Treadmill) without high risk findings.  We had a long discussion about this.  He has no symptoms.  He is getting optimal medical therapy and aggressive risk reduction.  I plan to bring him back for a treadmill again in July but more importantly we talked about lifestyle, risk reduction and awareness of potential symptoms that he could be looking for.  He was comfortable with this conversation.   DYSLIPIDEMIA:   LDL most recently was 71 with an HDL of 33.5.  He is seen by Dr. Debara Pickett.   DM: He is followed by endocrinology at [facility].  PALPITATIONS: He is not feeling these.  No change in therapy.   Current medicines are reviewed at length with the patient today.  The patient does not have concerns regarding medicines.  The following changes have been made:  None  Labs/ tests ordered today include: None  Orders Placed This Encounter  Procedures   EXERCISE TOLERANCE TEST (ETT)     Disposition:   FU with me after the POET (Plain Old Exercise Treadmill)  Signed, Minus Breeding, MD  08/26/2021 5:48 PM    [facility]

## 2021-08-26 ENCOUNTER — Other Ambulatory Visit: Payer: Self-pay

## 2021-08-26 ENCOUNTER — Ambulatory Visit (INDEPENDENT_AMBULATORY_CARE_PROVIDER_SITE_OTHER): Payer: Managed Care, Other (non HMO) | Admitting: Cardiology

## 2021-08-26 ENCOUNTER — Encounter: Payer: Self-pay | Admitting: Cardiology

## 2021-08-26 VITALS — BP 126/86 | HR 93 | Ht 71.0 in | Wt 186.8 lb

## 2021-08-26 DIAGNOSIS — R931 Abnormal findings on diagnostic imaging of heart and coronary circulation: Secondary | ICD-10-CM | POA: Diagnosis not present

## 2021-08-26 DIAGNOSIS — E785 Hyperlipidemia, unspecified: Secondary | ICD-10-CM | POA: Diagnosis not present

## 2021-08-26 DIAGNOSIS — E118 Type 2 diabetes mellitus with unspecified complications: Secondary | ICD-10-CM

## 2021-08-26 NOTE — Patient Instructions (Signed)
Medication Instructions:  The current medical regimen is effective;  continue present plan and medications.  *If you need a refill on your cardiac medications before your next appointment, please call your pharmacy*   Testing/Procedures: Your physician has requested that you have an exercise tolerance test, this is a screening tool to track your fitness level. This test evaluates the your exercise capacity by measuring cardiovascular response to exercise, the stress response is induced by exercise (exercise-treadmill).  Graded exercise test is also known as maximal exercise test or stress EKG test  . Please also follow instruction sheet given.   Follow-Up: At [facility], you and your health needs are our priority.  As part of our continuing mission to provide you with exceptional heart care, we have created designated Provider Care Teams.  These Care Teams include your primary Cardiologist (physician) and Advanced Practice Providers (APPs -  Physician Assistants and Nurse Practitioners) who all work together to provide you with the care you need, when you need it.  We recommend signing up for the patient portal called "MyChart".  Sign up information is provided on this After Visit Summary.  MyChart is used to connect with patients for Virtual Visits (Telemedicine).  Patients are able to view lab/test results, encounter notes, upcoming appointments, etc.  Non-urgent messages can be sent to your provider as well.   To learn more about what you can do with MyChart, go to [email or web address].    Your next appointment:   9 month(s)  The format for your next appointment:   In Person  Provider:   Minus Breeding, MD

## 2021-08-30 ENCOUNTER — Other Ambulatory Visit: Payer: Self-pay | Admitting: Internal Medicine

## 2021-08-30 DIAGNOSIS — K2270 Barrett's esophagus without dysplasia: Secondary | ICD-10-CM

## 2021-08-31 NOTE — Telephone Encounter (Signed)
No longer pt at this prescriber effective 02/05/21

## 2021-09-06 ENCOUNTER — Other Ambulatory Visit: Payer: Self-pay

## 2021-09-06 ENCOUNTER — Emergency Department
Admission: EM | Admit: 2021-09-06 | Discharge: 2021-09-07 | Disposition: A | Discharge status: Home / Self Care | Payer: Managed Care, Other (non HMO) | Attending: Emergency Medicine | Admitting: Emergency Medicine

## 2021-09-06 DIAGNOSIS — Z7984 Long term (current) use of oral hypoglycemic drugs: Secondary | ICD-10-CM | POA: Diagnosis not present

## 2021-09-06 DIAGNOSIS — N189 Chronic kidney disease, unspecified: Secondary | ICD-10-CM | POA: Diagnosis not present

## 2021-09-06 DIAGNOSIS — Y92410 Unspecified street and highway as the place of occurrence of the external cause: Secondary | ICD-10-CM | POA: Diagnosis not present

## 2021-09-06 DIAGNOSIS — Z85828 Personal history of other malignant neoplasm of skin: Secondary | ICD-10-CM | POA: Diagnosis not present

## 2021-09-06 DIAGNOSIS — S161XXA Strain of muscle, fascia and tendon at neck level, initial encounter: Secondary | ICD-10-CM | POA: Diagnosis not present

## 2021-09-06 DIAGNOSIS — S199XXA Unspecified injury of neck, initial encounter: Secondary | ICD-10-CM | POA: Diagnosis present

## 2021-09-06 DIAGNOSIS — Z7951 Long term (current) use of inhaled steroids: Secondary | ICD-10-CM | POA: Insufficient documentation

## 2021-09-06 DIAGNOSIS — Y9389 Activity, other specified: Secondary | ICD-10-CM | POA: Diagnosis not present

## 2021-09-06 DIAGNOSIS — J45909 Unspecified asthma, uncomplicated: Secondary | ICD-10-CM | POA: Insufficient documentation

## 2021-09-06 DIAGNOSIS — I129 Hypertensive chronic kidney disease with stage 1 through stage 4 chronic kidney disease, or unspecified chronic kidney disease: Secondary | ICD-10-CM | POA: Insufficient documentation

## 2021-09-06 DIAGNOSIS — E1122 Type 2 diabetes mellitus with diabetic chronic kidney disease: Secondary | ICD-10-CM | POA: Diagnosis not present

## 2021-09-06 DIAGNOSIS — M5136 Other intervertebral disc degeneration, lumbar region: Secondary | ICD-10-CM | POA: Insufficient documentation

## 2021-09-06 DIAGNOSIS — S3992XA Unspecified injury of lower back, initial encounter: Secondary | ICD-10-CM | POA: Insufficient documentation

## 2021-09-06 DIAGNOSIS — M545 Low back pain, unspecified: Secondary | ICD-10-CM
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
## 2021-09-07 ENCOUNTER — Emergency Department: Payer: Managed Care, Other (non HMO)

## 2021-09-07 ENCOUNTER — Encounter: Payer: Self-pay | Admitting: Radiology

## 2021-09-07 DIAGNOSIS — S161XXA Strain of muscle, fascia and tendon at neck level, initial encounter: Secondary | ICD-10-CM | POA: Diagnosis not present

## 2021-09-07 MED ORDER — TRAMADOL HCL 50 MG PO TABS: 50.0000 mg | ORAL_TABLET | Freq: Four times a day (QID) | ORAL | 0 refills | Status: DC | PRN

## 2021-09-07 NOTE — ED Notes (Signed)
Patients spouse to desk, asking about wait and expresses confusion as to why she received an xray but her husband did not.  States she is complaining of continued pain to neck and back.  New orders placed.

## 2021-09-07 NOTE — Discharge Instructions (Addendum)
Xrays of your cervical, thoracic and lumbar spine were negative today.   You are being provided a prescription for opiates (also known as narcotics) for pain control.  Opiates can be addictive and should only be used when absolutely necessary for pain control when other alternatives do not work.  We recommend you only use them for the recommended amount of time and only as prescribed.  Please do not take with other sedative medications or alcohol.  Please do not drive, operate machinery, make important decisions while taking opiates.  Please note that these medications can be addictive and have high abuse potential.  Patients can become addicted to narcotics after only taking them for a few days.  Please keep these medications locked away from children, teenagers or any family members with history of substance abuse.  Narcotic pain medicine may also make you constipated.  You may use over-the-counter medications such as MiraLAX, Colace to prevent constipation.  If you become constipated you may use over-the-counter enemas as needed.  Itching and nausea are common side effects of narcotic pain medication.  If you develop uncontrolled vomiting or a rash, please stop these medications.
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
## 2021-10-08 ENCOUNTER — Other Ambulatory Visit: Payer: Self-pay | Admitting: Surgery

## 2021-10-15 ENCOUNTER — Encounter
Admission: RE | Admit: 2021-10-15 | Discharge: 2021-10-15 | Disposition: A | Discharge status: Home / Self Care | Payer: Managed Care, Other (non HMO) | Source: Ambulatory Visit | Attending: Surgery | Admitting: Surgery

## 2021-10-15 ENCOUNTER — Other Ambulatory Visit: Payer: Self-pay

## 2021-10-15 VITALS — BP 113/76 | HR 79 | Resp 16 | Ht 69.5 in | Wt 183.0 lb

## 2021-10-15 DIAGNOSIS — E118 Type 2 diabetes mellitus with unspecified complications: Secondary | ICD-10-CM | POA: Insufficient documentation

## 2021-10-15 DIAGNOSIS — Z01812 Encounter for preprocedural laboratory examination: Secondary | ICD-10-CM | POA: Insufficient documentation

## 2021-10-15 DIAGNOSIS — Z01818 Encounter for other preprocedural examination: Secondary | ICD-10-CM

## 2021-10-15 DIAGNOSIS — N289 Disorder of kidney and ureter, unspecified: Secondary | ICD-10-CM | POA: Insufficient documentation

## 2021-10-15 HISTORY — DX: Atherosclerotic heart disease of native coronary artery without angina pectoris: I25.10

## 2021-10-15 HISTORY — DX: Unspecified hemorrhoids: K64.9

## 2021-10-15 LAB — COMPREHENSIVE METABOLIC PANEL
ALT: 25 U/L (ref 0–44)
AST: 24 U/L (ref 15–41)
Albumin: 4.1 g/dL (ref 3.5–5.0)
Alkaline Phosphatase: 40 U/L (ref 38–126)
Anion gap: 8 (ref 5–15)
BUN: 28 mg/dL — ABNORMAL HIGH (ref 6–20)
CO2: 27 mmol/L (ref 22–32)
Calcium: 9.4 mg/dL (ref 8.9–10.3)
Chloride: 101 mmol/L (ref 98–111)
Creatinine, Ser: 1.22 mg/dL (ref 0.61–1.24)
GFR, Estimated: >60 mL/min (ref >60)
Glucose, Bld: 184 mg/dL — ABNORMAL HIGH (ref 70–99)
Potassium: 3.7 mmol/L (ref 3.5–5.1)
Sodium: 136 mmol/L (ref 135–145)
Total Bilirubin: 0.8 mg/dL (ref 0.3–1.2)
Total Protein: 7.1 g/dL (ref 6.5–8.1)

## 2021-10-15 LAB — CBC WITH DIFFERENTIAL/PLATELET
Abs Immature Granulocytes: 0.02 10*3/uL (ref 0.00–0.07)
Basophils Absolute: 0 10*3/uL (ref 0.0–0.1)
Basophils Relative: 0 %
Eosinophils Absolute: 0.1 10*3/uL (ref 0.0–0.5)
Eosinophils Relative: 1 %
HCT: 38.6 % — ABNORMAL LOW (ref 39.0–52.0)
Hemoglobin: 12.7 g/dL — ABNORMAL LOW (ref 13.0–17.0)
Immature Granulocytes: 0 %
Lymphocytes Relative: 36 %
Lymphs Abs: 2.8 10*3/uL (ref 0.7–4.0)
MCH: 30 pg (ref 26.0–34.0)
MCHC: 32.9 g/dL (ref 30.0–36.0)
MCV: 91 fL (ref 80.0–100.0)
Monocytes Absolute: 0.6 10*3/uL (ref 0.1–1.0)
Monocytes Relative: 7 %
Neutro Abs: 4.3 10*3/uL (ref 1.7–7.7)
Neutrophils Relative %: 56 %
Platelets: 302 10*3/uL (ref 150–400)
RBC: 4.24 MIL/uL (ref 4.22–5.81)
RDW: 14.3 % (ref 11.5–15.5)
WBC: 7.7 10*3/uL (ref 4.0–10.5)
nRBC: 0 % (ref 0.0–0.2)

## 2021-10-15 LAB — URINALYSIS, ROUTINE W REFLEX MICROSCOPIC
Bacteria, UA: NONE SEEN
Bilirubin Urine: NEGATIVE
Glucose, UA: >1000 mg/dL — AB
Hgb urine dipstick: NEGATIVE
Ketones, ur: NEGATIVE mg/dL
Leukocytes,Ua: NEGATIVE
Nitrite: NEGATIVE
Protein, ur: NEGATIVE mg/dL
Specific Gravity, Urine: 1.02 (ref 1.005–1.030)
Squamous Epithelial / HPF: NONE SEEN (ref 0–5)
pH: 5.5 (ref 5.0–8.0)

## 2021-10-15 LAB — SURGICAL PCR SCREEN
MRSA, PCR: NEGATIVE
Staphylococcus aureus: NEGATIVE

## 2021-10-15 LAB — TYPE AND SCREEN
ABO/RH(D): O POS
Antibody Screen: NEGATIVE

## 2021-10-15 NOTE — Patient Instructions (Addendum)
Your procedure is scheduled on:10-28-21 Tuesday Report to the Registration Desk on the 1st floor of the [facility].Then proceed to the 2nd floor Surgery Desk in the [facility] To find out your arrival time, please call [phone] between 1PM - 3PM on:10-27-21 Monday  REMEMBER: Instructions that are not followed completely may result in serious medical risk, up to and including death; or upon the discretion of your surgeon and anesthesiologist your surgery may need to be rescheduled.  Do not eat food after midnight the night before surgery.  No gum chewing, lozengers or hard candies.  You may however, drink Water up to 2 hours before you are scheduled to arrive for your surgery. Do not drink anything within 2 hours of your scheduled arrival time.  Type 1 and Type 2 diabetics should only drink water.  TAKE THESE MEDICATIONS THE MORNING OF SURGERY WITH A SIP OF WATER: -atorvastatin (LIPITOR) 80 MG tablet -ezetimibe (ZETIA) 10 MG tablet -fenofibrate (TRICOR) 145 MG tablet -gabapentin (NEURONTIN) 300 MG capsule -pantoprazole (PROTONIX) 40 MG tablet-take one the night before and one on the morning of surgery - helps to prevent nausea after surgery.)  Use your Albuterol Inhaler the morning of surgery and bring inhaler to the hospital   Stop your dapagliflozin propanediol (FARXIGA) 10 MG TABS tablet 3 days prior to surgery-Last dose on 10-24-21 Friday  Stop your metFORMIN (GLUCOPHAGE) 850 MG tablet 2 days prior to surgery-Last dose on 10-25-21 Saturday  One week prior to surgery: Stop Anti-inflammatories (NSAIDS) such as meloxicam (MOBIC) 15 MG tablet, Advil, Aleve, Ibuprofen, Motrin, Naproxen, Naprosyn and Aspirin based products such as Excedrin, Goodys Powder, BC Powder.You may however, continue to take Tylenol/Tramadol/Hydrocodone if needed for pain up until the day of surgery.  Stop ANY OVER THE COUNTER supplements/vitamins 7 days prior to surgery (ferrous gluconate (FERGON) 324 MG  tablet, vitamin C (ASCORBIC ACID) 500 MG tablet)  No Alcohol for 24 hours before or after surgery.  No Smoking including e-cigarettes for 24 hours prior to surgery.  No chewable tobacco products for at least 6 hours prior to surgery.  No nicotine patches on the day of surgery.  Do not use any "recreational" drugs for at least a week prior to your surgery.  Please be advised that the combination of cocaine and anesthesia may have negative outcomes, up to and including death. If you test positive for cocaine, your surgery will be cancelled.  On the morning of surgery brush your teeth with toothpaste and water, you may rinse your mouth with mouthwash if you wish. Do not swallow any toothpaste or mouthwash.  Use CHG Soap as directed on instruction sheet.  Bring your C-Pap machine to the hospital  Do not wear jewelry, make-up, hairpins, clips or nail polish.  Do not wear lotions, powders, or perfumes.   Do not shave body from the neck down 48 hours prior to surgery just in case you cut yourself which could leave a site for infection.  Also, freshly shaved skin may become irritated if using the CHG soap.  Contact lenses, hearing aids and dentures may not be worn into surgery.  Do not bring valuables to the hospital. [facility] is not responsible for any missing/lost belongings or valuables.   Notify your doctor if there is any change in your medical condition (cold, fever, infection).  Wear comfortable clothing (specific to your surgery type) to the hospital.  After surgery, you can help prevent lung complications by doing breathing exercises.  Take deep breaths  and cough every 1-2 hours. Your doctor may order a device called an Incentive Spirometer to help you take deep breaths. When coughing or sneezing, hold a pillow firmly against your incision with both hands. This is called "splinting." Doing this helps protect your incision. It also decreases belly discomfort.  If you are  being admitted to the hospital overnight, leave your suitcase in the car. After surgery it may be brought to your room.  If you are being discharged the day of surgery, you will not be allowed to drive home. You will need a responsible adult (18 years or older) to drive you home and stay with you that night.   If you are taking public transportation, you will need to have a responsible adult (18 years or older) with you. Please confirm with your physician that it is acceptable to use public transportation.   Please call the [facility] at [phone] if you have any questions about these instructions.  Surgery Visitation Policy:  Patients undergoing a surgery or procedure may have one family member or support person with them as long as that person is not COVID-19 positive or experiencing its symptoms.  That person may remain in the waiting area during the procedure and may rotate out with other people.  Inpatient Visitation:    Visiting hours are 7 a.m. to 8 p.m. Up to two visitors ages 16+ are allowed at one time in a patient room. The visitors may rotate out with other people during the day. Visitors must check out when they leave, or other visitors will not be allowed. One designated support person may remain overnight. The visitor must pass COVID-19 screenings, use hand sanitizer when entering and exiting the patient's room and wear a mask at all times, including in the patient's room. Patients must also wear a mask when staff or their visitor are in the room. Masking is required regardless of vaccination status.

## 2021-10-16 LAB — HEMOGLOBIN A1C
Hgb A1c MFr Bld: 8.1 % — ABNORMAL HIGH (ref 4.8–5.6)
Mean Plasma Glucose: 186 mg/dL

## 2021-10-17 ENCOUNTER — Other Ambulatory Visit: Payer: Self-pay | Admitting: Surgery

## 2021-10-17 DIAGNOSIS — M1612 Unilateral primary osteoarthritis, left hip: Secondary | ICD-10-CM

## 2021-10-22 ENCOUNTER — Ambulatory Visit: Payer: Managed Care, Other (non HMO)

## 2021-10-24 ENCOUNTER — Other Ambulatory Visit: Payer: Self-pay

## 2021-10-24 ENCOUNTER — Other Ambulatory Visit
Admission: RE | Admit: 2021-10-24 | Discharge: 2021-10-24 | Disposition: A | Discharge status: Home / Self Care | Payer: Managed Care, Other (non HMO) | Source: Ambulatory Visit | Attending: Surgery | Admitting: Surgery

## 2021-10-24 DIAGNOSIS — Z20822 Contact with and (suspected) exposure to covid-19: Secondary | ICD-10-CM | POA: Diagnosis not present

## 2021-10-24 DIAGNOSIS — Z01812 Encounter for preprocedural laboratory examination: Secondary | ICD-10-CM | POA: Insufficient documentation

## 2021-10-24 LAB — SARS CORONAVIRUS 2 (TAT 6-24 HRS): SARS Coronavirus 2: NEGATIVE

## 2021-10-28 ENCOUNTER — Ambulatory Visit: Payer: Managed Care, Other (non HMO) | Admitting: Anesthesiology

## 2021-10-28 ENCOUNTER — Observation Stay: Payer: Managed Care, Other (non HMO)

## 2021-10-28 ENCOUNTER — Encounter: Payer: Self-pay | Admitting: Surgery

## 2021-10-28 ENCOUNTER — Encounter
Admission: RE | Disposition: A | Discharge status: Home / Self Care | Payer: Self-pay | Source: Home / Self Care | Attending: Surgery

## 2021-10-28 ENCOUNTER — Ambulatory Visit: Payer: Managed Care, Other (non HMO) | Admitting: Urgent Care

## 2021-10-28 ENCOUNTER — Observation Stay
Admission: RE | Admit: 2021-10-28 | Discharge: 2021-10-31 | Disposition: A | Discharge status: Home / Self Care | Payer: Managed Care, Other (non HMO) | Attending: Surgery | Admitting: Surgery

## 2021-10-28 ENCOUNTER — Other Ambulatory Visit: Payer: Self-pay

## 2021-10-28 DIAGNOSIS — I2510 Atherosclerotic heart disease of native coronary artery without angina pectoris: Secondary | ICD-10-CM | POA: Insufficient documentation

## 2021-10-28 DIAGNOSIS — J45909 Unspecified asthma, uncomplicated: Secondary | ICD-10-CM | POA: Insufficient documentation

## 2021-10-28 DIAGNOSIS — Z09 Encounter for follow-up examination after completed treatment for conditions other than malignant neoplasm: Secondary | ICD-10-CM

## 2021-10-28 DIAGNOSIS — Z85828 Personal history of other malignant neoplasm of skin: Secondary | ICD-10-CM | POA: Insufficient documentation

## 2021-10-28 DIAGNOSIS — I129 Hypertensive chronic kidney disease with stage 1 through stage 4 chronic kidney disease, or unspecified chronic kidney disease: Secondary | ICD-10-CM | POA: Diagnosis not present

## 2021-10-28 DIAGNOSIS — E1122 Type 2 diabetes mellitus with diabetic chronic kidney disease: Secondary | ICD-10-CM | POA: Insufficient documentation

## 2021-10-28 DIAGNOSIS — Z96642 Presence of left artificial hip joint: Secondary | ICD-10-CM

## 2021-10-28 DIAGNOSIS — M1612 Unilateral primary osteoarthritis, left hip: Secondary | ICD-10-CM | POA: Diagnosis not present

## 2021-10-28 DIAGNOSIS — N189 Chronic kidney disease, unspecified: Secondary | ICD-10-CM | POA: Insufficient documentation

## 2021-10-28 HISTORY — PX: TOTAL HIP ARTHROPLASTY: SHX124

## 2021-10-28 LAB — GLUCOSE, CAPILLARY
Glucose-Capillary: 122 mg/dL — ABNORMAL HIGH (ref 70–99)
Glucose-Capillary: 210 mg/dL — ABNORMAL HIGH (ref 70–99)
Glucose-Capillary: 155 mg/dL — ABNORMAL HIGH (ref 70–99)

## 2021-10-28 LAB — ABO/RH: ABO/RH(D): O POS

## 2021-10-28 SURGERY — ARTHROPLASTY, HIP, TOTAL,POSTERIOR APPROACH
Anesthesia: Spinal | Site: Hip | Laterality: Left

## 2021-10-28 MED ORDER — FENTANYL CITRATE (PF) 100 MCG/2ML IJ SOLN
INTRAMUSCULAR | Status: AC
  Filled 2021-10-28: qty 2

## 2021-10-28 MED ORDER — SODIUM CHLORIDE 0.9 % IV SOLN: INTRAVENOUS | Status: DC

## 2021-10-28 MED ORDER — FENOFIBRATE 160 MG PO TABS
160.0000 mg | ORAL_TABLET | Freq: Every day | ORAL | Status: DC
  Administered 2021-10-29 – 2021-10-31 (×3): 160 mg via ORAL
  Filled 2021-10-28 (×3): qty 1

## 2021-10-28 MED ORDER — TRANEXAMIC ACID 1000 MG/10ML IV SOLN
INTRAVENOUS | Status: DC | PRN
  Administered 2021-10-28: 11:00:00 1000 mg via TOPICAL

## 2021-10-28 MED ORDER — CEFAZOLIN SODIUM-DEXTROSE 2-4 GM/100ML-% IV SOLN
INTRAVENOUS | Status: AC
  Filled 2021-10-28: qty 100

## 2021-10-28 MED ORDER — CICLOPIROX 8 % EX SOLN: 1.0000 "application " | Freq: Every day | CUTANEOUS | Status: DC

## 2021-10-28 MED ORDER — FOLIC ACID 1 MG PO TABS
1.0000 mg | ORAL_TABLET | Freq: Every day | ORAL | Status: DC
  Administered 2021-10-29 – 2021-10-31 (×3): 1 mg via ORAL
  Filled 2021-10-28 (×3): qty 1

## 2021-10-28 MED ORDER — DAPAGLIFLOZIN PROPANEDIOL 10 MG PO TABS
10.0000 mg | ORAL_TABLET | Freq: Every day | ORAL | Status: DC
  Administered 2021-10-29 – 2021-10-31 (×3): 10 mg via ORAL
  Filled 2021-10-28 (×3): qty 1

## 2021-10-28 MED ORDER — PROPOFOL 1000 MG/100ML IV EMUL
INTRAVENOUS | Status: AC
  Filled 2021-10-28: qty 100

## 2021-10-28 MED ORDER — FLEET ENEMA 7-19 GM/118ML RE ENEM: 1.0000 | ENEMA | Freq: Once | RECTAL | Status: DC | PRN

## 2021-10-28 MED ORDER — IPRATROPIUM BROMIDE 0.06 % NA SOLN
2.0000 | Freq: Two times a day (BID) | NASAL | Status: DC
  Administered 2021-10-30 – 2021-10-31 (×2): 2 via NASAL
  Filled 2021-10-28: qty 15

## 2021-10-28 MED ORDER — INSULIN ASPART 100 UNIT/ML IJ SOLN
0.0000 [IU] | Freq: Three times a day (TID) | INTRAMUSCULAR | Status: DC
  Administered 2021-10-28: 18:00:00 5 [IU] via SUBCUTANEOUS
  Administered 2021-10-30: 18:00:00 3 [IU] via SUBCUTANEOUS
  Administered 2021-10-30: 10:00:00 5 [IU] via SUBCUTANEOUS
  Administered 2021-10-30: 13:00:00 3 [IU] via SUBCUTANEOUS
  Administered 2021-10-31: 09:00:00 2 [IU] via SUBCUTANEOUS
  Filled 2021-10-28 (×4): qty 1

## 2021-10-28 MED ORDER — BUPIVACAINE HCL (PF) 0.5 % IJ SOLN
INTRAMUSCULAR | Status: DC | PRN
  Administered 2021-10-28: 2.6 mL via INTRATHECAL

## 2021-10-28 MED ORDER — APIXABAN 2.5 MG PO TABS
2.5000 mg | ORAL_TABLET | Freq: Two times a day (BID) | ORAL | Status: DC
Start: 2021-10-29 — End: 2021-10-31
  Administered 2021-10-29 – 2021-10-31 (×5): 2.5 mg via ORAL
  Filled 2021-10-28 (×6): qty 1

## 2021-10-28 MED ORDER — METOCLOPRAMIDE HCL 10 MG PO TABS: 5.0000 mg | ORAL_TABLET | Freq: Three times a day (TID) | ORAL | Status: DC | PRN

## 2021-10-28 MED ORDER — SODIUM CHLORIDE (PF) 0.9 % IJ SOLN
INTRAMUSCULAR | Status: DC | PRN
  Administered 2021-10-28: 11:00:00 90 mL

## 2021-10-28 MED ORDER — ASCORBIC ACID 500 MG PO TABS
500.0000 mg | ORAL_TABLET | Freq: Every day | ORAL | Status: DC
  Administered 2021-10-29 – 2021-10-31 (×3): 500 mg via ORAL
  Filled 2021-10-28 (×3): qty 1

## 2021-10-28 MED ORDER — KETOROLAC TROMETHAMINE 30 MG/ML IJ SOLN
INTRAMUSCULAR | Status: AC
  Filled 2021-10-28: qty 1

## 2021-10-28 MED ORDER — WHITE PETROLATUM EX OINT
TOPICAL_OINTMENT | CUTANEOUS | Status: AC
  Filled 2021-10-28: qty 5

## 2021-10-28 MED ORDER — OXYCODONE HCL 5 MG PO TABS
ORAL_TABLET | ORAL | Status: AC
  Filled 2021-10-28: qty 2

## 2021-10-28 MED ORDER — STERILE WATER FOR IRRIGATION IR SOLN
Status: DC | PRN
  Administered 2021-10-28: 1000 mL

## 2021-10-28 MED ORDER — PROPOFOL 10 MG/ML IV BOLUS
INTRAVENOUS | Status: DC | PRN
  Administered 2021-10-28: 50 mg via INTRAVENOUS
  Administered 2021-10-28: 30 mg via INTRAVENOUS

## 2021-10-28 MED ORDER — 0.9 % SODIUM CHLORIDE (POUR BTL) OPTIME
TOPICAL | Status: DC | PRN
  Administered 2021-10-28: 10:00:00 500 mL

## 2021-10-28 MED ORDER — OXYCODONE HCL 5 MG PO TABS
5.0000 mg | ORAL_TABLET | ORAL | Status: DC | PRN
  Administered 2021-10-28: 18:00:00 10 mg via ORAL
  Administered 2021-10-29: 14:00:00 5 mg via ORAL
  Administered 2021-10-29 – 2021-10-31 (×4): 10 mg via ORAL
  Filled 2021-10-28 (×3): qty 2

## 2021-10-28 MED ORDER — ACETAMINOPHEN 500 MG PO TABS
1000.0000 mg | ORAL_TABLET | Freq: Four times a day (QID) | ORAL | Status: AC
  Administered 2021-10-28 – 2021-10-29 (×2): 1000 mg via ORAL

## 2021-10-28 MED ORDER — METFORMIN HCL 850 MG PO TABS
850.0000 mg | ORAL_TABLET | Freq: Three times a day (TID) | ORAL | Status: DC
  Administered 2021-10-28 – 2021-10-31 (×7): 850 mg via ORAL
  Filled 2021-10-28 (×10): qty 1

## 2021-10-28 MED ORDER — CHLORHEXIDINE GLUCONATE 0.12 % MT SOLN
OROMUCOSAL | Status: AC
  Administered 2021-10-28: 08:00:00 15 mL via OROMUCOSAL
  Filled 2021-10-28: qty 15

## 2021-10-28 MED ORDER — HYDROMORPHONE HCL 1 MG/ML IJ SOLN
INTRAMUSCULAR | Status: AC
  Filled 2021-10-28: qty 0.5

## 2021-10-28 MED ORDER — KETOROLAC TROMETHAMINE 15 MG/ML IJ SOLN
INTRAMUSCULAR | Status: AC
  Administered 2021-10-28: 21:00:00 15 mg via INTRAVENOUS
  Filled 2021-10-28: qty 1

## 2021-10-28 MED ORDER — GABAPENTIN 300 MG PO CAPS
300.0000 mg | ORAL_CAPSULE | Freq: Three times a day (TID) | ORAL | Status: DC
  Administered 2021-10-29 – 2021-10-31 (×5): 300 mg via ORAL
  Filled 2021-10-28 (×5): qty 1

## 2021-10-28 MED ORDER — CHLORHEXIDINE GLUCONATE 0.12 % MT SOLN: 15.0000 mL | Freq: Once | OROMUCOSAL | Status: AC

## 2021-10-28 MED ORDER — GABAPENTIN 300 MG PO CAPS
ORAL_CAPSULE | ORAL | Status: AC
  Administered 2021-10-28: 16:00:00 300 mg via ORAL
  Filled 2021-10-28: qty 1

## 2021-10-28 MED ORDER — AZELASTINE-FLUTICASONE 137-50 MCG/ACT NA SUSP: 1.0000 | Freq: Two times a day (BID) | NASAL | Status: DC

## 2021-10-28 MED ORDER — MAGNESIUM HYDROXIDE 400 MG/5ML PO SUSP: 30.0000 mL | Freq: Every day | ORAL | Status: DC | PRN

## 2021-10-28 MED ORDER — KETOROLAC TROMETHAMINE 30 MG/ML IJ SOLN
30.0000 mg | Freq: Once | INTRAMUSCULAR | Status: AC
  Administered 2021-10-28: 15:00:00 30 mg via INTRAVENOUS

## 2021-10-28 MED ORDER — HYDROMORPHONE HCL 1 MG/ML IJ SOLN
INTRAMUSCULAR | Status: AC
  Filled 2021-10-28: qty 1

## 2021-10-28 MED ORDER — PHENYLEPHRINE HCL-NACL 20-0.9 MG/250ML-% IV SOLN
INTRAVENOUS | Status: AC
  Filled 2021-10-28: qty 250

## 2021-10-28 MED ORDER — KETOTIFEN FUMARATE 0.025 % OP SOLN
1.0000 [drp] | Freq: Two times a day (BID) | OPHTHALMIC | Status: DC
Start: 2021-10-28 — End: 2021-10-28

## 2021-10-28 MED ORDER — BENZONATATE 100 MG PO CAPS
200.0000 mg | ORAL_CAPSULE | Freq: Three times a day (TID) | ORAL | Status: DC | PRN
  Filled 2021-10-28: qty 2

## 2021-10-28 MED ORDER — PROPOFOL 500 MG/50ML IV EMUL
INTRAVENOUS | Status: DC | PRN
  Administered 2021-10-28: 50 ug/kg/min via INTRAVENOUS

## 2021-10-28 MED ORDER — SODIUM CHLORIDE FLUSH 0.9 % IV SOLN
INTRAVENOUS | Status: AC
  Filled 2021-10-28: qty 40

## 2021-10-28 MED ORDER — TRAMADOL HCL 50 MG PO TABS
ORAL_TABLET | ORAL | Status: AC
  Administered 2021-10-28: 15:00:00 50 mg via ORAL
  Filled 2021-10-28: qty 1

## 2021-10-28 MED ORDER — ONDANSETRON HCL 4 MG PO TABS
4.0000 mg | ORAL_TABLET | Freq: Four times a day (QID) | ORAL | Status: DC | PRN
  Administered 2021-10-31: 11:00:00 4 mg via ORAL
  Filled 2021-10-28: qty 1

## 2021-10-28 MED ORDER — CALCIUM CITRATE-VITAMIN D 315-200 MG-UNIT PO TABS: 1.0000 | ORAL_TABLET | Freq: Three times a day (TID) | ORAL | Status: DC

## 2021-10-28 MED ORDER — FERROUS GLUCONATE 324 (38 FE) MG PO TABS
324.0000 mg | ORAL_TABLET | Freq: Three times a day (TID) | ORAL | Status: DC
  Administered 2021-10-28 – 2021-10-31 (×8): 324 mg via ORAL
  Filled 2021-10-28 (×10): qty 1

## 2021-10-28 MED ORDER — ONDANSETRON HCL 4 MG/2ML IJ SOLN
INTRAMUSCULAR | Status: DC | PRN
  Administered 2021-10-28: 4 mg via INTRAVENOUS

## 2021-10-28 MED ORDER — CEFAZOLIN SODIUM-DEXTROSE 2-4 GM/100ML-% IV SOLN: 2.0000 g | Freq: Four times a day (QID) | INTRAVENOUS | Status: AC

## 2021-10-28 MED ORDER — PHENYLEPHRINE HCL-NACL 20-0.9 MG/250ML-% IV SOLN
INTRAVENOUS | Status: DC | PRN
  Administered 2021-10-28: 25 ug/min via INTRAVENOUS

## 2021-10-28 MED ORDER — FENTANYL CITRATE (PF) 100 MCG/2ML IJ SOLN
INTRAMUSCULAR | Status: AC
  Administered 2021-10-28: 12:00:00 25 ug via INTRAVENOUS
  Filled 2021-10-28: qty 2

## 2021-10-28 MED ORDER — ATORVASTATIN CALCIUM 20 MG PO TABS
80.0000 mg | ORAL_TABLET | ORAL | Status: DC
  Administered 2021-10-29 – 2021-10-31 (×3): 80 mg via ORAL
  Filled 2021-10-28: qty 4
  Filled 2021-10-28 (×2): qty 1
  Filled 2021-10-28: qty 4

## 2021-10-28 MED ORDER — ALBUTEROL SULFATE (2.5 MG/3ML) 0.083% IN NEBU: 3.0000 mL | INHALATION_SOLUTION | Freq: Four times a day (QID) | RESPIRATORY_TRACT | Status: DC | PRN

## 2021-10-28 MED ORDER — ACETAMINOPHEN 500 MG PO TABS
ORAL_TABLET | ORAL | Status: AC
  Filled 2021-10-28: qty 2

## 2021-10-28 MED ORDER — ICOSAPENT ETHYL 1 G PO CAPS
2.0000 g | ORAL_CAPSULE | Freq: Two times a day (BID) | ORAL | Status: DC
  Administered 2021-10-28 – 2021-10-31 (×6): 2 g via ORAL
  Filled 2021-10-28 (×8): qty 2

## 2021-10-28 MED ORDER — TAMSULOSIN HCL 0.4 MG PO CAPS
0.4000 mg | ORAL_CAPSULE | Freq: Every day | ORAL | Status: DC
  Administered 2021-10-28 – 2021-10-30 (×3): 0.4 mg via ORAL
  Filled 2021-10-28 (×4): qty 1

## 2021-10-28 MED ORDER — IPRATROPIUM BROMIDE 0.06 % NA SOLN: 2.0000 | Freq: Two times a day (BID) | NASAL | Status: DC

## 2021-10-28 MED ORDER — MIDAZOLAM HCL 2 MG/2ML IJ SOLN
INTRAMUSCULAR | Status: AC
  Filled 2021-10-28: qty 2

## 2021-10-28 MED ORDER — METOCLOPRAMIDE HCL 5 MG/ML IJ SOLN
5.0000 mg | Freq: Three times a day (TID) | INTRAMUSCULAR | Status: DC | PRN
  Administered 2021-10-30: 13:00:00 10 mg via INTRAVENOUS
  Filled 2021-10-28: qty 2

## 2021-10-28 MED ORDER — DOCUSATE SODIUM 100 MG PO CAPS
ORAL_CAPSULE | ORAL | Status: AC
  Filled 2021-10-28: qty 1

## 2021-10-28 MED ORDER — DIPHENHYDRAMINE HCL 12.5 MG/5ML PO ELIX
12.5000 mg | ORAL_SOLUTION | ORAL | Status: DC | PRN
  Filled 2021-10-28: qty 10

## 2021-10-28 MED ORDER — OXYCODONE HCL 5 MG PO TABS: 5.0000 mg | ORAL_TABLET | Freq: Once | ORAL | Status: AC

## 2021-10-28 MED ORDER — HYDROMORPHONE HCL 1 MG/ML IJ SOLN
0.5000 mg | INTRAMUSCULAR | Status: DC | PRN
  Administered 2021-10-28 – 2021-10-29 (×5): 0.5 mg via INTRAVENOUS

## 2021-10-28 MED ORDER — ACETAMINOPHEN 325 MG PO TABS: 325.0000 mg | ORAL_TABLET | Freq: Four times a day (QID) | ORAL | Status: DC | PRN

## 2021-10-28 MED ORDER — LORATADINE 10 MG PO TABS
10.0000 mg | ORAL_TABLET | Freq: Every evening | ORAL | Status: DC
  Administered 2021-10-28 – 2021-10-30 (×3): 10 mg via ORAL
  Filled 2021-10-28 (×4): qty 1

## 2021-10-28 MED ORDER — BISACODYL 10 MG RE SUPP
10.0000 mg | Freq: Every day | RECTAL | Status: DC | PRN
  Filled 2021-10-28: qty 1

## 2021-10-28 MED ORDER — TRAMADOL HCL 50 MG PO TABS
50.0000 mg | ORAL_TABLET | Freq: Four times a day (QID) | ORAL | Status: DC | PRN
  Administered 2021-10-29 – 2021-10-30 (×2): 50 mg via ORAL
  Filled 2021-10-28: qty 1

## 2021-10-28 MED ORDER — SODIUM CHLORIDE 0.9 % IR SOLN
Status: DC | PRN
  Administered 2021-10-28: 3000 mL

## 2021-10-28 MED ORDER — TIZANIDINE HCL 2 MG PO TABS
2.0000 mg | ORAL_TABLET | Freq: Every evening | ORAL | Status: DC | PRN
  Filled 2021-10-28: qty 1

## 2021-10-28 MED ORDER — LEVOCETIRIZINE DIHYDROCHLORIDE 5 MG PO TABS: 5.0000 mg | ORAL_TABLET | Freq: Every evening | ORAL | Status: DC

## 2021-10-28 MED ORDER — FENTANYL CITRATE (PF) 100 MCG/2ML IJ SOLN
25.0000 ug | INTRAMUSCULAR | Status: AC | PRN
  Administered 2021-10-28: 14:00:00 25 ug via INTRAVENOUS
  Administered 2021-10-28: 14:00:00 50 ug via INTRAVENOUS
  Administered 2021-10-28 (×2): 25 ug via INTRAVENOUS

## 2021-10-28 MED ORDER — ACETAMINOPHEN 10 MG/ML IV SOLN
INTRAVENOUS | Status: AC
  Filled 2021-10-28: qty 100

## 2021-10-28 MED ORDER — TRANEXAMIC ACID 1000 MG/10ML IV SOLN
INTRAVENOUS | Status: AC
  Filled 2021-10-28: qty 10

## 2021-10-28 MED ORDER — GABAPENTIN 300 MG PO CAPS
ORAL_CAPSULE | ORAL | Status: AC
  Administered 2021-10-28: 21:00:00 300 mg via ORAL
  Filled 2021-10-28: qty 1

## 2021-10-28 MED ORDER — OXYCODONE HCL 5 MG PO TABS
ORAL_TABLET | ORAL | Status: AC
  Administered 2021-10-28: 13:00:00 5 mg via ORAL
  Filled 2021-10-28: qty 1

## 2021-10-28 MED ORDER — SUCRALFATE 1 G PO TABS
1.0000 g | ORAL_TABLET | Freq: Three times a day (TID) | ORAL | Status: DC
Start: 2021-10-28 — End: 2021-10-31
  Administered 2021-10-28 – 2021-10-31 (×7): 1 g via ORAL
  Filled 2021-10-28 (×9): qty 1

## 2021-10-28 MED ORDER — LOSARTAN POTASSIUM 50 MG PO TABS
50.0000 mg | ORAL_TABLET | ORAL | Status: DC
  Administered 2021-10-29 – 2021-10-31 (×2): 50 mg via ORAL
  Filled 2021-10-28 (×3): qty 1

## 2021-10-28 MED ORDER — ONDANSETRON HCL 4 MG/2ML IJ SOLN
4.0000 mg | Freq: Four times a day (QID) | INTRAMUSCULAR | Status: DC | PRN
  Administered 2021-10-30: 12:00:00 4 mg via INTRAVENOUS
  Filled 2021-10-28 (×2): qty 2

## 2021-10-28 MED ORDER — EZETIMIBE 10 MG PO TABS
10.0000 mg | ORAL_TABLET | ORAL | Status: DC
  Administered 2021-10-29 – 2021-10-31 (×2): 10 mg via ORAL
  Filled 2021-10-28 (×3): qty 1

## 2021-10-28 MED ORDER — CEFAZOLIN SODIUM-DEXTROSE 2-4 GM/100ML-% IV SOLN
2.0000 g | INTRAVENOUS | Status: AC
  Administered 2021-10-28: 10:00:00 2 g via INTRAVENOUS

## 2021-10-28 MED ORDER — GLIMEPIRIDE 2 MG PO TABS
2.0000 mg | ORAL_TABLET | Freq: Two times a day (BID) | ORAL | Status: DC
  Administered 2021-10-28 – 2021-10-31 (×5): 2 mg via ORAL
  Filled 2021-10-28 (×7): qty 1

## 2021-10-28 MED ORDER — ONDANSETRON HCL 4 MG/2ML IJ SOLN: 4.0000 mg | Freq: Once | INTRAMUSCULAR | Status: DC | PRN

## 2021-10-28 MED ORDER — PIOGLITAZONE HCL 15 MG PO TABS
15.0000 mg | ORAL_TABLET | Freq: Every day | ORAL | Status: DC
  Administered 2021-10-29 – 2021-10-31 (×3): 15 mg via ORAL
  Filled 2021-10-28 (×3): qty 1

## 2021-10-28 MED ORDER — INSULIN ASPART 100 UNIT/ML IJ SOLN
INTRAMUSCULAR | Status: AC
  Filled 2021-10-28: qty 1

## 2021-10-28 MED ORDER — ACETAMINOPHEN 10 MG/ML IV SOLN
INTRAVENOUS | Status: DC | PRN
  Administered 2021-10-28: 1000 mg via INTRAVENOUS

## 2021-10-28 MED ORDER — MIDAZOLAM HCL 5 MG/5ML IJ SOLN
INTRAMUSCULAR | Status: DC | PRN
  Administered 2021-10-28: 2 mg via INTRAVENOUS

## 2021-10-28 MED ORDER — KETOROLAC TROMETHAMINE 15 MG/ML IJ SOLN: 15.0000 mg | Freq: Four times a day (QID) | INTRAMUSCULAR | Status: AC

## 2021-10-28 MED ORDER — MEPERIDINE HCL 25 MG/ML IJ SOLN
6.2500 mg | INTRAMUSCULAR | Status: DC | PRN
Start: 2021-10-28 — End: 2021-10-28

## 2021-10-28 MED ORDER — BUPIVACAINE LIPOSOME 1.3 % IJ SUSP
INTRAMUSCULAR | Status: AC
  Filled 2021-10-28: qty 20

## 2021-10-28 MED ORDER — CEFAZOLIN SODIUM-DEXTROSE 2-4 GM/100ML-% IV SOLN
INTRAVENOUS | Status: AC
  Administered 2021-10-28: 21:00:00 2 g via INTRAVENOUS
  Filled 2021-10-28: qty 100

## 2021-10-28 MED ORDER — DOCUSATE SODIUM 100 MG PO CAPS
100.0000 mg | ORAL_CAPSULE | Freq: Two times a day (BID) | ORAL | Status: DC
  Administered 2021-10-28 – 2021-10-31 (×5): 100 mg via ORAL
  Filled 2021-10-28 (×4): qty 1

## 2021-10-28 MED ORDER — EPHEDRINE SULFATE 50 MG/ML IJ SOLN
INTRAMUSCULAR | Status: DC | PRN
  Administered 2021-10-28: 5 mg via INTRAVENOUS

## 2021-10-28 MED ORDER — KETOTIFEN FUMARATE 0.025 % OP SOLN
1.0000 [drp] | Freq: Two times a day (BID) | OPHTHALMIC | Status: DC
  Administered 2021-10-30 – 2021-10-31 (×2): 1 [drp] via OPHTHALMIC
  Filled 2021-10-28: qty 5

## 2021-10-28 MED ORDER — BUPIVACAINE-EPINEPHRINE (PF) 0.5% -1:200000 IJ SOLN
INTRAMUSCULAR | Status: AC
  Filled 2021-10-28: qty 30

## 2021-10-28 MED ORDER — CEFAZOLIN SODIUM-DEXTROSE 2-4 GM/100ML-% IV SOLN
INTRAVENOUS | Status: AC
  Administered 2021-10-28: 15:00:00 2 g via INTRAVENOUS
  Filled 2021-10-28: qty 100

## 2021-10-28 MED ORDER — ORAL CARE MOUTH RINSE: 15.0000 mL | Freq: Once | OROMUCOSAL | Status: AC

## 2021-10-28 MED ORDER — PANTOPRAZOLE SODIUM 40 MG PO TBEC
40.0000 mg | DELAYED_RELEASE_TABLET | Freq: Two times a day (BID) | ORAL | Status: DC
  Administered 2021-10-28 – 2021-10-31 (×6): 40 mg via ORAL
  Filled 2021-10-28 (×7): qty 1

## 2021-10-28 MED ORDER — DOCUSATE SODIUM 100 MG PO CAPS
ORAL_CAPSULE | ORAL | Status: AC
  Administered 2021-10-28: 21:00:00 100 mg via ORAL
  Filled 2021-10-28: qty 1

## 2021-10-28 SURGICAL SUPPLY — 60 items
APL PRP STRL LF DISP 70% ISPRP (MISCELLANEOUS) ×2
BLADE SAGITTAL WIDE XTHICK NO (BLADE) ×3 IMPLANT
BLADE SURG SZ20 CARB STEEL (BLADE) ×3 IMPLANT
CHLORAPREP W/TINT 26 (MISCELLANEOUS) ×5 IMPLANT
DRAPE 3/4 80X56 (DRAPES) ×3 IMPLANT
DRAPE IMP U-DRAPE 54X76 (DRAPES) IMPLANT
DRAPE INCISE IOBAN 66X60 STRL (DRAPES) ×3 IMPLANT
DRAPE SURG 17X11 SM STRL (DRAPES) ×4 IMPLANT
DRSG MEPILEX SACRM 8.7X9.8 (GAUZE/BANDAGES/DRESSINGS) ×3 IMPLANT
DRSG OPSITE POSTOP 4X10 (GAUZE/BANDAGES/DRESSINGS) ×3 IMPLANT
ELECT CAUTERY BLADE 6.4 (BLADE) ×3 IMPLANT
GAUZE 4X4 16PLY ~~LOC~~+RFID DBL (SPONGE) ×3 IMPLANT
GAUZE XEROFORM 1X8 LF (GAUZE/BANDAGES/DRESSINGS) ×3 IMPLANT
GLOVE SRG 8 PF TXTR STRL LF DI (GLOVE) ×1 IMPLANT
GLOVE SURG ENC MOIS LTX SZ7.5 (GLOVE) ×12 IMPLANT
GLOVE SURG ENC MOIS LTX SZ8 (GLOVE) ×12 IMPLANT
GLOVE SURG UNDER LTX SZ8 (GLOVE) ×3 IMPLANT
GLOVE SURG UNDER POLY LF SZ8 (GLOVE) ×3
GOWN STRL REUS W/ TWL LRG LVL3 (GOWN DISPOSABLE) ×1 IMPLANT
GOWN STRL REUS W/ TWL XL LVL3 (GOWN DISPOSABLE) ×1 IMPLANT
GOWN STRL REUS W/TWL LRG LVL3 (GOWN DISPOSABLE) ×3
GOWN STRL REUS W/TWL XL LVL3 (GOWN DISPOSABLE) ×3
HANDLE YANKAUER SUCT BULB TIP (MISCELLANEOUS) ×2 IMPLANT
HEAD CERAMIC BIOLOX 36 T1 +3 (Head) ×2 IMPLANT
HOLSTER ELECTROSUGICAL PENCIL (MISCELLANEOUS) ×1 IMPLANT
IV NS IRRIG 3000ML ARTHROMATIC (IV SOLUTION) ×3 IMPLANT
KIT TURNOVER KIT A (KITS) ×3 IMPLANT
LINER ACE G7 36 HIGH WALL (Liner) ×2 IMPLANT
MANIFOLD NEPTUNE II (INSTRUMENTS) ×5 IMPLANT
MAT ABSORB  FLUID 56X50 GRAY (MISCELLANEOUS) ×2
MAT ABSORB FLUID 56X50 GRAY (MISCELLANEOUS) ×1 IMPLANT
NDL FILTER BLUNT 18X1 1/2 (NEEDLE) ×1 IMPLANT
NDL SAFETY ECLIPSE 18X1.5 (NEEDLE) ×2 IMPLANT
NDL SPNL 20GX3.5 QUINCKE YW (NEEDLE) ×1 IMPLANT
NEEDLE FILTER BLUNT 18X 1/2SAF (NEEDLE)
NEEDLE FILTER BLUNT 18X1 1/2 (NEEDLE) IMPLANT
NEEDLE HYPO 18GX1.5 SHARP (NEEDLE)
NEEDLE SPNL 20GX3.5 QUINCKE YW (NEEDLE) ×3 IMPLANT
NS IRRIG 500ML POUR BTL (IV SOLUTION) ×2 IMPLANT
PACK HIP PROSTHESIS (MISCELLANEOUS) ×1 IMPLANT
PACK TOTAL KNEE (MISCELLANEOUS) ×2 IMPLANT
PENCIL SMOKE EVACUATOR (MISCELLANEOUS) ×3 IMPLANT
PIN STEINMAN 3/16 (PIN) ×3 IMPLANT
PULSAVAC PLUS IRRIG FAN TIP (DISPOSABLE) ×3
SHELL ACETABULAR 3H G7 (Shell) ×2 IMPLANT
SPONGE T-LAP 18X18 ~~LOC~~+RFID (SPONGE) ×13 IMPLANT
STAPLER SKIN PROX 35W (STAPLE) ×3 IMPLANT
STEM FEMORAL SZ12 140MM (Stem) ×2 IMPLANT
SUT TICRON 2-0 30IN 311381 (SUTURE) ×11 IMPLANT
SUT VIC AB 0 CT1 36 (SUTURE) ×3 IMPLANT
SUT VIC AB 1 CT1 36 (SUTURE) ×3 IMPLANT
SUT VIC AB 2-0 CT1 (SUTURE) ×7 IMPLANT
SYR 10ML LL (SYRINGE) ×3 IMPLANT
SYR 20ML LL LF (SYRINGE) ×3 IMPLANT
SYR 30ML LL (SYRINGE) ×6 IMPLANT
TAPE TRANSPORE STRL 2 31045 (GAUZE/BANDAGES/DRESSINGS) ×3 IMPLANT
TIP FAN IRRIG PULSAVAC PLUS (DISPOSABLE) ×1 IMPLANT
TRAP FLUID SMOKE EVACUATOR (MISCELLANEOUS) ×1 IMPLANT
WATER STERILE IRR 1000ML POUR (IV SOLUTION) ×3 IMPLANT
WATER STERILE IRR 500ML POUR (IV SOLUTION) ×1 IMPLANT

## 2021-10-28 NOTE — Anesthesia Postprocedure Evaluation (Signed)
Anesthesia Post Note  Patient: Jesse Alvarado  Procedure(s) Performed: TOTAL HIP ARTHROPLASTY (Left: Hip)  Patient location during evaluation: PACU Anesthesia Type: Spinal Level of consciousness: awake and alert, awake and oriented Pain management: pain level controlled Vital Signs Assessment: post-procedure vital signs reviewed and stable Respiratory status: spontaneous breathing, nonlabored ventilation and respiratory function stable Cardiovascular status: blood pressure returned to baseline and stable Postop Assessment: no apparent nausea or vomiting Anesthetic complications: no   No notable events documented.   Last Vitals:  Vitals:   10/28/21 1330 10/28/21 1345  BP: 112/69 102/70  Pulse: 70 73  Resp: 13 15  Temp:    SpO2: 100% 99%    Last Pain:  Vitals:   10/28/21 1345  TempSrc:   PainSc: 5                  Phill Mutter

## 2021-10-28 NOTE — Anesthesia Preprocedure Evaluation (Signed)
Anesthesia Evaluation    History of Anesthesia Complications (+) history of anesthetic complications  Airway Mallampati: II       Dental  (+) Poor Dentition, Caps, Dental Advidsory Given, Missing   Pulmonary neg shortness of breath, asthma , sleep apnea and Continuous Positive Airway Pressure Ventilation , pneumonia, resolved, neg COPD, neg recent URI,    Pulmonary exam normal        Cardiovascular Exercise Tolerance: Good hypertension, On Medications and Pt. on medications (-) angina(-) CAD, (-) Past MI, (-) Cardiac Stents and (-) CABG (-) dysrhythmias (-) Valvular Problems/Murmurs Rhythm:regular Rate:Normal     Neuro/Psych  Neuromuscular disease    GI/Hepatic GERD  Medicated,NAFLD   Endo/Other  diabetes, Well Controlled, Oral Hypoglycemic Agents  Renal/GU Renal disease (kidney stones)     Musculoskeletal  (+) Arthritis , Osteoarthritis,    Abdominal   Peds  Hematology  (+) Blood dyscrasia, anemia ,   Anesthesia Other Findings  Anemia   Barrett's esophagus without dysplasia 01/19/2019   Diabetes mellitus type 2, uncomplicated (CMS-HCC)  diagnosed 2005   Erectile dysfunction   FH: colon cancer 01/19/2019   FH: colon polyps 01/19/2019   GERD (gastroesophageal reflux disease)   Hyperlipidemia   Hypertension   Pancreatitis   Right shoulder injury 2010  with rupture of biceps tendon    Reproductive/Obstetrics                             Anesthesia Physical  Anesthesia Plan  ASA: 3  Anesthesia Plan: Spinal   Post-op Pain Management:    Induction: Intravenous  PONV Risk Score and Plan: 2 and Propofol infusion and TIVA  Airway Management Planned: Natural Airway and Mask  Additional Equipment:   Intra-op Plan:   Post-operative Plan: Extubation in OR  Informed Consent: I have reviewed the patients History and Physical, chart, labs and discussed the procedure including  the risks, benefits and alternatives for the proposed anesthesia with the patient or authorized representative who has indicated his/her understanding and acceptance.       Plan Discussed with: CRNA, Anesthesiologist and Surgeon  Anesthesia Plan Comments:         Anesthesia Quick Evaluation

## 2021-10-28 NOTE — Progress Notes (Signed)
Bladder scanned patient, 332mL of urine in bladder. In and out cath performed by this RN and Vicente Males, NT. 451mL of yellow urine.

## 2021-10-28 NOTE — Op Note (Signed)
10/28/2021  11:22 AM  Patient:   Jesse Alvarado  Pre-Op Diagnosis:   Degenerative joint disease, left hip.  Post-Op Diagnosis:   Same.  Procedure:   Left total hip arthroplasty.  Surgeon:   Pascal Lux, MD  Assistant:   Cameron Proud, PA-C  Anesthesia:   Spinal  Findings:   As above.  Complications:   None  EBL:   150 cc  Fluids:   700 cc crystalloid  UOP:   None  TT:   None  Drains:   None  Closure:   Staples  Implants:   Biomet press-fit system with a #12 laterally offset Echo femoral stem, a 52 mm acetabular shell with an E-poly hi-wall liner, and a 36 mm ceramic head with a +3 mm neck.  Brief Clinical Note:   The patient is a 55 year old male with a history progressively worsening anterior left hip pain. His symptoms have progressed despite medications, activity modification, etc. His history and examination are consistent with degenerative joint disease, confirmed by plain radiographs. The patient presents at this time for a left total hip arthroplasty.   Procedure:   The patient was brought into the operating room. After adequate spinal anesthesia was obtained, the patient was repositioned in the right lateral decubitus position and secured using a lateral hip positioner. The left hip and lower extremity were prepped with ChloroPrep solution before being draped sterilely. Preoperative antibiotics were administered. A timeout was performed to verify the appropriate surgical site.    A standard posterior approach to the hip was made through an approximately 4-5 inch incision. The incision was carried down through the subcutaneous tissues to expose the gluteal fascia and proximal end of the iliotibial band. These structures were split the length of the incision and the Charnley self-retaining hip retractor placed. The bursal tissues were swept posteriorly to expose the short external rotators. The anterior border of the piriformis tendon was identified and this plane  developed down through the capsule to enter the joint. A flap of tissue was elevated off the posterior aspect of the femoral neck and greater trochanter and retracted posteriorly. This flap included the piriformis tendon, the short external rotators, and the posterior capsule. The soft tissues were elevated off the lateral aspect of the ilium and a large Steinmann pin placed bicortically.   With the left leg aligned over the right, a drill bit was placed into the greater trochanter parallel to the Steinmann pin and the distance between these two pins measured in order to optimize leg lengths postoperatively. The drill bit was removed and the hip dislocated. The piriformis fossa was debrided of soft tissues before the intramedullary canal was accessed through this point using a triple step reamer. The canal was reamed sequentially beginning with a #7 tapered reamer and progressing to a #12 tapered reamer. This provided excellent circumferential chatter. Using the appropriate guide, a femoral neck cut was made 10-12 mm above the lesser trochanter. The femoral head was removed.  Attention was directed to the acetabular side. The labrum was debrided circumferentially before the ligamentum teres was removed using a large curette. A line was drawn on the drapes corresponding to the native version of the acetabulum. This line was used as a guide while the acetabulum was reamed sequentially beginning with a 45 mm reamer and progressing to a 51 mm reamer. This provided excellent circumferential chatter. The 51 mm trial acetabulum was positioned and found to fit quite well. Therefore, the 52 mm acetabular shell  was selected and impacted into place with care taken to maintain the appropriate version. The trial high wall liner was inserted.  Attention was redirected to the femoral side. A box osteotome was used to establish version before the canal was broached sequentially beginning with a #7 broach and progressing to a  #12 broach. This was left in place and several trial reductions performed using both a standard and laterally offset neck options, as well as the -6 mm, +0 mm, and +3 mm neck lengths. After removing the trial components, the "manhole cover" was placed into the apex of the acetabular shell and tightened securely. The permanent E-polyethylene hi-wall liner was impacted into the acetabular shell and its locking mechanism verified using a quarter-inch osteotome. Next, the #12 laterally offset femoral stem was impacted into place with care taken to maintain the appropriate version. A repeat trial reduction was performed using the +0 mm and +3 mm neck lengths. The +3 mm neck length demonstrated excellent stability both in extension and external rotation as well as with flexion to 90 and internal rotation beyond 70. It also was stable in the position of sleep. In addition, leg lengths appeared to be restored appropriately, both by reassessing the position of the right leg over the left, as well as by measuring the distance between the Steinmann pin and the drill bit. The 36 mm ceramic head with the +3 mm neck was impacted onto the stem of the femoral component. The Morse taper locking mechanism was verified using manual distraction before the head was relocated and placed through a range of motion with the findings as described above.  The wound was copiously irrigated with sterile saline solution via the jet lavage system before the peri-incisional and pericapsular tissues were injected with 30 cc of 0.5% Sensorcaine with epinephrine and 20 cc of Exparel diluted out to 60 cc with normal saline to help with postoperative analgesia. The posterior flap was reapproximated to the posterior aspect of the greater trochanter using #2 Tycron interrupted sutures placed through drill holes. Several additional #2 Tycron interrupted sutures were used to reinforce this layer of closure. The iliotibial band was reapproximated using  #1 Vicryl interrupted sutures before the gluteal fascia was closed using a running #1 Vicryl suture. At this point, 1 g of transexemic acid in 10 cc of normal saline was injected into the joint to help reduce postoperative bleeding. The subcutaneous tissues were closed in several layers using 2-0 Vicryl interrupted sutures before the skin was closed using staples. A sterile occlusive dressing was applied to the wound. The patient was then rolled back into the supine position on his/her hospital bed before being awakened and returned to the recovery room in satisfactory condition after tolerating the procedure well.

## 2021-10-28 NOTE — Anesthesia Procedure Notes (Signed)
Spinal  Patient location during procedure: OR Start time: 10/28/2021 9:15 AM End time: 10/28/2021 9:16 AM Reason for block: surgical anesthesia Staffing Performed: resident/CRNA  Anesthesiologist: Phill Mutter, MD Resident/CRNA: Aline Brochure, CRNA Preanesthetic Checklist Completed: patient identified, IV checked, site marked, risks and benefits discussed, surgical consent, monitors and equipment checked, pre-op evaluation and timeout performed Spinal Block Patient position: sitting Prep: ChloraPrep Patient monitoring: heart rate, continuous pulse ox, blood pressure and cardiac monitor Approach: midline Location: L3-4 Injection technique: single-shot Needle Needle type: Introducer and Pencan  Needle gauge: 24 G Needle length: 9 cm Assessment Sensory level: T10 Events: CSF return Additional Notes Negative paresthesia. Negative blood return. Positive free-flowing CSF. Expiration date of kit checked and confirmed. Patient tolerated procedure well, without complications.

## 2021-10-28 NOTE — H&P (Signed)
History of Present Illness:  Jesse Alvarado is a 55 y.o. male that presents to clinic today for his preoperative history and evaluation. Patient presents unaccompanied. The patient is scheduled to undergo a left total hip arthroplasty on 10/28/21 by Dr. Roland Rack. His pain began several years ago. The pain is located in the right hip. He describes his pain as moderate, constant, and 5 out of 10. Also described as throbbing, aching, and stabbing. Symptoms are aggravated by standing, walking, and daily activities. He denies associated numbness or tingling. The patient's symptoms have progressed to the point that they decrease his quality of life. The patient has previously undergone conservative treatment including Tylenol and tramadol and activity modification without adequate control of his symptoms.  Patient has not previously had lumbar surgery, but does note that he has an L6 vertebrae. Denies history of blood clots. Patient does see cardiology for atherosclerosis.  Patient is diabetic. Last A1c was 8.1 on 10/15/2021. Fructosamine was 271 on 10/21/21.  Past Medical History:   Anemia   Barrett's esophagus without dysplasia 01/19/2019   Diabetes mellitus type 2, uncomplicated (CMS-HCC) - diagnosed 2005   Erectile dysfunction   FH: colon cancer 01/19/2019   FH: colon polyps 01/19/2019   GERD (gastroesophageal reflux disease)   Hyperlipidemia   Hypertension   Pancreatitis   Right shoulder injury 2010 (with rupture of biceps tendon)   Past Surgical History:   COLONOSCOPY 05/10/2014 [facility] (Brother), FH Colon Polyps (Father): CBF 05/2019: Recall ltr mailed)   COLONOSCOPY 08/30/2019 (Diverticulosis/FHx CC-Brother/Repeat 96yr/TKT)   EGD 12/27/2015 (Barrett's Esophagus: CBF 12/2018: Recall ltr mailed)   EGD 08/30/2019 (Barrett's Esophagus/Repeat 367yrTKT)   Excision, right prepatellar bursa and ostectomy, Osgood-Schlatter lesion. Right 10/02/2014 (Dr. MeRudene Christians  KNEE ARTHROSCOPY Right 11/18/2018  RIGHT KNEE  ARTHROSCOPY, MEDIAL MENISCUS REPAIR (HORIZONTAL TEAR OF POSTERIOR HORN), RIGHT KNEE CHONDROPLASTY OF PATELLA AND MEDIAL FEMORAL CONDYLE   knee surgery (10/11/2020)   Right shoulder surgery for ruptured biceps tendon 204665 UMBILICAL HERNIA REPAIR   Current Medications:   albuterol (PROAIR RESPICLICK) 90 mcg/actuation inhaler Inhale 1 inhalation into the lungs once daily as needed   ascorbic acid, vitamin C, (VITAMIN C) 1000 MG tablet Take 1,000 mg by mouth once daily   atorvastatin (LIPITOR) 80 MG tablet Take 80 mg by mouth once   azelastine (OPTIVAR) 0.05 % ophthalmic solution Place 1 drop into both eyes 2 (two) times daily as needed   azelastine-fluticasone 137-50 mcg/spray Spry Place into one nostril 2 (two) times daily   benzonatate (TESSALON) 200 MG capsule Take 200 mg by mouth 3 (three) times daily as needed for Cough   blood glucose diagnostic test strip Use once daily Use as instructed.   calcium citrate (CALCITRATE) 200 mg (950 mg) tablet Take by mouth   calcium citrate-vitamin D3 (CITRACAL+D) 315-200 mg-unit tablet Take 1 tablet by mouth 3 (three) times daily with meals   ciclopirox (PENLAC) 8 % topical nail solution Apply topically nightly 6.6 mL 3   cyanocobalamin (VITAMIN B12) 1,000 mcg/mL injection Inject 1 mL (1,000 mcg total) into the muscle monthly 1 mL 11   dapagliflozin (FARXIGA) 10 mg tablet Take 1 tablet (10 mg total) by mouth every morning 90 tablet 2   ezetimibe (ZETIA) 10 mg tablet Take by mouth   fenofibrate nanocrystallized (TRICOR) 145 MG tablet Take 1 tablet (145 mg total) by mouth once daily 90 tablet 4   ferrous gluconate (FERGON) 324 MG tablet TAKE ONE TABLET BY MOUTH THREE TIMES A DAY  WITH FOOD 294 tablet 1   folic acid (FOLVITE) 1 MG tablet Take 1 tablet (1 mg total) by mouth once daily 90 tablet 1   FREESTYLE LIBRE 2 SENSOR kit Use 1 kit every 14 (fourteen) days for glucose monitoring 2 kit 12   gabapentin (NEURONTIN) 300 MG capsule Take 300 mg by mouth    glimepiride (AMARYL) 2 MG tablet Take 1 tablet (2 mg total) by mouth 2 (two) times daily 180 tablet 4   HYDROcodone-acetaminophen (NORCO) 5-325 mg tablet Take one tablet at night for pain; may take up to every 6 hours as needed for pain if not working or driving 20 tablet 0   ipratropium (ATROVENT) 0.06 % nasal spray Place 2 sprays into both nostrils 2 (two) times daily 3   ketoconazole (NIZORAL) 2 % shampoo ketoconazole 2 % shampoo   lancets Use 1 each once daily Use as instructed.   levocetirizine (XYZAL) 5 MG tablet Take 5 mg by mouth every evening   losartan (COZAAR) 50 MG tablet Take 1 tablet (50 mg total) by mouth once daily 90 tablet 2   meloxicam (MOBIC) 15 MG tablet TAKE ONE TABLET BY MOUTH ONE TIME DAILY 30 tablet 1   metFORMIN (GLUCOPHAGE) 850 MG tablet Take 1 tablet (850 mg total) by mouth 3 (three) times daily 270 tablet 4   pantoprazole (PROTONIX) 40 MG DR tablet Take 1 tablet (40 mg total) by mouth 2 (two) times daily 180 tablet 1   pioglitazone (ACTOS) 30 MG tablet Take 1 tablet (30 mg total) by mouth once daily 90 tablet 4   silodosin 4 mg Cap TAKE ONE CAPSULE BY MOUTH ONE TIME DAILY 30 capsule 6   sucralfate (CARAFATE) 1 gram tablet Take 1 tablet (1 g total) by mouth 4 (four) times daily before meals and nightly 360 tablet 1   syringe with needle (SYRINGE 3CC/25GX1") 3 mL 25 gauge x 1" Syrg Use 1 each monthly. Dx E53.8 100 Syringe 1   tadalafiL (CIALIS) 5 MG tablet Take 5 mg by mouth once daily   tiZANidine (ZANAFLEX) 2 MG tablet Take 1 tablet (2 mg total) by mouth 3 (three) times daily as needed May take two at at time if needed. 60 tablet 0   traMADoL (ULTRAM) 50 mg tablet TAKE 1 TO 2 TABLETS BY MOUTH THREE TIMES DAILY AS NEEDED 90 tablet 0   VASCEPA 1 gram Cap Take 2 g by mouth 2 (two) times daily   Allergies:   Ciprocinonide Other (GI upset)   Ciprofloxacin Other (GI upset)   Monosodium Glutamate Diarrhea   Trulicity [Dulaglutide] Other (Caused pancreatitis)   Social  History:   Socioeconomic History:   Marital status: Married  Occupational History   Occupation: Counsellor  Tobacco Use   Smoking status: Never   Smokeless tobacco: Never  Vaping Use   Vaping Use: Never used  Substance and Sexual Activity   Alcohol use: No  Alcohol/week: 0.0 standard drinks   Drug use: No   Sexual activity: Yes  Partners: Female   Family History:   Cancer Brother   Colon cancer Brother   Lung cancer Brother   Skin cancer Father   No Known Problems Mother   Asthma Other   Diabetes type II Other   Review of Systems:  A 10+ ROS was performed, reviewed, and the pertinent orthopaedic findings are documented in the HPI.   Physical Examination:  BP 120/82 (BP Location: Left upper arm, Patient Position: Sitting, BP Cuff Size: Adult)  Ht 175.3 cm (_0 )   Wt 83.6 kg (184 lb 3.2 oz)   BMI 27.20 kg/m   Patient is a well-developed, well-nourished male in no acute distress. Patient has normal mood and affect. Patient is alert and oriented to person, place, and time.   HEENT: Atraumatic, normocephalic. Pupils equal and reactive to light. Extraocular motion intact. Noninjected sclera.  Cardiovascular: Regular rate and rhythm, with no murmurs, rubs, or gallops. Distal pulses palpable.  Respiratory: Lungs clear to auscultation bilaterally.   Left hip exam: He has moderate pain with left hip internal rotation and external rotation. He exhibits flexion to 110 degrees, internal rotation to 20 degrees, and external rotation to 40 degrees on the left. He is neurovascularly intact to both lower extremities. He has negative sitting straight leg raises bilaterally.   Sensation is intact over the saphenous, lateral cutaneous, superficial fibular, and deep fibular nerve distributions.  X-rays: No new radiographs were obtained today. Previous radiographs were reviewed of the left hip and revealed significant central narrowing of the femoral acetabular joint space.  Mild narrowing noted of the superior femoral acetabular joint space. Mild femoral head osteophyte formation noted. No fractures or dislocations. No other osseous abnormalities.  Impression:  1. Primary osteoarthritis of left hip.  Plan: The treatment options were discussed with the patient and his wife. In addition, patient educational materials were provided regarding the diagnosis and treatment options. The patient is quite frustrated by his symptoms and functional limitations, and is ready to consider more aggressive treatment options. Although his x-ray findings are underwhelming for severe degenerative joint disease, his physical examination findings are consistent with a pathologic process involving the hip. Given that Dr. Posey Pronto, our hip arthroscopist, did not feel it was necessary or advisable to proceed with an MRI scan to see if he might benefit from arthroscopic procedure to treat a labral tear, presumably given his age and degree of degenerative changes noted on x-ray, I have recommended that we proceed with surgical intervention, specifically a left total hip arthroplasty. The procedure was discussed with the patient, as were the potential risks (including bleeding, infection, nerve and/or blood vessel injury, persistent or recurrent pain, loosening and/or failure of the components, dislocation, leg length inequality, need for further surgery, blood clots, strokes, heart attacks and/or arhythmias, pneumonia, etc.) and benefits. The patient states his/her understanding and wishes to proceed. All of the patient's questions and concerns were answered. He can call any time with further concerns. He will return to work without restrictions. He will follow up post-surgery, routine.   H&P reviewed and patient re-examined. No changes.

## 2021-10-28 NOTE — Progress Notes (Signed)
Patient reported to day shift nurse at shift change that when PT worked with him he felts a pop when he was getting back into bed. PT felt it was his knee. Patient since then patient is having a pain and sensation going from the hip down his leg. Patient looks to be in pain, he is shaking and tense, grimacing. Administered pain medication. Poggi verbal order for an AP & lateral view x-ray of the hip. X-ray at bedside now.   Pain medication given before x-ray arrived. Patient screaming out in pain. Administered more pain medication

## 2021-10-28 NOTE — Evaluation (Signed)
Physical Therapy Evaluation Patient Details Name: Jesse Alvarado MRN: [id number] DOB: Jan 01, 1966 Today's Date: 10/28/2021  History of Present Illness  Patient is a 55 year old male with degenerative joint disease of left hip. Patient is s/p left total hip arthroplasty. Past medical history of sleep apnea, diabetes, arthritis, anemia, right shoulder injury with ruptured biceps tendon, hypertension  Clinical Impression  Patient is agreeable to PT evaluation. He is independent at baseline without assistive device with walking but has pain with standing for prolonged periods prior to surgery . He lives with his spouse.  During evaluation, patient reports 4/10 pain in left hip that did not seem to worsen with activity. He was able to get out of bed, stand, and ambulate 4ft with rolling walker with occasional cues for safety and sequencing of BLE and rolling walker while ambulating. Reviewed mobility precautions. Recommend to continued PT to maximize independence and facilitate return to prior level of function. HHPT recommended at discharge.      Recommendations for follow up therapy are one component of a multi-disciplinary discharge planning process, led by the attending physician.  Recommendations may be updated based on patient status, additional functional criteria and insurance authorization.  Follow Up Recommendations Home health PT    Assistance Recommended at Discharge  PRN assistance   Functional Status Assessment Patient has had a recent decline in their functional status and demonstrates the ability to make significant improvements in function in a reasonable and predictable amount of time.  Equipment Recommendations  None recommended by PT    Recommendations for Other Services       Precautions / Restrictions Precautions Precautions: Posterior Hip;Fall Precaution Booklet Issued: Yes (comment) Restrictions Weight Bearing Restrictions: Yes LLE Weight Bearing: Weight bearing as  tolerated      Mobility  Bed Mobility Overal bed mobility: Needs Assistance Bed Mobility: Supine to Sit;Sit to Supine     Supine to sit: Supervision Sit to supine: Min assist   General bed mobility comments: assistance provided for LLE with return to bed. patient able to maintain hip precautions with functional activity    Transfers Overall transfer level: Needs assistance Equipment used: Rolling walker (2 wheels) Transfers: Sit to/from Stand Sit to Stand: Min guard           General transfer comment: verbal cues for hand placement. patient able to stand from bed and from recliner with no lifting assistance required. Min guard for safety.    Ambulation/Gait Ambulation/Gait assistance: Min guard Gait Distance (Feet): 20 Feet Assistive device: Rolling walker (2 wheels) Gait Pattern/deviations: Step-to pattern;Decreased stride length;Decreased stance time - left Gait velocity: decreased     General Gait Details: verbal cues for rolling walker and BLE sequencing. patient fatigued with activity. no reported increased pain with mobility  Stairs            Wheelchair Mobility    Modified Rankin (Stroke Patients Only)       Balance Overall balance assessment: Needs assistance Sitting-balance support: Feet supported Sitting balance-Leahy Scale: Good     Standing balance support: Bilateral upper extremity supported;Reliant on assistive device for balance Standing balance-Leahy Scale: Fair Standing balance comment: using rolling walker for UE support in standing                             Pertinent Vitals/Pain Pain Assessment: 0-10 Pain Score: 4  Pain Location: left hip Pain Descriptors / Indicators: Sore;Tender Pain Intervention(s): Limited activity within  patient's tolerance;Monitored during session (ice pack re-applied after session)    Home Living Family/patient expects to be discharged to:: Private residence Living Arrangements:  Spouse/significant other Available Help at Discharge: Family Type of Home: House Home Access: Stairs to enter Entrance Stairs-Rails: Left Entrance Stairs-Number of Steps: [place]: Able to live on main level with bedroom/bathroom Home Equipment: Conservation officer, nature (2 wheels);Cane - single point;Crutches      Prior Function Prior Level of Function : Independent/Modified Independent;Working/employed             Mobility Comments: independent ADLs Comments: independent     Hand Dominance        Extremity/Trunk Assessment   Upper Extremity Assessment Upper Extremity Assessment: Overall WFL for tasks assessed    Lower Extremity Assessment Lower Extremity Assessment: LLE deficits/detail (RLE WFL) LLE Deficits / Details: patient able to activate hip/knee/ankle movement in gravity elimitated position. no knee buckling with weight bearing LLE Sensation: WNL       Communication   Communication: No difficulties  Cognition Arousal/Alertness: Awake/alert Behavior During Therapy: WFL for tasks assessed/performed Overall Cognitive Status: Within Functional Limits for tasks assessed                                 General Comments: patient is able to follow all directions without difficulty        General Comments General comments (skin integrity, edema, etc.): reviewed posterior hip precuations and tips on how to maintain during functional mobility    Exercises     Assessment/Plan    PT Assessment Patient needs continued PT services  PT Problem List Decreased strength;Decreased range of motion;Decreased activity tolerance;Decreased balance;Decreased mobility;Decreased safety awareness;Pain       PT Treatment Interventions DME instruction;Gait training;Stair training;Functional mobility training;Therapeutic activities;Therapeutic exercise;Balance training;Neuromuscular re-education;Cognitive remediation;Patient/family education    PT Goals (Current  goals can be found in the Care Plan section)  Acute Rehab PT Goals Patient Stated Goal: to return home PT Goal Formulation: With patient Time For Goal Achievement: 11/11/21 Potential to Achieve Goals: Good    Frequency BID   Barriers to discharge        Co-evaluation               AM-PAC PT "6 Clicks" Mobility  Outcome Measure Help needed turning from your back to your side while in a flat bed without using bedrails?: A Little Help needed moving from lying on your back to sitting on the side of a flat bed without using bedrails?: A Little Help needed moving to and from a bed to a chair (including a wheelchair)?: A Little Help needed standing up from a chair using your arms (e.g., wheelchair or bedside chair)?: A Little Help needed to walk in hospital room?: A Little Help needed climbing 3-5 steps with a railing? : A Little 6 Click Score: 18    End of Session Equipment Utilized During Treatment: Gait belt Activity Tolerance: Patient tolerated treatment well;No increased pain Patient left: in bed;with call bell/phone within reach;with family/visitor present Nurse Communication: Mobility status PT Visit Diagnosis: Pain;Muscle weakness (generalized) (M62.81);Other abnormalities of gait and mobility (R26.89) Pain - Right/Left: Left Pain - part of body: Hip    Time: 1530-1602 PT Time Calculation (min) (ACUTE ONLY): 32 min   Charges:   PT Evaluation $PT Eval Low Complexity: 1 Low PT Treatments $Gait Training: 8-22 mins  Minna Merritts, PT, MPT   Percell Locus 10/28/2021, 4:17 PM

## 2021-10-28 NOTE — Transfer of Care (Signed)
Immediate Anesthesia Transfer of Care Note  Patient: [name]  Procedure(s) Performed: TOTAL HIP ARTHROPLASTY (Left: Hip)  Patient Location: PACU  Anesthesia Type:Spinal  Level of Consciousness: awake, alert  and oriented  Airway & Oxygen Therapy: Patient Spontanous Breathing and Patient connected to nasal cannula oxygen  Post-op Assessment: Report given to RN and Post -op Vital signs reviewed and stable  Post vital signs: Reviewed and stable  Last Vitals:  Vitals Value Taken Time  BP 101/73 10/28/21 1315  Temp 35.9 C 10/28/21 1127  Pulse 75 10/28/21 1329  Resp 14 10/28/21 1329  SpO2 100 % 10/28/21 1329  Vitals shown include unvalidated device data.  Last Pain:  Vitals:   10/28/21 1300  TempSrc:   PainSc: 3          Complications: No notable events documented.

## 2021-10-29 ENCOUNTER — Encounter: Payer: Self-pay | Admitting: Surgery

## 2021-10-29 ENCOUNTER — Ambulatory Visit: Payer: Managed Care, Other (non HMO)

## 2021-10-29 DIAGNOSIS — M1612 Unilateral primary osteoarthritis, left hip: Secondary | ICD-10-CM | POA: Diagnosis not present

## 2021-10-29 LAB — BASIC METABOLIC PANEL
Anion gap: 5 (ref 5–15)
BUN: 18 mg/dL (ref 6–20)
CO2: 25 mmol/L (ref 22–32)
Calcium: 8 mg/dL — ABNORMAL LOW (ref 8.9–10.3)
Chloride: 104 mmol/L (ref 98–111)
Creatinine, Ser: 1.33 mg/dL — ABNORMAL HIGH (ref 0.61–1.24)
GFR, Estimated: >60 mL/min (ref >60)
Glucose, Bld: 272 mg/dL — ABNORMAL HIGH (ref 70–99)
Potassium: 4.2 mmol/L (ref 3.5–5.1)
Sodium: 134 mmol/L — ABNORMAL LOW (ref 135–145)

## 2021-10-29 LAB — GLUCOSE, CAPILLARY
Glucose-Capillary: 209 mg/dL — ABNORMAL HIGH (ref 70–99)
Glucose-Capillary: 235 mg/dL — ABNORMAL HIGH (ref 70–99)
Glucose-Capillary: 202 mg/dL — ABNORMAL HIGH (ref 70–99)
Glucose-Capillary: 163 mg/dL — ABNORMAL HIGH (ref 70–99)

## 2021-10-29 LAB — CBC
HCT: 28.3 % — ABNORMAL LOW (ref 39.0–52.0)
Hemoglobin: 9.2 g/dL — ABNORMAL LOW (ref 13.0–17.0)
MCH: 29.2 pg (ref 26.0–34.0)
MCHC: 32.5 g/dL (ref 30.0–36.0)
MCV: 89.8 fL (ref 80.0–100.0)
Platelets: 238 10*3/uL (ref 150–400)
RBC: 3.15 MIL/uL — ABNORMAL LOW (ref 4.22–5.81)
RDW: 14.4 % (ref 11.5–15.5)
WBC: 8.5 10*3/uL (ref 4.0–10.5)
nRBC: 0 % (ref 0.0–0.2)

## 2021-10-29 MED ORDER — ACETAMINOPHEN 500 MG PO TABS
ORAL_TABLET | ORAL | Status: AC
  Administered 2021-10-29: 13:00:00 1000 mg via ORAL
  Filled 2021-10-29: qty 2

## 2021-10-29 MED ORDER — GABAPENTIN 300 MG PO CAPS
ORAL_CAPSULE | ORAL | Status: AC
  Administered 2021-10-29: 17:00:00 300 mg via ORAL
  Filled 2021-10-29: qty 1

## 2021-10-29 MED ORDER — CHLORHEXIDINE GLUCONATE CLOTH 2 % EX PADS
6.0000 | MEDICATED_PAD | Freq: Every day | CUTANEOUS | Status: DC
  Administered 2021-10-29 – 2021-10-31 (×2): 6 via TOPICAL

## 2021-10-29 MED ORDER — HYDROMORPHONE HCL 1 MG/ML IJ SOLN
INTRAMUSCULAR | Status: AC
  Filled 2021-10-29: qty 0.5

## 2021-10-29 MED ORDER — KETOROLAC TROMETHAMINE 15 MG/ML IJ SOLN
INTRAMUSCULAR | Status: AC
  Administered 2021-10-29: 09:00:00 15 mg via INTRAVENOUS
  Filled 2021-10-29: qty 1

## 2021-10-29 MED ORDER — OXYCODONE HCL 5 MG PO TABS
5.0000 mg | ORAL_TABLET | ORAL | 0 refills | Status: DC | PRN
Start: 2021-10-29 — End: 2022-08-12

## 2021-10-29 MED ORDER — DOCUSATE SODIUM 100 MG PO CAPS
ORAL_CAPSULE | ORAL | Status: AC
  Administered 2021-10-29: 13:00:00 100 mg via ORAL
  Filled 2021-10-29: qty 1

## 2021-10-29 MED ORDER — OXYCODONE HCL 5 MG PO TABS
ORAL_TABLET | ORAL | Status: AC
  Administered 2021-10-29: 11:00:00 5 mg via ORAL
  Filled 2021-10-29: qty 1

## 2021-10-29 MED ORDER — TRAMADOL HCL 50 MG PO TABS: 50.0000 mg | ORAL_TABLET | Freq: Four times a day (QID) | ORAL | 0 refills | Status: DC | PRN

## 2021-10-29 MED ORDER — GABAPENTIN 300 MG PO CAPS
ORAL_CAPSULE | ORAL | Status: AC
  Administered 2021-10-29: 09:00:00 300 mg via ORAL
  Filled 2021-10-29: qty 1

## 2021-10-29 MED ORDER — APIXABAN 2.5 MG PO TABS: 2.5000 mg | ORAL_TABLET | Freq: Two times a day (BID) | ORAL | 0 refills | Status: DC

## 2021-10-29 MED ORDER — OXYCODONE HCL 5 MG PO TABS
ORAL_TABLET | ORAL | Status: AC
  Filled 2021-10-29: qty 1

## 2021-10-29 MED ORDER — TRAMADOL HCL 50 MG PO TABS
ORAL_TABLET | ORAL | Status: AC
  Filled 2021-10-29: qty 1

## 2021-10-29 MED ORDER — ACETAMINOPHEN 500 MG PO TABS
ORAL_TABLET | ORAL | Status: AC
  Administered 2021-10-29: 09:00:00 1000 mg via ORAL
  Filled 2021-10-29: qty 2

## 2021-10-29 MED ORDER — INSULIN ASPART 100 UNIT/ML IJ SOLN
INTRAMUSCULAR | Status: AC
  Administered 2021-10-29: 5 [IU] via SUBCUTANEOUS
  Filled 2021-10-29: qty 1

## 2021-10-29 MED ORDER — ACETAMINOPHEN 500 MG PO TABS
ORAL_TABLET | ORAL | Status: AC
  Filled 2021-10-29: qty 2

## 2021-10-29 MED ORDER — OXYCODONE HCL 5 MG PO TABS
ORAL_TABLET | ORAL | Status: AC
  Filled 2021-10-29: qty 2

## 2021-10-29 MED ORDER — ONDANSETRON HCL 4 MG/2ML IJ SOLN
INTRAMUSCULAR | Status: AC
  Administered 2021-10-29: 15:00:00 4 mg via INTRAVENOUS
  Filled 2021-10-29: qty 2

## 2021-10-29 MED ORDER — HYDROMORPHONE HCL 1 MG/ML IJ SOLN
INTRAMUSCULAR | Status: AC
  Administered 2021-10-29: 14:00:00 0.5 mg via INTRAVENOUS
  Filled 2021-10-29: qty 0.5

## 2021-10-29 MED ORDER — HYDROMORPHONE HCL 1 MG/ML IJ SOLN
INTRAMUSCULAR | Status: AC
  Administered 2021-10-29: 17:00:00 0.5 mg via INTRAVENOUS
  Filled 2021-10-29: qty 0.5

## 2021-10-29 MED ORDER — OXYCODONE HCL 5 MG PO TABS
ORAL_TABLET | ORAL | Status: AC
  Administered 2021-10-29: 09:00:00 5 mg via ORAL
  Filled 2021-10-29: qty 1

## 2021-10-29 MED ORDER — KETOROLAC TROMETHAMINE 15 MG/ML IJ SOLN
INTRAMUSCULAR | Status: AC
  Administered 2021-10-29: 03:00:00 15 mg via INTRAVENOUS
  Filled 2021-10-29: qty 1

## 2021-10-29 MED ORDER — INSULIN ASPART 100 UNIT/ML IJ SOLN
INTRAMUSCULAR | Status: AC
  Administered 2021-10-29: 09:00:00 5 [IU] via SUBCUTANEOUS
  Filled 2021-10-29: qty 1

## 2021-10-29 MED ORDER — CEFAZOLIN SODIUM-DEXTROSE 2-4 GM/100ML-% IV SOLN
INTRAVENOUS | Status: AC
  Administered 2021-10-29: 03:00:00 2 g via INTRAVENOUS
  Filled 2021-10-29: qty 100

## 2021-10-29 MED ORDER — INSULIN ASPART 100 UNIT/ML IJ SOLN
INTRAMUSCULAR | Status: AC
  Administered 2021-10-29: 18:00:00 5 [IU] via SUBCUTANEOUS
  Filled 2021-10-29: qty 1

## 2021-10-29 MED ORDER — ACETAMINOPHEN 500 MG PO TABS: 1000.0000 mg | ORAL_TABLET | Freq: Four times a day (QID) | ORAL | 0 refills | Status: AC

## 2021-10-29 MED ORDER — KETOROLAC TROMETHAMINE 15 MG/ML IJ SOLN
INTRAMUSCULAR | Status: AC
  Administered 2021-10-29: 15 mg via INTRAVENOUS
  Filled 2021-10-29: qty 1

## 2021-10-29 NOTE — TOC Progression Note (Addendum)
Transition of Care [facility]) - Progression Note    Patient Details  Name: Jesse Alvarado MRN: [id number] Date of Birth: 07-17-1966  Transition of Care [facility]) CM/SW [place], RN Phone Number: 10/29/2021, 4:32 PM  Clinical Narrative:   Met with the patient at the bedside to discuss DC plan and needs He lives at home with his wife and father in law, He has a 3 in 36 and a rolling walker at home, he would like a reacher and shoe horn, He has had them both less than 5 years ago, I explained Insurance will only cover every 5 years, he will have his wife check on the items to order maybe from online such as [place] He has transportation and can afford his medication He is aware that he will need to go to Outpatient PT and prefers to go to [facility], his Dr is to set it up         Expected Discharge Plan and Services                                                 Social Determinants of Health (SDOH) Interventions    Readmission Risk Interventions No flowsheet data found.

## 2021-10-29 NOTE — Progress Notes (Signed)
Physical Therapy Treatment Patient Details Name: Jesse Alvarado MRN: [id number] DOB: Mar 20, 1966 Today's Date: 10/29/2021   History of Present Illness Jesse Alvarado is a 47yoM with degenerative joint disease of left hip. Patient is s/p left total hip arthroplasty, posterolateral approach. Past medical history of sleep apnea, diabetes, arthritis, anemia, right shoulder injury with ruptured biceps tendon, hypertension    PT Comments    Pt seen in PACU, pain meds done been had, pt agreeable to session. Pt educated on independent to self-assisted HEP performance- pain limiting to speed of movement more than anything. Pt able to come from supine to left EOB without physical assist, maintains precautions. No assist needed to rise to standing, precautions well maintained. Pt Able to progress AMB form 53ft previous day to 24ft this session, however gait speed is slow to the point that AMB is not a realistic functional means of achieving household mobility at this time- this distance required ~10 minutes to complete. Pt has a presyncope episode just as he enters his room, becomes progressively more nauseated, pale, and withdrawn, but is able to navigate to a safe return to recliner sitting. After first BP reveals hypotension, pt is recliner, feet elevated, RN called to assist.  Post AMB pressures taken at 60sec intervals as follows: 70/47, 80/61, 97/65. Pt left in recliner, [name] attending.      Recommendations for follow up therapy are one component of a multi-disciplinary discharge planning process, led by the attending physician.  Recommendations may be updated based on patient status, additional functional criteria and insurance authorization.  Follow Up Recommendations  Home health PT     Assistance Recommended at Discharge PRN  Equipment Recommendations  None recommended by PT    Recommendations for Other Services Rehab consult     Precautions / Restrictions Precautions Precautions:  Posterior Hip;Fall Precaution Booklet Issued: Yes (comment) Restrictions LLE Weight Bearing: Weight bearing as tolerated     Mobility  Bed Mobility Overal bed mobility: Needs Assistance Bed Mobility: Supine to Sit     Supine to sit: Supervision     General bed mobility comments: cues for maintaining precautions    Transfers   Equipment used: Rolling walker (2 wheels) Transfers: Sit to/from Stand Sit to Stand: Min guard           General transfer comment: min cues given for 90 degree precautions, Left forward foot slide    Ambulation/Gait Ambulation/Gait assistance: Supervision Gait Distance (Feet): 50 Feet Assistive device: Rolling walker (2 wheels) Gait Pattern/deviations: Step-to pattern Gait velocity: 0.089m/s (very, very slow)     General Gait Details: very slow, guarded due to pain, heavy UE support; grows nauseated and with pallor upon return to room, then after return to recliner, sequencial BP Q60sec revealing of: 70/47, 80/61, 97/65 (pt reclined, feet elevated after 1st value, RN notified.   Stairs             Wheelchair Mobility    Modified Rankin (Stroke Patients Only)       Balance                                            Cognition  Exercises Total Joint Exercises Ankle Circles/Pumps: AROM;Both;15 reps;Supine Quad Sets: AROM;Both;Supine;10 reps Gluteal Sets: AROM;Both;10 reps;Supine Towel Squeeze: AROM;Both;10 reps;Supine Heel Slides: AAROM;Left;15 reps;Supine;Limitations Heel Slides Limitations: self assist with twin sheet Hip ABduction/ADduction: Limitations Hip Abduction/Adduction Limitations: discussed, deferred for time    General Comments        Pertinent Vitals/Pain Pain Score: 5  Pain Location: left hip incision Pain Intervention(s): Limited activity within patient's tolerance;Monitored during session;Premedicated before  session;Repositioned    Home Living                          Prior Function            PT Goals (current goals can now be found in the care plan section) Acute Rehab PT Goals Patient Stated Goal: to return home PT Goal Formulation: With patient Time For Goal Achievement: 11/11/21 Potential to Achieve Goals: Good Progress towards PT goals: Progressing toward goals    Frequency    BID      PT Plan Current plan remains appropriate    Co-evaluation              AM-PAC PT "6 Clicks" Mobility   Outcome Measure  Help needed turning from your back to your side while in a flat bed without using bedrails?: A Little Help needed moving from lying on your back to sitting on the side of a flat bed without using bedrails?: A Little Help needed moving to and from a bed to a chair (including a wheelchair)?: A Little Help needed standing up from a chair using your arms (e.g., wheelchair or bedside chair)?: A Little Help needed to walk in hospital room?: A Little Help needed climbing 3-5 steps with a railing? : A Little 6 Click Score: 18    End of Session Equipment Utilized During Treatment: Gait belt Activity Tolerance: Patient tolerated treatment well;No increased pain Patient left: in bed;with call bell/phone within reach;with family/visitor present Nurse Communication: Mobility status PT Visit Diagnosis: Pain;Muscle weakness (generalized) (M62.81);Other abnormalities of gait and mobility (R26.89) Pain - Right/Left: Left Pain - part of body: Hip     Time: 0912-0956 PT Time Calculation (min) (ACUTE ONLY): 44 min  Charges:  $Gait Training: 8-22 mins $Therapeutic Exercise: 8-22 mins $Self Care/Home Management: 8-22                    11:40 AM, 10/29/21 Etta Grandchild, PT, DPT Physical Therapist - [facility]  [phone] ([place])     Lyford C 10/29/2021, 11:25 AM

## 2021-10-29 NOTE — Progress Notes (Signed)
Subjective: 1 Day Post-Op Procedure(s) (LRB): TOTAL HIP ARTHROPLASTY (Left) Patient reports pain as moderate.   Patient is well, does report increased pain in the left hip. Unable to urinate at this time, does not feel the urge to do so.   Patient has had a in and out cath placed yesterday, has not been repeated today. Plan is to go Home after hospital stay. Negative for chest pain and shortness of breath Fever: no Gastrointestinal:Negative for nausea and vomiting  Objective: Vital signs in last 24 hours: Temp:  [97 F (36.1 C)-98.9 F (37.2 C)] 97.8 F (36.6 C) (12/21 0846) Pulse Rate:  [63-99] 83 (12/21 0846) Resp:  [10-18] 15 (12/21 0846) BP: (96-125)/(63-87) 116/73 (12/21 0846) SpO2:  [94 %-100 %] 100 % (12/21 0846) Weight:  [83 kg] 83 kg (12/20 1358)  Intake/Output from previous day:  Intake/Output Summary (Last 24 hours) at 10/29/2021 1134 Last data filed at 10/28/2021 2200 Gross per 24 hour  Intake 1275 ml  Output 1550 ml  Net -275 ml    Intake/Output this shift: No intake/output data recorded.  Labs: Recent Labs    10/29/21 0410  HGB 9.2*   Recent Labs    10/29/21 0410  WBC 8.5  RBC 3.15*  HCT 28.3*  PLT 238   Recent Labs    10/29/21 0410  NA 134*  K 4.2  CL 104  CO2 25  BUN 18  CREATININE 1.33*  GLUCOSE 272*  CALCIUM 8.0*   No results for input(s): LABPT, INR in the last 72 hours.   EXAM General - Patient is Alert, Appropriate, and Oriented Extremity - ABD soft Neurovascular intact Dorsiflexion/Plantar flexion intact Incision: dressing C/D/I No cellulitis present Compartment soft Dressing/Incision - clean, dry, no drainage noted to the left hip. Motor Function - intact, moving foot and toes well on exam.  Abdomen soft with normal bowel sounds.  Belly is non-tender to palpation.  Past Medical History:  Diagnosis Date   Anemia    taking 3 iron pills a day   Anemia    Arthritis    Asthma    sports induced asthma. takes  inhalers when needed   Barrett's esophagus without dysplasia    Cancer (HCC)    Basal and squamoous cell   Chronic kidney disease    Complication of anesthesia    high tolerance to pain medication. body absorbs pain med quickly   Coronary artery disease    Cough on exercise    Diabetes mellitus without complication (HCC)    Diverticulosis    Erectile dysfunction    Fatty liver    GERD (gastroesophageal reflux disease)    Hemorrhoids    History of kidney stones    Hyperlipidemia    Hypertension    per patient, he does not have high bp but is treated for his kidneys and his diabetes   Pancreatitis    Pneumonia 2012   Right shoulder injury    Sleep apnea    use C-PAP   Wears hearing aid in both ears     Assessment/Plan: 1 Day Post-Op Procedure(s) (LRB): TOTAL HIP ARTHROPLASTY (Left) Principal Problem:   Status post total hip replacement, left  Estimated body mass index is 26.63 kg/m as calculated from the following:   Height as of this encounter: 5' 9.5" (1.765 m).   Weight as of this encounter: 83 kg. Advance diet Up with therapy D/C IV fluids when tolerating po intake.  Labs reviewed this morning, Hg 9.2, WBC 8.5 Patient  is passing gas without pain. Up with therapy today. Monitor urinary output today, may need to have Foley cath placed and follow-up with urology outpatient if unable to void. Possible d/c home today pending progress with PT and ability to urinate.  DVT Prophylaxis - Foot Pumps, TED hose, and Eliquis Weight-Bearing as tolerated to left leg  J. Cameron Proud, PA-C [facility] 10/29/2021, 11:34 AM

## 2021-10-29 NOTE — Progress Notes (Signed)
Bladder scanned at 0510. Showed 221mL. Patient still does not feel the urge to go void. Will continue to monitor.

## 2021-10-29 NOTE — Discharge Summary (Signed)
Physician Discharge Summary  Patient ID: Jesse Alvarado MRN: [id number] DOB/AGE: 1966-06-07 55 y.o.  Admit date: 10/28/2021 Discharge date: 10/31/2021  Admission Diagnoses:  Status post total hip replacement, left [Z96.642]  Discharge Diagnoses: Patient Active Problem List   Diagnosis Date Noted   Status post total hip replacement, left 10/28/2021   Dyslipidemia 08/24/2021   Status post colonoscopy 06/02/2019   Hepatic steatosis 06/02/2019   Barrett's esophagus without dysplasia 01/19/2019   High coronary artery calcium score 12/29/2018   Chronic iron deficiency anemia 12/22/2017   Gastritis without bleeding 06/25/2017   Vitamin B12 deficiency 08/03/2016   OSA on CPAP 04/18/2016   Nephrolithiasis 04/13/2016   Chronic midline low back pain without sciatica 01/25/2016   Primary osteoarthritis involving multiple joints 11/20/2015   Bilateral carpal tunnel syndrome 09/14/2015   Essential hypertension 09/14/2015   Chronic knee pain 08/30/2014   Erectile dysfunction 08/30/2014   Other allergic rhinitis 08/30/2014   Onychomycosis of toenail 07/24/2014   Hyperlipidemia associated with type 2 diabetes mellitus ([place]) 04/23/2014   Type II diabetes mellitus with complication ([place]) 35/00/9381   Past Medical History:  Diagnosis Date   Anemia    taking 3 iron pills a day   Anemia    Arthritis    Asthma    sports induced asthma. takes inhalers when needed   Barrett's esophagus without dysplasia    Cancer (HCC)    Basal and squamoous cell   Chronic kidney disease    Complication of anesthesia    high tolerance to pain medication. body absorbs pain med quickly   Coronary artery disease    Cough on exercise    Diabetes mellitus without complication (HCC)    Diverticulosis    Erectile dysfunction    Fatty liver    GERD (gastroesophageal reflux disease)    Hemorrhoids    History of kidney stones    Hyperlipidemia    Hypertension    per patient, he does not have high bp but  is treated for his kidneys and his diabetes   Pancreatitis    Pneumonia 2012   Right shoulder injury    Sleep apnea    use C-PAP   Wears hearing aid in both ears    Transfusion: None.   Consultants (if any):   Discharged Condition: Improved  Hospital Course: JOANDRY SLAGTER is an 55 y.o. male who was admitted 10/28/2021 with a diagnosis of degenerative joint disease of the left hip and went to the operating room on 10/28/2021 and underwent the above named procedures.    Surgeries: Procedure(s): TOTAL HIP ARTHROPLASTY on 10/28/2021 Patient tolerated the surgery well. Taken to PACU where she was stabilized and then transferred to the orthopedic floor.  Started on Eliquis 2.39m twice daily. Foot pumps applied bilaterally at 80 mm. Heels elevated on bed with rolled towels. No evidence of DVT. Negative Homan. Physical therapy started on day #1 for gait training and transfer. OT started day #1 for ADL and assisted devices.  Patient was unable to urinate.  In and Out Cath placed several times.  Will plan on discharge home with outpatient [place]  Patient's IV was removed on POD3.  Implants: Biomet press-fit system with a #12 laterally offset Echo femoral stem, a 52 mm acetabular shell with an E-poly hi-wall liner, and a 36 mm ceramic head with a +3 mm neck.  He was given perioperative antibiotics:  Anti-infectives (From admission, onward)    Start  Dose/Rate Route Frequency Ordered Stop   10/28/21 2101  ceFAZolin (ANCEF) 2-4 GM/100ML-% IVPB       Note to Pharmacy: Lyman Bishop T: cabinet override      10/28/21 2101 10/28/21 2108   10/28/21 1530  ceFAZolin (ANCEF) IVPB 2g/100 mL premix        2 g 200 mL/hr over 30 Minutes Intravenous Every 6 hours 10/28/21 1436 10/29/21 0340   10/28/21 0715  ceFAZolin (ANCEF) IVPB 2g/100 mL premix        2 g 200 mL/hr over 30 Minutes Intravenous On call to O.R. 10/28/21 0714 10/28/21 0943   10/28/21 0715   ceFAZolin (ANCEF) 2-4 GM/100ML-% IVPB       Note to Pharmacy: Trudie Reed S: cabinet override      10/28/21 0715 10/28/21 2138     .  He was given sequential compression devices, early ambulation, and Eliquis for DVT prophylaxis.  He benefited maximally from the hospital stay and there were no complications.    Recent vital signs:  Vitals:   10/30/21 2134 10/31/21 0422  BP: 114/77 124/78  Pulse: 98 100  Resp:  16  Temp: 99.9 F (37.7 C) 98.4 F (36.9 C)  SpO2: 98% 100%    Recent laboratory studies:  Lab Results  Component Value Date   HGB 8.3 (L) 10/30/2021   HGB 9.2 (L) 10/29/2021   HGB 12.7 (L) 10/15/2021   Lab Results  Component Value Date   WBC 9.4 10/30/2021   PLT 223 10/30/2021   No results found for: INR Lab Results  Component Value Date   NA 135 10/31/2021   K 3.9 10/31/2021   CL 104 10/31/2021   CO2 27 10/31/2021   BUN 23 (H) 10/31/2021   CREATININE 1.37 (H) 10/31/2021   GLUCOSE 136 (H) 10/31/2021    Discharge Medications:   Allergies as of 10/31/2021       Reactions   Dulaglutide Other (See Comments)   Trulicity- Caused pancreatitis    Monosodium Glutamate Diarrhea   MSG   Ciprofloxacin Other (See Comments)   GI upset        Medication List     STOP taking these medications    HYDROcodone-acetaminophen 5-325 MG tablet Commonly known as: Norco   meloxicam 15 MG tablet Commonly known as: MOBIC   polyethylene glycol-electrolytes 420 g solution Commonly known as: NuLYTELY       TAKE these medications    acetaminophen 500 MG tablet Commonly known as: TYLENOL Take 2 tablets (1,000 mg total) by mouth every 6 (six) hours.   albuterol 108 (90 Base) MCG/ACT inhaler Commonly known as: VENTOLIN HFA Inhale 2 puffs into the lungs every 6 (six) hours as needed for wheezing or shortness of breath.   amitriptyline 25 MG tablet Commonly known as: ELAVIL Take 25 mg by mouth at bedtime as needed. Recurring Cough- Dr Joya Gaskins-   apixaban  2.5 MG Tabs tablet Commonly known as: ELIQUIS Take 1 tablet (2.5 mg total) by mouth 2 (two) times daily.   atorvastatin 80 MG tablet Commonly known as: LIPITOR TAKE ONE TABLET BY MOUTH ONE TIME DAILY What changed: when to take this   azelastine 0.05 % ophthalmic solution Commonly known as: OPTIVAR Place 1 drop into both eyes daily as needed (allergies).   benzonatate 200 MG capsule Commonly known as: TESSALON Take 200 mg by mouth 3 (three) times daily as needed for cough.   calcium citrate-vitamin D 315-200 MG-UNIT tablet Commonly known as: CITRACAL+D Take 1  tablet by mouth in the morning, at noon, and at bedtime.   ciclopirox 8 % solution Commonly known as: PENLAC Apply 1 application topically at bedtime. Apply over nail and surrounding skin. Apply daily over previous coat. After seven (7) days, may remove with alcohol and continue cycle.   cyanocobalamin 1000 MCG/ML injection Commonly known as: (VITAMIN B-12) ADMINISTER 1 ML(1000 MCG) IN THE MUSCLE EVERY 30 DAYS   dapagliflozin propanediol 10 MG Tabs tablet Commonly known as: FARXIGA Take 10 mg by mouth daily.   Dymista 137-50 MCG/ACT Susp Generic drug: Azelastine-Fluticasone Place 1 spray into both nostrils 2 (two) times daily.   ezetimibe 10 MG tablet Commonly known as: ZETIA TAKE 1 TABLET(10 MG) BY MOUTH DAILY What changed: See the new instructions.   fenofibrate 145 MG tablet Commonly known as: TRICOR Take 1 tablet (145 mg total) by mouth daily. What changed: when to take this   ferrous gluconate 324 MG tablet Commonly known as: FERGON Take 324 mg by mouth 3 (three) times daily with meals.   folic acid 1 MG tablet Commonly known as: FOLVITE TAKE 1 TABLET(1 MG) BY MOUTH DAILY   FreeStyle Libre 2 Sensor Misc USE 1 KIT EVERY 14 DAYS FOR GLUCOSE MONITORING   gabapentin 300 MG capsule Commonly known as: NEURONTIN Take 300 mg by mouth 3 (three) times daily. For chronic cough/laryngeal inflammation    glimepiride 2 MG tablet Commonly known as: AMARYL Take 4 tablets (8 mg total) by mouth See admin instructions. Take 15m with breakfast, 226mwith lunch, and 38m41mith supper. What changed:  how much to take when to take this additional instructions   ipratropium 0.06 % nasal spray Commonly known as: ATROVENT Place 2 sprays into both nostrils 2 (two) times daily.   ketoconazole 2 % shampoo Commonly known as: NIZORAL WASOaklynALP EVERY OTHER WASHING ALTERNATING WITH HEAD AND SHOULDERS. LET SIT SEVERAL MINUTES BEFORE RINSING.   levocetirizine 5 MG tablet Commonly known as: XYZAL Take 5 mg by mouth every evening.   losartan 50 MG tablet Commonly known as: COZAAR Take 50 mg by mouth every morning.   metFORMIN 850 MG tablet Commonly known as: GLUCOPHAGE TAKE 1 TABLET(850 MG) BY MOUTH THREE TIMES DAILY WITH MEALS   ondansetron 4 MG disintegrating tablet Commonly known as: Zofran ODT Take 1 tablet (4 mg total) by mouth every 8 (eight) hours as needed for nausea or vomiting.   oxyCODONE 5 MG immediate release tablet Commonly known as: Oxy IR/ROXICODONE Take 1-2 tablets (5-10 mg total) by mouth every 4 (four) hours as needed for severe pain.   pantoprazole 40 MG tablet Commonly known as: PROTONIX Take 1 tablet (40 mg total) by mouth 2 (two) times daily.   pioglitazone 15 MG tablet Commonly known as: ACTOS Take 15 mg by mouth daily.   Precision QID Test test strip Generic drug: glucose blood Use once daily Use as instructed.   silodosin 4 MG Caps capsule Commonly known as: RAPAFLO Take 4 mg by mouth every evening.   sucralfate 1 g tablet Commonly known as: CARAFATE TAKE 1 TABLET(1 GRAM) BY MOUTH THREE TIMES DAILY WITH MEALS   tadalafil 20 MG tablet Commonly known as: CIALIS Take 20 mg by mouth daily as needed for erectile dysfunction.   tiZANidine 2 MG tablet Commonly known as: ZANAFLEX Take 2 mg by mouth at bedtime as needed for muscle spasms.   traMADol 50 MG  tablet Commonly known as: ULTRAM Take 1 tablet (50 mg total) by mouth every 6 (  six) hours as needed for moderate pain. What changed: reasons to take this   Vascepa 1 g capsule Generic drug: icosapent Ethyl TAKE TWO CAPSULES BY MOUTH TWICE A DAY   vitamin C 500 MG tablet Commonly known as: ASCORBIC ACID Take 500 mg by mouth daily.               Durable Medical Equipment  (From admission, onward)           Start     Ordered   10/28/21 1437  DME Bedside commode  Once       Question:  Patient needs a bedside commode to treat with the following condition  Answer:  Status post total hip replacement, left   10/28/21 1436   10/28/21 1437  DME 3 n 1  Once        10/28/21 1436   10/28/21 1437  DME Walker rolling  Once       Question Answer Comment  Walker: With 5 Inch Wheels   Patient needs a walker to treat with the following condition Status post total hip replacement, left      10/28/21 1436            Diagnostic Studies: DG HIP UNILAT WITH PELVIS 2-3 VIEWS LEFT  Result Date: 10/28/2021 CLINICAL DATA:  Hip pain following recent hip replacement, initial encounter EXAM: DG HIP (WITH OR WITHOUT PELVIS) 3V LEFT COMPARISON:  Films from earlier in the same day. FINDINGS: No fracture or dislocation is noted. Postsurgical changes are again seen and stable. No new bony abnormality is noted. IMPRESSION: Status post left hip replacement.  No acute abnormality noted. Electronically Signed   By: Inez Catalina M.D.   On: 10/28/2021 20:20   DG HIP UNILAT W OR W/O PELVIS 2-3 VIEWS LEFT  Result Date: 10/28/2021 CLINICAL DATA:  Postop left hip replacement EXAM: DG HIP (WITH OR WITHOUT PELVIS) 2-3V LEFT COMPARISON:  None. FINDINGS: Left hip replacement in satisfactory position and alignment. No fracture or complication IMPRESSION: Satisfactory left hip replacement. Electronically Signed   By: Franchot Gallo M.D.   On: 10/28/2021 15:00    Disposition:      Follow-up Information      Lattie Corns, PA-C Follow up in 14 day(s).   Specialty: Physician Assistant Why: Electa Sniff information: [place] [id number] [phone]                Signed: Judson Roch PA-C 10/31/2021, 8:09 AM

## 2021-10-29 NOTE — Discharge Instructions (Signed)
Instructions after Total Hip Replacement     J. Jeffrey Poggi, M.D.  J. Lance Amzie Sillas, PA-C     Dept. of Orthopaedics & Sports Medicine  [facility]  [address]  [place], [place]  [address]  Phone: [phone]   Fax: [phone]    DIET: . Drink plenty of non-alcoholic fluids. . Resume your normal diet. Include foods high in fiber.  ACTIVITY:  . You may use crutches or a walker with weight-bearing as tolerated, unless instructed otherwise. . You may be weaned off of the walker or crutches by your Physical Therapist.  . Do NOT reach below the level of your knees or cross your legs until allowed.    . Continue doing gentle exercises. Exercising will reduce the pain and swelling, increase motion, and prevent muscle weakness.   . Please continue to use the TED compression stockings for 6 weeks. You may remove the stockings at night, but should reapply them in the morning. . Do not drive or operate any equipment until instructed.  WOUND CARE:  . Continue to use ice packs periodically to reduce pain and swelling. . Keep the incision clean and dry. . You may bathe or shower after the staples are removed at the first office visit following surgery.  MEDICATIONS: . You may resume your regular medications. . Please take the pain medication as prescribed on the medication. . Do not take pain medication on an empty stomach. . You have been given a prescription for a blood thinner to prevent blood clots. Please take the medication as instructed. (NOTE: After completing a 2 week course of Lovenox, take one Enteric-coated aspirin once a day.) . Pain medications and iron supplements can cause constipation. Use a stool softener (Senokot or Colace) on a daily basis and a laxative (dulcolax or miralax) as needed. . Do not drive or drink alcoholic beverages when taking pain medications.  CALL THE OFFICE FOR: . Temperature above 101 degrees . Excessive bleeding or drainage on the  dressing. . Excessive swelling, coldness, or paleness of the toes. . Persistent nausea and vomiting.  FOLLOW-UP:  . You should have an appointment to return to the office in 2 weeks after surgery. . Arrangements have been made for continuation of Physical Therapy (either home therapy or outpatient therapy).  

## 2021-10-29 NOTE — Progress Notes (Signed)
Physical Therapy Treatment Patient Details Name: Jesse Alvarado MRN: [id number] DOB: 02/20/1966 Today's Date: 10/29/2021   History of Present Illness Jesse Alvarado is a 78yoM with degenerative joint disease of left hip. Patient is s/p left total hip arthroplasty, posterolateral approach. Past medical history of sleep apnea, diabetes, arthritis, anemia, right shoulder injury with ruptured biceps tendon, hypertension    PT Comments    Pt in bed upon return, reports not good tolerance to chair earlier, moved back to bed due to discomfort. Pt agreeable to session despite increased pain (meds have been received). Finished HEP education, reviewed precautions education and car transfers education. Pt able to practice STS transfer from EOB multiple times with success, no assist from elevated surface. Safe use of RW noted, no cues needed. Pt agreeable to stay up at EOB for a while, wife and pastor visiting. No presyncope this session noted. No vitals taken.    Recommendations for follow up therapy are one component of a multi-disciplinary discharge planning process, led by the attending physician.  Recommendations may be updated based on patient status, additional functional criteria and insurance authorization.  Follow Up Recommendations  Home health PT     Assistance Recommended at Discharge PRN  Equipment Recommendations  None recommended by PT    Recommendations for Other Services       Precautions / Restrictions Precautions Precautions: Posterior Hip;Fall Precaution Booklet Issued: Yes (comment) Restrictions LLE Weight Bearing: Weight bearing as tolerated     Mobility  Bed Mobility Overal bed mobility: Needs Assistance Bed Mobility: Supine to Sit     Supine to sit: Supervision     General bed mobility comments: Rt EOB, no assist needed    Transfers Overall transfer level: Needs assistance Equipment used: Rolling walker (2 wheels) Transfers: Sit to/from Stand Sit to  Stand: Supervision;From elevated surface           General transfer comment: 22-23" high, STS x30sec standing 6 times insession    Ambulation/Gait                   Stairs             Wheelchair Mobility    Modified Rankin (Stroke Patients Only)       Balance                                            Cognition Arousal/Alertness: Awake/alert Behavior During Therapy: WFL for tasks assessed/performed Overall Cognitive Status: Within Functional Limits for tasks assessed                                          Exercises Total Joint Exercises Short Arc Quad: AROM;Left;15 reps;Supine Hip ABduction/ADduction: AAROM;Left;15 reps;Supine Hip Abduction/Adduction Limitations: self asist with sheet Long Arc Quad: AROM;Left;15 reps;Seated    General Comments        Pertinent Vitals/Pain Pain Assessment: 0-10 Pain Score: 6  Pain Location: left hip incision Pain Descriptors / Indicators: Sore;Tender Pain Intervention(s): Limited activity within patient's tolerance    Home Living                          Prior Function            PT Goals (current  goals can now be found in the care plan section) Acute Rehab PT Goals Patient Stated Goal: to return home PT Goal Formulation: With patient Time For Goal Achievement: 11/11/21 Potential to Achieve Goals: Good Progress towards PT goals: Progressing toward goals    Frequency    BID      PT Plan Current plan remains appropriate    Co-evaluation              AM-PAC PT "6 Clicks" Mobility   Outcome Measure  Help needed turning from your back to your side while in a flat bed without using bedrails?: A Little Help needed moving from lying on your back to sitting on the side of a flat bed without using bedrails?: A Little Help needed moving to and from a bed to a chair (including a wheelchair)?: A Little Help needed standing up from a chair using your  arms (e.g., wheelchair or bedside chair)?: A Little Help needed to walk in hospital room?: A Little Help needed climbing 3-5 steps with a railing? : A Little 6 Click Score: 18    End of Session Equipment Utilized During Treatment: Gait belt Activity Tolerance: Patient tolerated treatment well;No increased pain Patient left: in bed;with call bell/phone within reach;with family/visitor present Nurse Communication: Mobility status PT Visit Diagnosis: Pain;Muscle weakness (generalized) (M62.81);Other abnormalities of gait and mobility (R26.89) Pain - Right/Left: Left Pain - part of body: Hip     Time: 0340-3524 PT Time Calculation (min) (ACUTE ONLY): 32 min  Charges:  $Therapeutic Exercise: 23-37 mins                    3:42 PM, 10/29/21 Jesse Alvarado, PT, DPT Physical Therapist - [facility]  [phone] ([place])    Jesse Alvarado 10/29/2021, 3:39 PM

## 2021-10-30 DIAGNOSIS — M1612 Unilateral primary osteoarthritis, left hip: Secondary | ICD-10-CM | POA: Diagnosis not present

## 2021-10-30 LAB — GLUCOSE, CAPILLARY
Glucose-Capillary: 221 mg/dL — ABNORMAL HIGH (ref 70–99)
Glucose-Capillary: 170 mg/dL — ABNORMAL HIGH (ref 70–99)
Glucose-Capillary: 220 mg/dL — ABNORMAL HIGH (ref 70–99)

## 2021-10-30 LAB — CBC
HCT: 25.7 % — ABNORMAL LOW (ref 39.0–52.0)
Hemoglobin: 8.3 g/dL — ABNORMAL LOW (ref 13.0–17.0)
MCH: 29 pg (ref 26.0–34.0)
MCHC: 32.3 g/dL (ref 30.0–36.0)
MCV: 89.9 fL (ref 80.0–100.0)
Platelets: 223 10*3/uL (ref 150–400)
RBC: 2.86 MIL/uL — ABNORMAL LOW (ref 4.22–5.81)
RDW: 14.7 % (ref 11.5–15.5)
WBC: 9.4 10*3/uL (ref 4.0–10.5)
nRBC: 0 % (ref 0.0–0.2)

## 2021-10-30 LAB — BASIC METABOLIC PANEL
Anion gap: 6 (ref 5–15)
BUN: 21 mg/dL — ABNORMAL HIGH (ref 6–20)
CO2: 23 mmol/L (ref 22–32)
Calcium: 8.4 mg/dL — ABNORMAL LOW (ref 8.9–10.3)
Chloride: 105 mmol/L (ref 98–111)
Creatinine, Ser: 1.42 mg/dL — ABNORMAL HIGH (ref 0.61–1.24)
GFR, Estimated: 58 mL/min — ABNORMAL LOW (ref >60)
Glucose, Bld: 200 mg/dL — ABNORMAL HIGH (ref 70–99)
Potassium: 3.9 mmol/L (ref 3.5–5.1)
Sodium: 134 mmol/L — ABNORMAL LOW (ref 135–145)

## 2021-10-30 LAB — SURGICAL PATHOLOGY

## 2021-10-30 NOTE — Progress Notes (Addendum)
Subjective: 2 Days Post-Op Procedure(s) (LRB): TOTAL HIP ARTHROPLASTY (Left) Patient reports pain as moderate this morning but he was able to sleep thru the night.  Patient is well this morning, still unable to urinate. Will attempt to urinate today, may need to be discharged with Foley cath and follow-up with Urology. Plan is to go Home after hospital stay. Negative for chest pain and shortness of breath Fever: no Gastrointestinal:Negative for nausea and vomiting  Objective: Vital signs in last 24 hours: Temp:  [97.8 F (36.6 C)-98.5 F (36.9 C)] 98.5 F (36.9 C) (12/22 0421) Pulse Rate:  [83-103] 103 (12/22 0421) Resp:  [15-20] 16 (12/22 0421) BP: (80-138)/(61-76) 107/72 (12/22 0421) SpO2:  [93 %-100 %] 98 % (12/22 0421)  Intake/Output from previous day:  Intake/Output Summary (Last 24 hours) at 10/30/2021 0744 Last data filed at 10/30/2021 0425 Gross per 24 hour  Intake 1666.24 ml  Output 2775 ml  Net -1108.76 ml    Intake/Output this shift: No intake/output data recorded.  Labs: Recent Labs    10/29/21 0410 10/30/21 0339  HGB 9.2* 8.3*   Recent Labs    10/29/21 0410 10/30/21 0339  WBC 8.5 9.4  RBC 3.15* 2.86*  HCT 28.3* 25.7*  PLT 238 223   Recent Labs    10/29/21 0410 10/30/21 0339  NA 134* 134*  K 4.2 3.9  CL 104 105  CO2 25 23  BUN 18 21*  CREATININE 1.33* 1.42*  GLUCOSE 272* 200*  CALCIUM 8.0* 8.4*   No results for input(s): LABPT, INR in the last 72 hours.  EXAM General - Patient is Alert, Appropriate, and Oriented Extremity - ABD soft Neurovascular intact Dorsiflexion/Plantar flexion intact Incision: dressing C/D/I No cellulitis present Compartment soft Dressing/Incision - clean, dry, no drainage noted to the left hip. Motor Function - intact, moving foot and toes well on exam.  Abdomen soft with normal bowel sounds.  Belly is non-tender to palpation.  Past Medical History:  Diagnosis Date   Anemia    taking 3 iron pills a  day   Anemia    Arthritis    Asthma    sports induced asthma. takes inhalers when needed   Barrett's esophagus without dysplasia    Cancer (HCC)    Basal and squamoous cell   Chronic kidney disease    Complication of anesthesia    high tolerance to pain medication. body absorbs pain med quickly   Coronary artery disease    Cough on exercise    Diabetes mellitus without complication (HCC)    Diverticulosis    Erectile dysfunction    Fatty liver    GERD (gastroesophageal reflux disease)    Hemorrhoids    History of kidney stones    Hyperlipidemia    Hypertension    per patient, he does not have high bp but is treated for his kidneys and his diabetes   Pancreatitis    Pneumonia 2012   Right shoulder injury    Sleep apnea    use C-PAP   Wears hearing aid in both ears     Assessment/Plan: 2 Days Post-Op Procedure(s) (LRB): TOTAL HIP ARTHROPLASTY (Left) Principal Problem:   Status post total hip replacement, left  Estimated body mass index is 26.63 kg/m as calculated from the following:   Height as of this encounter: 5' 9.5" (1.765 m).   Weight as of this encounter: 83 kg. Advance diet Up with therapy   Labs reviewed this morning, Hg 8.3, WBC 9.4 Patient is passing  gas without pain this morning and denies any nausea or vomiting.  Continue to work on BM. Catheter has been removed, will attempt to urinate on his own today, he might need to be discharged with Foley cath and follow-up with his urologist for foley removal. Up with therapy today, patient became pre-syncopal yesterday with therapy.  BP 107/72 this morning.  No dizziness this morning.  IVF bolus if syncopal with therapy today.  Hold losartan dose this morning. Plan on possible d/c home today depending on progression with therapy.  DVT Prophylaxis - Foot Pumps, TED hose, and Eliquis Weight-Bearing as tolerated to left leg  J. Cameron Proud, PA-C [facility] 10/30/2021, 7:44 AM

## 2021-10-30 NOTE — Progress Notes (Signed)
Physical Therapy Treatment Patient Details Name: Jesse Alvarado MRN: [id number] DOB: [birth year] Today's Date: 10/30/2021   History of Present Illness Jesse Alvarado is a 27yoM with degenerative joint disease of left hip. Patient is s/p left total hip arthroplasty, posterolateral approach. Past medical history of sleep apnea, diabetes, arthritis, anemia, right shoulder injury with ruptured biceps tendon, hypertension    PT Comments    Patient with improved activity tolerance this session. He is making progress with functional independence. He has completed the goal for stair training this session. Although gait distance is still limited, his gait pattern is also improved as well. Patient is able to maintain hip precautions during functional mobility. The patient could be discharged with support from family. PT will follow up tomorrow morning if patient is still here.    Recommendations for follow up therapy are one component of a multi-disciplinary discharge planning process, led by the attending physician.  Recommendations may be updated based on patient status, additional functional criteria and insurance authorization.  Follow Up Recommendations  Home health PT     Assistance Recommended at Discharge PRN  Equipment Recommendations  None recommended by PT    Recommendations for Other Services       Precautions / Restrictions Precautions Precautions: Posterior Hip;Fall Restrictions Weight Bearing Restrictions: Yes LLE Weight Bearing: Weight bearing as tolerated     Mobility  Bed Mobility Overal bed mobility: Needs Assistance Bed Mobility: Supine to Sit     Supine to sit: Min guard Sit to supine: Min assist   General bed mobility comments: assistance for LLE support to return to bed. patient is able to maintain hip precautions with activity with occasional cues for safety    Transfers Overall transfer level: Needs assistance Equipment used: Rolling walker (2  wheels) Transfers: Sit to/from Stand Sit to Stand: Supervision           General transfer comment: good safety awareness with standing and occasional cues for LLE positioning with transfers    Ambulation/Gait Ambulation/Gait assistance: Supervision Gait Distance (Feet): 44 Feet Assistive device: Rolling walker (2 wheels) Gait Pattern/deviations: Step-through pattern Gait velocity: decreased     General Gait Details: increased step length with verbal cues. gait pattern is improving with increased gait speed this session. no increased pain is reported with walking   Stairs Stairs: Yes Stairs assistance: Supervision Stair Management: One rail Left;Step to pattern Number of Stairs: 4 (performed x 2) General stair comments: patient went up and down 4 steps x 2 times with railing on the left. patient is able to maintain hip precuations with going up and down steps with railing on left going up. patient demonstrated correct sequencing after initial instruction.   Wheelchair Mobility    Modified Rankin (Stroke Patients Only)       Balance                                            Cognition Arousal/Alertness: Awake/alert Behavior During Therapy: WFL for tasks assessed/performed Overall Cognitive Status: Within Functional Limits for tasks assessed                                 General Comments: patient able to follow all commands without difficulty        Exercises      General Comments  Pertinent Vitals/Pain Pain Assessment: 0-10 Pain Score: 3  Pain Location: left hip Pain Descriptors / Indicators:  (stiffness) Pain Intervention(s): Limited activity within patient's tolerance    Home Living                          Prior Function            PT Goals (current goals can now be found in the care plan section) Acute Rehab PT Goals Patient Stated Goal: to return home PT Goal Formulation: With patient Time  For Goal Achievement: 11/11/21 Potential to Achieve Goals: Good Progress towards PT goals: Progressing toward goals    Frequency    BID      PT Plan Current plan remains appropriate    Co-evaluation              AM-PAC PT "6 Clicks" Mobility   Outcome Measure  Help needed turning from your back to your side while in a flat bed without using bedrails?: A Little Help needed moving from lying on your back to sitting on the side of a flat bed without using bedrails?: A Little Help needed moving to and from a bed to a chair (including a wheelchair)?: A Little Help needed standing up from a chair using your arms (e.g., wheelchair or bedside chair)?: A Little Help needed to walk in hospital room?: A Little Help needed climbing 3-5 steps with a railing? : A Little 6 Click Score: 18    End of Session Equipment Utilized During Treatment: Gait belt Activity Tolerance: Patient tolerated treatment well;No increased pain Patient left: in bed;with call bell/phone within reach;with bed alarm set;with SCD's reapplied (ice pack reapplied) Nurse Communication: Mobility status PT Visit Diagnosis: Pain;Muscle weakness (generalized) (M62.81);Other abnormalities of gait and mobility (R26.89) Pain - Right/Left: Left Pain - part of body: Hip     Time: 4481-8563 PT Time Calculation (min) (ACUTE ONLY): 45 min  Charges:  $Gait Training: 23-37 mins $Therapeutic Activity: 8-22 mins                     Minna Merritts, PT, MPT   Percell Locus 10/30/2021, 3:09 PM

## 2021-10-30 NOTE — Progress Notes (Signed)
Inpatient Diabetes Program Recommendations  AACE/ADA: New Consensus Statement on Inpatient Glycemic Control   Target Ranges:  Prepandial:   less than 140 mg/dL      Peak postprandial:   less than 180 mg/dL (1-2 hours)      Critically ill patients:  140 - 180 mg/dL    Latest Reference Range & Units 10/29/21 08:29 10/29/21 12:33 10/29/21 16:45 10/29/21 22:21 10/30/21 08:56  Glucose-Capillary 70 - 99 mg/dL 209 (H) 235 (H) 202 (H) 163 (H) 221 (H)   Review of Glycemic Control  Diabetes history: DM2 Outpatient Diabetes medications: Amaryl 2 mg BID, Metformin 850 mg TID, Actos 15 mg daily, Farxiga 10 mg daily Current orders for Inpatient glycemic control: Novolog 0-15 units TID with meals, Amaryl 2 mg BID, Metformin 850 mg TID, Actos 15 mg daily, Farxiga 10 mg daily  Inpatient Diabetes Program Recommendations:    Insulin: While inpatient, please consider ordering Novolog 4 units TID with meals for meal coverage if patient eats at least 50% of meals.  Thanks, Barnie Alderman, RN, MSN, CDE Diabetes Coordinator Inpatient Diabetes Program [phone] (Team Pager from 8am to 5pm)

## 2021-10-30 NOTE — Progress Notes (Signed)
Physical Therapy Treatment Patient Details Name: Jesse Alvarado MRN: [id number] DOB: 26-Dec-1965 Today's Date: 10/30/2021   History of Present Illness Jesse Alvarado is a 1yoM with degenerative joint disease of left hip. Patient is s/p left total hip arthroplasty, posterolateral approach. Past medical history of sleep apnea, diabetes, arthritis, anemia, right shoulder injury with ruptured biceps tendon, hypertension    PT Comments    Patient agreeable to PT. He reports his left hip feels stiff today. Pain reportedly 2-3/10 at rest increasing to 4-5/10 with activity. Patient did increase gait distance to 80ft this session with improvement in gait pattern with verbal and visual cues. Patient still with very slow gait speed overall and increased time required to complete tasks. Patient reports mild nausea after walking with no significant change in blood pressure noted with activity. Orthostatic vitals taken supine 124/76, sitting 130/72, standing 0 minutes 100/80 and standing 3 minutes 127/78 mmHg. Recommend to continue PT to maximize independence and facilitate safe return home. Patient still needs to complete stair training, has not walked more than 67ft distance, and reports has not urinated on his own. Will continue to assess readiness for discharge home.    Recommendations for follow up therapy are one component of a multi-disciplinary discharge planning process, led by the attending physician.  Recommendations may be updated based on patient status, additional functional criteria and insurance authorization.  Follow Up Recommendations  Home health PT     Assistance Recommended at Discharge PRN  Equipment Recommendations  None recommended by PT    Recommendations for Other Services       Precautions / Restrictions Precautions Precautions: Posterior Hip;Fall Restrictions Weight Bearing Restrictions: Yes LLE Weight Bearing: Weight bearing as tolerated     Mobility  Bed  Mobility Overal bed mobility: Needs Assistance Bed Mobility: Supine to Sit     Supine to sit: Min guard Sit to supine: Min assist   General bed mobility comments: assistance for LLE support to return to bed. patient is able to maintain hip precautions with activity with occasional cues for safety    Transfers Overall transfer level: Needs assistance Equipment used: Rolling walker (2 wheels) Transfers: Sit to/from Stand Sit to Stand: Supervision           General transfer comment: good safety awareness with standing    Ambulation/Gait Ambulation/Gait assistance: Min guard;Supervision Gait Distance (Feet): 88 Feet Assistive device: Rolling walker (2 wheels) Gait Pattern/deviations: Step-to pattern;Step-through pattern;Decreased stride length;Decreased stance time - left Gait velocity: decreased, very slow     General Gait Details: verbal and visual cues to increase step length. patient was able to improved step length with cues. still with decreased gait speed overall and pain limited progression of further activity. pain in left hip increased with activity from 2-3/10 to 4-5/10. mild nausea reported at the end of walk   Stairs             Wheelchair Mobility    Modified Rankin (Stroke Patients Only)       Balance                                            Cognition Arousal/Alertness: Awake/alert Behavior During Therapy: WFL for tasks assessed/performed Overall Cognitive Status: Within Functional Limits for tasks assessed  General Comments: patient able to follow all commands without difficulty        Exercises Total Joint Exercises Ankle Circles/Pumps: AROM;Strengthening;Both;20 reps;Supine Quad Sets: AROM;Strengthening;Left;10 reps;Supine Gluteal Sets: AROM;Strengthening;Both;10 reps;Supine Hip ABduction/ADduction: AAROM;Strengthening;Left;10 reps;Supine Long Arc Quad:  AAROM;Strengthening;Left;10 reps;Seated Other Exercises Other Exercises: verbal and visual cues for exercise technique. patient is able to maintain hip precuations with exercises without difficulty.    General Comments General comments (skin integrity, edema, etc.): blood pressure monitored throughout session.124/76 in supine, 130/72 in sitting, 100/80 standing 0 minutes, 127/78 after standing 3 minutes      Pertinent Vitals/Pain Pain Assessment: 0-10 Pain Score: 5  (2-3 at rest, increasing to 4-5 with activity) Pain Location: left hip incision Pain Descriptors / Indicators: Other (Comment);Burning (stiffness) Pain Intervention(s): Limited activity within patient's tolerance    Home Living                          Prior Function            PT Goals (current goals can now be found in the care plan section) Acute Rehab PT Goals Patient Stated Goal: to return home PT Goal Formulation: With patient Time For Goal Achievement: 11/11/21 Potential to Achieve Goals: Good Progress towards PT goals: Progressing toward goals    Frequency    BID      PT Plan Current plan remains appropriate    Co-evaluation              AM-PAC PT "6 Clicks" Mobility   Outcome Measure  Help needed turning from your back to your side while in a flat bed without using bedrails?: A Little Help needed moving from lying on your back to sitting on the side of a flat bed without using bedrails?: A Little Help needed moving to and from a bed to a chair (including a wheelchair)?: A Little Help needed standing up from a chair using your arms (e.g., wheelchair or bedside chair)?: A Little Help needed to walk in hospital room?: A Little Help needed climbing 3-5 steps with a railing? : A Little 6 Click Score: 18    End of Session Equipment Utilized During Treatment: Gait belt Activity Tolerance: Patient tolerated treatment well Patient left: in bed;with call bell/phone within reach;with  bed alarm set;with SCD's reapplied (ice pack applied to left hip) Nurse Communication: Mobility status PT Visit Diagnosis: Pain;Muscle weakness (generalized) (M62.81);Other abnormalities of gait and mobility (R26.89) Pain - Right/Left: Left Pain - part of body: Hip     Time: 0347-4259 PT Time Calculation (min) (ACUTE ONLY): 71 min  Charges:  $Gait Training: 23-37 mins $Therapeutic Exercise: 23-37 mins $Therapeutic Activity: 8-22 mins                     Minna Merritts, PT, MPT    Percell Locus 10/30/2021, 10:53 AM

## 2021-10-31 DIAGNOSIS — M1612 Unilateral primary osteoarthritis, left hip: Secondary | ICD-10-CM | POA: Diagnosis not present

## 2021-10-31 LAB — GLUCOSE, CAPILLARY: Glucose-Capillary: 129 mg/dL — ABNORMAL HIGH (ref 70–99)

## 2021-10-31 LAB — BASIC METABOLIC PANEL
Anion gap: 4 — ABNORMAL LOW (ref 5–15)
BUN: 23 mg/dL — ABNORMAL HIGH (ref 6–20)
CO2: 27 mmol/L (ref 22–32)
Calcium: 8.4 mg/dL — ABNORMAL LOW (ref 8.9–10.3)
Chloride: 104 mmol/L (ref 98–111)
Creatinine, Ser: 1.37 mg/dL — ABNORMAL HIGH (ref 0.61–1.24)
GFR, Estimated: >60 mL/min (ref >60)
Glucose, Bld: 136 mg/dL — ABNORMAL HIGH (ref 70–99)
Potassium: 3.9 mmol/L (ref 3.5–5.1)
Sodium: 135 mmol/L (ref 135–145)

## 2021-10-31 NOTE — Plan of Care (Signed)

## 2021-10-31 NOTE — Progress Notes (Signed)
Subjective: 3 Days Post-Op Procedure(s) (LRB): TOTAL HIP ARTHROPLASTY (Left) Patient reports pain as mild in the left hip. Patient is well this morning, Foley has been placed due to the inability to urinate. Plan is to go Home after hospital stay. Negative for chest pain and shortness of breath Fever: no Gastrointestinal:Negative for nausea and vomiting  Objective: Vital signs in last 24 hours: Temp:  [98.4 F (36.9 C)-99.9 F (37.7 C)] 98.4 F (36.9 C) (12/23 0422) Pulse Rate:  [98-109] 100 (12/23 0422) Resp:  [16] 16 (12/23 0422) BP: (112-124)/(59-78) 124/78 (12/23 0422) SpO2:  [95 %-100 %] 100 % (12/23 0422)  Intake/Output from previous day:  Intake/Output Summary (Last 24 hours) at 10/31/2021 0801 Last data filed at 10/31/2021 0431 Gross per 24 hour  Intake 120 ml  Output 1650 ml  Net -1530 ml    Intake/Output this shift: No intake/output data recorded.  Labs: Recent Labs    10/29/21 0410 10/30/21 0339  HGB 9.2* 8.3*   Recent Labs    10/29/21 0410 10/30/21 0339  WBC 8.5 9.4  RBC 3.15* 2.86*  HCT 28.3* 25.7*  PLT 238 223   Recent Labs    10/30/21 0339 10/31/21 0416  NA 134* 135  K 3.9 3.9  CL 105 104  CO2 23 27  BUN 21* 23*  CREATININE 1.42* 1.37*  GLUCOSE 200* 136*  CALCIUM 8.4* 8.4*   No results for input(s): LABPT, INR in the last 72 hours.  EXAM General - Patient is Alert, Appropriate, and Oriented Extremity - ABD soft Neurovascular intact Dorsiflexion/Plantar flexion intact Incision: dressing C/D/I No cellulitis present Compartment soft Dressing/Incision - clean, dry, no drainage noted to the left hip. Motor Function - intact, moving foot and toes well on exam.  Abdomen soft with normal bowel sounds.  Belly is non-tender to palpation. Foley has been placed for the patient.  Past Medical History:  Diagnosis Date   Anemia    taking 3 iron pills a day   Anemia    Arthritis    Asthma    sports induced asthma. takes inhalers  when needed   Barrett's esophagus without dysplasia    Cancer (HCC)    Basal and squamoous cell   Chronic kidney disease    Complication of anesthesia    high tolerance to pain medication. body absorbs pain med quickly   Coronary artery disease    Cough on exercise    Diabetes mellitus without complication (HCC)    Diverticulosis    Erectile dysfunction    Fatty liver    GERD (gastroesophageal reflux disease)    Hemorrhoids    History of kidney stones    Hyperlipidemia    Hypertension    per patient, he does not have high bp but is treated for his kidneys and his diabetes   Pancreatitis    Pneumonia 2012   Right shoulder injury    Sleep apnea    use C-PAP   Wears hearing aid in both ears     Assessment/Plan: 3 Days Post-Op Procedure(s) (LRB): TOTAL HIP ARTHROPLASTY (Left) Principal Problem:   Status post total hip replacement, left  Estimated body mass index is 26.63 kg/m as calculated from the following:   Height as of this encounter: 5' 9.5" (1.765 m).   Weight as of this encounter: 83 kg. Advance diet Up with therapy   Labs reviewed this morning, Hg 8.3, WBC 9.4 Patient is passing gas without pain this morning and denies any nausea or vomiting.  Continue to work on BM. Will plan on discharge home with a foley and follow-up with his urologist outpatient. Plan on possible d/c home today with Foley intact.  DVT Prophylaxis - Foot Pumps, TED hose, and Eliquis Weight-Bearing as tolerated to left leg  J. Cameron Proud, PA-C [facility] 10/31/2021, 8:01 AM

## 2021-10-31 NOTE — Progress Notes (Signed)
Physical Therapy Treatment Patient Details Name: Jesse Alvarado MRN: [id number] DOB: 06-22-1966 Today's Date: 10/31/2021   History of Present Illness Jesse Alvarado is a 54yoM with degenerative joint disease of left hip. Patient is s/p left total hip arthroplasty, posterolateral approach. Past medical history of sleep apnea, diabetes, arthritis, anemia, right shoulder injury with ruptured biceps tendon, hypertension    PT Comments    Pt in be don arrival, agreeable to session. Pt able to progress AMB speed by >500%, distance to 134ft. Pt reports confidence in HEP, precautions, car transfers, self management at home. Pt mildly nauseated after session. ModI bed mobility and transfers c RW. Pt reports ready to DC to home, has me PT goals of treatment, will update frequency to reflect that. Pt reports no need to see PT again today before going home.     Recommendations for follow up therapy are one component of a multi-disciplinary discharge planning process, led by the attending physician.  Recommendations may be updated based on patient status, additional functional criteria and insurance authorization.  Follow Up Recommendations  Home health PT     Assistance Recommended at Discharge PRN  Equipment Recommendations  None recommended by PT    Recommendations for Other Services Rehab consult     Precautions / Restrictions Precautions Precautions: Posterior Hip;Fall Precaution Booklet Issued: Yes (comment) Restrictions Weight Bearing Restrictions: Yes LLE Weight Bearing: Weight bearing as tolerated     Mobility  Bed Mobility Overal bed mobility: Modified Independent Bed Mobility: Supine to Sit     Supine to sit: Modified independent (Device/Increase time)          Transfers Overall transfer level: Needs assistance Equipment used: Rolling walker (2 wheels) Transfers: Sit to/from Stand Sit to Stand: Modified independent (Device/Increase time);From elevated surface                 Ambulation/Gait Ambulation/Gait assistance: Supervision Gait Distance (Feet): 150 Feet Assistive device: Rolling walker (2 wheels) Gait Pattern/deviations: Step-through pattern Gait velocity: 0.71m/s this session (0.074m/s on 12/21) (>500% increase!)     General Gait Details: still focusing on achieving gait cues from previous day with transition from step-to to stepthrough 2-point pattern.   Stairs             Wheelchair Mobility    Modified Rankin (Stroke Patients Only)       Balance                                            Cognition Arousal/Alertness: Awake/alert Behavior During Therapy: WFL for tasks assessed/performed Overall Cognitive Status: Within Functional Limits for tasks assessed                                          Exercises      General Comments        Pertinent Vitals/Pain Pain Assessment: 0-10 Pain Score: 4  Pain Location: left hip Pain Descriptors / Indicators: Aching Pain Intervention(s): Limited activity within patient's tolerance;Monitored during session;Premedicated before session;Repositioned    Home Living                          Prior Function            PT Goals (current goals  can now be found in the care plan section) Acute Rehab PT Goals Patient Stated Goal: to return home PT Goal Formulation: With patient Time For Goal Achievement: 11/11/21 Potential to Achieve Goals: Good Progress towards PT goals: Progressing toward goals    Frequency    7X/week      PT Plan Current plan remains appropriate;Frequency needs to be updated    Co-evaluation              AM-PAC PT "6 Clicks" Mobility   Outcome Measure  Help needed turning from your back to your side while in a flat bed without using bedrails?: A Little Help needed moving from lying on your back to sitting on the side of a flat bed without using bedrails?: A Little Help needed moving  to and from a bed to a chair (including a wheelchair)?: A Little Help needed standing up from a chair using your arms (e.g., wheelchair or bedside chair)?: A Little Help needed to walk in hospital room?: A Little Help needed climbing 3-5 steps with a railing? : A Little 6 Click Score: 18    End of Session Equipment Utilized During Treatment: Gait belt Activity Tolerance: Patient tolerated treatment well;No increased pain Patient left: with call bell/phone within reach;in chair Nurse Communication: Mobility status PT Visit Diagnosis: Pain;Muscle weakness (generalized) (M62.81);Other abnormalities of gait and mobility (R26.89) Pain - Right/Left: Left Pain - part of body: Hip     Time: 0927-0956 PT Time Calculation (min) (ACUTE ONLY): 29 min  Charges:  $Gait Training: 23-37 mins                    10:30 AM, 10/31/21 Etta Grandchild, PT, DPT Physical Therapist - [facility]  [phone] ([place])    Anderson C 10/31/2021, 10:25 AM

## 2021-11-01 ENCOUNTER — Other Ambulatory Visit (HOSPITAL_BASED_OUTPATIENT_CLINIC_OR_DEPARTMENT_OTHER): Payer: Self-pay | Admitting: Internal Medicine

## 2021-11-02 ENCOUNTER — Emergency Department: Payer: Managed Care, Other (non HMO)

## 2021-11-02 ENCOUNTER — Other Ambulatory Visit: Payer: Self-pay

## 2021-11-02 ENCOUNTER — Emergency Department
Admission: EM | Admit: 2021-11-02 | Discharge: 2021-11-02 | Disposition: A | Discharge status: Home / Self Care | Payer: Managed Care, Other (non HMO) | Attending: Emergency Medicine | Admitting: Emergency Medicine

## 2021-11-02 ENCOUNTER — Encounter: Payer: Self-pay | Admitting: Emergency Medicine

## 2021-11-02 DIAGNOSIS — M79605 Pain in left leg: Secondary | ICD-10-CM

## 2021-11-02 DIAGNOSIS — I129 Hypertensive chronic kidney disease with stage 1 through stage 4 chronic kidney disease, or unspecified chronic kidney disease: Secondary | ICD-10-CM | POA: Insufficient documentation

## 2021-11-02 DIAGNOSIS — Z96642 Presence of left artificial hip joint: Secondary | ICD-10-CM | POA: Insufficient documentation

## 2021-11-02 DIAGNOSIS — J45909 Unspecified asthma, uncomplicated: Secondary | ICD-10-CM | POA: Diagnosis not present

## 2021-11-02 DIAGNOSIS — R338 Other retention of urine: Secondary | ICD-10-CM

## 2021-11-02 DIAGNOSIS — E1169 Type 2 diabetes mellitus with other specified complication: Secondary | ICD-10-CM | POA: Insufficient documentation

## 2021-11-02 DIAGNOSIS — R339 Retention of urine, unspecified: Secondary | ICD-10-CM | POA: Diagnosis not present

## 2021-11-02 DIAGNOSIS — R0602 Shortness of breath: Secondary | ICD-10-CM | POA: Diagnosis not present

## 2021-11-02 DIAGNOSIS — I2510 Atherosclerotic heart disease of native coronary artery without angina pectoris: Secondary | ICD-10-CM | POA: Insufficient documentation

## 2021-11-02 DIAGNOSIS — Z85828 Personal history of other malignant neoplasm of skin: Secondary | ICD-10-CM | POA: Diagnosis not present

## 2021-11-02 DIAGNOSIS — E785 Hyperlipidemia, unspecified: Secondary | ICD-10-CM | POA: Diagnosis not present

## 2021-11-02 DIAGNOSIS — N189 Chronic kidney disease, unspecified: Secondary | ICD-10-CM | POA: Insufficient documentation

## 2021-11-02 DIAGNOSIS — E1122 Type 2 diabetes mellitus with diabetic chronic kidney disease: Secondary | ICD-10-CM | POA: Insufficient documentation

## 2021-11-02 LAB — CBC WITH DIFFERENTIAL/PLATELET
Abs Immature Granulocytes: 0.04 10*3/uL (ref 0.00–0.07)
Basophils Absolute: 0 10*3/uL (ref 0.0–0.1)
Basophils Relative: 0 %
Eosinophils Absolute: 0.1 10*3/uL (ref 0.0–0.5)
Eosinophils Relative: 1 %
HCT: 24.3 % — ABNORMAL LOW (ref 39.0–52.0)
Hemoglobin: 8 g/dL — ABNORMAL LOW (ref 13.0–17.0)
Immature Granulocytes: 1 %
Lymphocytes Relative: 30 %
Lymphs Abs: 2.5 10*3/uL (ref 0.7–4.0)
MCH: 29.5 pg (ref 26.0–34.0)
MCHC: 32.9 g/dL (ref 30.0–36.0)
MCV: 89.7 fL (ref 80.0–100.0)
Monocytes Absolute: 0.9 10*3/uL (ref 0.1–1.0)
Monocytes Relative: 11 %
Neutro Abs: 4.8 10*3/uL (ref 1.7–7.7)
Neutrophils Relative %: 57 %
Platelets: 376 10*3/uL (ref 150–400)
RBC: 2.71 MIL/uL — ABNORMAL LOW (ref 4.22–5.81)
RDW: 14.3 % (ref 11.5–15.5)
WBC: 8.4 10*3/uL (ref 4.0–10.5)
nRBC: 0.2 % (ref 0.0–0.2)

## 2021-11-02 LAB — COMPREHENSIVE METABOLIC PANEL
ALT: 20 U/L (ref 0–44)
AST: 34 U/L (ref 15–41)
Albumin: 2.9 g/dL — ABNORMAL LOW (ref 3.5–5.0)
Alkaline Phosphatase: 26 U/L — ABNORMAL LOW (ref 38–126)
Anion gap: 10 (ref 5–15)
BUN: 34 mg/dL — ABNORMAL HIGH (ref 6–20)
CO2: 24 mmol/L (ref 22–32)
Calcium: 9.3 mg/dL (ref 8.9–10.3)
Chloride: 97 mmol/L — ABNORMAL LOW (ref 98–111)
Creatinine, Ser: 1.53 mg/dL — ABNORMAL HIGH (ref 0.61–1.24)
GFR, Estimated: 53 mL/min — ABNORMAL LOW (ref >60)
Glucose, Bld: 210 mg/dL — ABNORMAL HIGH (ref 70–99)
Potassium: 3.5 mmol/L (ref 3.5–5.1)
Sodium: 131 mmol/L — ABNORMAL LOW (ref 135–145)
Total Bilirubin: 1 mg/dL (ref 0.3–1.2)
Total Protein: 6.9 g/dL (ref 6.5–8.1)

## 2021-11-02 LAB — URINALYSIS, COMPLETE (UACMP) WITH MICROSCOPIC
Bacteria, UA: NONE SEEN
Bilirubin Urine: NEGATIVE
Glucose, UA: >1000 mg/dL — AB
Ketones, ur: NEGATIVE mg/dL
Leukocytes,Ua: NEGATIVE
Nitrite: NEGATIVE
Protein, ur: NEGATIVE mg/dL
Specific Gravity, Urine: <1.005 — ABNORMAL LOW (ref 1.005–1.030)
Squamous Epithelial / HPF: NONE SEEN (ref 0–5)
pH: 5.5 (ref 5.0–8.0)

## 2021-11-02 LAB — TROPONIN I (HIGH SENSITIVITY)
Troponin I (High Sensitivity): 54 ng/L — ABNORMAL HIGH (ref <18)
Troponin I (High Sensitivity): 50 ng/L — ABNORMAL HIGH (ref <18)

## 2021-11-02 LAB — BRAIN NATRIURETIC PEPTIDE: B Natriuretic Peptide: 13.1 pg/mL (ref 0.0–100.0)

## 2021-11-02 LAB — D-DIMER, QUANTITATIVE: D-Dimer, Quant: 3.06 ug/mL-FEU — ABNORMAL HIGH (ref 0.00–0.50)

## 2021-11-02 MED ORDER — HYDROMORPHONE HCL 1 MG/ML IJ SOLN
1.0000 mg | Freq: Once | INTRAMUSCULAR | Status: AC
  Administered 2021-11-02: 20:00:00 1 mg via INTRAVENOUS
  Filled 2021-11-02: qty 1

## 2021-11-02 MED ORDER — SODIUM CHLORIDE 0.9 % IV BOLUS
1000.0000 mL | Freq: Once | INTRAVENOUS | Status: AC
  Administered 2021-11-02: 17:00:00 1000 mL via INTRAVENOUS

## 2021-11-02 MED ORDER — MORPHINE SULFATE (PF) 4 MG/ML IV SOLN
6.0000 mg | Freq: Once | INTRAVENOUS | Status: AC
  Administered 2021-11-02: 17:00:00 6 mg via INTRAVENOUS
  Filled 2021-11-02: qty 2

## 2021-11-02 MED ORDER — IOHEXOL 350 MG/ML SOLN
75.0000 mL | Freq: Once | INTRAVENOUS | Status: AC | PRN
  Administered 2021-11-02: 18:00:00 75 mL via INTRAVENOUS

## 2021-11-02 MED ORDER — LIDOCAINE HCL URETHRAL/MUCOSAL 2 % EX GEL
1.0000 "application " | Freq: Once | CUTANEOUS | Status: AC
  Administered 2021-11-02: 1 via URETHRAL
  Filled 2021-11-02: qty 10

## 2021-11-02 MED ORDER — ONDANSETRON HCL 4 MG/2ML IJ SOLN
4.0000 mg | Freq: Once | INTRAMUSCULAR | Status: AC
  Administered 2021-11-02: 17:00:00 4 mg via INTRAVENOUS
  Filled 2021-11-02: qty 2

## 2021-11-02 NOTE — ED Notes (Signed)
Urinary catheter leg bag placed by Sharyn Lull, RN and assisted by Lenna Sciara, RN

## 2021-11-02 NOTE — ED Triage Notes (Signed)
Pt via POV from home. Pt had a L total hip replacement on Tuesday. Pt states that now he noticed L knee swelling last night, also painful. Pt also endorses SOB that started last night. Denies CP.

## 2021-11-02 NOTE — ED Notes (Signed)
Urine sent to lab. Patient able to void 150 ml in urinal - pt reported he was able to position himself on the side of the bed to urinate. Did bladder scan and pt had 572 ml in bladder- verified twice. Asked pt if felt like he had to urinate and he denied any feeling of needing to urinate. Denies any abdominal or bladder pain. Let pt know I would inform MD.

## 2021-11-02 NOTE — ED Notes (Signed)
Honeycomb dressing placed on left hip by Dr. Tamala Julian and timed and dated and labeled by nurse.

## 2021-11-02 NOTE — ED Provider Notes (Signed)
[facility] Emergency Department Provider Note ____________________________________________   Event Date/Time   First MD Initiated Contact with Patient 11/02/21 1542     (approximate)  I have reviewed the triage vital signs and the nursing notes.  HISTORY  Chief Complaint Shortness of Breath   HPI Jesse Alvarado is a 55 y.o. malewho presents to the ED for evaluation of SOB.   Chart review indicates 12/20 total hip arthroplasty on the left. Started on prophylactic Eliquis.  Has indwelling Foley catheter due to postoperative urinary retention Otherwise history of HTN, HLD, DM I review outpatient cardiology visit from a couple months ago.  He previously had an exercise stress test with some transient ST depressions and CT with coronary calcium.   Patient presents to the ED, accompanied by his wife, for evaluation of multiple complaints.  Reporting left knee pain and swelling, burning at the tip of his penis and shortness of breath.  He reports pain to his left hip, left thigh and knee.  Denies falls, trauma or injuries.  Reports swelling and feeling of warmth on his left leg to the area of pain.  Reports moderately severe intensity pain that is constant, aching and only transient and improved with home narcotics.  Reports dyspnea over the past 1 day without cough or chest pain.  Reports feeling short of breath now while supine, and worse with exertion.  Reports indwelling Foley catheter is uncomfortable with occasional blood clots coming down the tube and increasing burning at the tip of his penis and reports concern for UTI.  Reports that every time he has had a catheter in the past he gets an infection.   Past Medical History:  Diagnosis Date   Anemia    taking 3 iron pills a day   Anemia    Arthritis    Asthma    sports induced asthma. takes inhalers when needed   Barrett's esophagus without dysplasia    Cancer (HCC)    Basal and squamoous cell    Chronic kidney disease    Complication of anesthesia    high tolerance to pain medication. body absorbs pain med quickly   Coronary artery disease    Cough on exercise    Diabetes mellitus without complication (HCC)    Diverticulosis    Erectile dysfunction    Fatty liver    GERD (gastroesophageal reflux disease)    Hemorrhoids    History of kidney stones    Hyperlipidemia    Hypertension    per patient, he does not have high bp but is treated for his kidneys and his diabetes   Pancreatitis    Pneumonia 2012   Right shoulder injury    Sleep apnea    use C-PAP   Wears hearing aid in both ears     Patient Active Problem List   Diagnosis Date Noted   Status post total hip replacement, left 10/28/2021   Dyslipidemia 08/24/2021   Status post colonoscopy 06/02/2019   Hepatic steatosis 06/02/2019   Barrett's esophagus without dysplasia 01/19/2019   High coronary artery calcium score 12/29/2018   Chronic iron deficiency anemia 12/22/2017   Gastritis without bleeding 06/25/2017   Vitamin B12 deficiency 08/03/2016   OSA on CPAP 04/18/2016   Nephrolithiasis 04/13/2016   Chronic midline low back pain without sciatica 01/25/2016   Primary osteoarthritis involving multiple joints 11/20/2015   Bilateral carpal tunnel syndrome 09/14/2015   Essential hypertension 09/14/2015   Chronic knee pain 08/30/2014   Erectile  dysfunction 08/30/2014   Other allergic rhinitis 08/30/2014   Onychomycosis of toenail 07/24/2014   Hyperlipidemia associated with type 2 diabetes mellitus ([place]) 04/23/2014   Type II diabetes mellitus with complication ([place]) 75/17/0017    Past Surgical History:  Procedure Laterality Date   ANKLE GANGLION CYST EXCISION Left    x 5   CARPAL TUNNEL RELEASE Right 10/03/2018   Procedure: CARPAL TUNNEL RELEASE;  Surgeon: Earnestine Leys, MD;  Location: [facility];  Service: Orthopedics;  Laterality: Right;   CARPAL TUNNEL RELEASE Left 10/24/2018   Procedure: CARPAL TUNNEL  RELEASE;  Surgeon: Earnestine Leys, MD;  Location: [facility];  Service: Orthopedics;  Laterality: Left;   COLONOSCOPY     COLONOSCOPY WITH PROPOFOL N/A 08/30/2019   Procedure: COLONOSCOPY WITH PROPOFOL;  Surgeon: Toledo, Benay Pike, MD;  Location: [facility];  Service: Gastroenterology;  Laterality: N/A;   CYSTOSCOPY WITH STENT PLACEMENT Bilateral 06/05/2016   Procedure: CYSTOSCOPY WITH STENT PLACEMENT;  Surgeon: Nickie Retort, MD;  Location: [facility];  Service: Urology;  Laterality: Bilateral;   CYSTOSCOPY/URETEROSCOPY/HOLMIUM LASER/STENT PLACEMENT Left 04/13/2016   Procedure: CYSTOSCOPY/RETROGRADE PYELOGRAM/URETEROSCOPY WITH HOLMIUM LASER LITHOTRIPSY//STENT PLACEMENT;  Surgeon: Festus Aloe, MD;  Location: [facility];  Service: Urology;  Laterality: Left;   ESOPHAGOGASTRODUODENOSCOPY (EGD) WITH PROPOFOL N/A 12/27/2015   Procedure: ESOPHAGOGASTRODUODENOSCOPY (EGD) WITH PROPOFOL;  Surgeon: Manya Silvas, MD;  Location: [facility];  Service: Endoscopy;  Laterality: N/A;   ESOPHAGOGASTRODUODENOSCOPY (EGD) WITH PROPOFOL N/A 08/30/2019   Procedure: ESOPHAGOGASTRODUODENOSCOPY (EGD) WITH PROPOFOL;  Surgeon: Toledo, Benay Pike, MD;  Location: [facility];  Service: Gastroenterology;  Laterality: N/A;   EXTRACORPOREAL SHOCK WAVE LITHOTRIPSY Right 12/30/2017   Procedure: EXTRACORPOREAL SHOCK WAVE LITHOTRIPSY (ESWL);  Surgeon: Royston Cowper, MD;  Location: [facility];  Service: Urology;  Laterality: Right;   HERNIA REPAIR  4944   umbilical   KNEE ARTHROSCOPY Right 2015   had bursa sack repaired   KNEE ARTHROSCOPY WITH MEDIAL MENISECTOMY Right 10/11/2020   Procedure: Right knee arthroscopy partial medial meniscectomy;  Surgeon: Leim Fabry, MD;  Location: [place];  Service: Orthopedics;  Laterality: Right;  Diabetic - oral meds sleep apnea   KNEE ARTHROSCOPY WITH MENISCAL REPAIR Right 11/18/2018   Procedure: KNEE ARTHROSCOPY WITH MENISCAL REPAIR AND CHONDROPLASTY;  Surgeon:  Leim Fabry, MD;  Location: [place];  Service: Orthopedics;  Laterality: Right;  Diabetic - oral meds sleep apnea SUPINE WITH ACL LEG HOLDER Sharma Lawrance AND NEWPHEW CETERIX   SHOULDER SURGERY Right 2011   tendon was shredded and was trimmed, repaired and reattached. Screws in shoulder   TOTAL HIP ARTHROPLASTY Left 10/28/2021   Procedure: TOTAL HIP ARTHROPLASTY;  Surgeon: Corky Mull, MD;  Location: [facility];  Service: Orthopedics;  Laterality: Left;   URETEROSCOPY WITH HOLMIUM LASER LITHOTRIPSY Bilateral 06/05/2016   Procedure: URETEROSCOPY WITH HOLMIUM LASER LITHOTRIPSY;  Surgeon: Nickie Retort, MD;  Location: [facility];  Service: Urology;  Laterality: Bilateral;   VASECTOMY  2002    Prior to Admission medications   Medication Sig Start Date End Date Taking? Authorizing Provider  acetaminophen (TYLENOL) 500 MG tablet Take 2 tablets (1,000 mg total) by mouth every 6 (six) hours. 10/29/21   Lattie Corns, PA-C  albuterol (VENTOLIN HFA) 108 (90 Base) MCG/ACT inhaler Inhale 2 puffs into the lungs every 6 (six) hours as needed for wheezing or shortness of breath.    [provider]  amitriptyline (ELAVIL) 25 MG tablet Take 25 mg by mouth at bedtime as needed. Recurring Cough- Dr  Joya Gaskins- Patient not taking: Reported on 10/15/2021 08/30/18   [provider]  apixaban (ELIQUIS) 2.5 MG TABS tablet Take 1 tablet (2.5 mg total) by mouth 2 (two) times daily. 10/29/21   Lattie Corns, PA-C  atorvastatin (LIPITOR) 80 MG tablet TAKE ONE TABLET BY MOUTH ONE TIME DAILY Patient taking differently: Take 80 mg by mouth every morning. 07/30/21   Hilty, Nadean Corwin, MD  azelastine (OPTIVAR) 0.05 % ophthalmic solution Place 1 drop into both eyes daily as needed (allergies).    [provider]  Azelastine-Fluticasone (DYMISTA) 137-50 MCG/ACT SUSP Place 1 spray into both nostrils 2 (two) times daily.     [provider]  benzonatate (TESSALON) 200 MG capsule  Take 200 mg by mouth 3 (three) times daily as needed for cough.    [provider]  calcium citrate-vitamin D (CITRACAL+D) 315-200 MG-UNIT tablet Take 1 tablet by mouth in the morning, at noon, and at bedtime.    [provider]  ciclopirox (PENLAC) 8 % solution Apply 1 application topically at bedtime. Apply over nail and surrounding skin. Apply daily over previous coat. After seven (7) days, may remove with alcohol and continue cycle. 06/02/19   Glean Hess, MD  Continuous Blood Gluc Sensor (FREESTYLE LIBRE 2 SENSOR) MISC USE 1 KIT EVERY 14 DAYS FOR GLUCOSE MONITORING 04/24/21   [provider]  cyanocobalamin (,VITAMIN B-12,) 1000 MCG/ML injection ADMINISTER 1 ML(1000 MCG) IN THE MUSCLE EVERY 30 DAYS 10/14/20   Glean Hess, MD  dapagliflozin propanediol (FARXIGA) 10 MG TABS tablet Take 10 mg by mouth daily.    [provider]  ezetimibe (ZETIA) 10 MG tablet TAKE 1 TABLET(10 MG) BY MOUTH DAILY Patient taking differently: Take 10 mg by mouth every morning. 10/22/20   Hilty, Nadean Corwin, MD  fenofibrate (TRICOR) 145 MG tablet Take 1 tablet (145 mg total) by mouth daily. Patient taking differently: Take 145 mg by mouth every morning. 11/06/19   Glean Hess, MD  ferrous gluconate (FERGON) 324 MG tablet Take 324 mg by mouth 3 (three) times daily with meals. 10/21/20   [provider]  folic acid (FOLVITE) 1 MG tablet TAKE 1 TABLET(1 MG) BY MOUTH DAILY 07/25/20   Glean Hess, MD  gabapentin (NEURONTIN) 300 MG capsule Take 300 mg by mouth 3 (three) times daily. For chronic cough/laryngeal inflammation 02/02/20   [provider]  glimepiride (AMARYL) 2 MG tablet Take 4 tablets (8 mg total) by mouth See admin instructions. Take 52m with breakfast, 243mwith lunch, and 27m46mith supper. Patient taking differently: Take 2 mg by mouth 2 (two) times daily. One in the morning and one in the evening 06/02/19   BerGlean HessD  glucose blood  (PRECISION QID TEST) test strip Use once daily Use as instructed.    [provider]  icosapent Ethyl (VASCEPA) 1 g capsule TAKE TWO CAPSULES BY MOUTH TWICE A DAY 07/30/21   Hilty, KenNadean CorwinD  ipratropium (ATROVENT) 0.06 % nasal spray Place 2 sprays into both nostrils 2 (two) times daily.  03/11/18   [provider]  ketoconazole (NIZORAL) 2 % shampoo WASH SCALP EVERY OTHER WASHING ALTERNATING WITH HEAD AND SHOULDERS. LET SIT SEVERAL MINUTES BEFORE RINSING. 04/21/19   [provider]  levocetirizine (XYZAL) 5 MG tablet Take 5 mg by mouth every evening.  03/25/16   [provider]  losartan (COZAAR) 50 MG tablet Take 50 mg by mouth every morning. 03/13/21   [provider]  metFORMIN (GLUCOPHAGE) 850 MG tablet TAKE 1 TABLET(850 MG) BY MOUTH THREE TIMES DAILY WITH MEALS 06/18/20   Glean Hess, MD  ondansetron (ZOFRAN ODT) 4 MG disintegrating tablet Take 1 tablet (4 mg total) by mouth every 8 (eight) hours as needed for nausea or vomiting. 10/11/20   Leim Fabry, MD  oxyCODONE (OXY IR/ROXICODONE) 5 MG immediate release tablet Take 1-2 tablets (5-10 mg total) by mouth every 4 (four) hours as needed for severe pain. 10/29/21   Lattie Corns, PA-C  pantoprazole (PROTONIX) 40 MG tablet Take 1 tablet (40 mg total) by mouth 2 (two) times daily. 08/26/20   Glean Hess, MD  pioglitazone (ACTOS) 15 MG tablet Take 15 mg by mouth daily. 10/16/20   [provider]  silodosin (RAPAFLO) 4 MG CAPS capsule Take 4 mg by mouth every evening. 08/20/20   [provider]  sucralfate (CARAFATE) 1 g tablet TAKE 1 TABLET(1 GRAM) BY MOUTH THREE TIMES DAILY WITH MEALS 08/14/20   Glean Hess, MD  tadalafil (CIALIS) 20 MG tablet Take 20 mg by mouth daily as needed for erectile dysfunction. 03/13/21   [provider]  tiZANidine (ZANAFLEX) 2 MG tablet Take 2 mg by mouth at bedtime as needed for muscle spasms.    [provider]  traMADol  (ULTRAM) 50 MG tablet Take 1 tablet (50 mg total) by mouth every 6 (six) hours as needed for moderate pain. 10/29/21   Lattie Corns, PA-C  vitamin C (ASCORBIC ACID) 500 MG tablet Take 500 mg by mouth daily.    [provider]    Allergies Dulaglutide, Monosodium glutamate, and Ciprofloxacin  Family History  Problem Relation Age of Onset   Urolithiasis Father    Kidney disease Father    Prostate cancer Neg Hx    Kidney cancer Neg Hx     Social History Social History   Tobacco Use   Smoking status: Never   Smokeless tobacco: Never  Vaping Use   Vaping Use: Never used  Substance Use Topics   Alcohol use: No    Comment: very rare etoh ([place]olid[place])   Drug use: No    Review of Systems  Constitutional: No fever/chills Eyes: No visual changes. ENT: No sore throat. Cardiovascular: Denies chest pain. Respiratory: Positive for shortness of breath. Gastrointestinal: No abdominal pain.  Nno vomiting.  No diarrhea.  No constipation. Positive for nausea Genitourinary: Negative for dysuria.Marland Kitchen Positive for burning at the tip of his penis and blood clots in the Foley Musculoskeletal: Negative for back pain. Positive for atraumatic left leg pain. Skin: Negative for rash. Neurological: Negative for headaches, focal weakness or numbness. ____________________________________________   PHYSICAL EXAM:  VITAL SIGNS: Vitals:   11/02/21 2000 11/02/21 2030  BP: 114/73 109/74  Pulse: 89 88  Resp:  20  Temp:    SpO2: 93% 95%     Constitutional: Alert and oriented. Well appearing and in no acute distress.  Appears clinically well.  Conversational in full sentences without distress or dyspnea Eyes: Conjunctivae are normal. PERRL. EOMI. Head: Atraumatic. Nose: No congestion/rhinnorhea. Mouth/Throat: Mucous membranes are moist.  Oropharynx non-erythematous. Neck: No stridor. No cervical spine tenderness to palpation. Cardiovascular: Tachycardic rate, regular rhythm. Good  peripheral circulation. Respiratory: Minimal tachypnea to the low 20s.  No further evidence of distress..  No retractions. Lungs CTAB. Gastrointestinal: Soft , nondistended,  Mild suprapubic tenderness without peritoneal features.  Abdomen is otherwise benign..  Foley catheter draining somewhat cloudy yellow urine Musculoskeletal: Clean  honeycomb bandage over his left lateral hip surgical site that I removed and demonstrates clean incision without erythema, dehiscence or purulence. His inner left thigh has some bruising.  After removing his pants, his left leg does look asymmetric compared to the right overall with slightly increased swelling, increased skin coloration/mild flat erythema pretty diffusely over his anterior and lateral thigh and knee. Left leg feels somewhat warm over these areas, but not much asymmetry compared to the right where the skin feels somewhat warm overall. Full passive range of motion to the left leg with some mild pain at the left hip.  No pain with passive ranging of his left knee.  Left foot is distally neurovascularly intact. No other joint pains Neurologic:  Normal speech and language. No gross focal neurologic deficits are appreciated. No gait instability noted. Skin:  Skin is warm, dry and intact. No rash noted. Psychiatric: Mood and affect are normal. Speech and behavior are normal.  ____________________________________________   LABS (all labs ordered are listed, but only abnormal results are displayed)  Labs Reviewed  COMPREHENSIVE METABOLIC PANEL - Abnormal; Notable for the following components:      Result Value   Sodium 131 (*)    Chloride 97 (*)    Glucose, Bld 210 (*)    BUN 34 (*)    Creatinine, Ser 1.53 (*)    Albumin 2.9 (*)    Alkaline Phosphatase 26 (*)    GFR, Estimated 53 (*)    All other components within normal limits  CBC WITH DIFFERENTIAL/PLATELET - Abnormal; Notable for the following components:   RBC 2.71 (*)    Hemoglobin 8.0  (*)    HCT 24.3 (*)    All other components within normal limits  URINALYSIS, COMPLETE (UACMP) WITH MICROSCOPIC - Abnormal; Notable for the following components:   Specific Gravity, Urine <1.005 (*)    Glucose, UA >1,000 (*)    Hgb urine dipstick SMALL (*)    All other components within normal limits  D-DIMER, QUANTITATIVE - Abnormal; Notable for the following components:   D-Dimer, Quant 3.06 (*)    All other components within normal limits  TROPONIN I (HIGH SENSITIVITY) - Abnormal; Notable for the following components:   Troponin I (High Sensitivity) 54 (*)    All other components within normal limits  TROPONIN I (HIGH SENSITIVITY) - Abnormal; Notable for the following components:   Troponin I (High Sensitivity) 50 (*)    All other components within normal limits  BRAIN NATRIURETIC PEPTIDE   ____________________________________________  12 Lead EKG  Sinus tachycardia with a rate of 105 bpm.  Normal axis and intervals.  No evidence of acute ischemia. ____________________________________________  RADIOLOGY  ED MD interpretation: 2 view CXR reviewed by me with left basilar platelike atelectasis  Official radiology report(s): DG Chest 2 View  Result Date: 11/02/2021 CLINICAL DATA:  post op dyspnea and tachy. eval infiltrate EXAM: CHEST - 2 VIEW COMPARISON:  July 30, 2018, September 07, 2021 FINDINGS: The cardiomediastinal silhouette is unchanged and mildly enlarged in contour. No pleural effusion. No pneumothorax. Basilar platelike opacities. Visualized abdomen is unremarkable. Multilevel degenerative changes of the thoracic spine. IMPRESSION: LEFT basilar platelike opacities, likely atelectasis. Superimposed infection remains in the differential. Electronically Signed   By: Valentino Saxon M.D.   On: 11/02/2021 16:29   CT Angio Chest PE W and/or Wo Contrast  Result Date: 11/02/2021 CLINICAL DATA:  Shortness of breath. Status post left hip replacement. EXAM: CT ANGIOGRAPHY  CHEST WITH CONTRAST TECHNIQUE: Multidetector  CT imaging of the chest was performed using the standard protocol during bolus administration of intravenous contrast. Multiplanar CT image reconstructions and MIPs were obtained to evaluate the vascular anatomy. CONTRAST:  67m OMNIPAQUE IOHEXOL 350 MG/ML SOLN COMPARISON:  07/30/2018. FINDINGS: Cardiovascular: The heart size is normal. No substantial pericardial effusion. Coronary artery calcification is evident. Mild atherosclerotic calcification is noted in the wall of the thoracic aorta. There is no filling defect within the opacified pulmonary arteries to suggest the presence of an acute pulmonary embolus. Mediastinum/Nodes: No mediastinal lymphadenopathy. There is no hilar lymphadenopathy. The esophagus has normal imaging features. There is no axillary lymphadenopathy. Lungs/Pleura: Dependent atelectasis noted in the lower lobes. No focal airspace consolidation. No pulmonary edema or pleural effusion. Tiny peripheral calcified nodule in the right upper lobe (37/6) is stable in the interval consistent with benign etiology. Upper Abdomen: The liver shows diffusely decreased attenuation suggesting fat deposition. Musculoskeletal: Review of the MIP images confirms the above findings. IMPRESSION: 1. No CT evidence for acute pulmonary embolus. 2. Dependent atelectasis in the lower lobes bilaterally. 3. Hepatic steatosis. 4. Aortic Atherosclerosis (ICD10-I70.0). Electronically Signed   By: EMisty StanleyM.D.   On: 11/02/2021 18:04   UKoreaVenous Img Lower Unilateral Left  Result Date: 11/02/2021 CLINICAL DATA:  4 days post op from left hip. pain, swelling, discoloration. eval DVT EXAM: LEFT LOWER EXTREMITY VENOUS DOPPLER ULTRASOUND TECHNIQUE: Gray-scale sonography with compression, as well as color and duplex ultrasound, were performed to evaluate the deep venous system(s) from the level of the common femoral vein through the popliteal and proximal calf veins.  COMPARISON:  None. FINDINGS: VENOUS Normal compressibility of the common femoral, superficial femoral, and popliteal veins, as well as the visualized calf veins. Visualized portions of profunda femoral vein and great saphenous vein unremarkable. No filling defects to suggest DVT on grayscale or color Doppler imaging. Doppler waveforms show normal direction of venous flow, normal respiratory plasticity and response to augmentation. Limited views of the contralateral common femoral vein are unremarkable. OTHER Subcutaneous edema. Limitations: none IMPRESSION: Negative. Electronically Signed   By: SValentino SaxonM.D.   On: 11/02/2021 16:50    ____________________________________________   PROCEDURES and INTERVENTIONS  Procedure(s) performed (including Critical Care):  .1-3 Lead EKG Interpretation Performed by: SVladimir Crofts MD Authorized by: SVladimir Crofts MD     Interpretation: abnormal     ECG rate:  103   ECG rate assessment: tachycardic     Rhythm: sinus tachycardia     Ectopy: none     Conduction: normal    Medications  lidocaine (XYLOCAINE) 2 % jelly 1 application (has no administration in time range)  sodium chloride 0.9 % bolus 1,000 mL (0 mLs Intravenous Stopped 11/02/21 1859)  morphine 4 MG/ML injection 6 mg (6 mg Intravenous Given 11/02/21 1646)  ondansetron (ZOFRAN) injection 4 mg (4 mg Intravenous Given 11/02/21 1646)  iohexol (OMNIPAQUE) 350 MG/ML injection 75 mL (75 mLs Intravenous Contrast Given 11/02/21 1748)  HYDROmorphone (DILAUDID) injection 1 mg (1 mg Intravenous Given 11/02/21 2026)    ____________________________________________   MDM / ED COURSE   55year old male presents to the ED with left leg pain few days after a total hip replacement without evidence of significant acute pathology, and ultimately amenable to return to outpatient management.  He looks clinically well and has mild swelling throughout the left leg.  Vision looks good without evidence of  dehiscence or postop infection.  Some mild flat erythema and bruising to the thigh more distally from  the incision that I do not anticipate represent cellulitis, likely lymphedema and settling bruising after his surgery.  Blood work shows CKD around baseline and stable hemoglobin.  CXR without infiltrates or edema.  His dimer is positive and so CTA chest and venous ultrasound of that left leg were performed without evidence of DVT or PE.  No signs of pneumonia on that CT chest.  His EKG is nonischemic and troponins are slightly elevated, but flat and downtrending.  No chest pain.  Considering his known coronary calcium and abnormal stress test as an outpatient, anticipate these troponin values are not unusual for the patient and I have no comparison in the chart for them.  Overall, I do not see any concerning acute pathology.  I see no indications to initiate antibiotics.   Regarding his urinary retention.  He was discharged with a Foley catheter in place and requests we try voiding trial because he is supposed to have it taken out a couple days anyways.  We remove his Foley and he is unable to effectively empty his bladder.  Indwelling Foley catheter replaced.  UA without infectious features.  We will discharge with close follow-up with both Ortho and cardiology.  Return precautions for the ED discussed.  Clinical Course as of 11/02/21 2145  Nancy Fetter Nov 02, 2021  1620 Discussed plan of care with patient and wife.  We discussed possible etiologies of his symptoms such as acute cystitis, DVT, PE, pneumonia or cellulitis.  We discussed work-up and he is agreeable [DS]  1846 Reassessed and discussed fairly benign work-up so far.  Discussed need for UA and he reports having some difficulty voiding after the Foley was taken out.  We discussed the possibility of requiring replacement of his Foley and he expresses understanding and agreement. [DS]  2049 Patient able to void 100 cc, but postvoid residuals greater  than 500.  We will have to replace his Foley catheter [DS]  2133 Reassessed.  We discussed largely benign work-up of flat troponins.  We discussed cardiopulmonary symptoms again, he again denies chest pain. [DS]    Clinical Course User Index [DS] Vladimir Crofts, MD    ____________________________________________   FINAL CLINICAL IMPRESSION(S) / ED DIAGNOSES  Final diagnoses:  Acute urinary retention  Left leg pain  Shortness of breath     ED Discharge Orders     None        Kord Monette Tamala Julian   Note:  This document was prepared using Dragon voice recognition software and may include unintentional dictation errors.    Vladimir Crofts, MD 11/02/21 2152

## 2021-11-02 NOTE — ED Triage Notes (Signed)
Pt had a total hip surgery last Tuesday, he developed pain and swelling in his left knee. He is also having SHOB, he called the surgeon on call and they wanted him to come in to be evaluated.

## 2021-11-02 NOTE — ED Notes (Signed)
Patient assessment- pt c/o pain 7/10 in left knee and left hip. Pt c/o pain due to laying in bed. Requests lowering head of bed and elevating feet. Does not request any pillows or elevation of left leg. Administered pain medication and let pt know I would re-evaluate. Pt denies SOB or trouble breathing but reports he feels he is breathing shorter and shallower due to pain/discomfort. All vitals (RR/SPO2) WNL.

## 2021-11-02 NOTE — ED Notes (Addendum)
Placed 16 fr foley urinary catheter with Latricia, NT assisting. Statlocked to right leg. Catheter flowing well. Urine light yellow and clear. Patient tolerated well. Applied lidocaine jelly per order for catheter placement. Pt reports previous foley placed after surgery was very uncomfortable and caused a lot of pain.

## 2022-01-29 ENCOUNTER — Other Ambulatory Visit (HOSPITAL_BASED_OUTPATIENT_CLINIC_OR_DEPARTMENT_OTHER): Payer: Self-pay | Admitting: Internal Medicine

## 2022-01-29 DIAGNOSIS — E781 Pure hyperglyceridemia: Secondary | ICD-10-CM

## 2022-02-23 ENCOUNTER — Other Ambulatory Visit: Payer: Self-pay

## 2022-02-23 DIAGNOSIS — E781 Pure hyperglyceridemia: Secondary | ICD-10-CM

## 2022-02-23 MED ORDER — ICOSAPENT ETHYL 1 G PO CAPS: 2.0000 g | ORAL_CAPSULE | Freq: Two times a day (BID) | ORAL | 3 refills | Status: DC

## 2022-03-16 ENCOUNTER — Encounter: Payer: Self-pay | Admitting: Urgent Care

## 2022-03-16 ENCOUNTER — Encounter
Admission: RE | Admit: 2022-03-16 | Discharge: 2022-03-16 | Disposition: A | Discharge status: Home / Self Care | Payer: Managed Care, Other (non HMO) | Source: Ambulatory Visit | Attending: Urology | Admitting: Urology

## 2022-03-16 DIAGNOSIS — Z01810 Encounter for preprocedural cardiovascular examination: Secondary | ICD-10-CM | POA: Insufficient documentation

## 2022-03-18 MED ORDER — MORPHINE SULFATE (PF) 10 MG/ML IV SOLN: 10.0000 mg | INTRAVENOUS | Status: AC

## 2022-03-18 MED ORDER — DEXTROSE-NACL 5-0.45 % IV SOLN: INTRAVENOUS | Status: DC

## 2022-03-18 MED ORDER — DIPHENHYDRAMINE HCL 25 MG PO CAPS: 25.0000 mg | ORAL_CAPSULE | ORAL | Status: AC

## 2022-03-18 MED ORDER — PROMETHAZINE HCL 25 MG/ML IJ SOLN: 25.0000 mg | INTRAMUSCULAR | Status: AC

## 2022-03-18 MED ORDER — MIDAZOLAM HCL 2 MG/2ML IJ SOLN: 1.0000 mg | INTRAMUSCULAR | Status: AC

## 2022-03-18 MED ORDER — GENTAMICIN SULFATE 40 MG/ML IJ SOLN
80.0000 mg | Freq: Once | INTRAVENOUS | Status: DC
  Filled 2022-03-18: qty 2

## 2022-03-19 ENCOUNTER — Ambulatory Visit
Admission: AD | Admit: 2022-03-19 | Discharge: 2022-03-19 | Disposition: A | Discharge status: Home / Self Care | Payer: Managed Care, Other (non HMO) | Attending: Urology | Admitting: Urology

## 2022-03-19 ENCOUNTER — Encounter
Admission: AD | Disposition: A | Discharge status: Home / Self Care | Payer: Self-pay | Source: Home / Self Care | Attending: Urology

## 2022-03-19 ENCOUNTER — Other Ambulatory Visit: Payer: Self-pay

## 2022-03-19 ENCOUNTER — Other Ambulatory Visit: Payer: Self-pay | Admitting: *Deleted

## 2022-03-19 DIAGNOSIS — N200 Calculus of kidney: Secondary | ICD-10-CM | POA: Diagnosis present

## 2022-03-19 DIAGNOSIS — E785 Hyperlipidemia, unspecified: Secondary | ICD-10-CM

## 2022-03-19 HISTORY — PX: EXTRACORPOREAL SHOCK WAVE LITHOTRIPSY: SHX1557

## 2022-03-19 SURGERY — LITHOTRIPSY, ESWL
Anesthesia: Moderate Sedation | Laterality: Left

## 2022-03-19 MED ORDER — MIDAZOLAM HCL 2 MG/2ML IJ SOLN
INTRAMUSCULAR | Status: AC
  Administered 2022-03-19: 1 mg via INTRAMUSCULAR
  Filled 2022-03-19: qty 2

## 2022-03-19 MED ORDER — FUROSEMIDE 10 MG/ML IJ SOLN
INTRAMUSCULAR | Status: AC
  Filled 2022-03-19: qty 2

## 2022-03-19 MED ORDER — GENTAMICIN IN SALINE 1.6-0.9 MG/ML-% IV SOLN
80.0000 mg | INTRAVENOUS | Status: AC
  Administered 2022-03-19: 80 mg via INTRAVENOUS
  Filled 2022-03-19: qty 50

## 2022-03-19 MED ORDER — PROMETHAZINE HCL 25 MG/ML IJ SOLN
INTRAMUSCULAR | Status: AC
  Administered 2022-03-19: 25 mg via INTRAMUSCULAR
  Filled 2022-03-19: qty 1

## 2022-03-19 MED ORDER — DIPHENHYDRAMINE HCL 25 MG PO CAPS
ORAL_CAPSULE | ORAL | Status: AC
  Administered 2022-03-19: 25 mg via ORAL
  Filled 2022-03-19: qty 1

## 2022-03-19 MED ORDER — MORPHINE SULFATE (PF) 10 MG/ML IV SOLN
INTRAVENOUS | Status: AC
  Administered 2022-03-19: 10 mg via INTRAMUSCULAR
  Filled 2022-03-19: qty 1

## 2022-03-19 MED ORDER — FUROSEMIDE 10 MG/ML IJ SOLN
10.0000 mg | Freq: Once | INTRAMUSCULAR | Status: AC
  Administered 2022-03-19: 10 mg via INTRAVENOUS

## 2022-03-19 NOTE — Progress Notes (Signed)
Patient states he straight caths after he urinated to make sure he is empty. Patient provided a urinal and a straight cath kit.  ?

## 2022-03-19 NOTE — Progress Notes (Signed)
Patient voided 627m of urine. Dark orange in color. Some small blood clots. Unable to see any stone fragments. Patient tolerating fluids. Ready to discharge. Patient's IV removed. Dressed and wheeled to exit. ?

## 2022-03-19 NOTE — Discharge Instructions (Signed)
Lithotripsy, Care After ?This sheet gives you information about how to care for yourself after your procedure. Your health care provider may also give you more specific instructions. If you have problems or questions, contact your health care provider. ?What can I expect after the procedure? ?After the procedure, it is common to have: ?Some blood in your urine. This should only last for a few days. ?Soreness in your back, sides, or upper abdomen for a few days. ?Blotches or bruises on the area where the shock wave entered the skin. ?Pain, discomfort, or nausea when pieces (fragments) of the kidney stone move through the tube that carries urine from the kidney to the bladder (ureter). Stone fragments may pass soon after the procedure, but they may continue to pass for up to 4-8 weeks. ?If you have severe pain or nausea, contact your health care provider. This may be caused by a large stone that was not broken up, and this may mean that you need more treatment. ?Some pain or discomfort during urination. ?Some pain or discomfort in the lower abdomen or (in men) at the base of the penis. ?Follow these instructions at home: ?Medicines ?Take over-the-counter and prescription medicines only as told by your health care provider. ?If you were prescribed an antibiotic medicine, take it as told by your health care provider. Do not stop taking the antibiotic even if you start to feel better. ?Ask your health care provider if the medicine prescribed to you requires you to avoid driving or using machinery. ?Eating and drinking ?A comparison of three sample cups showing dark yellow, yellow, and pale yellow urine. ? ?  ?A plate with examples of foods in a healthy diet. ? ?Drink enough fluid to keep your urine pale yellow. This helps any remaining pieces of the stone to pass. It can also help prevent new stones from forming. ?Eat plenty of fresh fruits and vegetables. ?Follow instructions from your health care provider about eating  or drinking restrictions. You may be instructed to: ?Reduce how much salt (sodium) you eat or drink. Check ingredients and nutrition facts on packaged foods and beverages to see how much sodium they contain. ?Reduce how much meat you eat. ?Eat the recommended amount of calcium for your age and gender. Ask your health care provider how much calcium you should have. ?General instructions ?Get plenty of rest. ?Return to your normal activities as told by your health care provider. Ask your health care provider what activities are safe for you. Most people can resume normal activities 1-2 days after the procedure. ?If you were given a sedative during the procedure, it can affect you for several hours. Do not drive or operate machinery until your health care provider says that it is safe. ?Your health care provider may direct you to lie in a certain position (postural drainage) and tap firmly (percuss) over your kidney area to help stone fragments pass. Follow instructions as told by your health care provider. ?If directed, strain all urine through the strainer that was provided by your health care provider. ?Keep all fragments for your health care provider to see. Any stones that are found may be sent to a medical lab for examination. The stone may be as small as a grain of salt. ?Keep all follow-up visits as told by your health care provider. This is important. ?Contact a health care provider if: ?You have a fever or chills. ?You have nausea that is severe or does not go away. ?You have any of these  urinary symptoms: ?Blood in your urine for longer than your health care provider told you to expect. ?Urine that smells bad or unusual. ?Feeling a strong urge to urinate after emptying your bladder. ?Pain or burning with urination that does not go away. ?Urinating more often than usual and this does not go away. ?You have a stent and it comes out. ?Get help right away if: ?You have severe pain in your back, sides, or upper  abdomen. ?You have any of these urinary symptoms: ?Severe pain while urinating. ?More blood in your urine or having blood in your urine when you did not before. ?Passing blood clots in your urine. ?Passing only a small amount of urine or being unable to pass any urine at all. ?You have severe nausea that leads to persistent vomiting. ?You faint. ?Summary ?After this procedure, it is common to have some pain, discomfort, or nausea when pieces (fragments) of the kidney stone move through the tube that carries urine from the kidney to the bladder (ureter). If this pain or nausea is severe, however, you should contact your health care provider. ?Return to your normal activities as told by your health care provider. Ask your health care provider what activities are safe for you. ?Drink enough fluid to keep your urine pale yellow. This helps any remaining pieces of the stone to pass, and it can help prevent new stones from forming. ?If directed, strain your urine and keep all fragments for your health care provider to see. Fragments or stones may be as small as a grain of salt. ?Get help right away if you have severe pain in your back, sides, or upper abdomen, or if you have severe pain while urinating. ?This information is not intended to replace advice given to you by your health care provider. Make sure you discuss any questions you have with your health care provider. ?Document Revised: 09/22/2021 Document Reviewed: 06/30/2021 ?[employer]vier Patient Education ? [employer]. ?   ? ?

## 2022-03-20 ENCOUNTER — Encounter: Payer: Self-pay | Admitting: Urology

## 2022-03-31 ENCOUNTER — Encounter: Payer: Self-pay | Admitting: Emergency Medicine

## 2022-03-31 ENCOUNTER — Other Ambulatory Visit: Payer: Self-pay

## 2022-03-31 DIAGNOSIS — J45909 Unspecified asthma, uncomplicated: Secondary | ICD-10-CM | POA: Insufficient documentation

## 2022-03-31 DIAGNOSIS — R1010 Upper abdominal pain, unspecified: Secondary | ICD-10-CM | POA: Insufficient documentation

## 2022-03-31 DIAGNOSIS — N189 Chronic kidney disease, unspecified: Secondary | ICD-10-CM | POA: Diagnosis not present

## 2022-03-31 DIAGNOSIS — Z96642 Presence of left artificial hip joint: Secondary | ICD-10-CM | POA: Diagnosis not present

## 2022-03-31 DIAGNOSIS — Z7984 Long term (current) use of oral hypoglycemic drugs: Secondary | ICD-10-CM | POA: Diagnosis not present

## 2022-03-31 DIAGNOSIS — Z79899 Other long term (current) drug therapy: Secondary | ICD-10-CM | POA: Insufficient documentation

## 2022-03-31 DIAGNOSIS — Z85828 Personal history of other malignant neoplasm of skin: Secondary | ICD-10-CM | POA: Diagnosis not present

## 2022-03-31 DIAGNOSIS — I2510 Atherosclerotic heart disease of native coronary artery without angina pectoris: Secondary | ICD-10-CM | POA: Insufficient documentation

## 2022-03-31 DIAGNOSIS — I129 Hypertensive chronic kidney disease with stage 1 through stage 4 chronic kidney disease, or unspecified chronic kidney disease: Secondary | ICD-10-CM | POA: Insufficient documentation

## 2022-03-31 DIAGNOSIS — E1122 Type 2 diabetes mellitus with diabetic chronic kidney disease: Secondary | ICD-10-CM | POA: Insufficient documentation

## 2022-03-31 LAB — CBC
HCT: 38.8 % — ABNORMAL LOW (ref 39.0–52.0)
Hemoglobin: 12.2 g/dL — ABNORMAL LOW (ref 13.0–17.0)
MCH: 27.2 pg (ref 26.0–34.0)
MCHC: 31.4 g/dL (ref 30.0–36.0)
MCV: 86.6 fL (ref 80.0–100.0)
Platelets: 406 10*3/uL — ABNORMAL HIGH (ref 150–400)
RBC: 4.48 MIL/uL (ref 4.22–5.81)
RDW: 17.2 % — ABNORMAL HIGH (ref 11.5–15.5)
WBC: 9 10*3/uL (ref 4.0–10.5)
nRBC: 0 % (ref 0.0–0.2)

## 2022-03-31 LAB — COMPREHENSIVE METABOLIC PANEL
ALT: 19 U/L (ref 0–44)
AST: 24 U/L (ref 15–41)
Albumin: 4 g/dL (ref 3.5–5.0)
Alkaline Phosphatase: 43 U/L (ref 38–126)
Anion gap: 9 (ref 5–15)
BUN: 30 mg/dL — ABNORMAL HIGH (ref 6–20)
CO2: 26 mmol/L (ref 22–32)
Calcium: 9.3 mg/dL (ref 8.9–10.3)
Chloride: 104 mmol/L (ref 98–111)
Creatinine, Ser: 1.25 mg/dL — ABNORMAL HIGH (ref 0.61–1.24)
GFR, Estimated: >60 mL/min (ref >60)
Glucose, Bld: 211 mg/dL — ABNORMAL HIGH (ref 70–99)
Potassium: 3.9 mmol/L (ref 3.5–5.1)
Sodium: 139 mmol/L (ref 135–145)
Total Bilirubin: 0.7 mg/dL (ref 0.3–1.2)
Total Protein: 7 g/dL (ref 6.5–8.1)

## 2022-03-31 LAB — CBG MONITORING, ED: Glucose-Capillary: 199 mg/dL — ABNORMAL HIGH (ref 70–99)

## 2022-03-31 LAB — LIPASE, BLOOD: Lipase: 49 U/L (ref 11–51)

## 2022-03-31 NOTE — ED Triage Notes (Signed)
Pt to ED from home c/o epigastric pain that started this morning with nausea no vomiting.  States woke up around 0330 to glucose monitor alarming that CBG was 63, has eaten throughout the day, glucose has been above 130 throughout the day.  Denies urinary changes but does self cath, denies SOB.  Pt A&Ox4, chest rise even and unlabored, skin WNL and in NAD at this time.

## 2022-04-01 ENCOUNTER — Emergency Department: Payer: Managed Care, Other (non HMO)

## 2022-04-01 ENCOUNTER — Emergency Department
Admission: EM | Admit: 2022-04-01 | Discharge: 2022-04-01 | Disposition: A | Discharge status: Home / Self Care | Payer: Managed Care, Other (non HMO) | Attending: Emergency Medicine | Admitting: Emergency Medicine

## 2022-04-01 DIAGNOSIS — R1010 Upper abdominal pain, unspecified: Secondary | ICD-10-CM

## 2022-04-01 LAB — URINALYSIS, ROUTINE W REFLEX MICROSCOPIC
Bacteria, UA: NONE SEEN
Bilirubin Urine: NEGATIVE
Glucose, UA: >=500 mg/dL — AB
Hgb urine dipstick: NEGATIVE
Ketones, ur: NEGATIVE mg/dL
Leukocytes,Ua: NEGATIVE
Nitrite: NEGATIVE
Protein, ur: NEGATIVE mg/dL
Specific Gravity, Urine: 1.025 (ref 1.005–1.030)
pH: 6 (ref 5.0–8.0)

## 2022-04-01 LAB — TROPONIN I (HIGH SENSITIVITY): Troponin I (High Sensitivity): 3 ng/L (ref <18)

## 2022-04-01 MED ORDER — PANTOPRAZOLE SODIUM 40 MG PO TBEC: 40.0000 mg | DELAYED_RELEASE_TABLET | Freq: Every day | ORAL | 1 refills | Status: AC

## 2022-04-01 MED ORDER — IOHEXOL 300 MG/ML  SOLN
100.0000 mL | Freq: Once | INTRAMUSCULAR | Status: AC | PRN
  Administered 2022-04-01: 100 mL via INTRAVENOUS

## 2022-04-01 MED ORDER — LIDOCAINE VISCOUS HCL 2 % MT SOLN
15.0000 mL | Freq: Once | OROMUCOSAL | Status: AC
  Administered 2022-04-01: 15 mL via ORAL
  Filled 2022-04-01: qty 15

## 2022-04-01 MED ORDER — DICYCLOMINE HCL 20 MG PO TABS: 20.0000 mg | ORAL_TABLET | Freq: Three times a day (TID) | ORAL | 0 refills | Status: DC | PRN

## 2022-04-01 MED ORDER — ONDANSETRON 4 MG PO TBDP: 4.0000 mg | ORAL_TABLET | Freq: Four times a day (QID) | ORAL | 0 refills | Status: DC | PRN

## 2022-04-01 MED ORDER — ONDANSETRON 4 MG PO TBDP
4.0000 mg | ORAL_TABLET | Freq: Once | ORAL | Status: AC
  Administered 2022-04-01: 4 mg via ORAL
  Filled 2022-04-01: qty 1

## 2022-04-01 MED ORDER — DICYCLOMINE HCL 10 MG/ML IM SOLN
20.0000 mg | Freq: Once | INTRAMUSCULAR | Status: AC
  Administered 2022-04-01: 20 mg via INTRAMUSCULAR
  Filled 2022-04-01: qty 2

## 2022-04-01 MED ORDER — ALUM & MAG HYDROXIDE-SIMETH 200-200-20 MG/5ML PO SUSP
30.0000 mL | Freq: Once | ORAL | Status: AC
  Administered 2022-04-01: 30 mL via ORAL
  Filled 2022-04-01: qty 30

## 2022-04-01 MED ORDER — PANTOPRAZOLE SODIUM 40 MG PO TBEC
40.0000 mg | DELAYED_RELEASE_TABLET | Freq: Once | ORAL | Status: AC
  Administered 2022-04-01: 40 mg via ORAL
  Filled 2022-04-01: qty 1

## 2022-04-01 NOTE — ED Notes (Signed)
US at bedside

## 2022-04-01 NOTE — Discharge Instructions (Signed)
Please avoid NSAIDs such as aspirin (Goody powders), ibuprofen (Motrin, Advil), naproxen (Aleve) as these may worsen your symptoms.  Tylenol 1000 mg every 6 hours is safe to take as long as you have no history of liver problems (heavy alcohol use, cirrhosis, hepatitis).  Please avoid spicy, acidic (citrus fruits, tomato based sauces, salsa), greasy, fatty foods.  Please avoid caffeine and alcohol.  Smoking can also make GERD/acid reflux worse.  Over the counter medications such as TUMS, Maalox or Mylanta, pepcid, Prilosec or Nexium may help with your symptoms.  Do not take Prilosec or Nexium if you are already prescribed a proton pump inhibitor.  

## 2022-04-01 NOTE — ED Provider Notes (Signed)
[facility] Provider Note    Event Date/Time   First MD Initiated Contact with Patient 04/01/22 0140     (approximate)   History   Abdominal Pain   HPI  Jesse Alvarado is a 56 y.o. male with history of diabetes, hypertension, hyperlipidemia, pancreatitis after being on Trulicity, chronic kidney disease who presents to the emergency department with upper abdominal pain that started today with nausea and diarrhea.  States this feels similar to when he had pancreatitis.  States he is concerned because he was just started on Januvia a week ago.  Yesterday his blood sugars were low and he states he contacted his endocrinologist who recommended that he decreased the amount of glimepiride he is taking.  He denies any vomiting, fevers, chest pain or shortness of breath, bloody stools or melena.  Has had previous umbilical hernia surgery.  States he has tramadol and hydrocodone at home but did not take these medications prior to arrival.   History provided by patient.    Past Medical History:  Diagnosis Date   Anemia    taking 3 iron pills a day   Anemia    Arthritis    Asthma    sports induced asthma. takes inhalers when needed   Barrett's esophagus without dysplasia    Cancer (HCC)    Basal and squamoous cell   Chronic kidney disease    Complication of anesthesia    high tolerance to pain medication. body absorbs pain med quickly   Coronary artery disease    Cough on exercise    Diabetes mellitus without complication (HCC)    Diverticulosis    Erectile dysfunction    Fatty liver    GERD (gastroesophageal reflux disease)    Hemorrhoids    History of kidney stones    Hyperlipidemia    Hypertension    per patient, he does not have high bp but is treated for his kidneys and his diabetes   Pancreatitis    Pneumonia 2012   Right shoulder injury    Sleep apnea    use C-PAP   Wears hearing aid in both ears     Past Surgical History:  Procedure  Laterality Date   ANKLE GANGLION CYST EXCISION Left    x 5   CARPAL TUNNEL RELEASE Right 10/03/2018   Procedure: CARPAL TUNNEL RELEASE;  Surgeon: Earnestine Leys, MD;  Location: [facility];  Service: Orthopedics;  Laterality: Right;   CARPAL TUNNEL RELEASE Left 10/24/2018   Procedure: CARPAL TUNNEL RELEASE;  Surgeon: Earnestine Leys, MD;  Location: [facility];  Service: Orthopedics;  Laterality: Left;   COLONOSCOPY     COLONOSCOPY WITH PROPOFOL N/A 08/30/2019   Procedure: COLONOSCOPY WITH PROPOFOL;  Surgeon: Toledo, Benay Pike, MD;  Location: [facility];  Service: Gastroenterology;  Laterality: N/A;   CYSTOSCOPY WITH STENT PLACEMENT Bilateral 06/05/2016   Procedure: CYSTOSCOPY WITH STENT PLACEMENT;  Surgeon: Nickie Retort, MD;  Location: [facility];  Service: Urology;  Laterality: Bilateral;   CYSTOSCOPY/URETEROSCOPY/HOLMIUM LASER/STENT PLACEMENT Left 04/13/2016   Procedure: CYSTOSCOPY/RETROGRADE PYELOGRAM/URETEROSCOPY WITH HOLMIUM LASER LITHOTRIPSY//STENT PLACEMENT;  Surgeon: Festus Aloe, MD;  Location: [facility];  Service: Urology;  Laterality: Left;   ESOPHAGOGASTRODUODENOSCOPY (EGD) WITH PROPOFOL N/A 12/27/2015   Procedure: ESOPHAGOGASTRODUODENOSCOPY (EGD) WITH PROPOFOL;  Surgeon: Manya Silvas, MD;  Location: [facility];  Service: Endoscopy;  Laterality: N/A;   ESOPHAGOGASTRODUODENOSCOPY (EGD) WITH PROPOFOL N/A 08/30/2019   Procedure: ESOPHAGOGASTRODUODENOSCOPY (EGD) WITH PROPOFOL;  Surgeon: Alice Reichert, Benay Pike, MD;  Location:  [facility];  Service: Gastroenterology;  Laterality: N/A;   EXTRACORPOREAL SHOCK WAVE LITHOTRIPSY Right 12/30/2017   Procedure: EXTRACORPOREAL SHOCK WAVE LITHOTRIPSY (ESWL);  Surgeon: Royston Cowper, MD;  Location: [facility];  Service: Urology;  Laterality: Right;   EXTRACORPOREAL SHOCK WAVE LITHOTRIPSY Left 03/19/2022   Procedure: EXTRACORPOREAL SHOCK WAVE LITHOTRIPSY (ESWL);  Surgeon: Royston Cowper, MD;  Location: [facility];  Service: Urology;   Laterality: Left;   HERNIA REPAIR  3428   umbilical   KNEE ARTHROSCOPY Right 2015   had bursa sack repaired   KNEE ARTHROSCOPY WITH MEDIAL MENISECTOMY Right 10/11/2020   Procedure: Right knee arthroscopy partial medial meniscectomy;  Surgeon: Leim Fabry, MD;  Location: [place];  Service: Orthopedics;  Laterality: Right;  Diabetic - oral meds sleep apnea   KNEE ARTHROSCOPY WITH MENISCAL REPAIR Right 11/18/2018   Procedure: KNEE ARTHROSCOPY WITH MENISCAL REPAIR AND CHONDROPLASTY;  Surgeon: Leim Fabry, MD;  Location: [place];  Service: Orthopedics;  Laterality: Right;  Diabetic - oral meds sleep apnea SUPINE WITH ACL LEG HOLDER SMITH AND NEWPHEW CETERIX   SHOULDER SURGERY Right 2011   tendon was shredded and was trimmed, repaired and reattached. Screws in shoulder   TOTAL HIP ARTHROPLASTY Left 10/28/2021   Procedure: TOTAL HIP ARTHROPLASTY;  Surgeon: Corky Mull, MD;  Location: [facility];  Service: Orthopedics;  Laterality: Left;   URETEROSCOPY WITH HOLMIUM LASER LITHOTRIPSY Bilateral 06/05/2016   Procedure: URETEROSCOPY WITH HOLMIUM LASER LITHOTRIPSY;  Surgeon: Nickie Retort, MD;  Location: [facility];  Service: Urology;  Laterality: Bilateral;   VASECTOMY  2002    MEDICATIONS:  Prior to Admission medications   Medication Sig Start Date End Date Taking? Authorizing Provider  acetaminophen (TYLENOL) 500 MG tablet Take 2 tablets (1,000 mg total) by mouth every 6 (six) hours. 10/29/21   Lattie Corns, PA-C  albuterol (VENTOLIN HFA) 108 (90 Base) MCG/ACT inhaler Inhale 2 puffs into the lungs every 6 (six) hours as needed for wheezing or shortness of breath.    [provider]  amitriptyline (ELAVIL) 25 MG tablet Take 25 mg by mouth at bedtime as needed. Recurring Cough- Dr Joya Gaskins- 08/30/18   [provider]  atorvastatin (LIPITOR) 80 MG tablet TAKE ONE TABLET BY MOUTH ONE TIME DAILY 01/30/22   Minus Breeding, MD  azelastine (OPTIVAR) 0.05  % ophthalmic solution Place 1 drop into both eyes daily as needed (allergies).    [provider]  Azelastine-Fluticasone (DYMISTA) 137-50 MCG/ACT SUSP Place 1 spray into both nostrils 2 (two) times daily.     [provider]  benzonatate (TESSALON) 200 MG capsule Take 200 mg by mouth 3 (three) times daily as needed for cough.    [provider]  calcium citrate-vitamin D (CITRACAL+D) 315-200 MG-UNIT tablet Take 1 tablet by mouth in the morning, at noon, and at bedtime.    [provider]  ciclopirox (PENLAC) 8 % solution Apply 1 application topically at bedtime. Apply over nail and surrounding skin. Apply daily over previous coat. After seven (7) days, may remove with alcohol and continue cycle. 06/02/19   Glean Hess, MD  Continuous Blood Gluc Sensor (FREESTYLE LIBRE 2 SENSOR) MISC USE 1 KIT EVERY 14 DAYS FOR GLUCOSE MONITORING 04/24/21   [provider]  cyanocobalamin (,VITAMIN B-12,) 1000 MCG/ML injection ADMINISTER 1 ML(1000 MCG) IN THE MUSCLE EVERY 30 DAYS 10/14/20   Glean Hess, MD  dapagliflozin propanediol (FARXIGA) 10 MG TABS tablet Take 10 mg by mouth daily.  [provider]  ezetimibe (ZETIA) 10 MG tablet TAKE ONE TABLET BY MOUTH ONE TIME DAILY 11/04/21   Pixie Casino, MD  fenofibrate (TRICOR) 145 MG tablet Take 1 tablet (145 mg total) by mouth daily. Patient taking differently: Take 145 mg by mouth every morning. 11/06/19   Glean Hess, MD  ferrous gluconate (FERGON) 324 MG tablet Take 324 mg by mouth 3 (three) times daily with meals. 10/21/20   [provider]  folic acid (FOLVITE) 1 MG tablet TAKE 1 TABLET(1 MG) BY MOUTH DAILY 07/25/20   Glean Hess, MD  gabapentin (NEURONTIN) 300 MG capsule Take 300 mg by mouth 3 (three) times daily. For chronic cough/laryngeal inflammation 02/02/20   [provider]  glimepiride (AMARYL) 2 MG tablet Take 4 tablets (8 mg total) by mouth See admin  instructions. Take 22m with breakfast, 231mwith lunch, and 81m73mith supper. Patient taking differently: Take 2 mg by mouth 2 (two) times daily. One in the morning and one in the evening 06/02/19   BerGlean HessD  glucose blood (PRECISION QID TEST) test strip Use once daily Use as instructed.    [provider]  icosapent Ethyl (VASCEPA) 1 g capsule Take 2 capsules (2 g total) by mouth 2 (two) times daily. 02/23/22   Hilty, KenNadean CorwinD  ipratropium (ATROVENT) 0.06 % nasal spray Place 2 sprays into both nostrils 2 (two) times daily.  03/11/18   [provider]  ketoconazole (NIZORAL) 2 % shampoo WASH SCALP EVERY OTHER WASHING ALTERNATING WITH HEAD AND SHOULDERS. LET SIT SEVERAL MINUTES BEFORE RINSING. 04/21/19   [provider]  levocetirizine (XYZAL) 5 MG tablet Take 5 mg by mouth every evening.  03/25/16   [provider]  losartan (COZAAR) 50 MG tablet Take 50 mg by mouth every morning. 03/13/21   [provider]  metFORMIN (GLUCOPHAGE) 850 MG tablet TAKE 1 TABLET(850 MG) BY MOUTH THREE TIMES DAILY WITH MEALS 06/18/20   BerGlean HessD  ondansetron (ZOFRAN ODT) 4 MG disintegrating tablet Take 1 tablet (4 mg total) by mouth every 8 (eight) hours as needed for nausea or vomiting. 10/11/20   PatLeim FabryD  oxyCODONE (OXY IR/ROXICODONE) 5 MG immediate release tablet Take 1-2 tablets (5-10 mg total) by mouth every 4 (four) hours as needed for severe pain. 10/29/21   McGLattie CornsA-C  pantoprazole (PROTONIX) 40 MG tablet Take 1 tablet (40 mg total) by mouth 2 (two) times daily. 08/26/20   BerGlean HessD  pioglitazone (ACTOS) 15 MG tablet Take 15 mg by mouth daily. 10/16/20   [provider]  silodosin (RAPAFLO) 4 MG CAPS capsule Take 4 mg by mouth every evening. 08/20/20   [provider]  sucralfate (CARAFATE) 1 g tablet TAKE 1 TABLET(1 GRAM) BY MOUTH THREE TIMES DAILY WITH MEALS 08/14/20   BerGlean HessD   tadalafil (CIALIS) 20 MG tablet Take 20 mg by mouth daily as needed for erectile dysfunction. 03/13/21   [provider]  tiZANidine (ZANAFLEX) 2 MG tablet Take 2 mg by mouth at bedtime as needed for muscle spasms.    [provider]  traMADol (ULTRAM) 50 MG tablet Take 1 tablet (50 mg total) by mouth every 6 (six) hours as needed for moderate pain. 10/29/21   McGLattie CornsA-C    Physical Exam   Triage Vital Signs: ED Triage Vitals  Enc Vitals Group     BP 03/31/22 2056 123/81  Pulse Rate 03/31/22 2056 83     Resp 03/31/22 2056 14     Temp 03/31/22 2056 98.4 F (36.9 C)     Temp Source 03/31/22 2056 Oral     SpO2 03/31/22 2056 98 %     Weight 03/31/22 2057 177 lb (80.3 kg)     Height 03/31/22 2057 5' 10"  (1.778 m)     Head Circumference --      Peak Flow --      Pain Score 03/31/22 2056 7     Pain Loc --      Pain Edu? --      Excl. in [place]? --     Most recent vital signs: Vitals:   04/01/22 0205 04/01/22 0511  BP: 112/78 106/75  Pulse: 74 75  Resp: 16 16  Temp:    SpO2: 96% 99%    CONSTITUTIONAL: Alert and oriented and responds appropriately to questions. Well-appearing; well-nourished HEAD: Normocephalic, atraumatic EYES: Conjunctivae clear, pupils appear equal, sclera nonicteric ENT: normal nose; moist mucous membranes NECK: Supple, normal ROM CARD: RRR; S1 and S2 appreciated; no murmurs, no clicks, no rubs, no gallops RESP: Normal chest excursion without splinting or tachypnea; breath sounds clear and equal bilaterally; no wheezes, no rhonchi, no rales, no hypoxia or respiratory distress, speaking full sentences ABD/GI: Normal bowel sounds; non-distended; soft, slightly tender to palpation in the upper abdomen without guarding or rebound BACK: The back appears normal EXT: Normal ROM in all joints; no deformity noted, no edema; no cyanosis SKIN: Normal color for age and race; warm; no rash on exposed skin NEURO: Moves all extremities  equally, normal speech PSYCH: The patient's mood and manner are appropriate.   ED Results / Procedures / Treatments   LABS: (all labs ordered are listed, but only abnormal results are displayed) Labs Reviewed  COMPREHENSIVE METABOLIC PANEL - Abnormal; Notable for the following components:      Result Value   Glucose, Bld 211 (*)    BUN 30 (*)    Creatinine, Ser 1.25 (*)    All other components within normal limits  CBC - Abnormal; Notable for the following components:   Hemoglobin 12.2 (*)    HCT 38.8 (*)    RDW 17.2 (*)    Platelets 406 (*)    All other components within normal limits  URINALYSIS, ROUTINE W REFLEX MICROSCOPIC - Abnormal; Notable for the following components:   Color, Urine YELLOW (*)    APPearance CLEAR (*)    Glucose, UA >=500 (*)    All other components within normal limits  CBG MONITORING, ED - Abnormal; Notable for the following components:   Glucose-Capillary 199 (*)    All other components within normal limits  LIPASE, BLOOD  TROPONIN I (HIGH SENSITIVITY)     EKG:  EKG Interpretation  Date/Time:  Tuesday Mar 31 2022 20:51:35 EDT Ventricular Rate:  81 PR Interval:  152 QRS Duration: 86 QT Interval:  370 QTC Calculation: 429 R Axis:   87 Text Interpretation: Normal sinus rhythm Cannot rule out Anterior infarct , age undetermined Abnormal ECG When compared with ECG of 16-Mar-2022 09:55, QRS axis Shifted left Criteria for Inferior infarct are no longer Present ST no longer depressed in Lateral leads T wave inversion no longer evident in Inferior leads Confirmed by Pryor Curia [phone]) on 04/01/2022 2:03:58 AM         RADIOLOGY: My personal review and interpretation of imaging: CT scan and ultrasound showed hepatic steatosis but no other  acute abnormality.  I have personally reviewed all radiology reports.   CT ABDOMEN PELVIS W CONTRAST  Result Date: 04/01/2022 CLINICAL DATA:  Epigastric pain and nausea. EXAM: CT ABDOMEN AND PELVIS WITH  CONTRAST TECHNIQUE: Multidetector CT imaging of the abdomen and pelvis was performed using the standard protocol following bolus administration of intravenous contrast. RADIATION DOSE REDUCTION: This exam was performed according to the departmental dose-optimization program which includes automated exposure control, adjustment of the mA and/or kV according to patient size and/or use of iterative reconstruction technique. CONTRAST:  173m OMNIPAQUE IOHEXOL 300 MG/ML  SOLN COMPARISON:  July 30, 2018 FINDINGS: Lower chest: Mild atelectasis is seen within the bilateral lung bases. Hepatobiliary: There is diffuse fatty infiltration of the liver parenchyma. No focal liver abnormality is seen. No gallstones, gallbladder wall thickening, or biliary dilatation. Pancreas: A subcentimeter parenchymal calcification is seen within the body of the pancreas. No pancreatic ductal dilatation or surrounding inflammatory changes. Spleen: Normal in size without focal abnormality. Adrenals/Urinary Tract: Adrenal glands are unremarkable. Kidneys are normal in size, without obstructing renal calculi, focal lesion, or hydronephrosis. Bilateral 2 mm and 3 mm nonobstructing renal calculi are seen. The urinary bladder is poorly distended and subsequently limited in evaluation. Moderate severity diffuse urinary bladder wall thickening is noted. Stomach/Bowel: Stomach is within normal limits. Appendix appears normal. No evidence of bowel wall thickening, distention, or inflammatory changes. Noninflamed diverticula are seen throughout the large bowel. Vascular/Lymphatic: No significant vascular findings are present. No enlarged abdominal or pelvic lymph nodes. Reproductive: The prostate gland is mildly enlarged. Other: No abdominal wall hernia or abnormality. No abdominopelvic ascites. Musculoskeletal: There is a total left hip replacement with associated streak artifact and subsequently limited evaluation of the adjacent osseous and soft  tissue structures. No acute osseous abnormalities are identified. IMPRESSION: 1. Hepatic steatosis. 2. Bilateral 2 mm and 3 mm nonobstructing renal calculi. 3. Colonic diverticulosis. Electronically Signed   By: TVirgina NorfolkM.D.   On: 04/01/2022 03:46   [place]DOM[place] LIMITED RUQ (LIVER/GB)  Result Date: 04/01/2022 CLINICAL DATA:  Upper abdominal pain since yesterday morning EXAM: ULTRASOUND ABDOMEN LIMITED RIGHT UPPER QUADRANT COMPARISON:  Abdominal CT from earlier today FINDINGS: Gallbladder: No gallstones or wall thickening visualized. No sonographic Murphy sign noted by sonographer. Common bile duct: Diameter: 3 mm Liver: Echogenic liver with sparing at the gallbladder fossa. Portal vein is patent on color Doppler imaging with normal direction of blood flow towards the liver. Other: 8 mm shadowing stone at the interpolar right kidney. IMPRESSION: 1. Negative gallbladder. 2. Hepatic steatosis. 3. 8 mm right renal calculus. Electronically Signed   By: JJorje GuildM.D.   On: 04/01/2022 05:01     PROCEDURES:  Critical Care performed: No     .1-3 Lead EKG Interpretation Performed by: Jovita Persing, KDelice Bison DO Authorized by: Shrika Milos, KDelice Bison DO     Interpretation: normal     ECG rate:  74   ECG rate assessment: normal     Rhythm: sinus rhythm     Ectopy: none     Conduction: normal      IMPRESSION / MDM / ASSESSMENT AND PLAN / ED COURSE  I reviewed the triage vital signs and the nursing notes.    Patient here with complaints of upper abdominal pain that feels similar to when he has had previous pancreatitis.  Was discharged on Januvia last week.  The patient is on the cardiac monitor to evaluate for evidence of arrhythmia and/or significant heart rate changes.  DIFFERENTIAL DIAGNOSIS (includes but not limited to):   Pancreatitis, gastritis, GERD, H. pylori, peptic ulcer disease, biliary colic, cholecystitis, less likely ACS.  Doubt appendicitis, bowel obstruction.   PLAN: We  will obtain CBC, CMP, lipase, troponin, EKG, CT of the abdomen pelvis.  States he drove himself to the emergency department and would like to drive home.  Will give GI cocktail, Protonix and Zofran.   MEDICATIONS GIVEN IN ED: Medications  alum & mag hydroxide-simeth (MAALOX/MYLANTA) 200-200-20 MG/5ML suspension 30 mL (30 mLs Oral Given 04/01/22 0310)    And  lidocaine (XYLOCAINE) 2 % viscous mouth solution 15 mL (15 mLs Oral Given 04/01/22 0310)  pantoprazole (PROTONIX) EC tablet 40 mg (40 mg Oral Given 04/01/22 0310)  ondansetron (ZOFRAN-ODT) disintegrating tablet 4 mg (4 mg Oral Given 04/01/22 0310)  iohexol (OMNIPAQUE) 300 MG/ML solution 100 mL (100 mLs Intravenous Contrast Given 04/01/22 0330)  dicyclomine (BENTYL) injection 20 mg (20 mg Intramuscular Given 04/01/22 0511)     ED COURSE: Patient's labs show no leukocytosis, normal hemoglobin.  Normal electrolytes.  Stable chronic kidney disease.  Normal LFTs and lipase.  Troponin negative.  Urine shows no sign of infection.  CT of the abdomen pelvis reviewed by myself and radiology and shows hepatic steatosis without any other acute abnormality.  Appendix is normal.  Gallbladder is normal.  Patient reports no improvement in pain after GI cocktail, Protonix.  Discussed with him that this could be biliary colic and that CT is not always great at visualizing stones.  Given he is still having pain, will obtain a right upper quadrant ultrasound.  Will give IM Bentyl.   Patient's ultrasound reviewed and interpreted by myself and radiology.  Shows hepatic steatosis but gallbladder otherwise normal in appearance.  Patient just received his IM Bentyl due to delay in getting it from pharmacy.  He states he does not want to wait any longer to see if this helps with his pain.  He is comfortable with plan for discharge home.  Discussed with him that these symptoms may be secondary to his new diabetes medications and have recommended that he talk to his  endocrinologist.  We discussed that if symptoms do not improve I have also given him follow-up for gastroenterology as he may need an endoscopy.  Recommended bland diet.  He is already on Protonix.  Will discharge with Bentyl.   At this time, I do not feel there is any life-threatening condition present. I reviewed all nursing notes, vitals, pertinent previous records.  All lab and urine results, EKGs, imaging ordered have been independently reviewed and interpreted by myself.  I reviewed all available radiology reports from any imaging ordered this visit.  Based on my assessment, I feel the patient is safe to be discharged home without further emergent workup and can continue workup as an outpatient as needed. Discussed all findings, treatment plan as well as usual and customary return precautions with patient.  They verbalize understanding and are comfortable with this plan.  Outpatient follow-up has been provided as needed.  All questions have been answered.    CONSULTS: No admission needed at this time given patient is well-appearing with reassuring work-up.  Tolerating p.o.   OUTSIDE RECORDS REVIEWED: Reviewed patient's recent internal medicine visit on 03/16/2022.         FINAL CLINICAL IMPRESSION(S) / ED DIAGNOSES   Final diagnoses:  Upper abdominal pain     Rx / DC Orders   ED Discharge Orders  Ordered    pantoprazole (PROTONIX) 40 MG tablet  Daily        04/01/22 0533    ondansetron (ZOFRAN-ODT) 4 MG disintegrating tablet  Every 6 hours PRN        04/01/22 0533    dicyclomine (BENTYL) 20 MG tablet  Every 8 hours PRN        04/01/22 0533             Note:  This document was prepared using Dragon voice recognition software and may include unintentional dictation errors.   Zana Biancardi, Delice Bison, DO 04/01/22 [phone]

## 2022-05-01 ENCOUNTER — Other Ambulatory Visit (HOSPITAL_BASED_OUTPATIENT_CLINIC_OR_DEPARTMENT_OTHER): Payer: Self-pay | Admitting: Internal Medicine

## 2022-05-26 ENCOUNTER — Ambulatory Visit (INDEPENDENT_AMBULATORY_CARE_PROVIDER_SITE_OTHER): Payer: Managed Care, Other (non HMO)

## 2022-05-26 DIAGNOSIS — R931 Abnormal findings on diagnostic imaging of heart and coronary circulation: Secondary | ICD-10-CM

## 2022-05-26 LAB — EXERCISE TOLERANCE TEST
Angina Index: 0
Estimated workload: 10.1
Exercise duration (min): 9 min
Exercise duration (sec): 0 s
MPHR: 163 {beats}/min
Peak HR: 155 {beats}/min
Percent HR: 95 %
RPE: 16
Rest HR: 94 {beats}/min

## 2022-06-04 ENCOUNTER — Encounter: Payer: Self-pay | Admitting: *Deleted

## 2022-07-08 ENCOUNTER — Other Ambulatory Visit (HOSPITAL_BASED_OUTPATIENT_CLINIC_OR_DEPARTMENT_OTHER): Payer: Self-pay | Admitting: Internal Medicine

## 2022-07-13 ENCOUNTER — Encounter: Payer: Self-pay | Admitting: Cardiology

## 2022-07-13 NOTE — Progress Notes (Unsigned)
Cardiology Office Note   Date:  07/13/2022   ID:  DEMICHAEL TRAUM, DOB 04-30-1966, MRN [id number]  PCP:  Gladstone Lighter, MD  Cardiologist:   None Referring:  Gladstone Lighter, MD  No chief complaint on file.       History of Present Illness: Jesse Alvarado is a 56 y.o. male who presents for evaluation of coronary calcium.  This was found on a CT. I saw him for evaluation of shortness of breath and cough.  He had a negative perfusion study ultimately had a negative cardiopulmonary stress test.    He presents for follow-up.  Since I last saw him ***  ***  he has done well.  He has been active and has no chest pain.  I reviewed with him at length his POET (Plain Old Exercise Treadmill).  He was able to walk over 9 minutes before developing some transient ST depression that resolved quickly.  He had no chest pain or other symptoms associated.  The patient denies any new symptoms such as chest discomfort, neck or arm discomfort. There has been no new shortness of breath, PND or orthopnea. There have been no reported palpitations, presyncope or syncope.    Past Medical History:  Diagnosis Date   Anemia    taking 3 iron pills a day   Anemia    Arthritis    Asthma    sports induced asthma. takes inhalers when needed   Barrett's esophagus without dysplasia    Cancer (HCC)    Basal and squamoous cell   Chronic kidney disease    Complication of anesthesia    high tolerance to pain medication. body absorbs pain med quickly   Coronary artery disease    Cough on exercise    Diabetes mellitus without complication (HCC)    Diverticulosis    Erectile dysfunction    Fatty liver    GERD (gastroesophageal reflux disease)    Hemorrhoids    History of kidney stones    Hyperlipidemia    Hypertension    per patient, he does not have high bp but is treated for his kidneys and his diabetes   Pancreatitis    Pneumonia 2012   Right shoulder injury    Sleep apnea    use  C-PAP   Wears hearing aid in both ears     Past Surgical History:  Procedure Laterality Date   ANKLE GANGLION CYST EXCISION Left    x 5   CARPAL TUNNEL RELEASE Right 10/03/2018   Procedure: CARPAL TUNNEL RELEASE;  Surgeon: Earnestine Leys, MD;  Location: [facility];  Service: Orthopedics;  Laterality: Right;   CARPAL TUNNEL RELEASE Left 10/24/2018   Procedure: CARPAL TUNNEL RELEASE;  Surgeon: Earnestine Leys, MD;  Location: [facility];  Service: Orthopedics;  Laterality: Left;   COLONOSCOPY     COLONOSCOPY WITH PROPOFOL N/A 08/30/2019   Procedure: COLONOSCOPY WITH PROPOFOL;  Surgeon: Toledo, Benay Pike, MD;  Location: [facility];  Service: Gastroenterology;  Laterality: N/A;   CYSTOSCOPY WITH STENT PLACEMENT Bilateral 06/05/2016   Procedure: CYSTOSCOPY WITH STENT PLACEMENT;  Surgeon: Nickie Retort, MD;  Location: [facility];  Service: Urology;  Laterality: Bilateral;   CYSTOSCOPY/URETEROSCOPY/HOLMIUM LASER/STENT PLACEMENT Left 04/13/2016   Procedure: CYSTOSCOPY/RETROGRADE PYELOGRAM/URETEROSCOPY WITH HOLMIUM LASER LITHOTRIPSY//STENT PLACEMENT;  Surgeon: Festus Aloe, MD;  Location: [facility];  Service: Urology;  Laterality: Left;   ESOPHAGOGASTRODUODENOSCOPY (EGD) WITH PROPOFOL N/A 12/27/2015   Procedure: ESOPHAGOGASTRODUODENOSCOPY (EGD) WITH PROPOFOL;  Surgeon: Manya Silvas, MD;  Location: [facility];  Service: Endoscopy;  Laterality: N/A;   ESOPHAGOGASTRODUODENOSCOPY (EGD) WITH PROPOFOL N/A 08/30/2019   Procedure: ESOPHAGOGASTRODUODENOSCOPY (EGD) WITH PROPOFOL;  Surgeon: Toledo, Benay Pike, MD;  Location: [facility];  Service: Gastroenterology;  Laterality: N/A;   EXTRACORPOREAL SHOCK WAVE LITHOTRIPSY Right 12/30/2017   Procedure: EXTRACORPOREAL SHOCK WAVE LITHOTRIPSY (ESWL);  Surgeon: Royston Cowper, MD;  Location: [facility];  Service: Urology;  Laterality: Right;   EXTRACORPOREAL SHOCK WAVE LITHOTRIPSY Left 03/19/2022   Procedure: EXTRACORPOREAL SHOCK WAVE LITHOTRIPSY  (ESWL);  Surgeon: Royston Cowper, MD;  Location: [facility];  Service: Urology;  Laterality: Left;   HERNIA REPAIR  8592   umbilical   KNEE ARTHROSCOPY Right 2015   had bursa sack repaired   KNEE ARTHROSCOPY WITH MEDIAL MENISECTOMY Right 10/11/2020   Procedure: Right knee arthroscopy partial medial meniscectomy;  Surgeon: Leim Fabry, MD;  Location: [place];  Service: Orthopedics;  Laterality: Right;  Diabetic - oral meds sleep apnea   KNEE ARTHROSCOPY WITH MENISCAL REPAIR Right 11/18/2018   Procedure: KNEE ARTHROSCOPY WITH MENISCAL REPAIR AND CHONDROPLASTY;  Surgeon: Leim Fabry, MD;  Location: [place];  Service: Orthopedics;  Laterality: Right;  Diabetic - oral meds sleep apnea SUPINE WITH ACL LEG HOLDER SMITH AND NEWPHEW CETERIX   SHOULDER SURGERY Right 2011   tendon was shredded and was trimmed, repaired and reattached. Screws in shoulder   TOTAL HIP ARTHROPLASTY Left 10/28/2021   Procedure: TOTAL HIP ARTHROPLASTY;  Surgeon: Corky Mull, MD;  Location: [facility];  Service: Orthopedics;  Laterality: Left;   URETEROSCOPY WITH HOLMIUM LASER LITHOTRIPSY Bilateral 06/05/2016   Procedure: URETEROSCOPY WITH HOLMIUM LASER LITHOTRIPSY;  Surgeon: Nickie Retort, MD;  Location: [facility];  Service: Urology;  Laterality: Bilateral;   VASECTOMY  2002     Current Outpatient Medications  Medication Sig Dispense Refill   acetaminophen (TYLENOL) 500 MG tablet Take 2 tablets (1,000 mg total) by mouth every 6 (six) hours. 30 tablet 0   albuterol (VENTOLIN HFA) 108 (90 Base) MCG/ACT inhaler Inhale 2 puffs into the lungs every 6 (six) hours as needed for wheezing or shortness of breath.     amitriptyline (ELAVIL) 25 MG tablet Take 25 mg by mouth at bedtime as needed. Recurring Cough- Dr Joya Gaskins-  11   atorvastatin (LIPITOR) 80 MG tablet TAKE ONE TABLET BY MOUTH ONE TIME DAILY 90 tablet 2   azelastine (OPTIVAR) 0.05 % ophthalmic solution Place 1 drop into both eyes daily as  needed (allergies).     Azelastine-Fluticasone (DYMISTA) 137-50 MCG/ACT SUSP Place 1 spray into both nostrils 2 (two) times daily.      benzonatate (TESSALON) 200 MG capsule Take 200 mg by mouth 3 (three) times daily as needed for cough.     calcium citrate-vitamin D (CITRACAL+D) 315-200 MG-UNIT tablet Take 1 tablet by mouth in the morning, at noon, and at bedtime.     ciclopirox (PENLAC) 8 % solution Apply 1 application topically at bedtime. Apply over nail and surrounding skin. Apply daily over previous coat. After seven (7) days, may remove with alcohol and continue cycle. 6.6 mL 5   Continuous Blood Gluc Sensor (FREESTYLE LIBRE 2 SENSOR) MISC USE 1 KIT EVERY 14 DAYS FOR GLUCOSE MONITORING     cyanocobalamin (,VITAMIN B-12,) 1000 MCG/ML injection ADMINISTER 1 ML(1000 MCG) IN THE MUSCLE EVERY 30 DAYS 1 mL 11   dapagliflozin propanediol (FARXIGA) 10 MG TABS tablet Take 10 mg by mouth daily.     dicyclomine (BENTYL) 20 MG  tablet Take 1 tablet (20 mg total) by mouth every 8 (eight) hours as needed for spasms (Abdominal cramping). 15 tablet 0   ezetimibe (ZETIA) 10 MG tablet TAKE ONE TABLET BY MOUTH ONE TIME DAILY 90 tablet 3   fenofibrate (TRICOR) 145 MG tablet Take 1 tablet (145 mg total) by mouth daily. (Patient taking differently: Take 145 mg by mouth every morning.) 90 tablet 3   ferrous gluconate (FERGON) 324 MG tablet Take 324 mg by mouth 3 (three) times daily with meals.     folic acid (FOLVITE) 1 MG tablet TAKE 1 TABLET(1 MG) BY MOUTH DAILY 90 tablet 0   gabapentin (NEURONTIN) 300 MG capsule Take 300 mg by mouth 3 (three) times daily. For chronic cough/laryngeal inflammation     glimepiride (AMARYL) 2 MG tablet Take 4 tablets (8 mg total) by mouth See admin instructions. Take 50m with breakfast, 223mwith lunch, and 68m2mith supper. (Patient taking differently: Take 2 mg by mouth 2 (two) times daily. One in the morning and one in the evening) 360 tablet 1   glucose blood (PRECISION QID TEST)  test strip Use once daily Use as instructed.     icosapent Ethyl (VASCEPA) 1 g capsule Take 2 capsules (2 g total) by mouth 2 (two) times daily. 360 capsule 3   ipratropium (ATROVENT) 0.06 % nasal spray Place 2 sprays into both nostrils 2 (two) times daily.   3   ketoconazole (NIZORAL) 2 % shampoo WASH SCALP EVERY OTHER WASHING ALTERNATING WITH HEAD AND SHOULDERS. LET SIT SEVERAL MINUTES BEFORE RINSING.     levocetirizine (XYZAL) 5 MG tablet Take 5 mg by mouth every evening.   11   losartan (COZAAR) 50 MG tablet Take 50 mg by mouth every morning.     metFORMIN (GLUCOPHAGE) 850 MG tablet TAKE 1 TABLET(850 MG) BY MOUTH THREE TIMES DAILY WITH MEALS 270 tablet 1   ondansetron (ZOFRAN-ODT) 4 MG disintegrating tablet Take 1 tablet (4 mg total) by mouth every 6 (six) hours as needed for nausea or vomiting. 20 tablet 0   oxyCODONE (OXY IR/ROXICODONE) 5 MG immediate release tablet Take 1-2 tablets (5-10 mg total) by mouth every 4 (four) hours as needed for severe pain. 40 tablet 0   pantoprazole (PROTONIX) 40 MG tablet Take 1 tablet (40 mg total) by mouth 2 (two) times daily. 180 tablet 1   pantoprazole (PROTONIX) 40 MG tablet Take 1 tablet (40 mg total) by mouth daily. 30 tablet 1   pioglitazone (ACTOS) 15 MG tablet Take 15 mg by mouth daily.     silodosin (RAPAFLO) 4 MG CAPS capsule Take 4 mg by mouth every evening.     sucralfate (CARAFATE) 1 g tablet TAKE 1 TABLET(1 GRAM) BY MOUTH THREE TIMES DAILY WITH MEALS 270 tablet 1   tadalafil (CIALIS) 20 MG tablet Take 20 mg by mouth daily as needed for erectile dysfunction.     tiZANidine (ZANAFLEX) 2 MG tablet Take 2 mg by mouth at bedtime as needed for muscle spasms.     traMADol (ULTRAM) 50 MG tablet Take 1 tablet (50 mg total) by mouth every 6 (six) hours as needed for moderate pain. 30 tablet 0   No current facility-administered medications for this visit.    Allergies:   Dulaglutide, Monosodium glutamate, and Ciprofloxacin    ROS:  Please see the  history of present illness.   Otherwise, review of systems are positive for ***.   All other systems are reviewed and negative.  PHYSICAL EXAM: VS:  There were no vitals taken for this visit. , BMI There is no height or weight on file to calculate BMI. GENERAL:  Well appearing NECK:  No jugular venous distention, waveform within normal limits, carotid upstroke brisk and symmetric, no bruits, no thyromegaly LUNGS:  Clear to auscultation bilaterally CHEST:  Unremarkable HEART:  PMI not displaced or sustained,S1 and S2 within normal limits, no S3, no S4, no clicks, no rubs, *** murmurs ABD:  Flat, positive bowel sounds normal in frequency in pitch, no bruits, no rebound, no guarding, no midline pulsatile mass, no hepatomegaly, no splenomegaly EXT:  2 plus pulses throughout, no edema, no cyanosis no clubbing     ***GENERAL:  Well appearing NECK:  No jugular venous distention, waveform within normal limits, carotid upstroke brisk and symmetric, no bruits, no thyromegaly LUNGS:  Clear to auscultation bilaterally CHEST:  Unremarkable HEART:  PMI not displaced or sustained,S1 and S2 within normal limits, no S3, no S4, no clicks, no rubs, no murmurs ABD:  Flat, positive bowel sounds normal in frequency in pitch, no bruits, no rebound, no guarding, no midline pulsatile mass, no hepatomegaly, no splenomegaly EXT:  2 plus pulses throughout, no edema, no cyanosis no clubbing   EKG:  EKG is *** ordered today. ***   Recent Labs: 11/02/2021: B Natriuretic Peptide 13.1 03/31/2022: ALT 19; BUN 30; Creatinine, Ser 1.25; Hemoglobin 12.2; Platelets 406; Potassium 3.9; Sodium 139    Lipid Panel    Component Value Date/Time   CHOL 134 06/27/2020 0816   TRIG 213 (H) 06/27/2020 0816   HDL 29 (L) 06/27/2020 0816   CHOLHDL 4.6 06/27/2020 0816   CHOLHDL 5.9 07/30/2018 0513   VLDL 63 (H) 07/30/2018 0513   LDLCALC 70 06/27/2020 0816      Wt Readings from Last 3 Encounters:  03/31/22 177 lb (80.3  kg)  11/02/21 183 lb (83 kg)  10/28/21 182 lb 15.7 oz (83 kg)      Other studies Reviewed: Additional studies/ records that were reviewed today include: *** Review of the above records demonstrates:  Please see elsewhere in the note.     ASSESSMENT AND PLAN:  CORONARY CALCIUM:   He had a POET (Plain Old Exercise Treadmill) without high risk findings.  ***   We had a long discussion about this.  He has no symptoms.  He is getting optimal medical therapy and aggressive risk reduction.  I plan to bring him back for a treadmill again in July but more importantly we talked about lifestyle, risk reduction and awareness of potential symptoms that he could be looking for.  He was comfortable with this conversation.   DYSLIPIDEMIA:   LDL was *** most recently was 71 with an HDL of 33.5.  He is seen by Dr. Debara Pickett.   DM:   ***  He is followed by [facility] at [facility].  PALPITATIONS:   ***  He is not feeling these.  No change in therapy.   Current medicines are reviewed at length with the patient today.  The patient does not have concerns regarding medicines.  The following changes have been made:  ***  Labs/ tests ordered today include: ***  No orders of the defined types were placed in this encounter.   Disposition:   FU with me ***   Signed, Minus Breeding, MD  07/13/2022 9:28 PM    [facility]

## 2022-07-15 ENCOUNTER — Encounter: Payer: Self-pay | Admitting: Cardiology

## 2022-07-15 ENCOUNTER — Ambulatory Visit: Payer: Managed Care, Other (non HMO) | Attending: Cardiology | Admitting: Cardiology

## 2022-07-15 VITALS — BP 119/74 | HR 96 | Ht 70.0 in | Wt 177.4 lb

## 2022-07-15 DIAGNOSIS — E118 Type 2 diabetes mellitus with unspecified complications: Secondary | ICD-10-CM | POA: Diagnosis not present

## 2022-07-15 DIAGNOSIS — R931 Abnormal findings on diagnostic imaging of heart and coronary circulation: Secondary | ICD-10-CM

## 2022-07-15 DIAGNOSIS — R002 Palpitations: Secondary | ICD-10-CM | POA: Diagnosis not present

## 2022-07-15 DIAGNOSIS — E785 Hyperlipidemia, unspecified: Secondary | ICD-10-CM

## 2022-07-15 NOTE — Patient Instructions (Signed)
Medication Instructions:  Continue same medications *If you need a refill on your cardiac medications before your next appointment, please call your pharmacy*   Lab Work: None ordered   Testing/Procedures: None ordered   Follow-Up: At [facility], you and your health needs are our priority.  As part of our continuing mission to provide you with exceptional heart care, we have created designated Provider Care Teams.  These Care Teams include your primary Cardiologist (physician) and Advanced Practice Providers (APPs -  Physician Assistants and Nurse Practitioners) who all work together to provide you with the care you need, when you need it.  We recommend signing up for the patient portal called "MyChart".  Sign up information is provided on this After Visit Summary.  MyChart is used to connect with patients for Virtual Visits (Telemedicine).  Patients are able to view lab/test results, encounter notes, upcoming appointments, etc.  Non-urgent messages can be sent to your provider as well.   To learn more about what you can do with MyChart, go to [email or web address].    Your next appointment:  18 months  Call in Feb to schedule June 2025 appointment     The format for your next appointment: Office     Provider:  Dr.Hochrein   Important Information About Sugar

## 2022-07-28 ENCOUNTER — Other Ambulatory Visit: Payer: Self-pay | Admitting: Surgery

## 2022-08-04 ENCOUNTER — Other Ambulatory Visit: Payer: Self-pay

## 2022-08-04 ENCOUNTER — Encounter
Admission: RE | Admit: 2022-08-04 | Discharge: 2022-08-04 | Disposition: A | Discharge status: Home / Self Care | Payer: Managed Care, Other (non HMO) | Source: Ambulatory Visit | Attending: Surgery | Admitting: Surgery

## 2022-08-04 VITALS — BP 118/75 | HR 78 | Temp 98.0°F | Resp 20

## 2022-08-04 DIAGNOSIS — Z01818 Encounter for other preprocedural examination: Secondary | ICD-10-CM | POA: Insufficient documentation

## 2022-08-04 DIAGNOSIS — E785 Hyperlipidemia, unspecified: Secondary | ICD-10-CM | POA: Diagnosis not present

## 2022-08-04 LAB — CBC
HCT: 33.2 % — ABNORMAL LOW (ref 39.0–52.0)
Hemoglobin: 10.6 g/dL — ABNORMAL LOW (ref 13.0–17.0)
MCH: 28.1 pg (ref 26.0–34.0)
MCHC: 31.9 g/dL (ref 30.0–36.0)
MCV: 88.1 fL (ref 80.0–100.0)
Platelets: 278 10*3/uL (ref 150–400)
RBC: 3.77 MIL/uL — ABNORMAL LOW (ref 4.22–5.81)
RDW: 16.2 % — ABNORMAL HIGH (ref 11.5–15.5)
WBC: 5.9 10*3/uL (ref 4.0–10.5)
nRBC: 0 % (ref 0.0–0.2)

## 2022-08-04 LAB — BASIC METABOLIC PANEL
Anion gap: 5 (ref 5–15)
BUN: 26 mg/dL — ABNORMAL HIGH (ref 6–20)
CO2: 27 mmol/L (ref 22–32)
Calcium: 9.6 mg/dL (ref 8.9–10.3)
Chloride: 109 mmol/L (ref 98–111)
Creatinine, Ser: 1.2 mg/dL (ref 0.61–1.24)
GFR, Estimated: >60 mL/min (ref >60)
Glucose, Bld: 78 mg/dL (ref 70–99)
Potassium: 4 mmol/L (ref 3.5–5.1)
Sodium: 141 mmol/L (ref 135–145)

## 2022-08-04 NOTE — Patient Instructions (Signed)
Your procedure is scheduled on: Oct 4 ,2023  Report to the Registration Desk on the 1st floor of the [facility]. To find out your arrival time, please call [phone] between 1PM - 3PM on: Oct 3 , 2023  If your arrival time is 6:00 am, do not arrive prior to that time as the [facility] entrance doors do not open until 6:00 am.  REMEMBER: Instructions that are not followed completely may result in serious medical risk, up to and including death; or upon the discretion of your surgeon and anesthesiologist your surgery may need to be rescheduled.  Do not eat food after midnight the night before surgery.  No gum chewing, lozengers or hard candies.  Type 1 and Type 2 diabetics should only drink water and the G2 given to you.   In addition, your doctor has ordered for you to drink the provided  Gatorade G2 Drinking this carbohydrate drink up to two hours before surgery helps to reduce insulin resistance and improve patient outcomes. Please complete drinking 2 hours prior to scheduled arrival time.  TAKE THESE MEDICATIONS THE MORNING OF SURGERY WITH A SIP OF WATER: Lipitor Bupropion Calcium citrate Zetia Fenofibrate Vascepa Tramadol Zanaflex 9. Gabapentin  10. Protonix- one at night and in the am of surgery.   Use inhalers as prescribed on the day of surgery and bring albuterol to the hospital.   **Follow new guidelines for insulin and diabetes medications.** Hold metformin two days prior to surgery Do not take  glimiride on day of surgery Do not take actos on day of surgery Do not take januvia on day of surgery  Follow recommendations from Cardiologist, Pulmonologist or PCP regarding stopping Aspirin, Coumadin, Plavix, Eliquis, Pradaxa, or Pletal.  One week prior to surgery: Stop Anti-inflammatories (NSAIDS) such as Advil, Aleve, Ibuprofen, Motrin, Naproxen, Naprosyn and Aspirin based products such as Excedrin, Goodys Powder, BC Powder and meloxicam Stop ANY OVER THE  COUNTER supplements until after surgery unless it is a prescription.  You may however, continue to take Tylenol if needed for pain up until the day of surgery.  No Alcohol for 24 hours before or after surgery.  No Smoking including e-cigarettes for 24 hours prior to surgery.  No chewable tobacco products for at least 6 hours prior to surgery.  No nicotine patches on the day of surgery.  Do not use any "recreational" drugs for at least a week prior to your surgery.  Please be advised that the combination of cocaine and anesthesia may have negative outcomes, up to and including death. If you test positive for cocaine, your surgery will be cancelled.  On the morning of surgery brush your teeth with toothpaste and water, you may rinse your mouth with mouthwash if you wish. Do not swallow any toothpaste or mouthwash.  Use CHG Soap or wipes as directed on instruction sheet.  Do not wear jewelry, make-up, hairpins, clips or nail polish.  Do not wear lotions, powders, or perfumes.   Do not shave body from the neck down 48 hours prior to surgery just in case you cut yourself which could leave a site for infection.  Also, freshly shaved skin may become irritated if using the CHG soap.  Contact lenses, hearing aids and dentures may not be worn into surgery.  Do not bring valuables to the hospital. [facility] is not responsible for any missing/lost belongings or valuables.    Bring your C-PAP to the hospital with you in case you may have to  spend the night.   Notify your doctor if there is any change in your medical condition (cold, fever, infection).  Wear comfortable clothing (specific to your surgery type) to the hospital.  After surgery, you can help prevent lung complications by doing breathing exercises.  Take deep breaths and cough every 1-2 hours. Your doctor may order a device called an Incentive Spirometer to help you take deep breaths. If you are being admitted to the hospital  overnight, leave your suitcase in the car. After surgery it may be brought to your room.  If you are being discharged the day of surgery, you will not be allowed to drive home. You will need a responsible adult (18 years or older) to drive you home and stay with you that night.   If you are taking public transportation, you will need to have a responsible adult (18 years or older) with you. Please confirm with your physician that it is acceptable to use public transportation.   Please call the [facility]. at [phone] if you have any questions about these instructions.  Surgery Visitation Policy:  Patients undergoing a surgery or procedure may have two family members or support persons with them as long as the person is not COVID-19 positive or experiencing its symptoms.   Inpatient Visitation:    Visiting hours are 7 a.m. to 8 p.m. Up to four visitors are allowed at one time in a patient room, including children. The visitors may rotate out with other people during the day. One designated support person (adult) may remain overnight. Your procedure is scheduled on: Report to the Registration Desk on the 1st floor of the [facility]. To find out your arrival time, please call [phone] between 1PM - 3PM on: If your arrival time is 6:00 am, do not arrive prior to that time as the [facility] entrance doors do not open until 6:00 am.  Visiting hours are 7 a.m. to 8 p.m. Up to four visitors are allowed at one time in a patient room, including children. The visitors may rotate out with other people during the day. One designated support person (adult) may remain overnight.

## 2022-08-12 ENCOUNTER — Other Ambulatory Visit: Payer: Self-pay

## 2022-08-12 ENCOUNTER — Encounter: Payer: Self-pay | Admitting: Surgery

## 2022-08-12 ENCOUNTER — Ambulatory Visit: Payer: Managed Care, Other (non HMO) | Admitting: Urgent Care

## 2022-08-12 ENCOUNTER — Encounter
Admission: RE | Disposition: A | Discharge status: Home / Self Care | Payer: Self-pay | Source: Home / Self Care | Attending: Surgery

## 2022-08-12 ENCOUNTER — Ambulatory Visit
Admission: RE | Admit: 2022-08-12 | Discharge: 2022-08-12 | Disposition: A | Discharge status: Home / Self Care | Payer: Managed Care, Other (non HMO) | Attending: Surgery | Admitting: Surgery

## 2022-08-12 DIAGNOSIS — G4730 Sleep apnea, unspecified: Secondary | ICD-10-CM | POA: Insufficient documentation

## 2022-08-12 DIAGNOSIS — G709 Myoneural disorder, unspecified: Secondary | ICD-10-CM | POA: Insufficient documentation

## 2022-08-12 DIAGNOSIS — K219 Gastro-esophageal reflux disease without esophagitis: Secondary | ICD-10-CM | POA: Diagnosis not present

## 2022-08-12 DIAGNOSIS — E119 Type 2 diabetes mellitus without complications: Secondary | ICD-10-CM | POA: Diagnosis not present

## 2022-08-12 DIAGNOSIS — M67431 Ganglion, right wrist: Secondary | ICD-10-CM | POA: Diagnosis present

## 2022-08-12 DIAGNOSIS — I10 Essential (primary) hypertension: Secondary | ICD-10-CM | POA: Diagnosis not present

## 2022-08-12 DIAGNOSIS — I2510 Atherosclerotic heart disease of native coronary artery without angina pectoris: Secondary | ICD-10-CM | POA: Insufficient documentation

## 2022-08-12 DIAGNOSIS — E118 Type 2 diabetes mellitus with unspecified complications: Secondary | ICD-10-CM

## 2022-08-12 DIAGNOSIS — E785 Hyperlipidemia, unspecified: Secondary | ICD-10-CM

## 2022-08-12 HISTORY — PX: GANGLION CYST EXCISION: SHX1691

## 2022-08-12 LAB — GLUCOSE, CAPILLARY
Glucose-Capillary: 110 mg/dL — ABNORMAL HIGH (ref 70–99)
Glucose-Capillary: 112 mg/dL — ABNORMAL HIGH (ref 70–99)

## 2022-08-12 SURGERY — EXCISION, GANGLION CYST, WRIST
Anesthesia: General | Site: Wrist | Laterality: Right

## 2022-08-12 MED ORDER — MIDAZOLAM HCL 2 MG/2ML IJ SOLN
INTRAMUSCULAR | Status: AC
  Filled 2022-08-12: qty 2

## 2022-08-12 MED ORDER — ONDANSETRON HCL 4 MG/2ML IJ SOLN
INTRAMUSCULAR | Status: DC | PRN
  Administered 2022-08-12: 4 mg via INTRAVENOUS

## 2022-08-12 MED ORDER — METOCLOPRAMIDE HCL 5 MG/ML IJ SOLN: 5.0000 mg | Freq: Three times a day (TID) | INTRAMUSCULAR | Status: DC | PRN

## 2022-08-12 MED ORDER — LIDOCAINE HCL (PF) 2 % IJ SOLN
INTRAMUSCULAR | Status: AC
  Filled 2022-08-12: qty 5

## 2022-08-12 MED ORDER — MIDAZOLAM HCL 2 MG/2ML IJ SOLN
INTRAMUSCULAR | Status: DC | PRN
  Administered 2022-08-12: 2 mg via INTRAVENOUS

## 2022-08-12 MED ORDER — CEFAZOLIN SODIUM-DEXTROSE 2-4 GM/100ML-% IV SOLN
2.0000 g | INTRAVENOUS | Status: AC
  Administered 2022-08-12: 2 g via INTRAVENOUS

## 2022-08-12 MED ORDER — BUPIVACAINE HCL (PF) 0.5 % IJ SOLN
INTRAMUSCULAR | Status: DC | PRN
  Administered 2022-08-12: 5 mL

## 2022-08-12 MED ORDER — OXYCODONE HCL 5 MG PO TABS: 5.0000 mg | ORAL_TABLET | Freq: Once | ORAL | Status: DC | PRN

## 2022-08-12 MED ORDER — PROPOFOL 500 MG/50ML IV EMUL
INTRAVENOUS | Status: DC | PRN
  Administered 2022-08-12: 50 mg via INTRAVENOUS
  Administered 2022-08-12: 100 ug/kg/min via INTRAVENOUS

## 2022-08-12 MED ORDER — 0.9 % SODIUM CHLORIDE (POUR BTL) OPTIME
TOPICAL | Status: DC | PRN
  Administered 2022-08-12: 500 mL

## 2022-08-12 MED ORDER — GLIMEPIRIDE 2 MG PO TABS: 2.0000 mg | ORAL_TABLET | Freq: Two times a day (BID) | ORAL | Status: AC

## 2022-08-12 MED ORDER — KETOROLAC TROMETHAMINE 30 MG/ML IJ SOLN
INTRAMUSCULAR | Status: AC
  Filled 2022-08-12: qty 1

## 2022-08-12 MED ORDER — CHLORHEXIDINE GLUCONATE 0.12 % MT SOLN: 15.0000 mL | Freq: Once | OROMUCOSAL | Status: AC

## 2022-08-12 MED ORDER — BUPIVACAINE HCL (PF) 0.5 % IJ SOLN
INTRAMUSCULAR | Status: AC
  Filled 2022-08-12: qty 30

## 2022-08-12 MED ORDER — FENTANYL CITRATE (PF) 100 MCG/2ML IJ SOLN
INTRAMUSCULAR | Status: AC
  Filled 2022-08-12: qty 2

## 2022-08-12 MED ORDER — METOCLOPRAMIDE HCL 10 MG PO TABS: 5.0000 mg | ORAL_TABLET | Freq: Three times a day (TID) | ORAL | Status: DC | PRN

## 2022-08-12 MED ORDER — ONDANSETRON HCL 4 MG/2ML IJ SOLN
INTRAMUSCULAR | Status: AC
  Filled 2022-08-12: qty 2

## 2022-08-12 MED ORDER — ONDANSETRON HCL 4 MG/2ML IJ SOLN: 4.0000 mg | Freq: Four times a day (QID) | INTRAMUSCULAR | Status: DC | PRN

## 2022-08-12 MED ORDER — TRAMADOL HCL 50 MG PO TABS: 50.0000 mg | ORAL_TABLET | Freq: Four times a day (QID) | ORAL | Status: DC | PRN

## 2022-08-12 MED ORDER — SODIUM CHLORIDE 0.9 % IV SOLN: INTRAVENOUS | Status: DC

## 2022-08-12 MED ORDER — PROPOFOL 1000 MG/100ML IV EMUL
INTRAVENOUS | Status: AC
  Filled 2022-08-12: qty 100

## 2022-08-12 MED ORDER — LIDOCAINE HCL (CARDIAC) PF 100 MG/5ML IV SOSY
PREFILLED_SYRINGE | INTRAVENOUS | Status: DC | PRN
  Administered 2022-08-12: 100 mg via INTRAVENOUS

## 2022-08-12 MED ORDER — FENTANYL CITRATE (PF) 100 MCG/2ML IJ SOLN: 25.0000 ug | INTRAMUSCULAR | Status: DC | PRN

## 2022-08-12 MED ORDER — ORAL CARE MOUTH RINSE: 15.0000 mL | Freq: Once | OROMUCOSAL | Status: AC

## 2022-08-12 MED ORDER — OXYCODONE HCL 5 MG/5ML PO SOLN: 5.0000 mg | Freq: Once | ORAL | Status: DC | PRN

## 2022-08-12 MED ORDER — CEFAZOLIN SODIUM-DEXTROSE 2-4 GM/100ML-% IV SOLN
INTRAVENOUS | Status: AC
  Filled 2022-08-12: qty 100

## 2022-08-12 MED ORDER — CHLORHEXIDINE GLUCONATE 0.12 % MT SOLN
OROMUCOSAL | Status: AC
  Administered 2022-08-12: 15 mL via OROMUCOSAL
  Filled 2022-08-12: qty 15

## 2022-08-12 MED ORDER — ONDANSETRON HCL 4 MG PO TABS: 4.0000 mg | ORAL_TABLET | Freq: Four times a day (QID) | ORAL | Status: DC | PRN

## 2022-08-12 MED ORDER — FENTANYL CITRATE (PF) 100 MCG/2ML IJ SOLN
INTRAMUSCULAR | Status: DC | PRN
  Administered 2022-08-12: 25 ug via INTRAVENOUS
  Administered 2022-08-12: 50 ug via INTRAVENOUS
  Administered 2022-08-12: 25 ug via INTRAVENOUS

## 2022-08-12 SURGICAL SUPPLY — 29 items
APL PRP STRL LF DISP 70% ISPRP (MISCELLANEOUS) ×2
BNDG CMPR 5X4 CHSV STRCH STRL (GAUZE/BANDAGES/DRESSINGS) ×1
BNDG COHESIVE 4X5 TAN STRL LF (GAUZE/BANDAGES/DRESSINGS) ×1 IMPLANT
BNDG ELASTIC 2X5.8 VLCR STR LF (GAUZE/BANDAGES/DRESSINGS) ×1 IMPLANT
BNDG ESMARK 4X12 TAN STRL LF (GAUZE/BANDAGES/DRESSINGS) ×1 IMPLANT
CHLORAPREP W/TINT 26 (MISCELLANEOUS) ×2 IMPLANT
CORD BIP STRL DISP 12FT (MISCELLANEOUS) ×1 IMPLANT
CUFF TOURN SGL QUICK 18X4 (TOURNIQUET CUFF) IMPLANT
FORCEPS JEWEL BIP 4-3/4 STR (INSTRUMENTS) ×1 IMPLANT
GAUZE SPONGE 4X4 12PLY STRL (GAUZE/BANDAGES/DRESSINGS) ×1 IMPLANT
GAUZE XEROFORM 1X8 LF (GAUZE/BANDAGES/DRESSINGS) ×1 IMPLANT
GLOVE BIO SURGEON STRL SZ8 (GLOVE) ×1 IMPLANT
GLOVE SURG UNDER LTX SZ8 (GLOVE) ×1 IMPLANT
GOWN STRL REUS W/ TWL XL LVL3 (GOWN DISPOSABLE) ×1 IMPLANT
GOWN STRL REUS W/TWL XL LVL3 (GOWN DISPOSABLE) ×1
KIT TURNOVER KIT A (KITS) ×1 IMPLANT
MANIFOLD NEPTUNE II (INSTRUMENTS) ×1 IMPLANT
NS IRRIG 500ML POUR BTL (IV SOLUTION) ×1 IMPLANT
PACK EXTREMITY ARMC (MISCELLANEOUS) ×1 IMPLANT
SPLINT WRIST LG LT TX990309 (SOFTGOODS) IMPLANT
SPLINT WRIST LG RT TX900304 (SOFTGOODS) IMPLANT
SPLINT WRIST M LT TX990308 (SOFTGOODS) IMPLANT
SPLINT WRIST M RT TX990303 (SOFTGOODS) IMPLANT
STOCKINETTE IMPERVIOUS 9X36 MD (GAUZE/BANDAGES/DRESSINGS) ×1 IMPLANT
SUT PROLENE 4 0 PS 2 18 (SUTURE) ×1 IMPLANT
SUT VIC AB 4-0 SH 27 (SUTURE) ×1
SUT VIC AB 4-0 SH 27XANBCTRL (SUTURE) IMPLANT
TRAP FLUID SMOKE EVACUATOR (MISCELLANEOUS) ×1 IMPLANT
WATER STERILE IRR 500ML POUR (IV SOLUTION) ×1 IMPLANT

## 2022-08-12 NOTE — Anesthesia Preprocedure Evaluation (Signed)
Anesthesia Evaluation  Patient identified by MRN, date of birth, ID band Patient awake    Reviewed: Allergy & Precautions, NPO status , Patient's Chart, lab work & pertinent test results  History of Anesthesia Complications Negative for: history of anesthetic complications  Airway Mallampati: III  TM Distance: <3 FB Neck ROM: full    Dental  (+) Chipped, Poor Dentition   Pulmonary asthma , sleep apnea and Continuous Positive Airway Pressure Ventilation ,    Pulmonary exam normal        Cardiovascular Exercise Tolerance: Good hypertension, (-) angina+ CAD  (-) Past MI Normal cardiovascular exam     Neuro/Psych  Neuromuscular disease negative psych ROS   GI/Hepatic Neg liver ROS, GERD  Controlled,  Endo/Other  diabetes, Type 2  Renal/GU Renal disease  negative genitourinary   Musculoskeletal   Abdominal   Peds  Hematology negative hematology ROS (+)   Anesthesia Other Findings Past Medical History: No date: Anemia No date: Arthritis No date: Asthma     Comment:  sports induced asthma. takes inhalers when needed No date: Barrett's esophagus without dysplasia No date: Cancer [facility])     Comment:  Basal and squamoous cell No date: Chronic kidney disease No date: Complication of anesthesia     Comment:  high tolerance to pain medication. body absorbs pain med              quickly No date: Coronary artery disease No date: Cough on exercise No date: Diabetes mellitus without complication (HCC) No date: Diverticulosis No date: Erectile dysfunction No date: Fatty liver No date: GERD (gastroesophageal reflux disease) No date: History of kidney stones No date: Hyperlipidemia No date: Hypertension     Comment:  per patient, he does not have high bp but is treated for              his kidneys and his diabetes No date: Pancreatitis No date: Right shoulder injury No date: Sleep apnea     Comment:  use C-PAP  Past  Surgical History: No date: ANKLE GANGLION CYST EXCISION; Left     Comment:  x 5 10/03/2018: CARPAL TUNNEL RELEASE; Right     Comment:  Procedure: CARPAL TUNNEL RELEASE;  Surgeon: Earnestine Leys, MD;  Location: [facility];  Service: Orthopedics;                Laterality: Right; 10/24/2018: CARPAL TUNNEL RELEASE; Left     Comment:  Procedure: CARPAL TUNNEL RELEASE;  Surgeon: Earnestine Leys, MD;  Location: [facility];  Service: Orthopedics;                Laterality: Left; No date: COLONOSCOPY 08/30/2019: COLONOSCOPY WITH PROPOFOL; N/A     Comment:  Procedure: COLONOSCOPY WITH PROPOFOL;  Surgeon: [place],               Benay Pike, MD;  Location: [facility];  Service:               Gastroenterology;  Laterality: N/A; 06/05/2016: CYSTOSCOPY WITH STENT PLACEMENT; Bilateral     Comment:  Procedure: CYSTOSCOPY WITH STENT PLACEMENT;  Surgeon:               Nickie Retort, MD;  Location: [facility];  Service:  Urology;  Laterality: Bilateral; 04/13/2016: CYSTOSCOPY/URETEROSCOPY/HOLMIUM LASER/STENT PLACEMENT;  Left     Comment:  Procedure: CYSTOSCOPY/RETROGRADE PYELOGRAM/URETEROSCOPY               WITH HOLMIUM LASER LITHOTRIPSY//STENT PLACEMENT;                Surgeon: Festus Aloe, MD;  Location: [facility];                Service: Urology;  Laterality: Left; 12/27/2015: ESOPHAGOGASTRODUODENOSCOPY (EGD) WITH PROPOFOL; N/A     Comment:  Procedure: ESOPHAGOGASTRODUODENOSCOPY (EGD) WITH               PROPOFOL;  Surgeon: Manya Silvas, MD;  Location: [facility]              ENDOSCOPY;  Service: Endoscopy;  Laterality: N/A; 08/30/2019: ESOPHAGOGASTRODUODENOSCOPY (EGD) WITH PROPOFOL; N/A     Comment:  Procedure: ESOPHAGOGASTRODUODENOSCOPY (EGD) WITH               PROPOFOL;  Surgeon: Toledo, Benay Pike, MD;  Location:               [facility];  Service: Gastroenterology;  Laterality:               N/A; 12/30/2017: EXTRACORPOREAL SHOCK WAVE LITHOTRIPSY;  Right     Comment:  Procedure: EXTRACORPOREAL SHOCK WAVE LITHOTRIPSY (ESWL);              Surgeon: Royston Cowper, MD;  Location: [facility];                Service: Urology;  Laterality: Right; 03/19/2022: EXTRACORPOREAL SHOCK WAVE LITHOTRIPSY; Left     Comment:  Procedure: EXTRACORPOREAL SHOCK WAVE LITHOTRIPSY (ESWL);              Surgeon: Royston Cowper, MD;  Location: [facility];                Service: Urology;  Laterality: Left; 2007: HERNIA REPAIR     Comment:  umbilical 3235: KNEE ARTHROSCOPY; Right     Comment:  had bursa sack repaired 10/11/2020: KNEE ARTHROSCOPY WITH MEDIAL MENISECTOMY; Right     Comment:  Procedure: Right knee arthroscopy partial medial               meniscectomy;  Surgeon: Leim Fabry, MD;  Location:               [place];  Service: Orthopedics;  Laterality:               Right;  Diabetic - oral meds sleep apnea 11/18/2018: KNEE ARTHROSCOPY WITH MENISCAL REPAIR; Right     Comment:  Procedure: KNEE ARTHROSCOPY WITH MENISCAL REPAIR AND               CHONDROPLASTY;  Surgeon: Leim Fabry, MD;  Location:               [place];  Service: Orthopedics;  Laterality:               Right;  Diabetic - oral meds sleep apnea SUPINE WITH               ACL LEG HOLDER SMITH AND NEWPHEW [place] 2011: SHOULDER SURGERY; Right     Comment:  tendon was shredded and was trimmed, repaired and               reattached. Screws in shoulder 10/28/2021: TOTAL HIP ARTHROPLASTY; Left  Comment:  Procedure: TOTAL HIP ARTHROPLASTY;  Surgeon: Corky Mull, MD;  Location: [facility];  Service: Orthopedics;                Laterality: Left; 06/05/2016: URETEROSCOPY WITH HOLMIUM LASER LITHOTRIPSY; Bilateral     Comment:  Procedure: URETEROSCOPY WITH HOLMIUM LASER LITHOTRIPSY;               Surgeon: Nickie Retort, MD;  Location: [facility];                Service: Urology;  Laterality: Bilateral; 2002: VASECTOMY  BMI    Body Mass Index: 26.20  kg/m      Reproductive/Obstetrics negative OB ROS                             Anesthesia Physical Anesthesia Plan  ASA: 3  Anesthesia Plan: General   Post-op Pain Management:    Induction: Intravenous  PONV Risk Score and Plan: Propofol infusion and TIVA  Airway Management Planned: Natural Airway and Nasal Cannula  Additional Equipment:   Intra-op Plan:   Post-operative Plan:   Informed Consent: I have reviewed the patients History and Physical, chart, labs and discussed the procedure including the risks, benefits and alternatives for the proposed anesthesia with the patient or authorized representative who has indicated his/her understanding and acceptance.     Dental Advisory Given  Plan Discussed with: Anesthesiologist, CRNA and Surgeon  Anesthesia Plan Comments: (Patient consented for risks of anesthesia including but not limited to:  - adverse reactions to medications - risk of airway placement if required - damage to eyes, teeth, lips or other oral mucosa - nerve damage due to positioning  - sore throat or hoarseness - Damage to heart, brain, nerves, lungs, other parts of body or loss of life  Patient voiced understanding.)        Anesthesia Quick Evaluation

## 2022-08-12 NOTE — Anesthesia Postprocedure Evaluation (Signed)
Anesthesia Post Note  Patient: [name]  Procedure(s) Performed: EXCISION OF DORSAL CARPAL GANGLION OF RIGHT WRIST (Right: Wrist)  Patient location during evaluation: PACU Anesthesia Type: General Level of consciousness: awake and alert Pain management: pain level controlled Vital Signs Assessment: post-procedure vital signs reviewed and stable Respiratory status: spontaneous breathing, nonlabored ventilation, respiratory function stable and patient connected to nasal cannula oxygen Cardiovascular status: blood pressure returned to baseline and stable Postop Assessment: no apparent nausea or vomiting Anesthetic complications: no   No notable events documented.   Last Vitals:  Vitals:   08/12/22 1145 08/12/22 1158  BP: 108/69 102/71  Pulse: 70 65  Resp: 16 14  Temp: (!) 36.1 C (!) 36.3 C  SpO2: 96% 95%    Last Pain:  Vitals:   08/12/22 1158  TempSrc: Temporal  PainSc: 0-No pain                 Precious Haws Stephania Macfarlane

## 2022-08-12 NOTE — H&P (Signed)
History of Present Illness:  Jesse Alvarado is a 56 y.o. 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 The patient notes that the procedure provided complete relief of his symptoms for several weeks but began to develop some increased discomfort in mid June. The mass recurred about 1 month ago. He again describes mild to moderate tenderness in the wrist area, especially with repetitive activities. He has been taking Tylenol as necessary with limited benefit. He denies any reinjury to the wrist, and denies any numbness or paresthesias to his hand. The patient would like to consider more aggressive treatment options for this problem at this time.  Current Outpatient Medications: atorvastatin (LIPITOR) 80 MG tablet Take 80 mg by mouth once  azelastine-fluticasone 137-50 mcg/spray [place] into one nostril 2 (two) times daily  benzonatate (TESSALON) 200 MG capsule Take 200 mg by mouth 3 (three) times daily as needed for Cough  bergamot extract (CITRUS BERGAMOT) 500 mg Cap Take by mouth  calcium citrate-vitamin D3 (CITRACAL+D) 315-200 mg-unit tablet Take 1 tablet by mouth 3 (three) times daily with meals  ciclopirox (PENLAC) 8 % topical nail solution Apply topically nightly 6.6 mL 3  cranberry 500 mg Cap Take by mouth  cyanocobalamin (VITAMIN B12) 1,000 mcg/mL injection Inject 1 mL (1,000 mcg total) into the muscle monthly 1 mL 11  DYMISTA 137-50 mcg/spray [place] into one nostril 2 (two) times daily  ezetimibe (ZETIA) 10 mg tablet Take by mouth  fenofibrate nanocrystallized (TRICOR) 145 MG tablet TAKE ONE TABLET BY MOUTH ONE TIME DAILY 90 tablet 3  ferrous gluconate (FERGON) 324 MG tablet TAKE ONE TABLET BY MOUTH THREE TIMES A DAY WITH FOOD 270 tablet 1  folic acid (FOLVITE) 1 MG tablet TAKE ONE TABLET BY MOUTH ONE TIME DAILY 90 tablet 1  FREESTYLE LIBRE 2 SENSOR kit Use 1 kit every 14 (fourteen) days for glucose  monitoring 2 kit 12  gabapentin (NEURONTIN) 300 MG capsule Take 300 mg by mouth  glimepiride (AMARYL) 2 MG tablet TAKE ONE TABLET BY MOUTH TWICE A DAY 180 tablet 4  ipratropium (ATROVENT) 0.06 % nasal spray Place 2 sprays into both nostrils 2 (two) times daily 3  levocetirizine (XYZAL) 5 MG tablet Take 5 mg by mouth every evening  losartan (COZAAR) 50 MG tablet Take 1 tablet (50 mg total) by mouth once daily 90 tablet 3  metFORMIN (GLUCOPHAGE) 850 MG tablet TAKE ONE TABLET BY MOUTH THREE TIMES A DAY 270 tablet 3  pantoprazole (PROTONIX) 40 MG DR tablet Take 1 tablet (40 mg total) by mouth 2 (two) times daily 180 tablet 1  pioglitazone (ACTOS) 30 MG tablet TAKE ONE TABLET BY MOUTH ONE TIME DAILY 90 tablet 1  pravastatin (PRAVACHOL) 40 MG tablet Take 40 mg by mouth at bedtime  silodosin 4 mg Cap TAKE ONE CAPSULE BY MOUTH ONE TIME DAILY 30 capsule 6  SITagliptin phosphate (JANUVIA) 100 MG tablet Take 1 tablet (100 mg total) by mouth once daily 30 tablet 6  sucralfate (CARAFATE) 1 gram tablet TAKE ONE TABLET BY MOUTH FOUR TIMES A DAY BEFORE MEALS AND AT NIGHT 360 tablet 1  syringe with needle (SYRINGE 3CC/25GX1") 3 mL 25 gauge x 1" Syrg Use 1 each monthly. Dx E53.8 100 Syringe 1  tadalafiL (CIALIS) 5 MG tablet Take 5 mg by mouth once daily  tiZANidine (ZANAFLEX) 2 MG tablet Take 1 tablet (2 mg total) by mouth 3 (three) times daily as needed  May take two at at time if needed. 60 tablet 0  traMADoL (ULTRAM) 50 mg tablet Take 1 tablet (50 mg total) by mouth every 8 (eight) hours as needed for Pain 21 tablet 0  TURMERIC ORAL Take 750 mg by mouth  VASCEPA 1 gram Cap Take 2 g by mouth 2 (two) times daily   Allergies:  Ciprocinonide Other (GI upset)  Ciprofloxacin Other (GI upset)  Monosodium Glutamate (Diarrhea)  Trulicity [Dulaglutide] Other (Caused pancreatitis)   Past Medical History:  Anemia  Barrett's esophagus without dysplasia 01/19/2019  Diabetes mellitus type 2, uncomplicated (CMS-HCC)   diagnosed 2005  Erectile dysfunction  FH: colon cancer 01/19/2019  FH: colon polyps 01/19/2019  GERD (gastroesophageal reflux disease)  Hyperlipidemia  Hypertension  Pancreatitis  Right shoulder injury 2010  with rupture of biceps tendon   Past Surgical History:  Right shoulder surgery for ruptured biceps tendon 2010  COLONOSCOPY 05/10/2014  FHCC (Brother), FH Colon Polyps (Father): CBF 05/2019: Recall ltr mailed  Excision, right prepatellar bursa and ostectomy, Osgood-Schlatter lesion. Right 10/02/2014 (Dr. Rudene Christians)  EGD 12/27/2015 (Barrett's Esophagus: CBF 12/2018: Recall ltr mailed)  KNEE ARTHROSCOPY Right 11/18/2018  RIGHT KNEE ARTHROSCOPY, MEDIAL MENISCUS REPAIR (HORIZONTAL TEAR OF POSTERIOR HORN), RIGHT KNEE CHONDROPLASTY OF PATELLA AND MEDIAL FEMORAL CONDYLE  COLONOSCOPY 08/30/2019 (Diverticulosis/FHx CC-Brother/Repeat 25yr/TKT)  EGD 08/30/2019 (Barrett's Esophagus/Repeat 324yrTKT)  Left total hip arthroplasty Left 10/28/2021 (Dr. PoRoland Rack knee surgery 1276/1/9509UMBILICAL HERNIA REPAIR   Family History:  Cancer Brother  Colon cancer Brother  Lung cancer Brother  Skin cancer Father  No Known Problems Mother  Asthma Other  Diabetes type II Other   Social History:   Socioeconomic History:  Marital status: Married  Occupational History  Occupation: meCounsellorTobacco Use  Smoking status: Never  Smokeless tobacco: Never  Vaping Use  Vaping Use: Never used  Substance and Sexual Activity  Alcohol use: No  Alcohol/week: 0.0 standard drinks  Drug use: No  Sexual activity: Yes  Partners: Female   Social Determinants of Health:  FiEmergency planning/management officertrain: Low Risk  Difficulty of Paying Living Expenses: Not hard at all  Food Insecurity: No Food Insecurity  Worried About RuCharity fundraisern the Last Year: Never true  RaArboriculturistn the Last Year: Never true  Transportation Needs: No Transportation Needs  Lack of Transportation (Medical): No  Lack of  Transportation (Non-Medical): No   Review of Systems:  A comprehensive 14 point ROS was performed, reviewed, and the pertinent orthopaedic findings are documented in the HPI.  Physical Exam: Vitals:  07/20/22 1204  BP: 120/76  Weight: 80.7 kg (178 lb)  Height: 175.3 cm (_0 )  PainSc: 4  PainLoc: Hand   General/Constitutional: The patient appears to be well-nourished, well-developed, and in no acute distress. Neuro/Psych: Normal mood and affect, oriented to person, place and time. Eyes: Non-icteric. Pupils are equal, round, and reactive to light, and exhibit synchronous movement. ENT: Unremarkable. Lymphatic: No palpable adenopathy. Respiratory: Lungs clear to auscultation, Normal chest excursion, No wheezes, and Non-labored breathing Cardiovascular: Regular rate and rhythm. No murmurs. and No edema, swelling or tenderness, except as noted in detailed exam. Integumentary: No impressive skin lesions present, except as noted in detailed exam. Musculoskeletal: Unremarkable, except as noted in detailed exam.  Right wrist exam: Skin inspection of the right wrist again is notable for a fluid-filled soft tissue mass over the dorsal aspect of the wrist measuring approximately 1.5 x 2.5 cm which is nontender to palpation.  There is no overlying erythema or ecchymosis. Actively, he demonstrates near full range of motion of his wrist without any pain or catching. He can actively flex and extend all digits fully without any pain or triggering. He remains neurovascularly intact to all digits.  Assessment: Ganglion cyst of wrist, right.   Plan: The treatment options were discussed with the patient. In addition, patient educational materials were provided regarding the diagnosis and treatment options. The patient is frustrated by the persistence of this cyst and is ready to consider more aggressive treatment options. Therefore, I have recommended a surgical procedure, specifically an excision of the  dorsal carpal ganglion cyst. The procedure was discussed with the patient, as were the potential risks (including bleeding, infection, nerve and/or blood vessel injury, persistent or recurrent pain, recurrence of the cyst, stiffness of the wrist, need for further surgery, blood clots, strokes, heart attacks and/or arhythmias, pneumonia, etc.) and benefits. The patient states his/her understanding and wishes to proceed. All of the patient's questions and concerns were answered. He can call any time with further concerns. He will follow up post-surgery, routine.    H&P reviewed and patient re-examined. No changes.

## 2022-08-12 NOTE — Op Note (Signed)
08/12/2022  11:15 AM  Patient:   Jesse Alvarado  Pre-Op Diagnosis:   Right dorsal carpal ganglion.  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  Procedure:   Extensor tenosynovectomy with debridement of EDC tendon, right wrist.  Surgeon:   Pascal Lux, MD  Anesthesia:   IV sedation  Findings:   As above.  Complications:   None  EBL:   0 cc  Fluids:   350 cc crystalloid  TT:   28 minutes at 250 mmHg  Drains:   None  Closure:   4-0 Vicryl subcuticular sutures  Brief Clinical Note:   The patient is a 56 year old male with a history of recurrent soft tissue mass on the dorsal aspect of his right wrist. His symptoms have persisted despite medications, activity modification, aspiration/injection, etc. His history and examination are consistent with a dorsal carpal ganglion of the right wrist. The patient presents at this time for excision of the right dorsal carpal ganglion.  Procedure:   The patient was brought into the operating room and lain in the supine position. After adequate IV sedation was achieved, the right hand and upper extremity were prepped with ChloraPrep solution before being draped sterilely. Preoperative antibiotics were administered. A timeout was performed to verify the appropriate surgical site before the limb was exsanguinated with an Esmarch and the tourniquet inflated to 250 mmHg.   An approximately 1.5-2 cm incision was made transversely over the cyst in line with the wrist extension crease centered over the mass. The incision was carried down through the subcutaneous tissues with care taken to identify and protect any neurovascular structures. The presumed dorsal carpal ganglion was encountered. However has dissection was carried out, is clear that the mass consisted of a fluid-filled portion of the extensor tenosynovium of the fourth dorsal compartment just distal to the extensor  retinaculum. A short portion of the extensor retinaculum was incised to permit further visualization. The redundant and chronically thickened tenosynovium was carefully debrided and excised. A portion of the the [facility] tendons were noted to be degeneratively torn with some longitudinal striation of the tendon. This degenerative tearing was considered to be too diffuse to permit any formal repair.  However, there was an area that was able to be debrided sharply and smoothed down with iris scissors. The EDC tendons were retracted radially and ulnarly in order to assess the dorsal carpal region. There is no evidence of any stalks or other soft tissue extension into the joint to suggest that the debrided tissue was consistent with a true dorsal carpal ganglion.  The wound was copiously irrigated with sterile saline solution before the skin was closed using 4-0 Vicryl subcuticular sutures. Benzoin and Steri-Strips were applied to the skin before a sterile bulky dressing was applied to the wrist. In addition, the wrist was placed into a Velcro splint for comfort as well as to immobilize the wrist. A total of 5 cc of 0.5% plain Sensorcaine was injected in and around the incision to help with postoperative analgesia prior to placement of the dressing. The patient was then awakened and returned to the recovery room in satisfactory condition after tolerating the procedure well.

## 2022-08-12 NOTE — Discharge Instructions (Addendum)
Orthopedic discharge instructions: Keep dressing dry and intact. Keep hand elevated above heart level. May shower after dressing removed on postop day 4 (Sunday). Cover sutures with Band-Aids after drying off, then reapply Velcro splint. Apply ice to affected area frequently. Resume meloxicam 15 mg daily.  Take ES Tylenol or pain medication as prescribed when needed.  Return for follow-up in 10-14 days or as scheduled.  AMBULATORY SURGERY  DISCHARGE INSTRUCTIONS   The drugs that you were given will stay in your system until tomorrow so for the next 24 hours you should not:  Drive an automobile Make any legal decisions Drink any alcoholic beverage   You may resume regular meals tomorrow.  Today it is better to start with liquids and gradually work up to solid foods.  You may eat anything you prefer, but it is better to start with liquids, then soup and crackers, and gradually work up to solid foods.   Please notify your doctor immediately if you have any unusual bleeding, trouble breathing, redness and pain at the surgery site, drainage, fever, or pain not relieved by medication.    Additional Instructions:        Please contact your physician with any problems or Same Day Surgery at [phone], Monday through Friday 6 am to 4 pm, or [place] at [facility] number at [phone].

## 2022-08-12 NOTE — Transfer of Care (Signed)
Immediate Anesthesia Transfer of Care Note  Patient: Jesse Alvarado  Procedure(s) Performed: EXCISION OF DORSAL CARPAL GANGLION OF RIGHT WRIST (Right: Wrist)  Patient Location: PACU  Anesthesia Type:General  Level of Consciousness: drowsy  Airway & Oxygen Therapy: Patient Spontanous Breathing and Patient connected to face mask oxygen  Post-op Assessment: Report given to RN and Post -op Vital signs reviewed and stable  Post vital signs: Reviewed and stable  Last Vitals:  Vitals Value Taken Time  BP 99/67 08/12/22 1115  Temp 36 C 08/12/22 1115  Pulse 72 08/12/22 1116  Resp 16 08/12/22 1116  SpO2 98 % 08/12/22 1116  Vitals shown include unvalidated device data.  Last Pain:  Vitals:   08/12/22 0840  TempSrc: Temporal  PainSc: 7          Complications: No notable events documented.

## 2022-08-13 ENCOUNTER — Encounter: Payer: Self-pay | Admitting: Surgery

## 2022-08-13 LAB — SURGICAL PATHOLOGY

## 2022-08-20 ENCOUNTER — Encounter: Payer: Self-pay | Admitting: Urology

## 2022-08-20 ENCOUNTER — Encounter
Admission: RE | Disposition: A | Discharge status: Home / Self Care | Payer: Self-pay | Source: Home / Self Care | Attending: Urology

## 2022-08-20 ENCOUNTER — Other Ambulatory Visit: Payer: Self-pay

## 2022-08-20 ENCOUNTER — Ambulatory Visit
Admission: RE | Admit: 2022-08-20 | Discharge: 2022-08-20 | Disposition: A | Discharge status: Home / Self Care | Payer: Managed Care, Other (non HMO) | Attending: Urology | Admitting: Urology

## 2022-08-20 DIAGNOSIS — Z7984 Long term (current) use of oral hypoglycemic drugs: Secondary | ICD-10-CM | POA: Diagnosis not present

## 2022-08-20 DIAGNOSIS — G4730 Sleep apnea, unspecified: Secondary | ICD-10-CM | POA: Diagnosis not present

## 2022-08-20 DIAGNOSIS — E119 Type 2 diabetes mellitus without complications: Secondary | ICD-10-CM | POA: Diagnosis not present

## 2022-08-20 DIAGNOSIS — J45909 Unspecified asthma, uncomplicated: Secondary | ICD-10-CM | POA: Insufficient documentation

## 2022-08-20 DIAGNOSIS — N200 Calculus of kidney: Secondary | ICD-10-CM | POA: Diagnosis present

## 2022-08-20 HISTORY — PX: EXTRACORPOREAL SHOCK WAVE LITHOTRIPSY: SHX1557

## 2022-08-20 LAB — GLUCOSE, CAPILLARY: Glucose-Capillary: 123 mg/dL — ABNORMAL HIGH (ref 70–99)

## 2022-08-20 SURGERY — LITHOTRIPSY, ESWL
Anesthesia: Moderate Sedation | Laterality: Right

## 2022-08-20 MED ORDER — MIDAZOLAM HCL 2 MG/2ML IJ SOLN
INTRAMUSCULAR | Status: AC
  Administered 2022-08-20: 1 mg via INTRAMUSCULAR
  Filled 2022-08-20: qty 2

## 2022-08-20 MED ORDER — PROMETHAZINE HCL 25 MG/ML IJ SOLN: 25.0000 mg | Freq: Once | INTRAMUSCULAR | Status: AC

## 2022-08-20 MED ORDER — DIPHENHYDRAMINE HCL 25 MG PO CAPS
ORAL_CAPSULE | ORAL | Status: AC
  Administered 2022-08-20: 25 mg via ORAL
  Filled 2022-08-20: qty 1

## 2022-08-20 MED ORDER — TAMSULOSIN HCL 0.4 MG PO CAPS: 0.4000 mg | ORAL_CAPSULE | Freq: Every day | ORAL | 1 refills | Status: AC

## 2022-08-20 MED ORDER — FUROSEMIDE 10 MG/ML IJ SOLN
10.0000 mg | Freq: Once | INTRAMUSCULAR | Status: AC
  Administered 2022-08-20: 10 mg via INTRAVENOUS

## 2022-08-20 MED ORDER — ACETAMINOPHEN-CODEINE 300-30 MG PO TABS: 1.0000 | ORAL_TABLET | ORAL | 1 refills | Status: DC | PRN

## 2022-08-20 MED ORDER — DEXTROSE-NACL 5-0.45 % IV SOLN: INTRAVENOUS | Status: DC

## 2022-08-20 MED ORDER — FUROSEMIDE 10 MG/ML IJ SOLN
INTRAMUSCULAR | Status: AC
  Filled 2022-08-20: qty 2

## 2022-08-20 MED ORDER — MORPHINE SULFATE (PF) 10 MG/ML IV SOLN: 10.0000 mg | Freq: Once | INTRAVENOUS | Status: AC

## 2022-08-20 MED ORDER — PROMETHAZINE HCL 25 MG/ML IJ SOLN
INTRAMUSCULAR | Status: AC
  Administered 2022-08-20: 25 mg via INTRAMUSCULAR
  Filled 2022-08-20: qty 1

## 2022-08-20 MED ORDER — SULFAMETHOXAZOLE-TRIMETHOPRIM 800-160 MG PO TABS: 1.0000 | ORAL_TABLET | Freq: Two times a day (BID) | ORAL | 0 refills | Status: DC

## 2022-08-20 MED ORDER — MIDAZOLAM HCL 2 MG/2ML IJ SOLN: 1.0000 mg | Freq: Once | INTRAMUSCULAR | Status: AC

## 2022-08-20 MED ORDER — MORPHINE SULFATE (PF) 10 MG/ML IV SOLN
INTRAVENOUS | Status: AC
  Administered 2022-08-20: 10 mg via INTRAMUSCULAR
  Filled 2022-08-20: qty 1

## 2022-08-20 MED ORDER — DIPHENHYDRAMINE HCL 25 MG PO CAPS: 25.0000 mg | ORAL_CAPSULE | Freq: Once | ORAL | Status: AC

## 2022-08-20 MED ORDER — SODIUM CHLORIDE 0.9 % IV SOLN
1.0000 g | Freq: Once | INTRAVENOUS | Status: DC
  Filled 2022-08-20: qty 10

## 2022-08-20 NOTE — Discharge Instructions (Addendum)
AMBULATORY SURGERY  DISCHARGE INSTRUCTIONS   The drugs that you were given will stay in your system until tomorrow so for the next 24 hours you should not:  Drive an automobile Make any legal decisions Drink any alcoholic beverage   You may resume regular meals tomorrow.  Today it is better to start with liquids and gradually work up to solid foods.  You may eat anything you prefer, but it is better to start with liquids, then soup and crackers, and gradually work up to solid foods.   Please notify your doctor immediately if you have any unusual bleeding, trouble breathing, redness and pain at the surgery site, drainage, fever, or pain not relieved by medication.    Additional Instructions:   Please contact your physician with any problems or Same Day Surgery at [phone], Monday through Friday 6 am to 4 pm, or [place] at [facility] number at [phone].   Lithotripsy, Care After This sheet gives you information about how to care for yourself after your procedure. Your health care provider may also give you more specific instructions. If you have problems or questions, contact your health care provider. What can I expect after the procedure? After the procedure, it is common to have: Some blood in your urine. This should only last for a few days. Soreness in your back, sides, or upper abdomen for a few days. Blotches or bruises on the area where the shock wave entered the skin. Pain, discomfort, or nausea when pieces (fragments) of the kidney stone move through the tube that carries urine from the kidney to the bladder (ureter). Stone fragments may pass soon after the procedure, but they may continue to pass for up to 4-8 weeks. If you have severe pain or nausea, contact your health care provider. This may be caused by a large stone that was not broken up, and this may mean that you need more treatment. Some pain or discomfort during urination. Some pain or discomfort  in the lower abdomen or (in men) at the base of the penis. Follow these instructions at home: Medicines Take over-the-counter and prescription medicines only as told by your health care provider. If you were prescribed an antibiotic medicine, take it as told by your health care provider. Do not stop taking the antibiotic even if you start to feel better. Ask your health care provider if the medicine prescribed to you requires you to avoid driving or using machinery. Eating and drinking A comparison of three sample cups showing dark yellow, yellow, and pale yellow urine.     A plate with examples of foods in a healthy diet.   Drink enough fluid to keep your urine pale yellow. This helps any remaining pieces of the stone to pass. It can also help prevent new stones from forming. Eat plenty of fresh fruits and vegetables. Follow instructions from your health care provider about eating or drinking restrictions. You may be instructed to: Reduce how much salt (sodium) you eat or drink. Check ingredients and nutrition facts on packaged foods and beverages to see how much sodium they contain. Reduce how much meat you eat. Eat the recommended amount of calcium for your age and gender. Ask your health care provider how much calcium you should have. General instructions Get plenty of rest. Return to your normal activities as told by your health care provider. Ask your health care provider what activities are safe for you. Most people can resume normal activities 1-2 days after the procedure.  If you were given a sedative during the procedure, it can affect you for several hours. Do not drive or operate machinery until your health care provider says that it is safe. Your health care provider may direct you to lie in a certain position (postural drainage) and tap firmly (percuss) over your kidney area to help stone fragments pass. Follow instructions as told by your health care provider. If directed,  strain all urine through the strainer that was provided by your health care provider. Keep all fragments for your health care provider to see. Any stones that are found may be sent to a medical lab for examination. The stone may be as small as a grain of salt. Keep all follow-up visits as told by your health care provider. This is important. Contact a health care provider if: You have a fever or chills. You have nausea that is severe or does not go away. You have any of these urinary symptoms: Blood in your urine for longer than your health care provider told you to expect. Urine that smells bad or unusual. Feeling a strong urge to urinate after emptying your bladder. Pain or burning with urination that does not go away. Urinating more often than usual and this does not go away. You have a stent and it comes out. Get help right away if: You have severe pain in your back, sides, or upper abdomen. You have any of these urinary symptoms: Severe pain while urinating. More blood in your urine or having blood in your urine when you did not before. Passing blood clots in your urine. Passing only a small amount of urine or being unable to pass any urine at all. You have severe nausea that leads to persistent vomiting. You faint. Summary After this procedure, it is common to have some pain, discomfort, or nausea when pieces (fragments) of the kidney stone move through the tube that carries urine from the kidney to the bladder (ureter). If this pain or nausea is severe, however, you should contact your health care provider. Return to your normal activities as told by your health care provider. Ask your health care provider what activities are safe for you. Drink enough fluid to keep your urine pale yellow. This helps any remaining pieces of the stone to pass, and it can help prevent new stones from forming. If directed, strain your urine and keep all fragments for your health care provider to see.  Fragments or stones may be as small as a grain of salt. Get help right away if you have severe pain in your back, sides, or upper abdomen, or if you have severe pain while urinating. This information is not intended to replace advice given to you by your health care provider. Make sure you discuss any questions you have with your health care provider. Document Revised: 09/22/2021 Document Reviewed: 06/30/2021 Else[employer] Patient Education  [employer].

## 2022-10-08 ENCOUNTER — Other Ambulatory Visit: Payer: Self-pay | Admitting: Surgery

## 2022-10-12 ENCOUNTER — Other Ambulatory Visit: Payer: Self-pay | Admitting: Surgery

## 2022-10-15 ENCOUNTER — Encounter
Admission: RE | Admit: 2022-10-15 | Discharge: 2022-10-15 | Disposition: A | Discharge status: Home / Self Care | Payer: Managed Care, Other (non HMO) | Source: Ambulatory Visit | Attending: Surgery | Admitting: Surgery

## 2022-10-15 VITALS — Ht 69.0 in | Wt 184.0 lb

## 2022-10-15 DIAGNOSIS — Z01812 Encounter for preprocedural laboratory examination: Secondary | ICD-10-CM

## 2022-10-15 NOTE — Patient Instructions (Addendum)
Your procedure is scheduled on: Thursday, December 14 Report to the Registration Desk on the 1st floor of the [facility]. To find out your arrival time, please call [phone] between 1PM - 3PM on: Wednesday, December 13 If your arrival time is 6:00 am, do not arrive prior to that time as the [facility] entrance doors do not open until 6:00 am.  REMEMBER: Instructions that are not followed completely may result in serious medical risk, up to and including death; or upon the discretion of your surgeon and anesthesiologist your surgery may need to be rescheduled.  Do not eat food after midnight the night before surgery.  No gum chewing, lozengers or hard candies.  You may however, drink water up to 2 hours before you are scheduled to arrive for your surgery. Do not drink anything within 2 hours of your scheduled arrival time.  In addition, your doctor has ordered for you to drink the provided  Gatorade G2 Drinking this carbohydrate drink up to two hours before surgery helps to reduce insulin resistance and improve patient outcomes. Please complete drinking 2 hours prior to scheduled arrival time.  TAKE THESE MEDICATIONS THE MORNING OF SURGERY WITH A SIP OF WATER:  Atorvastatin Bupropion Ezetimibe (zetia) Fenofibrate Gabapentin Pantoprazole Tamsulosin  Use albuterol inhaler on the day of surgery and bring to the hospital.  Metformin- hold for 2 days prior to surgery. Last day to take is Monday, December 11. Resume AFTER surgery.  One week prior to surgery: starting December 7 Stop meloxicam and Anti-inflammatories (NSAIDS) such as Advil, Aleve, Ibuprofen, Motrin, Naproxen, Naprosyn and Aspirin based products such as Excedrin, Goodys Powder, BC Powder. Stop ANY and ALL OVER THE COUNTER supplements until after surgery.  You may however, continue to take Tylenol if needed for pain up until the day of surgery.  No Alcohol for 24 hours before or after surgery.  No Smoking  including e-cigarettes for 24 hours prior to surgery.  No chewable tobacco products for at least 6 hours prior to surgery.  No nicotine patches on the day of surgery.  Do not use any "recreational" drugs for at least a week prior to your surgery.  Please be advised that the combination of cocaine and anesthesia may have negative outcomes, up to and including death. If you test positive for cocaine, your surgery will be cancelled.  On the morning of surgery brush your teeth with toothpaste and water, you may rinse your mouth with mouthwash if you wish. Do not swallow any toothpaste or mouthwash.  Use CHG Soap as directed on instruction sheet.  Do not wear jewelry, make-up, hairpins, clips or nail polish.  Do not wear lotions, powders, or perfumes.   Do not shave body from the neck down 48 hours prior to surgery just in case you cut yourself which could leave a site for infection.  Also, freshly shaved skin may become irritated if using the CHG soap.  Contact lenses, hearing aids and dentures may not be worn into surgery.  Do not bring valuables to the hospital. [facility] is not responsible for any missing/lost belongings or valuables.   Bring your C-PAP to the hospital with you in case you may have to spend the night.   Notify your doctor if there is any change in your medical condition (cold, fever, infection).  Wear comfortable clothing (specific to your surgery type) to the hospital.  After surgery, you can help prevent lung complications by doing breathing exercises.  Take deep breaths  and cough every 1-2 hours. Your doctor may order a device called an Incentive Spirometer to help you take deep breaths.  If you are being discharged the day of surgery, you will not be allowed to drive home. You will need a responsible adult (18 years or older) to drive you home and stay with you that night.   If you are taking public transportation, you will need to have a responsible adult  (18 years or older) with you. Please confirm with your physician that it is acceptable to use public transportation.   Please call the [facility] at [phone] if you have any questions about these instructions.  Surgery Visitation Policy:  Patients undergoing a surgery or procedure may have two family members or support persons with them as long as the person is not COVID-19 positive or experiencing its symptoms.      Preparing for Surgery with CHLORHEXIDINE GLUCONATE (CHG) Soap  Chlorhexidine Gluconate (CHG) Soap  o An antiseptic cleaner that kills germs and bonds with the skin to continue killing germs even after washing  o Used for showering the night before surgery and morning of surgery  Before surgery, you can play an important role by reducing the number of germs on your skin.  CHG (Chlorhexidine gluconate) soap is an antiseptic cleanser which kills germs and bonds with the skin to continue killing germs even after washing.  Please do not use if you have an allergy to CHG or antibacterial soaps. If your skin becomes reddened/irritated stop using the CHG.  1. Shower the NIGHT BEFORE SURGERY and the MORNING OF SURGERY with CHG soap.  2. If you choose to wash your hair, wash your hair first as usual with your normal shampoo.  3. After shampooing, rinse your hair and body thoroughly to remove the shampoo.  4. Use CHG as you would any other liquid soap. You can apply CHG directly to the skin and wash gently with a scrungie or a clean washcloth.  5. Apply the CHG soap to your body only from the neck down. Do not use on open wounds or open sores. Avoid contact with your eyes, ears, mouth, and genitals (private parts). Wash face and genitals (private parts) with your normal soap.  6. Wash thoroughly, paying special attention to the area where your surgery will be performed.  7. Thoroughly rinse your body with warm water.  8. Do not shower/wash with your  normal soap after using and rinsing off the CHG soap.  9. Pat yourself dry with a clean towel.  10. Wear clean pajamas to bed the night before surgery.  12. Place clean sheets on your bed the night of your first shower and do not sleep with pets.  13. Shower again with the CHG soap on the day of surgery prior to arriving at the hospital.  14. Do not apply any deodorants/lotions/powders.  15. Please wear clean clothes to the hospital.

## 2022-10-22 ENCOUNTER — Other Ambulatory Visit: Payer: Self-pay

## 2022-10-22 ENCOUNTER — Ambulatory Visit: Payer: Managed Care, Other (non HMO) | Admitting: General Practice

## 2022-10-22 ENCOUNTER — Encounter: Payer: Self-pay | Admitting: Surgery

## 2022-10-22 ENCOUNTER — Ambulatory Visit
Admission: RE | Admit: 2022-10-22 | Discharge: 2022-10-22 | Disposition: A | Discharge status: Home / Self Care | Payer: Managed Care, Other (non HMO) | Attending: Surgery | Admitting: Surgery

## 2022-10-22 ENCOUNTER — Encounter
Admission: RE | Disposition: A | Discharge status: Home / Self Care | Payer: Self-pay | Source: Home / Self Care | Attending: Surgery

## 2022-10-22 DIAGNOSIS — I129 Hypertensive chronic kidney disease with stage 1 through stage 4 chronic kidney disease, or unspecified chronic kidney disease: Secondary | ICD-10-CM | POA: Insufficient documentation

## 2022-10-22 DIAGNOSIS — I2510 Atherosclerotic heart disease of native coronary artery without angina pectoris: Secondary | ICD-10-CM | POA: Insufficient documentation

## 2022-10-22 DIAGNOSIS — Z791 Long term (current) use of non-steroidal anti-inflammatories (NSAID): Secondary | ICD-10-CM | POA: Diagnosis not present

## 2022-10-22 DIAGNOSIS — Z01812 Encounter for preprocedural laboratory examination: Secondary | ICD-10-CM

## 2022-10-22 DIAGNOSIS — K219 Gastro-esophageal reflux disease without esophagitis: Secondary | ICD-10-CM | POA: Diagnosis not present

## 2022-10-22 DIAGNOSIS — N189 Chronic kidney disease, unspecified: Secondary | ICD-10-CM | POA: Insufficient documentation

## 2022-10-22 DIAGNOSIS — M6588 Other synovitis and tenosynovitis, other site: Secondary | ICD-10-CM | POA: Insufficient documentation

## 2022-10-22 DIAGNOSIS — J45909 Unspecified asthma, uncomplicated: Secondary | ICD-10-CM | POA: Diagnosis not present

## 2022-10-22 DIAGNOSIS — G4730 Sleep apnea, unspecified: Secondary | ICD-10-CM | POA: Insufficient documentation

## 2022-10-22 DIAGNOSIS — I10 Essential (primary) hypertension: Secondary | ICD-10-CM | POA: Diagnosis not present

## 2022-10-22 DIAGNOSIS — Z7984 Long term (current) use of oral hypoglycemic drugs: Secondary | ICD-10-CM | POA: Insufficient documentation

## 2022-10-22 DIAGNOSIS — E1122 Type 2 diabetes mellitus with diabetic chronic kidney disease: Secondary | ICD-10-CM | POA: Diagnosis not present

## 2022-10-22 HISTORY — PX: REPAIR EXTENSOR TENDON: SHX5382

## 2022-10-22 LAB — GLUCOSE, CAPILLARY
Glucose-Capillary: 122 mg/dL — ABNORMAL HIGH (ref 70–99)
Glucose-Capillary: 120 mg/dL — ABNORMAL HIGH (ref 70–99)

## 2022-10-22 SURGERY — REPAIR, TENDON, EXTENSOR
Anesthesia: General | Site: Wrist | Laterality: Right

## 2022-10-22 MED ORDER — BUPIVACAINE HCL (PF) 0.5 % IJ SOLN
INTRAMUSCULAR | Status: DC | PRN
  Administered 2022-10-22: 10 mL

## 2022-10-22 MED ORDER — CEFAZOLIN SODIUM-DEXTROSE 2-4 GM/100ML-% IV SOLN
INTRAVENOUS | Status: AC
  Filled 2022-10-22: qty 100

## 2022-10-22 MED ORDER — SODIUM CHLORIDE 0.9 % IV SOLN: INTRAVENOUS | Status: DC

## 2022-10-22 MED ORDER — PROPOFOL 10 MG/ML IV BOLUS
INTRAVENOUS | Status: DC | PRN
  Administered 2022-10-22: 140 mg via INTRAVENOUS

## 2022-10-22 MED ORDER — BUPIVACAINE HCL (PF) 0.5 % IJ SOLN
INTRAMUSCULAR | Status: AC
  Filled 2022-10-22: qty 30

## 2022-10-22 MED ORDER — ONDANSETRON HCL 4 MG/2ML IJ SOLN
INTRAMUSCULAR | Status: DC | PRN
  Administered 2022-10-22: 4 mg via INTRAVENOUS

## 2022-10-22 MED ORDER — LIDOCAINE HCL (PF) 2 % IJ SOLN
INTRAMUSCULAR | Status: AC
  Filled 2022-10-22: qty 5

## 2022-10-22 MED ORDER — MIDAZOLAM HCL 2 MG/2ML IJ SOLN
INTRAMUSCULAR | Status: AC
  Filled 2022-10-22: qty 2

## 2022-10-22 MED ORDER — DEXAMETHASONE SODIUM PHOSPHATE 10 MG/ML IJ SOLN
INTRAMUSCULAR | Status: AC
  Filled 2022-10-22: qty 1

## 2022-10-22 MED ORDER — SEVOFLURANE IN SOLN
RESPIRATORY_TRACT | Status: AC
  Filled 2022-10-22: qty 250

## 2022-10-22 MED ORDER — CHLORHEXIDINE GLUCONATE 0.12 % MT SOLN
OROMUCOSAL | Status: AC
  Administered 2022-10-22: 15 mL via OROMUCOSAL
  Filled 2022-10-22: qty 15

## 2022-10-22 MED ORDER — OXYCODONE HCL 5 MG PO TABS: 5.0000 mg | ORAL_TABLET | Freq: Once | ORAL | Status: DC | PRN

## 2022-10-22 MED ORDER — OXYCODONE HCL 5 MG/5ML PO SOLN: 5.0000 mg | Freq: Once | ORAL | Status: DC | PRN

## 2022-10-22 MED ORDER — DEXAMETHASONE SODIUM PHOSPHATE 10 MG/ML IJ SOLN
INTRAMUSCULAR | Status: DC | PRN
  Administered 2022-10-22: 10 mg via INTRAVENOUS

## 2022-10-22 MED ORDER — FENTANYL CITRATE (PF) 100 MCG/2ML IJ SOLN
INTRAMUSCULAR | Status: DC | PRN
  Administered 2022-10-22 (×2): 50 ug via INTRAVENOUS

## 2022-10-22 MED ORDER — HYDROCODONE-ACETAMINOPHEN 5-325 MG PO TABS: 1.0000 | ORAL_TABLET | Freq: Four times a day (QID) | ORAL | 0 refills | Status: AC | PRN

## 2022-10-22 MED ORDER — HYDROCODONE-ACETAMINOPHEN 5-325 MG PO TABS
1.0000 | ORAL_TABLET | ORAL | Status: DC | PRN
  Administered 2022-10-22: 2 via ORAL

## 2022-10-22 MED ORDER — CEFAZOLIN SODIUM-DEXTROSE 2-4 GM/100ML-% IV SOLN
2.0000 g | INTRAVENOUS | Status: AC
  Administered 2022-10-22: 2 g via INTRAVENOUS

## 2022-10-22 MED ORDER — 0.9 % SODIUM CHLORIDE (POUR BTL) OPTIME
TOPICAL | Status: DC | PRN
  Administered 2022-10-22: 500 mL

## 2022-10-22 MED ORDER — KETOROLAC TROMETHAMINE 15 MG/ML IJ SOLN: 15.0000 mg | Freq: Once | INTRAMUSCULAR | Status: AC

## 2022-10-22 MED ORDER — FENTANYL CITRATE (PF) 100 MCG/2ML IJ SOLN: 25.0000 ug | INTRAMUSCULAR | Status: DC | PRN

## 2022-10-22 MED ORDER — KETOROLAC TROMETHAMINE 15 MG/ML IJ SOLN
INTRAMUSCULAR | Status: AC
  Administered 2022-10-22: 15 mg via INTRAVENOUS
  Filled 2022-10-22: qty 1

## 2022-10-22 MED ORDER — FENTANYL CITRATE (PF) 100 MCG/2ML IJ SOLN
INTRAMUSCULAR | Status: AC
  Filled 2022-10-22: qty 2

## 2022-10-22 MED ORDER — HYDROCODONE-ACETAMINOPHEN 5-325 MG PO TABS
ORAL_TABLET | ORAL | Status: AC
  Filled 2022-10-22: qty 2

## 2022-10-22 MED ORDER — FENTANYL CITRATE (PF) 100 MCG/2ML IJ SOLN
INTRAMUSCULAR | Status: AC
  Administered 2022-10-22: 50 ug via INTRAVENOUS
  Filled 2022-10-22: qty 2

## 2022-10-22 MED ORDER — ONDANSETRON HCL 4 MG/2ML IJ SOLN
INTRAMUSCULAR | Status: AC
  Filled 2022-10-22: qty 2

## 2022-10-22 MED ORDER — CHLORHEXIDINE GLUCONATE 0.12 % MT SOLN: 15.0000 mL | Freq: Once | OROMUCOSAL | Status: AC

## 2022-10-22 MED ORDER — MIDAZOLAM HCL 2 MG/2ML IJ SOLN
INTRAMUSCULAR | Status: DC | PRN
  Administered 2022-10-22: 2 mg via INTRAVENOUS

## 2022-10-22 MED ORDER — LIDOCAINE HCL (CARDIAC) PF 100 MG/5ML IV SOSY
PREFILLED_SYRINGE | INTRAVENOUS | Status: DC | PRN
  Administered 2022-10-22: 80 mg via INTRAVENOUS

## 2022-10-22 MED ORDER — ORAL CARE MOUTH RINSE: 15.0000 mL | Freq: Once | OROMUCOSAL | Status: AC

## 2022-10-22 SURGICAL SUPPLY — 40 items
BENZOIN TINCTURE PRP APPL 2/3 (GAUZE/BANDAGES/DRESSINGS) IMPLANT
BNDG ELASTIC 4X5.8 VLCR NS LF (GAUZE/BANDAGES/DRESSINGS) ×1 IMPLANT
BNDG ESMARK 4X12 TAN STRL LF (GAUZE/BANDAGES/DRESSINGS) ×1 IMPLANT
CHLORAPREP W/TINT 26 (MISCELLANEOUS) ×1 IMPLANT
CORD BIP STRL DISP 12FT (MISCELLANEOUS) ×1 IMPLANT
ELECT CAUTERY BLADE 6.4 (BLADE) ×1 IMPLANT
ELECT REM PT RETURN 9FT ADLT (ELECTROSURGICAL) ×1
ELECTRODE REM PT RTRN 9FT ADLT (ELECTROSURGICAL) ×1 IMPLANT
FORCEPS JEWEL BIP 4-3/4 STR (INSTRUMENTS) ×1 IMPLANT
GAUZE SPONGE 4X4 12PLY STRL (GAUZE/BANDAGES/DRESSINGS) ×1 IMPLANT
GAUZE XEROFORM 1X8 LF (GAUZE/BANDAGES/DRESSINGS) ×1 IMPLANT
GLOVE BIO SURGEON STRL SZ8 (GLOVE) ×2 IMPLANT
GLOVE SURG UNDER LTX SZ8 (GLOVE) ×1 IMPLANT
GOWN STRL REUS W/ TWL LRG LVL3 (GOWN DISPOSABLE) ×1 IMPLANT
GOWN STRL REUS W/ TWL XL LVL3 (GOWN DISPOSABLE) ×1 IMPLANT
GOWN STRL REUS W/TWL LRG LVL3 (GOWN DISPOSABLE) ×1
GOWN STRL REUS W/TWL XL LVL3 (GOWN DISPOSABLE) ×1
KIT TURNOVER KIT A (KITS) ×1 IMPLANT
MANIFOLD NEPTUNE II (INSTRUMENTS) ×1 IMPLANT
NDL HYPO 25X1 1.5 SAFETY (NEEDLE) ×1 IMPLANT
NEEDLE HYPO 25X1 1.5 SAFETY (NEEDLE) ×1 IMPLANT
NS IRRIG 500ML POUR BTL (IV SOLUTION) ×1 IMPLANT
PACK EXTREMITY ARMC (MISCELLANEOUS) ×1 IMPLANT
PAD CAST 4YDX4 CTTN HI CHSV (CAST SUPPLIES) ×1 IMPLANT
PADDING CAST COTTON 4X4 STRL (CAST SUPPLIES) ×1
SPLINT CAST 1 STEP 4X15 (MISCELLANEOUS) ×1 IMPLANT
SPLINT WRIST LG RT TX900304 (SOFTGOODS) IMPLANT
STOCKINETTE 48X4 2 PLY STRL (GAUZE/BANDAGES/DRESSINGS) ×1 IMPLANT
STOCKINETTE STRL 4IN 9604848 (GAUZE/BANDAGES/DRESSINGS) ×1 IMPLANT
STRIP CLOSURE SKIN 1/2X4 (GAUZE/BANDAGES/DRESSINGS) IMPLANT
SUT ETHILON 4 0 P 3 18 (SUTURE) ×1 IMPLANT
SUT ETHILON 5 0 P 3 18 (SUTURE) ×1
SUT NYLON ETHILON 5-0 P-3 1X18 (SUTURE) ×1 IMPLANT
SUT PROLENE 4 0 PS 2 18 (SUTURE) ×1 IMPLANT
SUT VIC AB 2-0 CT2 27 (SUTURE) IMPLANT
SUT VIC AB 3-0 SH 27 (SUTURE) ×2
SUT VIC AB 3-0 SH 27X BRD (SUTURE) IMPLANT
SYR 10ML LL (SYRINGE) ×1 IMPLANT
TRAP FLUID SMOKE EVACUATOR (MISCELLANEOUS) ×1 IMPLANT
WATER STERILE IRR 500ML POUR (IV SOLUTION) ×1 IMPLANT

## 2022-10-22 NOTE — Discharge Instructions (Addendum)
Orthopedic discharge instructions: Keep dressing/splint dry and intact. Keep hand elevated above heart level. May shower after dressing removed on postop day 4 (Monday). Cover sutures with Band-Aids after drying off, then reapply Velcro splint. Apply ice to affected area frequently. Take ibuprofen 600-800 mg TID with meals for 7-10 days, then as necessary. Take ES Tylenol or pain medication as prescribed when needed.  Return for follow-up in 10-14 days or as scheduled.AMBULATORY SURGERY  DISCHARGE INSTRUCTIONS   The drugs that you were given will stay in your system until tomorrow so for the next 24 hours you should not:  Drive an automobile Make any legal decisions Drink any alcoholic beverage   You may resume regular meals tomorrow.  Today it is better to start with liquids and gradually work up to solid foods.  You may eat anything you prefer, but it is better to start with liquids, then soup and crackers, and gradually work up to solid foods.   Please notify your doctor immediately if you have any unusual bleeding, trouble breathing, redness and pain at the surgery site, drainage, fever, or pain not relieved by medication.    Your post-operative visit with Dr.                                       is: Date:                        Time:    Please call to schedule your post-operative visit.  Additional Instructions:

## 2022-10-22 NOTE — Op Note (Signed)
10/22/2022  5:45 PM  Patient:   Jesse Alvarado  Pre-Op Diagnosis:   Extensor tenosynovitis, right wrist.  Post-Op Diagnosis:   Extensor tenosynovitis, right wrist.  Procedure:   Extensive tenosynovectomy, dorsal right wrist.  Surgeon:   Pascal Lux, MD  Assistant:   None  Anesthesia:   General LMA  Findings:   As above.  Complications:   None  Fluids:   800 cc crystalloid  EBL:   1 cc  UOP:   None  TT:   67 minutes at 250 mmHg  Drains:   None  Closure:   3-0 Vicryl subcuticular sutures  Brief Clinical Note:   The patient is a 56 year old male who is now 10 weeks status post a debridement of extensor tenosynovium involving the extensor digitorum communis tendon of the right wrist.  At the patient's 6-week follow-up, he was noted to have recurrent swelling and fluid collection over the dorsal aspect of the wrist.  The patient was not happy with this and so presents for a more extensive extensor tenosynovectomy of the right wrist.  Procedure:   The patient was brought into the operating room and laid in the supine position.  After adequate general laryngeal mask anesthesia was obtained, the patient's right hand and upper extremity were prepped with ChloraPrep solution before being draped sterilely.  Preoperative antibiotics were administered.  A timeout was performed to verify the appropriate surgical site before the limb was exsanguinated with an Esmarch and the tourniquet inflated to 250 mmHg.  An approximately 7 to 8 cm incision was made longitudinally over the dorsal aspect of the wrist.  The incision was carried down through the subcutaneous tissues to expose the dorsal retinaculum at the more proximal end of the incision.  Abundant thickened tenosynovium was encountered and involved the second, third, fourth, and fifth dorsal compartments.  The dorsal retinaculum was opened in a Z-shaped configuration with the more distal half of the retinaculum left intact radially  and the ulnar proximal half of the retinaculum left intact ulnarly.    An extensive tenosynovectomy was performed sequentially beginning with the second dorsal compartment and extending over to the fifth dorsal compartment.  The extensor pollicis longus was released from its compartment and repositioned radial to Lister's tubercle.  This tissue was sent down to pathology to look for evidence of rheumatoid or other inflammatory process.    After the tenosynovectomy was completed, the wound was copiously irrigated with sterile saline solution using bulb irrigation.  The radially based distal half of the extensor retinaculum was passed deep to the extensor tendons and secured to the retinacular tissue at the ulnar border of the fifth dorsal compartment while the ulnarly based proximal half of the extensor retinaculum was passed over the extensor tendons and secured to the radial margin of the fourth dorsal compartment using 2-0 Vicryl interrupted sutures.  The subcutaneous tissues were closed using 3-0 Vicryl interrupted sutures before the skin was closed using 3-0 Vicryl subcuticular sutures.  Benzoin and Steri-Strips were applied to the skin before a total of 10 cc of 0.5% plain Sensorcaine was injected in and around the incision to help with postoperative analgesia.  A sterile bulky dressing was applied to the wound before the patient was placed into a volar splint maintaining the wrist in neutral position.  The patient was then awakened, extubated, and returned to the recovery room in satisfactory condition after tolerating the procedure well.

## 2022-10-22 NOTE — Anesthesia Postprocedure Evaluation (Signed)
Anesthesia Post Note  Patient: Jesse Alvarado  Procedure(s) Performed: EXTENSOR TENOSYNOVECTOMY OF RIGHT WRIST (Right: Wrist)  Patient location during evaluation: PACU Anesthesia Type: General Level of consciousness: awake and alert Pain management: pain level controlled Vital Signs Assessment: post-procedure vital signs reviewed and stable Respiratory status: spontaneous breathing, nonlabored ventilation, respiratory function stable and patient connected to nasal cannula oxygen Cardiovascular status: blood pressure returned to baseline and stable Postop Assessment: no apparent nausea or vomiting Anesthetic complications: no   No notable events documented.   Last Vitals:  Vitals:   10/22/22 1800 10/22/22 1830  BP: (!) 132/91 131/78  Pulse: 75 72  Resp: 11 16  Temp:  36.4 C  SpO2: 98% 99%    Last Pain:  Vitals:   10/22/22 1830  TempSrc: Temporal  PainSc: 3                  Arita Miss

## 2022-10-22 NOTE — Anesthesia Preprocedure Evaluation (Signed)
Anesthesia Evaluation  Patient identified by MRN, date of birth, ID band Patient awake    Reviewed: Allergy & Precautions, NPO status , Patient's Chart, lab work & pertinent test results  History of Anesthesia Complications Negative for: history of anesthetic complications  Airway Mallampati: III  TM Distance: <3 FB Neck ROM: full    Dental  (+) Poor Dentition, Dental Advidsory Given   Pulmonary asthma , sleep apnea and Continuous Positive Airway Pressure Ventilation    Pulmonary exam normal        Cardiovascular Exercise Tolerance: Good hypertension, (-) angina + CAD  (-) Past MI Normal cardiovascular exam     Neuro/Psych  Neuromuscular disease  negative psych ROS   GI/Hepatic Neg liver ROS,GERD  Controlled,,  Endo/Other  diabetes, Type 2    Renal/GU Renal disease  negative genitourinary   Musculoskeletal   Abdominal   Peds  Hematology negative hematology ROS (+)   Anesthesia Other Findings Past Medical History: No date: Anemia No date: Arthritis No date: Asthma     Comment:  sports induced asthma. takes inhalers when needed No date: Barrett's esophagus without dysplasia No date: Cancer [facility])     Comment:  Basal and squamoous cell No date: Chronic kidney disease No date: Complication of anesthesia     Comment:  high tolerance to pain medication. body absorbs pain med              quickly No date: Coronary artery disease No date: Cough on exercise No date: Diabetes mellitus without complication (HCC) No date: Diverticulosis No date: Erectile dysfunction No date: Fatty liver No date: GERD (gastroesophageal reflux disease) No date: History of kidney stones No date: Hyperlipidemia No date: Hypertension     Comment:  per patient, he does not have high bp but is treated for              his kidneys and his diabetes No date: Pancreatitis No date: Right shoulder injury No date: Sleep apnea     Comment:   use C-PAP  Past Surgical History: No date: ANKLE GANGLION CYST EXCISION; Left     Comment:  x 5 10/03/2018: CARPAL TUNNEL RELEASE; Right     Comment:  Procedure: CARPAL TUNNEL RELEASE;  Surgeon: Earnestine Leys, MD;  Location: [facility] ORS;  Service: Orthopedics;                Laterality: Right; 10/24/2018: CARPAL TUNNEL RELEASE; Left     Comment:  Procedure: CARPAL TUNNEL RELEASE;  Surgeon: Earnestine Leys, MD;  Location: [facility];  Service: Orthopedics;                Laterality: Left; No date: COLONOSCOPY 08/30/2019: COLONOSCOPY WITH PROPOFOL; N/A     Comment:  Procedure: COLONOSCOPY WITH PROPOFOL;  Surgeon: [place],               Benay Pike, MD;  Location: [facility];  Service:               Gastroenterology;  Laterality: N/A; 06/05/2016: CYSTOSCOPY WITH STENT PLACEMENT; Bilateral     Comment:  Procedure: CYSTOSCOPY WITH STENT PLACEMENT;  Surgeon:               Nickie Retort, MD;  Location: [facility];  Service:  Urology;  Laterality: Bilateral; 04/13/2016: CYSTOSCOPY/URETEROSCOPY/HOLMIUM LASER/STENT PLACEMENT;  Left     Comment:  Procedure: CYSTOSCOPY/RETROGRADE PYELOGRAM/URETEROSCOPY               WITH HOLMIUM LASER LITHOTRIPSY//STENT PLACEMENT;                Surgeon: Festus Aloe, MD;  Location: [facility];                Service: Urology;  Laterality: Left; 12/27/2015: ESOPHAGOGASTRODUODENOSCOPY (EGD) WITH PROPOFOL; N/A     Comment:  Procedure: ESOPHAGOGASTRODUODENOSCOPY (EGD) WITH               PROPOFOL;  Surgeon: Manya Silvas, MD;  Location: [facility]              ENDOSCOPY;  Service: Endoscopy;  Laterality: N/A; 08/30/2019: ESOPHAGOGASTRODUODENOSCOPY (EGD) WITH PROPOFOL; N/A     Comment:  Procedure: ESOPHAGOGASTRODUODENOSCOPY (EGD) WITH               PROPOFOL;  Surgeon: Toledo, Benay Pike, MD;  Location:               [facility] ENDOS[facility];  Service: Gastroenterology;  Laterality:               N/A; 12/30/2017: EXTRACORPOREAL SHOCK  WAVE LITHOTRIPSY; Right     Comment:  Procedure: EXTRACORPOREAL SHOCK WAVE LITHOTRIPSY (ESWL);              Surgeon: Royston Cowper, MD;  Location: [facility];                Service: Urology;  Laterality: Right; 03/19/2022: EXTRACORPOREAL SHOCK WAVE LITHOTRIPSY; Left     Comment:  Procedure: EXTRACORPOREAL SHOCK WAVE LITHOTRIPSY (ESWL);              Surgeon: Royston Cowper, MD;  Location: [facility];                Service: Urology;  Laterality: Left; 2007: HERNIA REPAIR     Comment:  umbilical 8127: KNEE ARTHROSCOPY; Right     Comment:  had bursa sack repaired 10/11/2020: KNEE ARTHROSCOPY WITH MEDIAL MENISECTOMY; Right     Comment:  Procedure: Right knee arthroscopy partial medial               meniscectomy;  Surgeon: Leim Fabry, MD;  Location:               [place];  Service: Orthopedics;  Laterality:               Right;  Diabetic - oral meds sleep apnea 11/18/2018: KNEE ARTHROSCOPY WITH MENISCAL REPAIR; Right     Comment:  Procedure: KNEE ARTHROSCOPY WITH MENISCAL REPAIR AND               CHONDROPLASTY;  Surgeon: Leim Fabry, MD;  Location:               [place];  Service: Orthopedics;  Laterality:               Right;  Diabetic - oral meds sleep apnea SUPINE WITH               ACL LEG HOLDER SMITH AND NEWPHEW [place] 2011: SHOULDER SURGERY; Right     Comment:  tendon was shredded and was trimmed, repaired and               reattached. Screws in shoulder 10/28/2021: TOTAL HIP ARTHROPLASTY; Left  Comment:  Procedure: TOTAL HIP ARTHROPLASTY;  Surgeon: Corky Mull, MD;  Location: [facility];  Service: Orthopedics;                Laterality: Left; 06/05/2016: URETEROSCOPY WITH HOLMIUM LASER LITHOTRIPSY; Bilateral     Comment:  Procedure: URETEROSCOPY WITH HOLMIUM LASER LITHOTRIPSY;               Surgeon: Nickie Retort, MD;  Location: [facility];                Service: Urology;  Laterality: Bilateral; 2002: VASECTOMY  BMI    Body  Mass Index: 26.20 kg/m      Reproductive/Obstetrics negative OB ROS                             Anesthesia Physical Anesthesia Plan  ASA: 3  Anesthesia Plan: General   Post-op Pain Management:    Induction: Intravenous  PONV Risk Score and Plan: 2 and Propofol infusion and TIVA  Airway Management Planned: LMA  Additional Equipment:   Intra-op Plan:   Post-operative Plan:   Informed Consent: I have reviewed the patients History and Physical, chart, labs and discussed the procedure including the risks, benefits and alternatives for the proposed anesthesia with the patient or authorized representative who has indicated his/her understanding and acceptance.     Dental Advisory Given  Plan Discussed with: Anesthesiologist, CRNA and Surgeon  Anesthesia Plan Comments: (Patient consented for risks of anesthesia including but not limited to:  - adverse reactions to medications - risk of airway placement if required - damage to eyes, teeth, lips or other oral mucosa - nerve damage due to positioning  - sore throat or hoarseness - Damage to heart, brain, nerves, lungs, other parts of body or loss of life  Patient voiced understanding.)        Anesthesia Quick Evaluation

## 2022-10-22 NOTE — Transfer of Care (Signed)
Immediate Anesthesia Transfer of Care Note  Patient: Jesse Alvarado  Procedure(s) Performed: EXTENSOR TENOSYNOVECTOMY OF RIGHT WRIST (Right: Wrist)  Patient Location: PACU  Anesthesia Type:General  Level of Consciousness: awake, alert , and oriented  Airway & Oxygen Therapy: Patient Spontanous Breathing and Patient connected to face mask oxygen  Post-op Assessment: Report given to RN, Post -op Vital signs reviewed and stable, and Patient moving all extremities  Post vital signs: Reviewed and stable  Last Vitals:  Vitals Value Taken Time  BP 126/77 10/22/22 1734  Temp    Pulse 77 10/22/22 1738  Resp 14 10/22/22 1738  SpO2 99 % 10/22/22 1738  Vitals shown include unvalidated device data.  Last Pain:  Vitals:   10/22/22 1432  TempSrc: Oral  PainSc: 4          Complications: No notable events documented.

## 2022-10-22 NOTE — Anesthesia Procedure Notes (Signed)
Procedure Name: LMA Insertion Date/Time: 10/22/2022 4:04 PM  Performed by: Esaw Grandchild, CRNAPre-anesthesia Checklist: Patient identified, Emergency Drugs available, Suction available and Patient being monitored Patient Re-evaluated:Patient Re-evaluated prior to induction Oxygen Delivery Method: Circle system utilized Preoxygenation: Pre-oxygenation with 100% oxygen Induction Type: IV induction LMA: LMA inserted LMA Size: 4.0 Number of attempts: 1 Placement Confirmation: positive ETCO2 and breath sounds checked- equal and bilateral Tube secured with: Tape Dental Injury: Teeth and Oropharynx as per pre-operative assessment

## 2022-10-22 NOTE — H&P (Signed)
History of Present Illness:  Jesse Alvarado is a 56 y.o. male who presents for follow-up now 6 weeks status post an extensor tendon tenosynovectomy with debridement of the extensor digitorum commonness tendon of the right wrist. His swelling recurred about 2 to 3 weeks postoperatively, so he was referred to [name] for an ultrasound-guided aspiration and steroid injection of the extensor digitorum communis tendon sheath. He notes that this injection provided temporary relief of his symptoms, but again is noticing increased swelling and discomfort over the dorsal aspect of his wrist. The patient rates his pain at 4/10 on today's visit. He is not taking any medications for discomfort at this time. His symptoms are aggravated by activities that require him to extend the wrist. He denies any reinjury to the wrist, and denies any fevers or chills. He continues to work without restrictions.  Current Outpatient Medications: atorvastatin (LIPITOR) 80 MG tablet Take 80 mg by mouth once  azelastine-fluticasone 137-50 mcg/spray [place] into one nostril 2 (two) times daily  benzonatate (TESSALON) 200 MG capsule Take 200 mg by mouth 3 (three) times daily as needed for Cough  bergamot extract (CITRUS BERGAMOT) 500 mg Cap Take by mouth  buPROPion (WELLBUTRIN XL) 150 MG XL tablet Take 1 tablet (150 mg total) by mouth once daily 30 tablet 2  calcium citrate-vitamin D3 (CITRACAL+D) 315-200 mg-unit tablet Take 1 tablet by mouth 3 (three) times daily with meals  ciclopirox (PENLAC) 8 % topical nail solution Apply topically nightly 6.6 mL 3  cranberry 500 mg Cap Take by mouth  cyanocobalamin (VITAMIN B12) 1,000 mcg/mL injection Inject 1 mL (1,000 mcg total) into the muscle monthly 1 mL 11  DEXCOM G7 SENSOR Devi USE ONE EACH EVERY 10 DAYS  DYMISTA 137-50 mcg/spray [place] into one nostril 2 (two) times daily  ezetimibe (ZETIA) 10 mg tablet Take by mouth  fenofibrate nanocrystallized (TRICOR) 145 MG tablet  TAKE ONE TABLET BY MOUTH ONE TIME DAILY 90 tablet 3  ferrous gluconate (FERGON) 324 MG tablet TAKE ONE TABLET BY MOUTH THREE TIMES A DAY WITH FOOD 010 tablet 1  folic acid (FOLVITE) 1 MG tablet TAKE ONE TABLET BY MOUTH ONE TIME DAILY 90 tablet 1  gabapentin (NEURONTIN) 300 MG capsule Take 300 mg by mouth  glimepiride (AMARYL) 2 MG tablet Take one tablet (2 mg total) in the morning, and two tablets (4 mg total) in the evening with meals. 270 tablet 4  ipratropium (ATROVENT) 0.06 % nasal spray Place 2 sprays into both nostrils 2 (two) times daily 3  levocetirizine (XYZAL) 5 MG tablet Take 5 mg by mouth every evening  losartan (COZAAR) 50 MG tablet Take 1 tablet (50 mg total) by mouth once daily 90 tablet 3  meloxicam (MOBIC) 15 MG tablet Take 1 tablet (15 mg total) by mouth once daily 90 tablet 1  metFORMIN (GLUCOPHAGE) 850 MG tablet TAKE ONE TABLET BY MOUTH THREE TIMES A DAY 270 tablet 3  pantoprazole (PROTONIX) 40 MG DR tablet Take 1 tablet (40 mg total) by mouth 2 (two) times daily 180 tablet 1  pioglitazone (ACTOS) 30 MG tablet TAKE ONE TABLET BY MOUTH ONE TIME DAILY 90 tablet 1  pravastatin (PRAVACHOL) 40 MG tablet Take 40 mg by mouth at bedtime  silodosin 4 mg Cap TAKE ONE CAPSULE BY MOUTH ONE TIME DAILY 30 capsule 6  SITagliptin phosphate (JANUVIA) 100 MG tablet Take 1 tablet (100 mg total) by mouth once daily 30 tablet 6  sucralfate (CARAFATE) 1 gram tablet TAKE  ONE TABLET BY MOUTH FOUR TIMES A DAY BEFORE MEALS AND AT NIGHT 360 tablet 1  syringe with needle (SYRINGE 3CC/25GX1") 3 mL 25 gauge x 1" Syrg Use 1 each monthly. Dx E53.8 100 Syringe 1  tadalafiL (CIALIS) 5 MG tablet Take 5 mg by mouth once daily  tiZANidine (ZANAFLEX) 2 MG tablet Take 1 tablet (2 mg total) by mouth 3 (three) times daily as needed May take two at at time if needed. 60 tablet 0  traMADoL (ULTRAM) 50 mg tablet Take 1 tablet (50 mg total) by mouth every 8 (eight) hours as needed for Pain 21 tablet 0  TURMERIC ORAL Take  750 mg by mouth (Patient not taking: Reported on 08/24/2022)  VASCEPA 1 gram Cap Take 2 g by mouth 2 (two) times daily   Allergies:  Ciprocinonide Other (GI upset)  Ciprofloxacin Other (GI upset)  Monosodium Glutamate Diarrhea  Trulicity [Dulaglutide] Other (Caused pancreatitis)   Past Medical History:  Allergy  Anemia  Arthritis  Barrett's esophagus without dysplasia 01/19/2019  Chronic kidney disease  Diabetes mellitus type 2, uncomplicated (CMS-HCC)  diagnosed 2005  Erectile dysfunction  FH: colon cancer 01/19/2019  FH: colon polyps 01/19/2019  GERD (gastroesophageal reflux disease)  Hyperlipidemia  Hypertension  Pancreatitis  Right shoulder injury 2010  with rupture of biceps tendon  Sleep apnea   Past Surgical History:  Right shoulder surgery for ruptured biceps tendon 2010  COLONOSCOPY 05/10/2014 ([place] (Brother), FH Colon Polyps (Father): CBF 05/2019) Excision, right prepatellar bursa and ostectomy, Osgood-Schlatter lesion. Right 10/02/2014 (Dr. Rudene Christians)  EGD 12/27/2015 (Barrett's Esophagus: CBF 12/2018)  KNEE ARTHROSCOPY Right 11/18/2018  RIGHT KNEE ARTHROSCOPY, MEDIAL MENISCUS REPAIR (HORIZONTAL TEAR OF POSTERIOR HORN), RIGHT KNEE CHONDROPLASTY OF PATELLA AND MEDIAL FEMORAL CONDYLE  COLONOSCOPY 08/30/2019 (Diverticulosis/FHx CC-Brother/Repeat 60yr/TKT)  EGD 08/30/2019 (Barrett's Esophagus/Repeat 359yrTKT)  Left total hip arthroplasty Left 10/28/2021 (Dr.Sidnee Gambrill)  Extensor tenosynovectomy with debridement of EDC tendon, right wrist. Right 08/12/2022 (Dr. [place] [facility] ([id number] UMBILICAL HERNIA REPAIR   Family History:  Cancer Brother  Colon cancer Brother  Lung cancer Brother  Skin cancer Father  No Known Problems Mother  Asthma Other  Diabetes type II Other   Social History:   Socioeconomic History:  Marital status: Married  Occupational History  Occupation: meCounsellorTobacco Use  Smoking status: Never  Smokeless  tobacco: Never  Vaping Use  Vaping Use: Never used  Substance and Sexual Activity  Alcohol use: Not Currently  Drug use: No  Sexual activity: Not Currently  Partners: Female   Social Determinants of Health: Financial Resource Strain: Low Risk (11/04/2021)  Overall Financial Resource Strain (CARDIA)  Difficulty of Paying Living Expenses: Not hard at all  Food Insecurity: No Food Insecurity (11/04/2021)  Hunger Vital Sign  Worried About Running Out of Food in the Last Year: Never true  Ran Out of Food in the Last Year: Never true  Transportation Needs: No Transportation Needs (11/04/2021)  PRAPARE - TrTransport plannerMedical): No  Lack of Transportation (Non-Medical): No   Review of Systems:  A comprehensive 14 point ROS was performed, reviewed, and the pertinent orthopaedic findings are documented in the HPI.  Physical Exam: Vitals:  09/25/22 1056  BP: 116/74  Weight: 83.6 kg (184 lb 3.2 oz)  Height: 175.3 cm ('5\' 9"'$ )  PainSc: 4  PainLoc: Wrist   General/Constitutional: The patient appears to be well-nourished, well-developed, and in no acute distress. Neuro/Psych: Normal mood and affect, oriented to person, place  and time. Eyes: Non-icteric. Pupils are equal, round, and reactive to light, and exhibit synchronous movement. ENT: Unremarkable. Lymphatic: No palpable adenopathy. Respiratory: Lungs clear to auscultation, Normal chest excursion, No wheezes, and Non-labored breathing Cardiovascular: Regular rate and rhythm. No murmurs. and No edema, swelling or tenderness, except as noted in detailed exam. Integumentary: No impressive skin lesions present, except as noted in detailed exam. Musculoskeletal: Unremarkable, except as noted in detailed exam.  Right wrist exam: Skin inspection of the right wrist demonstrates a surgical incision to be well-healed and without evidence for infection. There is recurrent focal swelling over the dorsal aspect of the  wrist measuring approximately 1 x 1.5 x 3 cm. There is only minimal tenderness to firm palpation over this mass. There is no erythema, ecchymosis, abrasions, or other skin abnormalities identified. Actively, is able to extend his wrist to 40 degrees and flex his wrist to 45 degrees with only mild discomfort in maximal extension. He is able to actively flex and extend all digits without any pain or triggering. He is neurovascular intact to the right hand and wrist.  Assessment: Extensor tenosynovitis of right wrist.  Ganglion cyst of wrist, right.   Plan: The treatment options were discussed with the patient. In addition, patient educational materials were provided regarding the diagnosis and treatment options. The patient is frustrated by his recurrent symptoms and function limitations, and is ready to consider more aggressive treatment options. Therefore, I have recommended a surgical procedure, specifically a more extensive tenosynovectomy of the extensor digitorum communis tendons. The procedure was discussed with the patient, as were the potential risks (including bleeding, infection, nerve and/or blood vessel injury, persistent or recurrent pain, recurrence of the swelling/fluid, stiffness of the wrist and/or fingers, need for further surgery, blood clots, strokes, heart attacks and/or arhythmias, pneumonia, etc.) and benefits. The patient states his understanding and wishes to proceed. All of the patient's questions and concerns were answered. He can call any time with further concerns. He will follow up post-surgery, routine. He may continue to work without restrictions until his surgery.    H&P reviewed and patient re-examined. No changes.

## 2022-10-23 ENCOUNTER — Encounter: Payer: Self-pay | Admitting: Surgery

## 2022-10-26 LAB — SURGICAL PATHOLOGY

## 2022-11-17 ENCOUNTER — Encounter: Payer: Self-pay | Admitting: Surgery

## 2022-11-23 ENCOUNTER — Other Ambulatory Visit (HOSPITAL_BASED_OUTPATIENT_CLINIC_OR_DEPARTMENT_OTHER): Payer: Self-pay | Admitting: Cardiology

## 2022-11-23 DIAGNOSIS — E781 Pure hyperglyceridemia: Secondary | ICD-10-CM

## 2022-11-27 ENCOUNTER — Ambulatory Visit: Payer: Managed Care, Other (non HMO) | Attending: Student | Admitting: Occupational Therapy

## 2022-11-27 ENCOUNTER — Encounter: Payer: Self-pay | Admitting: Occupational Therapy

## 2022-11-27 DIAGNOSIS — L905 Scar conditions and fibrosis of skin: Secondary | ICD-10-CM

## 2022-11-27 DIAGNOSIS — M25631 Stiffness of right wrist, not elsewhere classified: Secondary | ICD-10-CM | POA: Diagnosis present

## 2022-11-27 DIAGNOSIS — M6281 Muscle weakness (generalized): Secondary | ICD-10-CM | POA: Insufficient documentation

## 2022-11-27 DIAGNOSIS — M25531 Pain in right wrist: Secondary | ICD-10-CM | POA: Insufficient documentation

## 2022-11-27 NOTE — Therapy (Signed)
[facility] [address] [place], [address] Phone: [phone]   Fax:  [phone]  Occupational Therapy Evaluation  Patient Details  Name: GEORGIA BARIA MRN: [id number] Date of Birth: 04-Jun-1966 Referring Provider (OT): [place] PA   Encounter Date: 11/27/2022   OT End of Session - 11/27/22 1626     Visit Number 1    Number of Visits 12    Date for OT Re-Evaluation 01/08/23    OT Start Time 0915    OT Stop Time 0959    OT Time Calculation (min) 44 min    Activity Tolerance Patient tolerated treatment well    Behavior During Therapy [facility] for tasks assessed/performed             Past Medical History:  Diagnosis Date   Anemia    Arthritis    Asthma    sports induced asthma. takes inhalers when needed   Barrett's esophagus without dysplasia    Cancer (HCC)    Basal and squamoous cell   Chronic kidney disease    Complication of anesthesia    high tolerance to pain medication. body absorbs pain med quickly   Coronary artery disease    Cough on exercise    Diabetes mellitus without complication ([place])    Diverticulosis    Erectile dysfunction    Fatty liver    GERD (gastroesophageal reflux disease)    History of kidney stones    Hyperlipidemia    Hypertension    per patient, he does not have high bp but is treated for his kidneys and his diabetes   Pancreatitis    Right shoulder injury    Sleep apnea    use C-PAP    Past Surgical History:  Procedure Laterality Date   ANKLE GANGLION CYST EXCISION Left    x 5   CARPAL TUNNEL RELEASE Right 10/03/2018   Procedure: CARPAL TUNNEL RELEASE;  Surgeon: Earnestine Leys, MD;  Location: [facility];  Service: Orthopedics;  Laterality: Right;   CARPAL TUNNEL RELEASE Left 10/24/2018   Procedure: CARPAL TUNNEL RELEASE;  Surgeon: Earnestine Leys, MD;  Location: [facility];  Service: Orthopedics;  Laterality: Left;   COLONOSCOPY  05/10/2014   COLONOSCOPY WITH PROPOFOL  N/A 08/30/2019   Procedure: COLONOSCOPY WITH PROPOFOL;  Surgeon: Toledo, Benay Pike, MD;  Location: [facility];  Service: Gastroenterology;  Laterality: N/A;   CYSTOSCOPY WITH STENT PLACEMENT Bilateral 06/05/2016   Procedure: CYSTOSCOPY WITH STENT PLACEMENT;  Surgeon: Nickie Retort, MD;  Location: [facility];  Service: Urology;  Laterality: Bilateral;   CYSTOSCOPY/URETEROSCOPY/HOLMIUM LASER/STENT PLACEMENT Left 04/13/2016   Procedure: CYSTOSCOPY/RETROGRADE PYELOGRAM/URETEROSCOPY WITH HOLMIUM LASER LITHOTRIPSY//STENT PLACEMENT;  Surgeon: Festus Aloe, MD;  Location: [facility];  Service: Urology;  Laterality: Left;   ESOPHAGOGASTRODUODENOSCOPY (EGD) WITH PROPOFOL N/A 12/27/2015   Procedure: ESOPHAGOGASTRODUODENOSCOPY (EGD) WITH PROPOFOL;  Surgeon: Manya Silvas, MD;  Location: [facility];  Service: Endoscopy;  Laterality: N/A;   ESOPHAGOGASTRODUODENOSCOPY (EGD) WITH PROPOFOL N/A 08/30/2019   Procedure: ESOPHAGOGASTRODUODENOSCOPY (EGD) WITH PROPOFOL;  Surgeon: Toledo, Benay Pike, MD;  Location: [facility];  Service: Gastroenterology;  Laterality: N/A;   EXTRACORPOREAL SHOCK WAVE LITHOTRIPSY Right 12/30/2017   Procedure: EXTRACORPOREAL SHOCK WAVE LITHOTRIPSY (ESWL);  Surgeon: Royston Cowper, MD;  Location: [facility];  Service: Urology;  Laterality: Right;   EXTRACORPOREAL SHOCK WAVE LITHOTRIPSY Left 03/19/2022   Procedure: EXTRACORPOREAL SHOCK WAVE LITHOTRIPSY (ESWL);  Surgeon: Royston Cowper, MD;  Location: [facility];  Service: Urology;  Laterality:  Left;   EXTRACORPOREAL SHOCK WAVE LITHOTRIPSY Right 08/20/2022   Procedure: EXTRACORPOREAL SHOCK WAVE LITHOTRIPSY (ESWL);  Surgeon: Royston Cowper, MD;  Location: [facility];  Service: Urology;  Laterality: Right;   GANGLION CYST EXCISION Right 08/12/2022   Procedure: EXCISION OF DORSAL CARPAL GANGLION OF RIGHT WRIST;  Surgeon: Corky Mull, MD;  Location: [facility];  Service: Orthopedics;  Laterality: Right;   HERNIA REPAIR  [id number]    umbilical   KNEE ARTHROSCOPY Right 2015   had bursa sack repaired   KNEE ARTHROSCOPY WITH MEDIAL MENISECTOMY Right 10/11/2020   Procedure: Right knee arthroscopy partial medial meniscectomy;  Surgeon: Leim Fabry, MD;  Location: [facility];  Service: Orthopedics;  Laterality: Right;  Diabetic - oral meds sleep apnea   KNEE ARTHROSCOPY WITH MENISCAL REPAIR Right 11/18/2018   Procedure: KNEE ARTHROSCOPY WITH MENISCAL REPAIR AND CHONDROPLASTY;  Surgeon: Leim Fabry, MD;  Location: [facility];  Service: Orthopedics;  Laterality: Right;  Diabetic - oral meds sleep apnea SUPINE WITH ACL LEG HOLDER SMITH AND NEWPHEW CETERIX   REPAIR EXTENSOR TENDON Right 10/22/2022   Procedure: EXTENSOR TENOSYNOVECTOMY OF RIGHT WRIST;  Surgeon: Corky Mull, MD;  Location: [facility];  Service: Orthopedics;  Laterality: Right;   SHOULDER SURGERY Right 2011   tendon was shredded and was trimmed, repaired and reattached. Screws in shoulder   TOTAL HIP ARTHROPLASTY Left 10/28/2021   Procedure: TOTAL HIP ARTHROPLASTY;  Surgeon: Corky Mull, MD;  Location: [facility];  Service: Orthopedics;  Laterality: Left;   URETEROSCOPY WITH HOLMIUM LASER LITHOTRIPSY Bilateral 06/05/2016   Procedure: URETEROSCOPY WITH HOLMIUM LASER LITHOTRIPSY;  Surgeon: Nickie Retort, MD;  Location: [facility];  Service: Urology;  Laterality: Bilateral;   VASECTOMY  2002    There were no vitals filed for this visit.   Subjective Assessment - 11/27/22 1603     Subjective  I use my hands a lot at work and need my wrist to do my job. My wrist do not want to bend up and down. Scar tender and pain at times shooting when trying to do something I cannot do yet    Pertinent History 11/20/22 Ortho note : Jesse Alvarado is a 57 y.o. male who presents today for a repeat skin check status post a revision extensive tenosynovectomy involving the dorsal aspect of the right wrist. The patient is now 4 weeks status post surgery, surgery was  performed by Dr. Roland Rack on 10/22/2022. At his initial postop visit the patient was instructed to continue to wear the Velcro wrist splint routinely except for showers and continue with the Ace wrap applied to the right wrist to minimize the amount of swelling along the dorsal aspect of the right wrist. The patient denies any trauma or injury affecting right wrist since his procedure. He denies any signs of infection at home such as fevers chills or any drainage from the dorsal aspect of the right wrist. The patient has not noticed any significant focal swelling like he did after his initial tenosynovectomy. The patient has been out of work since the surgery. He does still have swelling noted in his fingers but states that this is improving at this time. The patient reports a 2 out of 10 pain score in the right upper extremity at today's visit. He is not taking any medication consistently for the right wrist at this time. REfer to OT    Patient Stated Goals Want my motion and strength back in my R wrist so  I can go back to work    Currently in Pain? 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 Prior to review of home program. Reviewed with patient wrist flexion extension active assisted range of motion over edge of table 12 reps. Also prior stretch and wrist flexion stretches to be done if 1-2 extra. Keeping pain under 2/10. Patient educated in scar mobilization and massage-as well as use of Cica -Care scar pad at nighttime. Isotoner glove at nighttime. AROM for wrist flexion extension after active assisted and passive motion.  Patient to do heat or contrast prior to home program.           OT Education - 11/27/22 1615     Education Details FIndings of eval and HEP    Person(s) Educated Patient    Methods Explanation;Demonstration;Tactile cues;Verbal cues;Handout    Comprehension Verbalized understanding;Returned demonstration;Verbal cues required              OT Short Term Goals - 11/27/22 1631       OT SHORT TERM GOAL #1   Title Patient to be independent in home program to decrease scar tissue and edema and increase wrist flexion extension.    Baseline No knowledge of home program.  Wrist extension 35, flat 25 to wrist.    Time 3    Period Weeks    Status New    Target Date 12/18/22               OT Long Term Goals - 11/27/22 1631       OT LONG TERM GOAL #1   Title Right wrist flexion extension increased to within functional limits for patient to push and pull heavy door, twist and turn knobs without increase symptoms    Baseline Right wrist extension  35 degrees, and 25 degrees.  Pain with functional use can increase to 3/10.  With sometimes shooting or sharp pain 7/10.    Time 6    Period Weeks    Status New    Target Date  01/08/23      OT LONG TERM GOAL #2   Title Strength in the right wrist increased to 5/5 for patient to return back to work without increase symptoms    Baseline Patient 5 weeks postop flexion 25, extension 35 degrees.  Pain 3-7/10.  No strengthening yet.    Time 6    Period Weeks    Status New    Target Date 01/08/23      OT LONG TERM GOAL #3   Title Right grip and prehension strength increased to more than 75% compared to the left with increased symptoms to return back to work.    Baseline Not tested patient 5 weeks postop limited flexion extension of the wrist to 25-35 degrees.  In 3- 7/10 at rest    Time 6    Period Weeks    Status New    Target Date 01/08/23                   Plan - 11/27/22 1628     Clinical Impression Statement Patient presented OT evaluation 5 weeks post right dorsal wrist tenosynovectomy.  Patient had a cyst removed in October 23.  Patient present with decreased right dominant wrist flexion extension, increased scar tissue increased pain with some edema in hand.  Patient limited in functional use of right dominant hand in ADLs and IADLs as well as to return to work.  Patient can benefit from skilled OT services to increase motion, increase scar tissue and pain increase strength to return back to prior level of function.    OT Occupational Profile and History Problem Focused Assessment - Including review of records relating to presenting problem    Occupational performance deficits (Please refer to evaluation for details): ADL's;IADL's;Work;Social Participation;Leisure;Play    Body Structure / Function / Physical Skills ADL;Scar mobility;UE functional use;Flexibility;IADL;Pain;Strength;Edema;ROM    Rehab Potential Good    Clinical Decision Making Limited treatment options, no task modification necessary    Comorbidities Affecting Occupational Performance: None    Modification or Assistance to Complete Evaluation  No modification of tasks or assist  necessary to complete eval    OT Frequency 2x / week    OT Duration 6 weeks    OT Treatment/Interventions Self-care/ADL training;Moist Heat;Paraffin;Fluidtherapy;Contrast Bath;Passive range of motion;Therapeutic activities;Splinting;Scar mobilization;Manual Therapy;Therapeutic exercise;Patient/family education    Consulted and Agree with Plan of Care Patient             Patient will benefit from skilled therapeutic intervention in order to improve the following deficits and impairments:   Body Structure / Function / Physical Skills: ADL, Scar mobility, UE functional use, Flexibility, IADL, Pain, Strength, Edema, ROM       Visit Diagnosis: Pain in right wrist  Stiffness of right wrist, not elsewhere classified  Scar condition and fibrosis of skin  Muscle weakness (generalized)    Problem List Patient Active Problem List   Diagnosis Date Noted   Status post total hip replacement, left 10/28/2021   Dyslipidemia 08/24/2021   Status post colonoscopy 06/02/2019   Hepatic steatosis 06/02/2019   Barrett's esophagus without dysplasia 01/19/2019   High coronary artery calcium score 12/29/2018   Chronic iron deficiency anemia 12/22/2017   Gastritis without bleeding 06/25/2017  Vitamin B12 deficiency 08/03/2016   OSA on CPAP 04/18/2016   Nephrolithiasis 04/13/2016   Chronic midline low back pain without sciatica 01/25/2016   Primary osteoarthritis involving multiple joints 11/20/2015   Bilateral carpal tunnel syndrome 09/14/2015   Essential hypertension 09/14/2015   Chronic knee pain 08/30/2014   Erectile dysfunction 08/30/2014   Other allergic rhinitis 08/30/2014   Onychomycosis of toenail 07/24/2014   Hyperlipidemia associated with type 2 diabetes mellitus ([place]) 04/23/2014   Type II diabetes mellitus with complication ([place]) 86/38/1771    Rosalyn Gess, OTR/L,CLT 11/27/2022, 4:35 PM  [facility] [address], [place], [address] Phone: [phone]   Fax:  [phone]  Name: RIKU BUTTERY MRN: [id number] Date of Birth: Jan 07, 1966

## 2022-11-30 ENCOUNTER — Encounter: Payer: Self-pay | Admitting: Occupational Therapy

## 2022-11-30 ENCOUNTER — Ambulatory Visit: Payer: Managed Care, Other (non HMO) | Admitting: Occupational Therapy

## 2022-11-30 DIAGNOSIS — L905 Scar conditions and fibrosis of skin: Secondary | ICD-10-CM

## 2022-11-30 DIAGNOSIS — M6281 Muscle weakness (generalized): Secondary | ICD-10-CM

## 2022-11-30 DIAGNOSIS — M25531 Pain in right wrist: Secondary | ICD-10-CM | POA: Diagnosis not present

## 2022-11-30 DIAGNOSIS — M25631 Stiffness of right wrist, not elsewhere classified: Secondary | ICD-10-CM

## 2022-11-30 NOTE — Therapy (Signed)
[facility] [address], [place], [address] Phone: [phone]   Fax:  [phone]  Occupational Therapy Treatment  Patient Details  Name: BASHIR MARCHETTI MRN: [id number] Date of Birth: July 12, 1966 Referring Provider (OT): Mack PA   Encounter Date: 11/30/2022   OT End of Session - 12/01/22 0254     Visit Number 2    Number of Visits 12    Date for OT Re-Evaluation 01/08/23    OT Start Time 1610    OT Stop Time 1658    OT Time Calculation (min) 48 min    Activity Tolerance Patient tolerated treatment well    Behavior During Therapy [facility] for tasks assessed/performed             Past Medical History:  Diagnosis Date   Anemia    Arthritis    Asthma    sports induced asthma. takes inhalers when needed   Barrett's esophagus without dysplasia    Cancer (HCC)    Basal and squamoous cell   Chronic kidney disease    Complication of anesthesia    high tolerance to pain medication. body absorbs pain med quickly   Coronary artery disease    Cough on exercise    Diabetes mellitus without complication ([place])    Diverticulosis    Erectile dysfunction    Fatty liver    GERD (gastroesophageal reflux disease)    History of kidney stones    Hyperlipidemia    Hypertension    per patient, he does not have high bp but is treated for his kidneys and his diabetes   Pancreatitis    Right shoulder injury    Sleep apnea    use C-PAP    Past Surgical History:  Procedure Laterality Date   ANKLE GANGLION CYST EXCISION Left    x 5   CARPAL TUNNEL RELEASE Right 10/03/2018   Procedure: CARPAL TUNNEL RELEASE;  Surgeon: Earnestine Leys, MD;  Location: [facility];  Service: Orthopedics;  Laterality: Right;   CARPAL TUNNEL RELEASE Left 10/24/2018   Procedure: CARPAL TUNNEL RELEASE;  Surgeon: Earnestine Leys, MD;  Location: [facility];  Service: Orthopedics;  Laterality: Left;   COLONOSCOPY  05/10/2014   COLONOSCOPY WITH PROPOFOL  N/A 08/30/2019   Procedure: COLONOSCOPY WITH PROPOFOL;  Surgeon: Toledo, Benay Pike, MD;  Location: [facility];  Service: Gastroenterology;  Laterality: N/A;   CYSTOSCOPY WITH STENT PLACEMENT Bilateral 06/05/2016   Procedure: CYSTOSCOPY WITH STENT PLACEMENT;  Surgeon: Nickie Retort, MD;  Location: [facility];  Service: Urology;  Laterality: Bilateral;   CYSTOSCOPY/URETEROSCOPY/HOLMIUM LASER/STENT PLACEMENT Left 04/13/2016   Procedure: CYSTOSCOPY/RETROGRADE PYELOGRAM/URETEROSCOPY WITH HOLMIUM LASER LITHOTRIPSY//STENT PLACEMENT;  Surgeon: Festus Aloe, MD;  Location: [facility];  Service: Urology;  Laterality: Left;   ESOPHAGOGASTRODUODENOSCOPY (EGD) WITH PROPOFOL N/A 12/27/2015   Procedure: ESOPHAGOGASTRODUODENOSCOPY (EGD) WITH PROPOFOL;  Surgeon: Manya Silvas, MD;  Location: [facility];  Service: Endoscopy;  Laterality: N/A;   ESOPHAGOGASTRODUODENOSCOPY (EGD) WITH PROPOFOL N/A 08/30/2019   Procedure: ESOPHAGOGASTRODUODENOSCOPY (EGD) WITH PROPOFOL;  Surgeon: Toledo, Benay Pike, MD;  Location: [facility];  Service: Gastroenterology;  Laterality: N/A;   EXTRACORPOREAL SHOCK WAVE LITHOTRIPSY Right 12/30/2017   Procedure: EXTRACORPOREAL SHOCK WAVE LITHOTRIPSY (ESWL);  Surgeon: Royston Cowper, MD;  Location: [facility];  Service: Urology;  Laterality: Right;   EXTRACORPOREAL SHOCK WAVE LITHOTRIPSY Left 03/19/2022   Procedure: EXTRACORPOREAL SHOCK WAVE LITHOTRIPSY (ESWL);  Surgeon: Royston Cowper, MD;  Location: [facility];  Service: Urology;  Laterality:  Left;   EXTRACORPOREAL SHOCK WAVE LITHOTRIPSY Right 08/20/2022   Procedure: EXTRACORPOREAL SHOCK WAVE LITHOTRIPSY (ESWL);  Surgeon: Royston Cowper, MD;  Location: [facility];  Service: Urology;  Laterality: Right;   GANGLION CYST EXCISION Right 08/12/2022   Procedure: EXCISION OF DORSAL CARPAL GANGLION OF RIGHT WRIST;  Surgeon: Corky Mull, MD;  Location: [facility];  Service: Orthopedics;  Laterality: Right;   HERNIA REPAIR  5366    umbilical   KNEE ARTHROSCOPY Right 2015   had bursa sack repaired   KNEE ARTHROSCOPY WITH MEDIAL MENISECTOMY Right 10/11/2020   Procedure: Right knee arthroscopy partial medial meniscectomy;  Surgeon: Leim Fabry, MD;  Location: [place];  Service: Orthopedics;  Laterality: Right;  Diabetic - oral meds sleep apnea   KNEE ARTHROSCOPY WITH MENISCAL REPAIR Right 11/18/2018   Procedure: KNEE ARTHROSCOPY WITH MENISCAL REPAIR AND CHONDROPLASTY;  Surgeon: Leim Fabry, MD;  Location: [place];  Service: Orthopedics;  Laterality: Right;  Diabetic - oral meds sleep apnea SUPINE WITH ACL LEG HOLDER SMITH AND NEWPHEW CETERIX   REPAIR EXTENSOR TENDON Right 10/22/2022   Procedure: EXTENSOR TENOSYNOVECTOMY OF RIGHT WRIST;  Surgeon: Corky Mull, MD;  Location: [facility];  Service: Orthopedics;  Laterality: Right;   SHOULDER SURGERY Right 2011   tendon was shredded and was trimmed, repaired and reattached. Screws in shoulder   TOTAL HIP ARTHROPLASTY Left 10/28/2021   Procedure: TOTAL HIP ARTHROPLASTY;  Surgeon: Corky Mull, MD;  Location: [facility];  Service: Orthopedics;  Laterality: Left;   URETEROSCOPY WITH HOLMIUM LASER LITHOTRIPSY Bilateral 06/05/2016   Procedure: URETEROSCOPY WITH HOLMIUM LASER LITHOTRIPSY;  Surgeon: Nickie Retort, MD;  Location: [facility];  Service: Urology;  Laterality: Bilateral;   VASECTOMY  2002    There were no vitals filed for this visit.   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  Pain 2-3 out of 10 this date.    Pertinent History 11/20/22 Ortho note : Jesse Alvarado is a 57 y.o. male who presents today for a repeat skin check status post a revision extensive tenosynovectomy involving the dorsal aspect of the right wrist. The patient is now 4 weeks status post surgery, surgery was performed by Dr. Roland Rack on 10/22/2022. At his initial postop visit the patient was instructed to continue to  wear the Velcro wrist splint routinely except for showers and continue with the Ace wrap applied to the right wrist to minimize the amount of swelling along the dorsal aspect of the right wrist. The patient denies any trauma or injury affecting right wrist since his procedure. He denies any signs of infection at home such as fevers chills or any drainage from the dorsal aspect of the right wrist. The patient has not noticed any significant focal swelling like he did after his initial tenosynovectomy. The patient has been out of work since the surgery. He does still have swelling noted in his fingers but states that this is improving at this time. The patient reports a 2 out of 10 pain score in the right upper extremity at today's visit. He is not taking any medication consistently for the right wrist at this time. REfer to OT    Patient Stated Goals Want my motion and strength back in my R wrist so I can go back to work    Currently in Pain? Yes    Pain Score 3     Pain Location Wrist  Pain Orientation Right    Pain Descriptors / Indicators Aching;Tightness;Tender;Sharp    Pain Type Surgical pain    Pain Onset More than a month ago    Pain Frequency Intermittent             Fluidotherapy: Pt seen for use of fluidotherapy to wrist and hand for 10 mins to decrease edema, decrease pain and increase tissue mobility and ROM.  Pt instructed on active ROM of wrist and hand while in fluidotherapy, all planes of motion.   Manual Therapy:  Following fluidotherapy, pt was seen for manual therapy skills with carpal and MC spreads to promote lymphatic flow to decrease edema in wrist and hand.  Scar massage performed by therapist to increase scar mobility, healing and decrease risk of adhesions.  Pt has Cica care for night time.   Measurements taken to reflect current level of edema as follows: Right wrist 18 cm, left 16.7 cm Index finger base 8 cm, 7 cm  Therapeutic Exercises:  wrist flexion  extension active assisted range of motion over edge of table 12 reps.  AROM for wrist flexion extension after active assisted and passive motion.   Pain 2-3/10 ROM this date: 40 degrees extension  35 degrees wrist flexion  25 degrees radial deviation 34 degrees ulnar deviation    Pt to continue to utilize contrast at home for edema control, self directed scar massage daily, performance of HEP after contrast, isotoner and cica care at night.         OT Education - 12/02/22 1633     Education Details use of contrast,  HEP    Person(s) Educated Patient    Methods Explanation;Demonstration;Tactile cues;Verbal cues;Handout    Comprehension Verbalized understanding;Returned demonstration;Verbal cues required              OT Short Term Goals - 11/27/22 1631       OT SHORT TERM GOAL #1   Title Patient to be independent in home program to decrease scar tissue and edema and increase wrist flexion extension.    Baseline No knowledge of home program.  Wrist extension 35, flat 25 to wrist.    Time 3    Period Weeks    Status New    Target Date 12/18/22               OT Long Term Goals - 11/27/22 1631       OT LONG TERM GOAL #1   Title Right wrist flexion extension increased to within functional limits for patient to push and pull heavy door, twist and turn knobs without increase symptoms    Baseline Right wrist extension 35 degrees, and 25 degrees.  Pain with functional use can increase to 3/10.  With sometimes shooting or sharp pain 7/10.    Time 6    Period Weeks    Status New    Target Date 01/08/23      OT LONG TERM GOAL #2   Title Strength in the right wrist increased to 5/5 for patient to return back to work without increase symptoms    Baseline Patient 5 weeks postop flexion 25, extension 35 degrees.  Pain 3-7/10.  No strengthening yet.    Time 6    Period Weeks    Status New    Target Date 01/08/23      OT LONG TERM GOAL #3   Title Right grip and  prehension strength increased to more than 75% compared to the left with increased  symptoms to return back to work.    Baseline Not tested patient 5 weeks postop limited flexion extension of the wrist to 25-35 degrees.  In 3- 7/10 at rest    Time 6    Period Weeks    Status New    Target Date 01/08/23                   Plan - 12/02/22 1634     Clinical Impression Statement Patient presented OT evaluation 5 weeks post right dorsal wrist tenosynovectomy.  Patient had a cyst removed in October 23.  Patient present with decreased right dominant wrist flexion extension, increased scar tissue increased pain with some edema in hand.  Patient limited in functional use of right dominant hand in ADLs and IADLs as well as to return to work. Pt continues to present with increased edema in right hand and wrist which continues to limit his functional use and motion.  Progressing well with exercises, scar massage and home program, increase in ROM measurements for all wrist motions.  Patient can benefit from skilled OT services to increase motion, increase scar tissue and pain increase strength to return back to prior level of function.    OT Occupational Profile and History Problem Focused Assessment - Including review of records relating to presenting problem    Occupational performance deficits (Please refer to evaluation for details): ADL's;IADL's;Work;Social Participation;Leisure;Play    Body Structure / Function / Physical Skills ADL;Scar mobility;UE functional use;Flexibility;IADL;Pain;Strength;Edema;ROM    Rehab Potential Good    Clinical Decision Making Limited treatment options, no task modification necessary    Comorbidities Affecting Occupational Performance: None    Modification or Assistance to Complete Evaluation  No modification of tasks or assist necessary to complete eval    OT Frequency 2x / week    OT Duration 6 weeks    OT Treatment/Interventions Self-care/ADL training;Moist  Heat;Paraffin;Fluidtherapy;Contrast Bath;Passive range of motion;Therapeutic activities;Splinting;Scar mobilization;Manual Therapy;Therapeutic exercise;Patient/family education    Consulted and Agree with Plan of Care Patient             Patient will benefit from skilled therapeutic intervention in order to improve the following deficits and impairments:   Body Structure / Function / Physical Skills: ADL, Scar mobility, UE functional use, Flexibility, IADL, Pain, Strength, Edema, ROM       Visit Diagnosis: Pain in right wrist  Stiffness of right wrist, not elsewhere classified  Scar condition and fibrosis of skin  Muscle weakness (generalized)    Problem List Patient Active Problem List   Diagnosis Date Noted   Status post total hip replacement, left 10/28/2021   Dyslipidemia 08/24/2021   Status post colonoscopy 06/02/2019   Hepatic steatosis 06/02/2019   Barrett's esophagus without dysplasia 01/19/2019   High coronary artery calcium score 12/29/2018   Chronic iron deficiency anemia 12/22/2017   Gastritis without bleeding 06/25/2017   Vitamin B12 deficiency 08/03/2016   OSA on CPAP 04/18/2016   Nephrolithiasis 04/13/2016   Chronic midline low back pain without sciatica 01/25/2016   Primary osteoarthritis involving multiple joints 11/20/2015   Bilateral carpal tunnel syndrome 09/14/2015   Essential hypertension 09/14/2015   Chronic knee pain 08/30/2014   Erectile dysfunction 08/30/2014   Other allergic rhinitis 08/30/2014   Onychomycosis of toenail 07/24/2014   Hyperlipidemia associated with type 2 diabetes mellitus ([place]) 04/23/2014   Type II diabetes mellitus with complication ([place]) 21/19/4174   Ciani Rutten T Myeasha Ballowe, OTR/L, CLT  Lenise Jr, OT 12/02/2022, 8:39 PM  [facility] [address] [place], [address] Phone: [phone]   Fax:  [phone]  Name: CLAVIN RUHLMAN MRN: [id number] Date of Birth:  1965-12-11

## 2022-12-02 ENCOUNTER — Other Ambulatory Visit: Payer: Self-pay

## 2022-12-02 MED ORDER — EZETIMIBE 10 MG PO TABS: 10.0000 mg | ORAL_TABLET | Freq: Every day | ORAL | 3 refills | Status: DC

## 2022-12-03 ENCOUNTER — Ambulatory Visit: Payer: Managed Care, Other (non HMO) | Admitting: Occupational Therapy

## 2022-12-03 DIAGNOSIS — M25531 Pain in right wrist: Secondary | ICD-10-CM | POA: Diagnosis not present

## 2022-12-03 DIAGNOSIS — M25631 Stiffness of right wrist, not elsewhere classified: Secondary | ICD-10-CM

## 2022-12-03 DIAGNOSIS — M6281 Muscle weakness (generalized): Secondary | ICD-10-CM

## 2022-12-03 DIAGNOSIS — L905 Scar conditions and fibrosis of skin: Secondary | ICD-10-CM

## 2022-12-03 NOTE — Therapy (Signed)
[facility] [address], [place], [address] Phone: [phone]   Fax:  [phone]  Occupational Therapy Treatment  Patient Details  Name: Jesse Alvarado MRN: [id number] Date of Birth: December 10, 1965 Referring Provider (OT): Mayfield PA   Encounter Date: 12/03/2022   OT End of Session - 12/03/22 1206     Visit Number 3    Number of Visits 12    Date for OT Re-Evaluation 01/08/23    OT Start Time 1120    OT Stop Time 1200    OT Time Calculation (min) 40 min    Activity Tolerance Patient tolerated treatment well    Behavior During Therapy Jesse Alvarado for tasks assessed/performed             Past Medical History:  Diagnosis Date   Anemia    Arthritis    Asthma    sports induced asthma. takes inhalers when needed   Barrett's esophagus without dysplasia    Cancer (HCC)    Basal and squamoous cell   Chronic kidney disease    Complication of anesthesia    high tolerance to pain medication. body absorbs pain med quickly   Coronary artery disease    Cough on exercise    Diabetes mellitus without complication ([place])    Diverticulosis    Erectile dysfunction    Fatty liver    GERD (gastroesophageal reflux disease)    History of kidney stones    Hyperlipidemia    Hypertension    per patient, he does not have high bp but is treated for his kidneys and his diabetes   Pancreatitis    Right shoulder injury    Sleep apnea    use C-PAP    Past Surgical History:  Procedure Laterality Date   ANKLE GANGLION CYST EXCISION Left    x 5   CARPAL TUNNEL RELEASE Right 10/03/2018   Procedure: CARPAL TUNNEL RELEASE;  Surgeon: Earnestine Leys, MD;  Location: [facility];  Service: Orthopedics;  Laterality: Right;   CARPAL TUNNEL RELEASE Left 10/24/2018   Procedure: CARPAL TUNNEL RELEASE;  Surgeon: Earnestine Leys, MD;  Location: [facility];  Service: Orthopedics;  Laterality: Left;   COLONOSCOPY  05/10/2014   COLONOSCOPY WITH PROPOFOL  N/A 08/30/2019   Procedure: COLONOSCOPY WITH PROPOFOL;  Surgeon: Toledo, Benay Pike, MD;  Location: [facility];  Service: Gastroenterology;  Laterality: N/A;   CYSTOSCOPY WITH STENT PLACEMENT Bilateral 06/05/2016   Procedure: CYSTOSCOPY WITH STENT PLACEMENT;  Surgeon: Nickie Retort, MD;  Location: [facility];  Service: Urology;  Laterality: Bilateral;   CYSTOSCOPY/URETEROSCOPY/HOLMIUM LASER/STENT PLACEMENT Left 04/13/2016   Procedure: CYSTOSCOPY/RETROGRADE PYELOGRAM/URETEROSCOPY WITH HOLMIUM LASER LITHOTRIPSY//STENT PLACEMENT;  Surgeon: Festus Aloe, MD;  Location: [facility];  Service: Urology;  Laterality: Left;   ESOPHAGOGASTRODUODENOSCOPY (EGD) WITH PROPOFOL N/A 12/27/2015   Procedure: ESOPHAGOGASTRODUODENOSCOPY (EGD) WITH PROPOFOL;  Surgeon: Manya Silvas, MD;  Location: [facility];  Service: Endoscopy;  Laterality: N/A;   ESOPHAGOGASTRODUODENOSCOPY (EGD) WITH PROPOFOL N/A 08/30/2019   Procedure: ESOPHAGOGASTRODUODENOSCOPY (EGD) WITH PROPOFOL;  Surgeon: Toledo, Benay Pike, MD;  Location: [facility];  Service: Gastroenterology;  Laterality: N/A;   EXTRACORPOREAL SHOCK WAVE LITHOTRIPSY Right 12/30/2017   Procedure: EXTRACORPOREAL SHOCK WAVE LITHOTRIPSY (ESWL);  Surgeon: Royston Cowper, MD;  Location: [facility];  Service: Urology;  Laterality: Right;   EXTRACORPOREAL SHOCK WAVE LITHOTRIPSY Left 03/19/2022   Procedure: EXTRACORPOREAL SHOCK WAVE LITHOTRIPSY (ESWL);  Surgeon: Royston Cowper, MD;  Location: [facility];  Service: Urology;  Laterality:  Left;   EXTRACORPOREAL SHOCK WAVE LITHOTRIPSY Right 08/20/2022   Procedure: EXTRACORPOREAL SHOCK WAVE LITHOTRIPSY (ESWL);  Surgeon: Royston Cowper, MD;  Location: [facility];  Service: Urology;  Laterality: Right;   GANGLION CYST EXCISION Right 08/12/2022   Procedure: EXCISION OF DORSAL CARPAL GANGLION OF RIGHT WRIST;  Surgeon: Corky Mull, MD;  Location: [facility];  Service: Orthopedics;  Laterality: Right;   HERNIA REPAIR  9702    umbilical   KNEE ARTHROSCOPY Right 2015   had bursa sack repaired   KNEE ARTHROSCOPY WITH MEDIAL MENISECTOMY Right 10/11/2020   Procedure: Right knee arthroscopy partial medial meniscectomy;  Surgeon: Leim Fabry, MD;  Location: [facility];  Service: Orthopedics;  Laterality: Right;  Diabetic - oral meds sleep apnea   KNEE ARTHROSCOPY WITH MENISCAL REPAIR Right 11/18/2018   Procedure: KNEE ARTHROSCOPY WITH MENISCAL REPAIR AND CHONDROPLASTY;  Surgeon: Leim Fabry, MD;  Location: [facility];  Service: Orthopedics;  Laterality: Right;  Diabetic - oral meds sleep apnea SUPINE WITH ACL LEG HOLDER SMITH AND NEWPHEW CETERIX   REPAIR EXTENSOR TENDON Right 10/22/2022   Procedure: EXTENSOR TENOSYNOVECTOMY OF RIGHT WRIST;  Surgeon: Corky Mull, MD;  Location: [facility];  Service: Orthopedics;  Laterality: Right;   SHOULDER SURGERY Right 2011   tendon was shredded and was trimmed, repaired and reattached. Screws in shoulder   TOTAL HIP ARTHROPLASTY Left 10/28/2021   Procedure: TOTAL HIP ARTHROPLASTY;  Surgeon: Corky Mull, MD;  Location: [facility];  Service: Orthopedics;  Laterality: Left;   URETEROSCOPY WITH HOLMIUM LASER LITHOTRIPSY Bilateral 06/05/2016   Procedure: URETEROSCOPY WITH HOLMIUM LASER LITHOTRIPSY;  Surgeon: Nickie Retort, MD;  Location: [facility];  Service: Urology;  Laterality: Bilateral;   VASECTOMY  2002    There were no vitals filed for this visit.   Subjective Assessment - 12/03/22 1204     Subjective  The one way I am improving more than the other - and tender this one spot on the top of my wrist    Pertinent History 11/20/22 Ortho note : Jesse Alvarado is a 57 y.o. male who presents today for a repeat skin check status post a revision extensive tenosynovectomy involving the dorsal aspect of the right wrist. The patient is now 4 weeks status post surgery, surgery was performed by Dr. Roland Rack on 10/22/2022. At his initial postop visit the patient was  instructed to continue to wear the Velcro wrist splint routinely except for showers and continue with the Ace wrap applied to the right wrist to minimize the amount of swelling along the dorsal aspect of the right wrist. The patient denies any trauma or injury affecting right wrist since his procedure. He denies any signs of infection at home such as fevers chills or any drainage from the dorsal aspect of the right wrist. The patient has not noticed any significant focal swelling like he did after his initial tenosynovectomy. The patient has been out of work since the surgery. He does still have swelling noted in his fingers but states that this is improving at this time. The patient reports a 2 out of 10 pain score in the right upper extremity at today's visit. He is not taking any medication consistently for the right wrist at this time. REfer to OT    Patient Stated Goals Want my motion and strength back in my R wrist so I can go back to work    Currently in Pain? Yes    Pain Score 3  Pain Location Wrist    Pain Orientation Right    Pain Descriptors / Indicators Tender;Tightness    Pain Type Surgical pain    Pain Onset More than a month ago    Pain Frequency Intermittent                [facility] OT Assessment - 12/03/22 0001       AROM   Right Wrist Extension 40 Degrees   in session 50   Right Wrist Flexion 35 Degrees   in session 45   Right Wrist Radial Deviation 22 Degrees    Right Wrist Ulnar Deviation 30 Degrees              Patient present with increased wrist extension more than flexion.  Pain, tightness and discomfort over dorsal wrist with scar.  Patient also with some tenderness and pain over ECU but later appears more along the extensor. Radial ulnar deviation active range of motion within normal limits.        OT Treatments/Exercises (OP) - 12/03/22 0001       RUE Paraffin   Number Minutes Paraffin 8 Minutes    RUE Paraffin Location Wrist    Comments 2 min  wrist flexoin, ext stetch x 2            Change home program for patient to use his paraffin bath at home.  With a prolonged 2 minutes flexion extension stretch. Done some scar massage as well as sweeping using [place] #2 over volar and dorsal wrist and forearm. Gentle joint mobs and traction done for wrist pain-free. Prior to doing CPM for wrist flexion extension 200 seconds each. Patient to focus on close fist wrist flexion extension stretches prior to active range of motion. Can do ice at the end  Patient to make sure he does not compensate with long extensors with wrist extension.        OT Education - 12/03/22 1206     Education Details use of contrast,  HEP    Person(s) Educated Patient    Methods Explanation;Demonstration;Tactile cues;Verbal cues;Handout    Comprehension Verbalized understanding;Returned demonstration;Verbal cues required              OT Short Term Goals - 11/27/22 1631       OT SHORT TERM GOAL #1   Title Patient to be independent in home program to decrease scar tissue and edema and increase wrist flexion extension.    Baseline No knowledge of home program.  Wrist extension 35, flat 25 to wrist.    Time 3    Period Weeks    Status New    Target Date 12/18/22               OT Long Term Goals - 11/27/22 1631       OT LONG TERM GOAL #1   Title Right wrist flexion extension increased to within functional limits for patient to push and pull heavy door, twist and turn knobs without increase symptoms    Baseline Right wrist extension 35 degrees, and 25 degrees.  Pain with functional use can increase to 3/10.  With sometimes shooting or sharp pain 7/10.    Time 6    Period Weeks    Status New    Target Date 01/08/23      OT LONG TERM GOAL #2   Title Strength in the right wrist increased to 5/5 for patient to return back to work without increase symptoms  Baseline Patient 5 weeks postop flexion 25, extension 35 degrees.  Pain 3-7/10.   No strengthening yet.    Time 6    Period Weeks    Status New    Target Date 01/08/23      OT LONG TERM GOAL #3   Title Right grip and prehension strength increased to more than 75% compared to the left with increased symptoms to return back to work.    Baseline Not tested patient 5 weeks postop limited flexion extension of the wrist to 25-35 degrees.  In 3- 7/10 at rest    Time 6    Period Weeks    Status New    Target Date 01/08/23                   Plan - 12/03/22 1208     Clinical Impression Statement Patient presented OT evaluation 5 1/2 weeks post right dorsal wrist tenosynovectomy.  Patient had a cyst removed in October 23.  Patient present with decreased right dominant wrist flexion/ extension, increased scar tissue increased pain with some edema in hand/ wrist.  Patient limited in functional use of right dominant hand in ADLs and IADLs as well as to return to work. Pt show increase flexion , ext - pt to focus on close fist to not compensate and irriate long extensors.   Progressing well with exercises, scar massage and home program - done paraffin today and CPM in clinic and pt to use paraffin at home.  Patient can benefit from skilled OT services to increase motion, increase scar tissue and pain increase strength to return back to prior level of function.    OT Occupational Profile and History Problem Focused Assessment - Including review of records relating to presenting problem    Occupational performance deficits (Please refer to evaluation for details): ADL's;IADL's;Work;Social Participation;Leisure;Play    Body Structure / Function / Physical Skills ADL;Scar mobility;UE functional use;Flexibility;IADL;Pain;Strength;Edema;ROM    Rehab Potential Good    Clinical Decision Making Limited treatment options, no task modification necessary    Comorbidities Affecting Occupational Performance: None    Modification or Assistance to Complete Evaluation  No modification of tasks  or assist necessary to complete eval    OT Frequency 2x / week    OT Duration 6 weeks    OT Treatment/Interventions Self-care/ADL training;Moist Heat;Paraffin;Fluidtherapy;Contrast Bath;Passive range of motion;Therapeutic activities;Splinting;Scar mobilization;Manual Therapy;Therapeutic exercise;Patient/family education    Consulted and Agree with Plan of Care Patient             Patient will benefit from skilled therapeutic intervention in order to improve the following deficits and impairments:   Body Structure / Function / Physical Skills: ADL, Scar mobility, UE functional use, Flexibility, IADL, Pain, Strength, Edema, ROM       Visit Diagnosis: Pain in right wrist  Scar condition and fibrosis of skin  Muscle weakness (generalized)  Stiffness of right wrist, not elsewhere classified    Problem List Patient Active Problem List   Diagnosis Date Noted   Status post total hip replacement, left 10/28/2021   Dyslipidemia 08/24/2021   Status post colonoscopy 06/02/2019   Hepatic steatosis 06/02/2019   Barrett's esophagus without dysplasia 01/19/2019   High coronary artery calcium score 12/29/2018   Chronic iron deficiency anemia 12/22/2017   Gastritis without bleeding 06/25/2017   Vitamin B12 deficiency 08/03/2016   OSA on CPAP 04/18/2016   Nephrolithiasis 04/13/2016   Chronic midline low back pain without sciatica 01/25/2016   Primary osteoarthritis involving  multiple joints 11/20/2015   Bilateral carpal tunnel syndrome 09/14/2015   Essential hypertension 09/14/2015   Chronic knee pain 08/30/2014   Erectile dysfunction 08/30/2014   Other allergic rhinitis 08/30/2014   Onychomycosis of toenail 07/24/2014   Hyperlipidemia associated with type 2 diabetes mellitus ([place]) 04/23/2014   Type II diabetes mellitus with complication ([place]) 00/37/9444    Jesse Alvarado, OTR/L,CLT 12/03/2022, 12:12 PM  [facility] [address] [place], [address] Phone: [phone]   Fax:  [phone]  Name: Jesse Alvarado MRN: [id number] Date of Birth: 1966/02/05

## 2022-12-07 ENCOUNTER — Encounter: Payer: Self-pay | Admitting: Surgery

## 2022-12-07 ENCOUNTER — Ambulatory Visit: Payer: Managed Care, Other (non HMO) | Admitting: Occupational Therapy

## 2022-12-07 DIAGNOSIS — L905 Scar conditions and fibrosis of skin: Secondary | ICD-10-CM

## 2022-12-07 DIAGNOSIS — M6281 Muscle weakness (generalized): Secondary | ICD-10-CM

## 2022-12-07 DIAGNOSIS — M25531 Pain in right wrist: Secondary | ICD-10-CM | POA: Diagnosis not present

## 2022-12-07 DIAGNOSIS — M25631 Stiffness of right wrist, not elsewhere classified: Secondary | ICD-10-CM

## 2022-12-07 NOTE — Therapy (Signed)
[facility] [address], [place], [address] Phone: [phone]   Fax:  [phone]  Occupational Therapy Treatment  Patient Details  Name: Jesse Alvarado MRN: [id number] Date of Birth: 09/11/1966 Referring Provider (OT): [place]   Encounter Date: 12/07/2022   OT End of Session - 12/07/22 1429     Visit Number 4    Number of Visits 12    Date for OT Re-Evaluation 01/08/23    OT Start Time 1430    OT Stop Time 1514    OT Time Calculation (min) 44 min    Activity Tolerance Patient tolerated treatment well    Behavior During Therapy [facility] for tasks assessed/performed             Past Medical History:  Diagnosis Date   Anemia    Arthritis    Asthma    sports induced asthma. takes inhalers when needed   Barrett's esophagus without dysplasia    Cancer (HCC)    Basal and squamoous cell   Chronic kidney disease    Complication of anesthesia    high tolerance to pain medication. body absorbs pain med quickly   Coronary artery disease    Cough on exercise    Diabetes mellitus without complication ([place])    Diverticulosis    Erectile dysfunction    Fatty liver    GERD (gastroesophageal reflux disease)    History of kidney stones    Hyperlipidemia    Hypertension    per patient, he does not have high bp but is treated for his kidneys and his diabetes   Pancreatitis    Right shoulder injury    Sleep apnea    use C-PAP    Past Surgical History:  Procedure Laterality Date   ANKLE GANGLION CYST EXCISION Left    x 5   CARPAL TUNNEL RELEASE Right 10/03/2018   Procedure: CARPAL TUNNEL RELEASE;  Surgeon: Earnestine Leys, MD;  Location: [facility];  Service: Orthopedics;  Laterality: Right;   CARPAL TUNNEL RELEASE Left 10/24/2018   Procedure: CARPAL TUNNEL RELEASE;  Surgeon: Earnestine Leys, MD;  Location: [facility];  Service: Orthopedics;  Laterality: Left;   COLONOSCOPY  05/10/2014   COLONOSCOPY WITH PROPOFOL  N/A 08/30/2019   Procedure: COLONOSCOPY WITH PROPOFOL;  Surgeon: Toledo, Benay Pike, MD;  Location: [facility];  Service: Gastroenterology;  Laterality: N/A;   CYSTOSCOPY WITH STENT PLACEMENT Bilateral 06/05/2016   Procedure: CYSTOSCOPY WITH STENT PLACEMENT;  Surgeon: Nickie Retort, MD;  Location: [facility];  Service: Urology;  Laterality: Bilateral;   CYSTOSCOPY/URETEROSCOPY/HOLMIUM LASER/STENT PLACEMENT Left 04/13/2016   Procedure: CYSTOSCOPY/RETROGRADE PYELOGRAM/URETEROSCOPY WITH HOLMIUM LASER LITHOTRIPSY//STENT PLACEMENT;  Surgeon: Festus Aloe, MD;  Location: [facility];  Service: Urology;  Laterality: Left;   ESOPHAGOGASTRODUODENOSCOPY (EGD) WITH PROPOFOL N/A 12/27/2015   Procedure: ESOPHAGOGASTRODUODENOSCOPY (EGD) WITH PROPOFOL;  Surgeon: Manya Silvas, MD;  Location: [facility];  Service: Endoscopy;  Laterality: N/A;   ESOPHAGOGASTRODUODENOSCOPY (EGD) WITH PROPOFOL N/A 08/30/2019   Procedure: ESOPHAGOGASTRODUODENOSCOPY (EGD) WITH PROPOFOL;  Surgeon: Toledo, Benay Pike, MD;  Location: [facility];  Service: Gastroenterology;  Laterality: N/A;   EXTRACORPOREAL SHOCK WAVE LITHOTRIPSY Right 12/30/2017   Procedure: EXTRACORPOREAL SHOCK WAVE LITHOTRIPSY (ESWL);  Surgeon: Royston Cowper, MD;  Location: [facility];  Service: Urology;  Laterality: Right;   EXTRACORPOREAL SHOCK WAVE LITHOTRIPSY Left 03/19/2022   Procedure: EXTRACORPOREAL SHOCK WAVE LITHOTRIPSY (ESWL);  Surgeon: Royston Cowper, MD;  Location: [facility];  Service: Urology;  Laterality:  Left;   EXTRACORPOREAL SHOCK WAVE LITHOTRIPSY Right 08/20/2022   Procedure: EXTRACORPOREAL SHOCK WAVE LITHOTRIPSY (ESWL);  Surgeon: Royston Cowper, MD;  Location: [facility];  Service: Urology;  Laterality: Right;   GANGLION CYST EXCISION Right 08/12/2022   Procedure: EXCISION OF DORSAL CARPAL GANGLION OF RIGHT WRIST;  Surgeon: Corky Mull, MD;  Location: [facility];  Service: Orthopedics;  Laterality: Right;   HERNIA REPAIR  5102    umbilical   KNEE ARTHROSCOPY Right 2015   had bursa sack repaired   KNEE ARTHROSCOPY WITH MEDIAL MENISECTOMY Right 10/11/2020   Procedure: Right knee arthroscopy partial medial meniscectomy;  Surgeon: Leim Fabry, MD;  Location: [place];  Service: Orthopedics;  Laterality: Right;  Diabetic - oral meds sleep apnea   KNEE ARTHROSCOPY WITH MENISCAL REPAIR Right 11/18/2018   Procedure: KNEE ARTHROSCOPY WITH MENISCAL REPAIR AND CHONDROPLASTY;  Surgeon: Leim Fabry, MD;  Location: [place];  Service: Orthopedics;  Laterality: Right;  Diabetic - oral meds sleep apnea SUPINE WITH ACL LEG HOLDER SMITH AND NEWPHEW CETERIX   REPAIR EXTENSOR TENDON Right 10/22/2022   Procedure: EXTENSOR TENOSYNOVECTOMY OF RIGHT WRIST;  Surgeon: Corky Mull, MD;  Location: [facility];  Service: Orthopedics;  Laterality: Right;   SHOULDER SURGERY Right 2011   tendon was shredded and was trimmed, repaired and reattached. Screws in shoulder   TOTAL HIP ARTHROPLASTY Left 10/28/2021   Procedure: TOTAL HIP ARTHROPLASTY;  Surgeon: Corky Mull, MD;  Location: [facility];  Service: Orthopedics;  Laterality: Left;   URETEROSCOPY WITH HOLMIUM LASER LITHOTRIPSY Bilateral 06/05/2016   Procedure: URETEROSCOPY WITH HOLMIUM LASER LITHOTRIPSY;  Surgeon: Nickie Retort, MD;  Location: [facility];  Service: Urology;  Laterality: Bilateral;   VASECTOMY  2002    There were no vitals filed for this visit.   Subjective Assessment - 12/07/22 1428     Subjective  Seen Dr Leslye Peer - next appt in month - doing okay but still some pain on back of hand -pinkie and thumb side    Pertinent History 11/20/22 Ortho note : Jesse Alvarado is a 57 y.o. male who presents today for a repeat skin check status post a revision extensive tenosynovectomy involving the dorsal aspect of the right wrist. The patient is now 4 weeks status post surgery, surgery was performed by Dr. Roland Rack on 10/22/2022. At his initial postop visit the patient  was instructed to continue to wear the Velcro wrist splint routinely except for showers and continue with the Ace wrap applied to the right wrist to minimize the amount of swelling along the dorsal aspect of the right wrist. The patient denies any trauma or injury affecting right wrist since his procedure. He denies any signs of infection at home such as fevers chills or any drainage from the dorsal aspect of the right wrist. The patient has not noticed any significant focal swelling like he did after his initial tenosynovectomy. The patient has been out of work since the surgery. He does still have swelling noted in his fingers but states that this is improving at this time. The patient reports a 2 out of 10 pain score in the right upper extremity at today's visit. He is not taking any medication consistently for the right wrist at this time. REfer to OT    Patient Stated Goals Want my motion and strength back in my R wrist so I can go back to work    Currently in Pain? Yes    Pain Score 2  Pain Location Hand    Pain Orientation Right    Pain Descriptors / Indicators Tender;Sharp;Shooting;Tingling    Pain Type Surgical pain                [facility] OT Assessment - 12/07/22 0001       AROM   Right Wrist Extension 60 Degrees   loose fist; 40 open hand pain dorsal hand   Right Wrist Flexion 35 Degrees   50 in session loose fist                     OT Treatments/Exercises (OP) - 12/07/22 0001       RUE Paraffin   Number Minutes Paraffin 8 Minutes    RUE Paraffin Location Wrist    Comments 2 min flexion/ext 2 min each x 2            Change  last time for pt to use his paraffin bath at home.  With a prolonged 2 minutes flexion extension stretch. Done some scar massage as well as sweeping using [place] #2 over volar and dorsal wrist and forearm. Gentle joint mobs and traction done for wrist pain-free.  Patient to focus on close fist wrist flexion extension stretches -  great progress without CPM this date  Pt tender over dorsal hand -ulnar to scar - appear to be some nerve pain - increase to touch - report pins and needles /tingling - and increase pain with wrist ext using longer extensors    Patient to make sure he does not compensate with long extensors with wrist extension. Add and done table slides pain free 20 reps   Add 16 oz hammer for pt for wrist and forearm in all planes - 15 reps  Pain free  Great progress in AROM  2 x day after his motion         OT Education - 12/07/22 1428     Education Details progress and changes  HEP    Person(s) Educated Patient    Methods Explanation;Demonstration;Tactile cues;Verbal cues;Handout    Comprehension Verbalized understanding;Returned demonstration;Verbal cues required              OT Short Term Goals - 11/27/22 1631       OT SHORT TERM GOAL #1   Title Patient to be independent in home program to decrease scar tissue and edema and increase wrist flexion extension.    Baseline No knowledge of home program.  Wrist extension 35, flat 25 to wrist.    Time 3    Period Weeks    Status New    Target Date 12/18/22               OT Long Term Goals - 11/27/22 1631       OT LONG TERM GOAL #1   Title Right wrist flexion extension increased to within functional limits for patient to push and pull heavy door, twist and turn knobs without increase symptoms    Baseline Right wrist extension 35 degrees, and 25 degrees.  Pain with functional use can increase to 3/10.  With sometimes shooting or sharp pain 7/10.    Time 6    Period Weeks    Status New    Target Date 01/08/23      OT LONG TERM GOAL #2   Title Strength in the right wrist increased to 5/5 for patient to return back to work without increase symptoms    Baseline Patient 5 weeks  postop flexion 25, extension 35 degrees.  Pain 3-7/10.  No strengthening yet.    Time 6    Period Weeks    Status New    Target Date 01/08/23      OT  LONG TERM GOAL #3   Title Right grip and prehension strength increased to more than 75% compared to the left with increased symptoms to return back to work.    Baseline Not tested patient 5 weeks postop limited flexion extension of the wrist to 25-35 degrees.  In 3- 7/10 at rest    Time 6    Period Weeks    Status New    Target Date 01/08/23                   Plan - 12/07/22 1429     Clinical Impression Statement Patient presented OT evaluation 6 weeks post right dorsal wrist tenosynovectomy.  Patient had a cyst removed in October 23.  Patient present with decreased right dominant wrist flexion/ extension, increased scar tissue increased pain with some edema in hand/ wrist.  Patient limited in functional use of right dominant hand in ADLs and IADLs as well as to return to work. Pt cont to show increase flexion , ext every  session -appear to have some nerve pain over sensory ulnar branch - pt to focus on loose fist flexion ,ext AROM and add 1 lbs weight for all planes - only open hand extention of wrist to be done is table slides - pt to cont using paraffin at home with stretches. Patient can benefit from skilled OT services to increase motion, increase scar tissue and pain increase strength to return back to prior level of function.    OT Occupational Profile and History Problem Focused Assessment - Including review of records relating to presenting problem    Occupational performance deficits (Please refer to evaluation for details): ADL's;IADL's;Work;Social Participation;Leisure;Play    Body Structure / Function / Physical Skills ADL;Scar mobility;UE functional use;Flexibility;IADL;Pain;Strength;Edema;ROM    Rehab Potential Good    Clinical Decision Making Limited treatment options, no task modification necessary    Comorbidities Affecting Occupational Performance: None    Modification or Assistance to Complete Evaluation  No modification of tasks or assist necessary to complete eval     OT Frequency 2x / week    OT Duration 6 weeks    OT Treatment/Interventions Self-care/ADL training;Moist Heat;Paraffin;Fluidtherapy;Contrast Bath;Passive range of motion;Therapeutic activities;Splinting;Scar mobilization;Manual Therapy;Therapeutic exercise;Patient/family education    Consulted and Agree with Plan of Care Patient             Patient will benefit from skilled therapeutic intervention in order to improve the following deficits and impairments:   Body Structure / Function / Physical Skills: ADL, Scar mobility, UE functional use, Flexibility, IADL, Pain, Strength, Edema, ROM       Visit Diagnosis: Pain in right wrist  Scar condition and fibrosis of skin  Muscle weakness (generalized)  Stiffness of right wrist, not elsewhere classified    Problem List Patient Active Problem List   Diagnosis Date Noted   Status post total hip replacement, left 10/28/2021   Dyslipidemia 08/24/2021   Status post colonoscopy 06/02/2019   Hepatic steatosis 06/02/2019   Barrett's esophagus without dysplasia 01/19/2019   High coronary artery calcium score 12/29/2018   Chronic iron deficiency anemia 12/22/2017   Gastritis without bleeding 06/25/2017   Vitamin B12 deficiency 08/03/2016   OSA on CPAP 04/18/2016   Nephrolithiasis 04/13/2016   Chronic midline low  back pain without sciatica 01/25/2016   Primary osteoarthritis involving multiple joints 11/20/2015   Bilateral carpal tunnel syndrome 09/14/2015   Essential hypertension 09/14/2015   Chronic knee pain 08/30/2014   Erectile dysfunction 08/30/2014   Other allergic rhinitis 08/30/2014   Onychomycosis of toenail 07/24/2014   Hyperlipidemia associated with type 2 diabetes mellitus ([place]) 04/23/2014   Type II diabetes mellitus with complication ([place]) 62/86/3817    Rosalyn Gess, OTR/L,CLT 12/07/2022, 4:40 PM  [facility] [address], [place],  [address] Phone: [phone]   Fax:  [phone]  Name: Jesse Alvarado MRN: [id number] Date of Birth: 1966/05/21

## 2022-12-10 ENCOUNTER — Ambulatory Visit: Payer: Managed Care, Other (non HMO) | Attending: Student | Admitting: Occupational Therapy

## 2022-12-10 DIAGNOSIS — L905 Scar conditions and fibrosis of skin: Secondary | ICD-10-CM

## 2022-12-10 DIAGNOSIS — M6281 Muscle weakness (generalized): Secondary | ICD-10-CM

## 2022-12-10 DIAGNOSIS — M25531 Pain in right wrist: Secondary | ICD-10-CM | POA: Diagnosis present

## 2022-12-10 DIAGNOSIS — M25631 Stiffness of right wrist, not elsewhere classified: Secondary | ICD-10-CM | POA: Diagnosis present

## 2022-12-10 NOTE — Therapy (Signed)
[facility] [address] [place], [address] Phone: [phone]   Fax:  [phone]  Occupational Therapy Treatment  Patient Details  Name: Jesse Alvarado MRN: [id number] Date of Birth: 1966-10-26 Referring Provider (OT): [place]   Encounter Date: 12/10/2022   OT End of Session - 12/10/22 0909     Visit Number 5    Number of Visits 12    Date for OT Re-Evaluation 01/08/23    OT Start Time 0907    OT Stop Time 0945    OT Time Calculation (min) 38 min    Activity Tolerance Patient tolerated treatment well    Behavior During Therapy [facility] for tasks assessed/performed             Past Medical History:  Diagnosis Date   Anemia    Arthritis    Asthma    sports induced asthma. takes inhalers when needed   Barrett's esophagus without dysplasia    Cancer (HCC)    Basal and squamoous cell   Chronic kidney disease    Complication of anesthesia    high tolerance to pain medication. body absorbs pain med quickly   Coronary artery disease    Cough on exercise    Diabetes mellitus without complication ([place])    Diverticulosis    Erectile dysfunction    Fatty liver    GERD (gastroesophageal reflux disease)    History of kidney stones    Hyperlipidemia    Hypertension    per patient, he does not have high bp but is treated for his kidneys and his diabetes   Pancreatitis    Right shoulder injury    Sleep apnea    use C-PAP    Past Surgical History:  Procedure Laterality Date   ANKLE GANGLION CYST EXCISION Left    x 5   CARPAL TUNNEL RELEASE Right 10/03/2018   Procedure: CARPAL TUNNEL RELEASE;  Surgeon: Earnestine Leys, MD;  Location: [facility];  Service: Orthopedics;  Laterality: Right;   CARPAL TUNNEL RELEASE Left 10/24/2018   Procedure: CARPAL TUNNEL RELEASE;  Surgeon: Earnestine Leys, MD;  Location: [facility];  Service: Orthopedics;  Laterality: Left;   COLONOSCOPY  05/10/2014   COLONOSCOPY WITH PROPOFOL  N/A 08/30/2019   Procedure: COLONOSCOPY WITH PROPOFOL;  Surgeon: Toledo, Benay Pike, MD;  Location: [facility];  Service: Gastroenterology;  Laterality: N/A;   CYSTOSCOPY WITH STENT PLACEMENT Bilateral 06/05/2016   Procedure: CYSTOSCOPY WITH STENT PLACEMENT;  Surgeon: Nickie Retort, MD;  Location: [facility];  Service: Urology;  Laterality: Bilateral;   CYSTOSCOPY/URETEROSCOPY/HOLMIUM LASER/STENT PLACEMENT Left 04/13/2016   Procedure: CYSTOSCOPY/RETROGRADE PYELOGRAM/URETEROSCOPY WITH HOLMIUM LASER LITHOTRIPSY//STENT PLACEMENT;  Surgeon: Festus Aloe, MD;  Location: [facility];  Service: Urology;  Laterality: Left;   ESOPHAGOGASTRODUODENOSCOPY (EGD) WITH PROPOFOL N/A 12/27/2015   Procedure: ESOPHAGOGASTRODUODENOSCOPY (EGD) WITH PROPOFOL;  Surgeon: Manya Silvas, MD;  Location: [facility];  Service: Endoscopy;  Laterality: N/A;   ESOPHAGOGASTRODUODENOSCOPY (EGD) WITH PROPOFOL N/A 08/30/2019   Procedure: ESOPHAGOGASTRODUODENOSCOPY (EGD) WITH PROPOFOL;  Surgeon: Toledo, Benay Pike, MD;  Location: [facility];  Service: Gastroenterology;  Laterality: N/A;   EXTRACORPOREAL SHOCK WAVE LITHOTRIPSY Right 12/30/2017   Procedure: EXTRACORPOREAL SHOCK WAVE LITHOTRIPSY (ESWL);  Surgeon: Royston Cowper, MD;  Location: [facility];  Service: Urology;  Laterality: Right;   EXTRACORPOREAL SHOCK WAVE LITHOTRIPSY Left 03/19/2022   Procedure: EXTRACORPOREAL SHOCK WAVE LITHOTRIPSY (ESWL);  Surgeon: Royston Cowper, MD;  Location: [facility];  Service: Urology;  Laterality:  Left;   EXTRACORPOREAL SHOCK WAVE LITHOTRIPSY Right 08/20/2022   Procedure: EXTRACORPOREAL SHOCK WAVE LITHOTRIPSY (ESWL);  Surgeon: Royston Cowper, MD;  Location: [facility];  Service: Urology;  Laterality: Right;   GANGLION CYST EXCISION Right 08/12/2022   Procedure: EXCISION OF DORSAL CARPAL GANGLION OF RIGHT WRIST;  Surgeon: Corky Mull, MD;  Location: [facility];  Service: Orthopedics;  Laterality: Right;   HERNIA REPAIR  9983    umbilical   KNEE ARTHROSCOPY Right 2015   had bursa sack repaired   KNEE ARTHROSCOPY WITH MEDIAL MENISECTOMY Right 10/11/2020   Procedure: Right knee arthroscopy partial medial meniscectomy;  Surgeon: Leim Fabry, MD;  Location: [place];  Service: Orthopedics;  Laterality: Right;  Diabetic - oral meds sleep apnea   KNEE ARTHROSCOPY WITH MENISCAL REPAIR Right 11/18/2018   Procedure: KNEE ARTHROSCOPY WITH MENISCAL REPAIR AND CHONDROPLASTY;  Surgeon: Leim Fabry, MD;  Location: [place];  Service: Orthopedics;  Laterality: Right;  Diabetic - oral meds sleep apnea SUPINE WITH ACL LEG HOLDER SMITH AND NEWPHEW CETERIX   REPAIR EXTENSOR TENDON Right 10/22/2022   Procedure: EXTENSOR TENOSYNOVECTOMY OF RIGHT WRIST;  Surgeon: Corky Mull, MD;  Location: [facility];  Service: Orthopedics;  Laterality: Right;   SHOULDER SURGERY Right 2011   tendon was shredded and was trimmed, repaired and reattached. Screws in shoulder   TOTAL HIP ARTHROPLASTY Left 10/28/2021   Procedure: TOTAL HIP ARTHROPLASTY;  Surgeon: Corky Mull, MD;  Location: [facility];  Service: Orthopedics;  Laterality: Left;   URETEROSCOPY WITH HOLMIUM LASER LITHOTRIPSY Bilateral 06/05/2016   Procedure: URETEROSCOPY WITH HOLMIUM LASER LITHOTRIPSY;  Surgeon: Nickie Retort, MD;  Location: [facility];  Service: Urology;  Laterality: Bilateral;   VASECTOMY  2002    There were no vitals filed for this visit.   Subjective Assessment - 12/10/22 0908     Subjective  My wrist get so stiff over night -still have that pain over the back of the hand    Pertinent History 11/20/22 Ortho note : Jesse Alvarado is a 57 y.o. male who presents today for a repeat skin check status post a revision extensive tenosynovectomy involving the dorsal aspect of the right wrist. The patient is now 4 weeks status post surgery, surgery was performed by Dr. Roland Alvarado on 10/22/2022. At his initial postop visit the patient was instructed to continue  to wear the Velcro wrist splint routinely except for showers and continue with the Ace wrap applied to the right wrist to minimize the amount of swelling along the dorsal aspect of the right wrist. The patient denies any trauma or injury affecting right wrist since his procedure. He denies any signs of infection at home such as fevers chills or any drainage from the dorsal aspect of the right wrist. The patient has not noticed any significant focal swelling like he did after his initial tenosynovectomy. The patient has been out of work since the surgery. He does still have swelling noted in his fingers but states that this is improving at this time. The patient reports a 2 out of 10 pain score in the right upper extremity at today's visit. He is not taking any medication consistently for the right wrist at this time. REfer to OT    Patient Stated Goals Want my motion and strength back in my R wrist so I can go back to work    Pain Orientation Right    Pain Descriptors / Indicators Tightness    Pain Type  Surgical pain                [facility] OT Assessment - 12/10/22 0001       AROM   Right Wrist Extension 60 Degrees   65-70 in session - loose fist   Right Wrist Flexion 40 Degrees   60-70 in session loose fist                     OT Treatments/Exercises (OP) - 12/10/22 0001       RUE Paraffin   Number Minutes Paraffin 8 Minutes    RUE Paraffin Location Wrist    Comments 2 flexion, ext stretch 80mn eachx 2            Pt making good progress the last 2 session with use of Paraffin and at home - with a prolonged 2 minutes flexion extension stretch. Done some scar massage as well as sweeping using [place] #2 over volar and dorsal wrist and forearm. Gentle joint mobs and traction done for wrist pain-free.   Patient to focus on close fist wrist flexion extension stretches - great progress after paraffin Pt tender over dorsal hand -ulnar to scar - appear to be some nerve pain  - increase to touch - report pins and needles /tingling - and increase pain with wrist ext using longer extensors    Patient to make sure he does not compensate with long extensors with wrist extension. Done and pt to cont with table slides for wrist extention pain free 20 reps    Review and done 16 oz hammer  wrist and forearm in all planes - 15 reps  2 sets - can do 2 sets and increase to 3rd set over the weekend if pain free  Great progress in AROM  2 x day after his motion     Pt wearing hard prefab only with heavy act- and Benik neoprene during day -can wean out of it during day with light act Encourage pt to start doing more tasks around the house - light and pain less than 2/10               OT Education - 12/10/22 0909     Education Details progress and changes  HEP    Person(s) Educated Patient    Methods Explanation;Demonstration;Tactile cues;Verbal cues;Handout    Comprehension Verbalized understanding;Returned demonstration;Verbal cues required              OT Short Term Goals - 11/27/22 1631       OT SHORT TERM GOAL #1   Title Patient to be independent in home program to decrease scar tissue and edema and increase wrist flexion extension.    Baseline No knowledge of home program.  Wrist extension 35, flat 25 to wrist.    Time 3    Period Weeks    Status New    Target Date 12/18/22               OT Long Term Goals - 11/27/22 1631       OT LONG TERM GOAL #1   Title Right wrist flexion extension increased to within functional limits for patient to push and pull heavy door, twist and turn knobs without increase symptoms    Baseline Right wrist extension 35 degrees, and 25 degrees.  Pain with functional use can increase to 3/10.  With sometimes shooting or sharp pain 7/10.    Time 6    Period Weeks  Status New    Target Date 01/08/23      OT LONG TERM GOAL #2   Title Strength in the right wrist increased to 5/5 for patient to return back to  work without increase symptoms    Baseline Patient 5 weeks postop flexion 25, extension 35 degrees.  Pain 3-7/10.  No strengthening yet.    Time 6    Period Weeks    Status New    Target Date 01/08/23      OT LONG TERM GOAL #3   Title Right grip and prehension strength increased to more than 75% compared to the left with increased symptoms to return back to work.    Baseline Not tested patient 5 weeks postop limited flexion extension of the wrist to 25-35 degrees.  In 3- 7/10 at rest    Time 6    Period Weeks    Status New    Target Date 01/08/23                   Plan - 12/10/22 0916     Clinical Impression Statement Patient  post right dorsal wrist tenosynovectomy 10/22/22.  Patient had a cyst removed in October 23.  Patient present at eval with decreased right dominant wrist flexion/ extension, increased scar tissue increased pain with some edema in hand/ wrist.  Patient limited in functional use of right dominant hand in ADLs and IADLs as well as to return to work. Pt making great progress in edema and pain -as well as increase flexion , ext of wrist every  session -appear to have some nerve pain over sensory ulnar branch  on dorsal hand- pt to focus on loose fist flexion ,ext AROM and increase to 2-3 sets with1 lbs weight for wrist all planes. Pt to cont using paraffin at home with stretches. Patient can benefit from skilled OT services to increase motion, increase scar tissue and pain increase strength to return back to prior level of function.    OT Occupational Profile and History Problem Focused Assessment - Including review of records relating to presenting problem    Occupational performance deficits (Please refer to evaluation for details): ADL's;IADL's;Work;Social Participation;Leisure;Play    Body Structure / Function / Physical Skills ADL;Scar mobility;UE functional use;Flexibility;IADL;Pain;Strength;Edema;ROM    Rehab Potential Good    Clinical Decision Making Limited  treatment options, no task modification necessary    Comorbidities Affecting Occupational Performance: None    Modification or Assistance to Complete Evaluation  No modification of tasks or assist necessary to complete eval    OT Frequency 2x / week    OT Duration 6 weeks    OT Treatment/Interventions Self-care/ADL training;Moist Heat;Paraffin;Fluidtherapy;Contrast Bath;Passive range of motion;Therapeutic activities;Splinting;Scar mobilization;Manual Therapy;Therapeutic exercise;Patient/family education    Consulted and Agree with Plan of Care Patient             Patient will benefit from skilled therapeutic intervention in order to improve the following deficits and impairments:   Body Structure / Function / Physical Skills: ADL, Scar mobility, UE functional use, Flexibility, IADL, Pain, Strength, Edema, ROM       Visit Diagnosis: Pain in right wrist  Scar condition and fibrosis of skin  Muscle weakness (generalized)  Stiffness of right wrist, not elsewhere classified    Problem List Patient Active Problem List   Diagnosis Date Noted   Status post total hip replacement, left 10/28/2021   Dyslipidemia 08/24/2021   Status post colonoscopy 06/02/2019   Hepatic steatosis 06/02/2019  Barrett's esophagus without dysplasia 01/19/2019   High coronary artery calcium score 12/29/2018   Chronic iron deficiency anemia 12/22/2017   Gastritis without bleeding 06/25/2017   Vitamin B12 deficiency 08/03/2016   OSA on CPAP 04/18/2016   Nephrolithiasis 04/13/2016   Chronic midline low back pain without sciatica 01/25/2016   Primary osteoarthritis involving multiple joints 11/20/2015   Bilateral carpal tunnel syndrome 09/14/2015   Essential hypertension 09/14/2015   Chronic knee pain 08/30/2014   Erectile dysfunction 08/30/2014   Other allergic rhinitis 08/30/2014   Onychomycosis of toenail 07/24/2014   Hyperlipidemia associated with type 2 diabetes mellitus ([place]) 04/23/2014    Type II diabetes mellitus with complication ([place]) 25/91/0289    Rosalyn Gess, OTR/L,CLT 12/10/2022, 10:00 AM  [facility] [address] [place], [address] Phone: [phone]   Fax:  [phone]  Name: HAMISH BANKS MRN: [id number] Date of Birth: 1966-04-09

## 2022-12-12 ENCOUNTER — Encounter: Payer: Self-pay | Admitting: Emergency Medicine

## 2022-12-12 ENCOUNTER — Other Ambulatory Visit: Payer: Self-pay

## 2022-12-12 ENCOUNTER — Emergency Department
Admission: EM | Admit: 2022-12-12 | Discharge: 2022-12-12 | Disposition: A | Discharge status: Home / Self Care | Payer: Managed Care, Other (non HMO) | Attending: Emergency Medicine | Admitting: Emergency Medicine

## 2022-12-12 DIAGNOSIS — N189 Chronic kidney disease, unspecified: Secondary | ICD-10-CM | POA: Insufficient documentation

## 2022-12-12 DIAGNOSIS — K625 Hemorrhage of anus and rectum: Secondary | ICD-10-CM | POA: Insufficient documentation

## 2022-12-12 DIAGNOSIS — E119 Type 2 diabetes mellitus without complications: Secondary | ICD-10-CM | POA: Diagnosis not present

## 2022-12-12 LAB — BASIC METABOLIC PANEL
Anion gap: 7 (ref 5–15)
BUN: 20 mg/dL (ref 6–20)
CO2: 18 mmol/L — ABNORMAL LOW (ref 22–32)
Calcium: 8.1 mg/dL — ABNORMAL LOW (ref 8.9–10.3)
Chloride: 112 mmol/L — ABNORMAL HIGH (ref 98–111)
Creatinine, Ser: 1.45 mg/dL — ABNORMAL HIGH (ref 0.61–1.24)
GFR, Estimated: 56 mL/min — ABNORMAL LOW (ref >60)
Glucose, Bld: 195 mg/dL — ABNORMAL HIGH (ref 70–99)
Potassium: 4 mmol/L (ref 3.5–5.1)
Sodium: 137 mmol/L (ref 135–145)

## 2022-12-12 LAB — COMPREHENSIVE METABOLIC PANEL
ALT: 17 U/L (ref 0–44)
AST: 27 U/L (ref 15–41)
Albumin: 4.3 g/dL (ref 3.5–5.0)
Alkaline Phosphatase: 33 U/L — ABNORMAL LOW (ref 38–126)
Anion gap: 11 (ref 5–15)
BUN: 22 mg/dL — ABNORMAL HIGH (ref 6–20)
CO2: 20 mmol/L — ABNORMAL LOW (ref 22–32)
Calcium: 9.2 mg/dL (ref 8.9–10.3)
Chloride: 104 mmol/L (ref 98–111)
Creatinine, Ser: 1.39 mg/dL — ABNORMAL HIGH (ref 0.61–1.24)
GFR, Estimated: 59 mL/min — ABNORMAL LOW (ref >60)
Glucose, Bld: 162 mg/dL — ABNORMAL HIGH (ref 70–99)
Potassium: 3.9 mmol/L (ref 3.5–5.1)
Sodium: 135 mmol/L (ref 135–145)
Total Bilirubin: 0.8 mg/dL (ref 0.3–1.2)
Total Protein: 7.3 g/dL (ref 6.5–8.1)

## 2022-12-12 LAB — TYPE AND SCREEN
ABO/RH(D): O POS
Antibody Screen: NEGATIVE

## 2022-12-12 LAB — CBC
HCT: 34 % — ABNORMAL LOW (ref 39.0–52.0)
HCT: 31 % — ABNORMAL LOW (ref 39.0–52.0)
Hemoglobin: 11.1 g/dL — ABNORMAL LOW (ref 13.0–17.0)
Hemoglobin: 10.1 g/dL — ABNORMAL LOW (ref 13.0–17.0)
MCH: 29.3 pg (ref 26.0–34.0)
MCH: 29.6 pg (ref 26.0–34.0)
MCHC: 32.6 g/dL (ref 30.0–36.0)
MCHC: 32.6 g/dL (ref 30.0–36.0)
MCV: 89.7 fL (ref 80.0–100.0)
MCV: 90.9 fL (ref 80.0–100.0)
Platelets: 353 10*3/uL (ref 150–400)
Platelets: 312 10*3/uL (ref 150–400)
RBC: 3.79 MIL/uL — ABNORMAL LOW (ref 4.22–5.81)
RBC: 3.41 MIL/uL — ABNORMAL LOW (ref 4.22–5.81)
RDW: 14.8 % (ref 11.5–15.5)
RDW: 14.8 % (ref 11.5–15.5)
WBC: 11.3 10*3/uL — ABNORMAL HIGH (ref 4.0–10.5)
WBC: 6.8 10*3/uL (ref 4.0–10.5)
nRBC: 0 % (ref 0.0–0.2)
nRBC: 0 % (ref 0.0–0.2)

## 2022-12-12 MED ORDER — PANTOPRAZOLE SODIUM 40 MG PO TBEC
40.0000 mg | DELAYED_RELEASE_TABLET | Freq: Every day | ORAL | Status: DC
  Administered 2022-12-12: 40 mg via ORAL
  Filled 2022-12-12: qty 1

## 2022-12-12 MED ORDER — SODIUM CHLORIDE 0.9 % IV BOLUS
500.0000 mL | Freq: Once | INTRAVENOUS | Status: AC
  Administered 2022-12-12: 500 mL via INTRAVENOUS

## 2022-12-12 NOTE — Discharge Instructions (Addendum)
If you are not already taking an acid blocking medication, I would suggest that you initiate use of an over-the-counter antacid such as Prilosec or omeprazole as directed on packaging.  Please follow-up closely with her GI physician Dr. Virgina Jock or Dr. Alice Reichert.  Return to the ER right away if you develop abdominal pain, fever, recurrent bleeding, weakness, or other new concerns or symptoms arise.  Do not take any NSAID medications such as ibuprofen or naproxen

## 2022-12-12 NOTE — ED Provider Notes (Signed)
Patient has been stable, vital signs stable, no further bloody bowel movements in the emergency department.  Recheck labs significant for a hemoglobin of 10.1 from 11.1, got 500 cc of crystalloid IV bolus in between blood testing, this drop may indicate a mix of dilutional versus blood loss.  I communicated these findings with the patient and his wife and gave them the option of staying in the hospital for further observation, repeat blood testing, monitoring versus discharging home and self monitoring at home and following up with GI.  They opted for the latter and understand to return to the emergency department if any new or worsening symptoms, further rectal bleeding, symptoms of blood loss.  They will follow-up with their GI and already have repeat blood testing for early next week as scheduled.   Lucillie Garfinkel, MD 12/12/22 Bosie Helper

## 2022-12-12 NOTE — ED Notes (Signed)
Pt verbalized understanding of discharge instructions.

## 2022-12-12 NOTE — ED Triage Notes (Signed)
Patient to ED for rectal bleeding. Patient states on episode a few days ago and then again today. Felt dizzy yesterday when bending over. States blood in stool looks Marg[place]ta Windish in color.

## 2022-12-12 NOTE — ED Provider Notes (Signed)
[facility] [address] Provider Note    Event Date/Time   First MD Initiated Contact with Patient 12/12/22 1553     (approximate)   History   Rectal Bleeding   HPI  Jesse Alvarado is a 57 y.o. male   history of coronary disease chronic kidney disease arthritis anemia diverticulosis and internal hemorrhoi, diabetes.   Patient reports yesterday he noticed after bowel movement he had a streak of red blood on his stool.  The stool otherwise look normal.  Today he had a sudden urge to go to the bathroom, and he reports when he went to the bathroom he noticed sort of mixed blood in the toilet bowl as well as dark stool or clots.  He reports that it "filled the toilet" with bloody color.  He has not had any pain with that there is no nausea or vomiting.  He felt a bit fatigued for the last day or 2.  No chest pain or shortness of breath.  He takes no blood thinners does not take any medications like aspirin or otherwise.  No associated stomach pain.  Right now he feels okay, only had 1 bowel movement today that was concerning.  He came to the ER right after.  Does report a history of hemorrhoids in the past that have caused bleeding and he seen Dr. Alice Reichert       Physical Exam   Triage Vital Signs: ED Triage Vitals [12/12/22 1251]  Enc Vitals Group     BP (!) 130/95     Pulse Rate (!) 129     Resp 18     Temp 98.6 F (37 C)     Temp Source Oral     SpO2 99 %     Weight      Height      Head Circumference      Peak Flow      Pain Score 0     Pain Loc      Pain Edu?      Excl. in [place]?    Vitals:   12/12/22 1430 12/12/22 1500  BP: 126/83 118/81  Pulse: 96 88  Resp:    Temp:    SpO2: 94% 95%     Most recent vital signs: Vitals:   12/12/22 1430 12/12/22 1500  BP: 126/83 118/81  Pulse: 96 88  Resp:    Temp:    SpO2: 94% 95%     General: Awake, no distress.  Heart rate approximately 105.  In no distress.  Does not appear to be in any pain or  discomfort. CV:  Good peripheral perfusion.  Mild tachycardia Resp:  Normal effort.   Abd:  No distention.  Soft nontender nondistended through all quadrants.  No pain noted in any area.  Denies abdominal pain.  No peritonitis no guarding Rectal exam escorted by nurse Lattie Haw.  No external hemorrhoids.  Rectal exam nontender, he has what appears to be small stringy amount of venous clot.  No active red bleeding or obvious hemorrhage.  Stool is not present in the vault. Other:     ED Results / Procedures / Treatments   Labs (all labs ordered are listed, but only abnormal results are displayed) Labs Reviewed  COMPREHENSIVE METABOLIC PANEL - Abnormal; Notable for the following components:      Result Value   CO2 20 (*)    Glucose, Bld 162 (*)    BUN 22 (*)    Creatinine, Ser 1.39 (*)  Alkaline Phosphatase 33 (*)    GFR, Estimated 59 (*)    All other components within normal limits  CBC - Abnormal; Notable for the following components:   WBC 11.3 (*)    RBC 3.79 (*)    Hemoglobin 11.1 (*)    HCT 34.0 (*)    All other components within normal limits  CBC  BASIC METABOLIC PANEL  POC OCCULT BLOOD, ED  TYPE AND SCREEN     EKG  And interpreted by me at 1300 heart rate 128 QRS 90 QTc 450 Sinus tachycardia   RADIOLOGY  No noted indication for imaging.  Patient no associated abdominal pain.  No painful discomfort.  Does not have evidence to support major bleeding noted at this time that would be amenable to GI bleeding study   PROCEDURES:  Critical Care performed: No  Procedures   MEDICATIONS ORDERED IN ED: Medications  pantoprazole (PROTONIX) EC tablet 40 mg (40 mg Oral Given 12/12/22 1449)  sodium chloride 0.9 % bolus 500 mL (500 mLs Intravenous New Bag/Given 12/12/22 1502)     IMPRESSION / MDM / ASSESSMENT AND PLAN / ED COURSE  I reviewed the triage vital signs and the nursing notes.                              Differential diagnosis includes, but is not limited  to, plain less rectal bleeding or GI bleeding.  No clinical signs or symptoms of abdominal pain nausea or vomiting.  He had previous colonoscopy demonstrating hemorrhoids and small diverticuli.  At this point appears to have painless bleeding likely of a rectal source.  Based on his examination I am very suspicious this may be from hemorrhoidal type cause especially given that yesterday noticed small streak of red on his stool.  Now which seem more of a venous type picture.  He is hemodynamically stable he was initially tachycardic but he also reported he felt nervous and his heart rate has improved.  He appears to have a heart rate of about 100 and his last couple clinic visits as well outpatient.  No symptoms of upper GI bleeding  In no associated distress.  Labs show mild leukocytosis chronic anemia without evidence of acute drop.  Mild chronic renal disease.  I discussed this case and presentation with Dr. Virgina Jock, GI.  Based on the history and the reassuring assessment at this time would seem reasonable to observe the patient and recheck a hemoglobin at approximately 5 PM.  Ongoing care assigned to my partner Dr. Jacelyn Grip, if patient remains with stable hemodynamics no evidence of ongoing bleeding, reassuring stable hemoglobin, and appears appropriate on reassessment I think he would be followed up as an outpatient and go home with careful return precautions related to rectal bleeding.  Patient's presentation is most consistent with acute complicated illness / injury requiring diagnostic workup.   The patient is on the cardiac monitor to evaluate for evidence of arrhythmia and/or significant heart rate changes.      FINAL CLINICAL IMPRESSION(S) / ED DIAGNOSES   Final diagnoses:  Rectal bleeding     Rx / DC Orders   ED Discharge Orders     None        Note:  This document was prepared using Dragon voice recognition software and may include unintentional dictation errors.   Delman Kitten,  MD 12/12/22 [phone]

## 2022-12-14 ENCOUNTER — Ambulatory Visit: Payer: Managed Care, Other (non HMO) | Admitting: Occupational Therapy

## 2022-12-14 DIAGNOSIS — M25631 Stiffness of right wrist, not elsewhere classified: Secondary | ICD-10-CM

## 2022-12-14 DIAGNOSIS — M25531 Pain in right wrist: Secondary | ICD-10-CM

## 2022-12-14 DIAGNOSIS — L905 Scar conditions and fibrosis of skin: Secondary | ICD-10-CM

## 2022-12-14 DIAGNOSIS — M6281 Muscle weakness (generalized): Secondary | ICD-10-CM

## 2022-12-14 NOTE — Therapy (Signed)
[facility] [address] [place], [address] Phone: [phone]   Fax:  [phone]  Occupational Therapy Treatment  Patient Details  Name: Jesse Alvarado MRN: [id number] Date of Birth: [birth year] Referring Provider (OT): Hayden Lake PA   Encounter Date: 12/14/2022   OT End of Session - 12/14/22 0832     Visit Number 6    Number of Visits 12    Date for OT Re-Evaluation 01/08/23    OT Start Time 0819    OT Stop Time 0857    OT Time Calculation (min) 38 min    Activity Tolerance Patient tolerated treatment well    Behavior During Therapy [facility] for tasks assessed/performed             Past Medical History:  Diagnosis Date   Anemia    Arthritis    Asthma    sports induced asthma. takes inhalers when needed   Barrett's esophagus without dysplasia    Cancer (HCC)    Basal and squamoous cell   Chronic kidney disease    Complication of anesthesia    high tolerance to pain medication. body absorbs pain med quickly   Coronary artery disease    Cough on exercise    Diabetes mellitus without complication ([place])    Diverticulosis    Erectile dysfunction    Fatty liver    GERD (gastroesophageal reflux disease)    History of kidney stones    Hyperlipidemia    Hypertension    per patient, he does not have high bp but is treated for his kidneys and his diabetes   Pancreatitis    Right shoulder injury    Sleep apnea    use C-PAP    Past Surgical History:  Procedure Laterality Date   ANKLE GANGLION CYST EXCISION Left    x 5   CARPAL TUNNEL RELEASE Right 10/03/2018   Procedure: CARPAL TUNNEL RELEASE;  Surgeon: Earnestine Leys, MD;  Location: [facility];  Service: Orthopedics;  Laterality: Right;   CARPAL TUNNEL RELEASE Left 10/24/2018   Procedure: CARPAL TUNNEL RELEASE;  Surgeon: Earnestine Leys, MD;  Location: [facility];  Service: Orthopedics;  Laterality: Left;   COLONOSCOPY  05/10/2014   COLONOSCOPY WITH PROPOFOL  N/A 08/30/2019   Procedure: COLONOSCOPY WITH PROPOFOL;  Surgeon: Toledo, Benay Pike, MD;  Location: [facility];  Service: Gastroenterology;  Laterality: N/A;   CYSTOSCOPY WITH STENT PLACEMENT Bilateral 06/05/2016   Procedure: CYSTOSCOPY WITH STENT PLACEMENT;  Surgeon: Nickie Retort, MD;  Location: [facility];  Service: Urology;  Laterality: Bilateral;   CYSTOSCOPY/URETEROSCOPY/HOLMIUM LASER/STENT PLACEMENT Left 04/13/2016   Procedure: CYSTOSCOPY/RETROGRADE PYELOGRAM/URETEROSCOPY WITH HOLMIUM LASER LITHOTRIPSY//STENT PLACEMENT;  Surgeon: Festus Aloe, MD;  Location: [facility];  Service: Urology;  Laterality: Left;   ESOPHAGOGASTRODUODENOSCOPY (EGD) WITH PROPOFOL N/A 12/27/2015   Procedure: ESOPHAGOGASTRODUODENOSCOPY (EGD) WITH PROPOFOL;  Surgeon: Manya Silvas, MD;  Location: [facility];  Service: Endoscopy;  Laterality: N/A;   ESOPHAGOGASTRODUODENOSCOPY (EGD) WITH PROPOFOL N/A 08/30/2019   Procedure: ESOPHAGOGASTRODUODENOSCOPY (EGD) WITH PROPOFOL;  Surgeon: Toledo, Benay Pike, MD;  Location: [facility];  Service: Gastroenterology;  Laterality: N/A;   EXTRACORPOREAL SHOCK WAVE LITHOTRIPSY Right 12/30/2017   Procedure: EXTRACORPOREAL SHOCK WAVE LITHOTRIPSY (ESWL);  Surgeon: Royston Cowper, MD;  Location: [facility];  Service: Urology;  Laterality: Right;   EXTRACORPOREAL SHOCK WAVE LITHOTRIPSY Left 03/19/2022   Procedure: EXTRACORPOREAL SHOCK WAVE LITHOTRIPSY (ESWL);  Surgeon: Royston Cowper, MD;  Location: [facility];  Service: Urology;  Laterality:  Left;   EXTRACORPOREAL SHOCK WAVE LITHOTRIPSY Right 08/20/2022   Procedure: EXTRACORPOREAL SHOCK WAVE LITHOTRIPSY (ESWL);  Surgeon: Royston Cowper, MD;  Location: [facility];  Service: Urology;  Laterality: Right;   GANGLION CYST EXCISION Right 08/12/2022   Procedure: EXCISION OF DORSAL CARPAL GANGLION OF RIGHT WRIST;  Surgeon: Corky Mull, MD;  Location: [facility];  Service: Orthopedics;  Laterality: Right;   HERNIA REPAIR  7858    umbilical   KNEE ARTHROSCOPY Right 2015   had bursa sack repaired   KNEE ARTHROSCOPY WITH MEDIAL MENISECTOMY Right 10/11/2020   Procedure: Right knee arthroscopy partial medial meniscectomy;  Surgeon: Leim Fabry, MD;  Location: [place];  Service: Orthopedics;  Laterality: Right;  Diabetic - oral meds sleep apnea   KNEE ARTHROSCOPY WITH MENISCAL REPAIR Right 11/18/2018   Procedure: KNEE ARTHROSCOPY WITH MENISCAL REPAIR AND CHONDROPLASTY;  Surgeon: Leim Fabry, MD;  Location: [place];  Service: Orthopedics;  Laterality: Right;  Diabetic - oral meds sleep apnea SUPINE WITH ACL LEG HOLDER SMITH AND NEWPHEW CETERIX   REPAIR EXTENSOR TENDON Right 10/22/2022   Procedure: EXTENSOR TENOSYNOVECTOMY OF RIGHT WRIST;  Surgeon: Corky Mull, MD;  Location: [facility];  Service: Orthopedics;  Laterality: Right;   SHOULDER SURGERY Right 2011   tendon was shredded and was trimmed, repaired and reattached. Screws in shoulder   TOTAL HIP ARTHROPLASTY Left 10/28/2021   Procedure: TOTAL HIP ARTHROPLASTY;  Surgeon: Corky Mull, MD;  Location: [facility];  Service: Orthopedics;  Laterality: Left;   URETEROSCOPY WITH HOLMIUM LASER LITHOTRIPSY Bilateral 06/05/2016   Procedure: URETEROSCOPY WITH HOLMIUM LASER LITHOTRIPSY;  Surgeon: Nickie Retort, MD;  Location: [facility];  Service: Urology;  Laterality: Bilateral;   VASECTOMY  2002    There were no vitals filed for this visit.   Subjective Assessment - 12/14/22 0830     Subjective  That pain is better - not as sharp -was in he ED over weekend - so did not do to much exercises -but did some - think it is better    Pertinent History 11/20/22 Ortho note : MCKENNON ZWART is a 57 y.o. male who presents today for a repeat skin check status post a revision extensive tenosynovectomy involving the dorsal aspect of the right wrist. The patient is now 4 weeks status post surgery, surgery was performed by Dr. Roland Rack on 10/22/2022. At his initial  postop visit the patient was instructed to continue to wear the Velcro wrist splint routinely except for showers and continue with the Ace wrap applied to the right wrist to minimize the amount of swelling along the dorsal aspect of the right wrist. The patient denies any trauma or injury affecting right wrist since his procedure. He denies any signs of infection at home such as fevers chills or any drainage from the dorsal aspect of the right wrist. The patient has not noticed any significant focal swelling like he did after his initial tenosynovectomy. The patient has been out of work since the surgery. He does still have swelling noted in his fingers but states that this is improving at this time. The patient reports a 2 out of 10 pain score in the right upper extremity at today's visit. He is not taking any medication consistently for the right wrist at this time. REfer to OT    Patient Stated Goals Want my motion and strength back in my R wrist so I can go back to work    Currently in Pain?  No/denies                [facility] OT Assessment - 12/14/22 0001       AROM   Right Wrist Extension 60 Degrees    Right Wrist Flexion 50 Degrees            increase for flexion and extention in session to 70 degrees           OT Treatments/Exercises (OP) - 12/14/22 0001       RUE Paraffin   Number Minutes Paraffin 8 Minutes    RUE Paraffin Location Wrist    Comments 2 flexion, ext stretch x 2 each             Pt making good progress the last 3 session with use of Paraffin and at home - with a prolonged 2 minutes flexion extension stretch. Done some scar massage as well as sweeping using Graston #2 over volar and dorsal wrist and forearm. As well as mini massager with wrist in extention over scar - less nerve pain or tenderness    Patient to focus on close fist wrist flexion extension stretches - great progress after paraffin   Patient to make sure he does not compensate with  long extensors with wrist extension. table slides for wrist extention pain free 20 reps  CPM for wrist extention 200 sec Followed by upgrade to     2 lbs  wrist and forearm in all planes - 15 reps  1 set of 12-15 at home  Great progress in AROM  2 x day after his motion      No splint but Benik neoprene during day with heavy activities  Encourage pt to start doing more tasks around the house - light and pain less than 2/10                    OT Education - 12/14/22 0832     Education Details progress and changes  HEP    Person(s) Educated Patient    Methods Explanation;Demonstration;Tactile cues;Verbal cues;Handout    Comprehension Verbalized understanding;Returned demonstration;Verbal cues required              OT Short Term Goals - 11/27/22 1631       OT SHORT TERM GOAL #1   Title Patient to be independent in home program to decrease scar tissue and edema and increase wrist flexion extension.    Baseline No knowledge of home program.  Wrist extension 35, flat 25 to wrist.    Time 3    Period Weeks    Status New    Target Date 12/18/22               OT Long Term Goals - 11/27/22 1631       OT LONG TERM GOAL #1   Title Right wrist flexion extension increased to within functional limits for patient to push and pull heavy door, twist and turn knobs without increase symptoms    Baseline Right wrist extension 35 degrees, and 25 degrees.  Pain with functional use can increase to 3/10.  With sometimes shooting or sharp pain 7/10.    Time 6    Period Weeks    Status New    Target Date 01/08/23      OT LONG TERM GOAL #2   Title Strength in the right wrist increased to 5/5 for patient to return back to work without increase symptoms    Baseline Patient  5 weeks postop flexion 25, extension 35 degrees.  Pain 3-7/10.  No strengthening yet.    Time 6    Period Weeks    Status New    Target Date 01/08/23      OT LONG TERM GOAL #3   Title Right grip and  prehension strength increased to more than 75% compared to the left with increased symptoms to return back to work.    Baseline Not tested patient 5 weeks postop limited flexion extension of the wrist to 25-35 degrees.  In 3- 7/10 at rest    Time 6    Period Weeks    Status New    Target Date 01/08/23                   Plan - 12/14/22 6659     Clinical Impression Statement Patient  post right dorsal wrist tenosynovectomy 10/22/22.  Patient had a cyst removed in October 23.  Patient present at eval with decreased right dominant wrist flexion/ extension, increased scar tissue increased pain with some edema in hand/ wrist.  Patient limited in functional use of right dominant hand in ADLs and IADLs as well as to return to work. Pt making great progress in edema and pain -as well as increase flexion , ext of wrist every  session - nerve pain over sensory ulnar branch  on dorsal hand improving-  and was able to upgrade to 2 lbs today for wrist all planes. Pt to cont using paraffin at home with stretches. Patient can benefit from skilled OT services to increase motion, increase scar tissue and pain increase strength to return back to prior level of function.    OT Occupational Profile and History Problem Focused Assessment - Including review of records relating to presenting problem    Occupational performance deficits (Please refer to evaluation for details): ADL's;IADL's;Work;Social Participation;Leisure;Play    Body Structure / Function / Physical Skills ADL;Scar mobility;UE functional use;Flexibility;IADL;Pain;Strength;Edema;ROM    Rehab Potential Good    Clinical Decision Making Limited treatment options, no task modification necessary    Comorbidities Affecting Occupational Performance: None    Modification or Assistance to Complete Evaluation  No modification of tasks or assist necessary to complete eval    OT Frequency 2x / week    OT Duration 6 weeks    OT Treatment/Interventions  Self-care/ADL training;Moist Heat;Paraffin;Fluidtherapy;Contrast Bath;Passive range of motion;Therapeutic activities;Splinting;Scar mobilization;Manual Therapy;Therapeutic exercise;Patient/family education    Consulted and Agree with Plan of Care Patient             Patient will benefit from skilled therapeutic intervention in order to improve the following deficits and impairments:   Body Structure / Function / Physical Skills: ADL, Scar mobility, UE functional use, Flexibility, IADL, Pain, Strength, Edema, ROM       Visit Diagnosis: Pain in right wrist  Scar condition and fibrosis of skin  Muscle weakness (generalized)  Stiffness of right wrist, not elsewhere classified    Problem List Patient Active Problem List   Diagnosis Date Noted   Status post total hip replacement, left 10/28/2021   Dyslipidemia 08/24/2021   Status post colonoscopy 06/02/2019   Hepatic steatosis 06/02/2019   Barrett's esophagus without dysplasia 01/19/2019   High coronary artery calcium score 12/29/2018   Chronic iron deficiency anemia 12/22/2017   Gastritis without bleeding 06/25/2017   Vitamin B12 deficiency 08/03/2016   OSA on CPAP 04/18/2016   Nephrolithiasis 04/13/2016   Chronic midline low back pain without sciatica 01/25/2016  Primary osteoarthritis involving multiple joints 11/20/2015   Bilateral carpal tunnel syndrome 09/14/2015   Essential hypertension 09/14/2015   Chronic knee pain 08/30/2014   Erectile dysfunction 08/30/2014   Other allergic rhinitis 08/30/2014   Onychomycosis of toenail 07/24/2014   Hyperlipidemia associated with type 2 diabetes mellitus ([place]) 04/23/2014   Type II diabetes mellitus with complication ([place]) 36/04/7702    Rosalyn Gess, OTR/L,CLT 12/14/2022, 8:58 AM  [facility] [address] [place], [address] Phone: [phone]   Fax:  [phone]  Name: TOBI LEINWEBER MRN:  [id number] Date of Birth: [birth year]

## 2022-12-15 ENCOUNTER — Encounter: Payer: Self-pay | Admitting: Emergency Medicine

## 2022-12-15 ENCOUNTER — Emergency Department: Payer: Managed Care, Other (non HMO)

## 2022-12-15 ENCOUNTER — Other Ambulatory Visit: Payer: Self-pay

## 2022-12-15 ENCOUNTER — Observation Stay
Admission: EM | Admit: 2022-12-15 | Discharge: 2022-12-17 | Disposition: A | Discharge status: Home / Self Care | Payer: Managed Care, Other (non HMO) | Attending: Internal Medicine | Admitting: Internal Medicine

## 2022-12-15 DIAGNOSIS — K449 Diaphragmatic hernia without obstruction or gangrene: Secondary | ICD-10-CM | POA: Diagnosis not present

## 2022-12-15 DIAGNOSIS — K5731 Diverticulosis of large intestine without perforation or abscess with bleeding: Secondary | ICD-10-CM | POA: Insufficient documentation

## 2022-12-15 DIAGNOSIS — K640 First degree hemorrhoids: Secondary | ICD-10-CM | POA: Diagnosis not present

## 2022-12-15 DIAGNOSIS — K625 Hemorrhage of anus and rectum: Secondary | ICD-10-CM | POA: Insufficient documentation

## 2022-12-15 DIAGNOSIS — K229 Disease of esophagus, unspecified: Secondary | ICD-10-CM | POA: Insufficient documentation

## 2022-12-15 DIAGNOSIS — Z79899 Other long term (current) drug therapy: Secondary | ICD-10-CM | POA: Diagnosis not present

## 2022-12-15 DIAGNOSIS — J45909 Unspecified asthma, uncomplicated: Secondary | ICD-10-CM | POA: Insufficient documentation

## 2022-12-15 DIAGNOSIS — K922 Gastrointestinal hemorrhage, unspecified: Secondary | ICD-10-CM | POA: Diagnosis not present

## 2022-12-15 DIAGNOSIS — I129 Hypertensive chronic kidney disease with stage 1 through stage 4 chronic kidney disease, or unspecified chronic kidney disease: Secondary | ICD-10-CM | POA: Diagnosis not present

## 2022-12-15 DIAGNOSIS — E1169 Type 2 diabetes mellitus with other specified complication: Secondary | ICD-10-CM | POA: Diagnosis present

## 2022-12-15 DIAGNOSIS — E1165 Type 2 diabetes mellitus with hyperglycemia: Secondary | ICD-10-CM | POA: Insufficient documentation

## 2022-12-15 DIAGNOSIS — I2510 Atherosclerotic heart disease of native coronary artery without angina pectoris: Secondary | ICD-10-CM | POA: Diagnosis not present

## 2022-12-15 DIAGNOSIS — Z85828 Personal history of other malignant neoplasm of skin: Secondary | ICD-10-CM | POA: Diagnosis not present

## 2022-12-15 DIAGNOSIS — N1831 Chronic kidney disease, stage 3a: Secondary | ICD-10-CM | POA: Diagnosis not present

## 2022-12-15 DIAGNOSIS — Z7984 Long term (current) use of oral hypoglycemic drugs: Secondary | ICD-10-CM | POA: Insufficient documentation

## 2022-12-15 DIAGNOSIS — D500 Iron deficiency anemia secondary to blood loss (chronic): Secondary | ICD-10-CM | POA: Diagnosis not present

## 2022-12-15 DIAGNOSIS — K2270 Barrett's esophagus without dysplasia: Secondary | ICD-10-CM | POA: Diagnosis present

## 2022-12-15 DIAGNOSIS — K921 Melena: Secondary | ICD-10-CM | POA: Diagnosis not present

## 2022-12-15 DIAGNOSIS — G4733 Obstructive sleep apnea (adult) (pediatric): Secondary | ICD-10-CM

## 2022-12-15 DIAGNOSIS — E785 Hyperlipidemia, unspecified: Secondary | ICD-10-CM | POA: Diagnosis present

## 2022-12-15 DIAGNOSIS — R109 Unspecified abdominal pain: Secondary | ICD-10-CM | POA: Diagnosis present

## 2022-12-15 DIAGNOSIS — Z96642 Presence of left artificial hip joint: Secondary | ICD-10-CM | POA: Insufficient documentation

## 2022-12-15 DIAGNOSIS — I10 Essential (primary) hypertension: Secondary | ICD-10-CM | POA: Diagnosis present

## 2022-12-15 DIAGNOSIS — K760 Fatty (change of) liver, not elsewhere classified: Secondary | ICD-10-CM | POA: Diagnosis present

## 2022-12-15 LAB — GLUCOSE, CAPILLARY
Glucose-Capillary: 180 mg/dL — ABNORMAL HIGH (ref 70–99)
Glucose-Capillary: 116 mg/dL — ABNORMAL HIGH (ref 70–99)

## 2022-12-15 LAB — COMPREHENSIVE METABOLIC PANEL
ALT: 17 U/L (ref 0–44)
AST: 26 U/L (ref 15–41)
Albumin: 4.1 g/dL (ref 3.5–5.0)
Alkaline Phosphatase: 35 U/L — ABNORMAL LOW (ref 38–126)
Anion gap: 9 (ref 5–15)
BUN: 30 mg/dL — ABNORMAL HIGH (ref 6–20)
CO2: 22 mmol/L (ref 22–32)
Calcium: 9.6 mg/dL (ref 8.9–10.3)
Chloride: 108 mmol/L (ref 98–111)
Creatinine, Ser: 1.32 mg/dL — ABNORMAL HIGH (ref 0.61–1.24)
GFR, Estimated: >60 mL/min (ref >60)
Glucose, Bld: 129 mg/dL — ABNORMAL HIGH (ref 70–99)
Potassium: 4.6 mmol/L (ref 3.5–5.1)
Sodium: 139 mmol/L (ref 135–145)
Total Bilirubin: 0.6 mg/dL (ref 0.3–1.2)
Total Protein: 6.9 g/dL (ref 6.5–8.1)

## 2022-12-15 LAB — HEMOGLOBIN AND HEMATOCRIT, BLOOD
HCT: 31 % — ABNORMAL LOW (ref 39.0–52.0)
Hemoglobin: 10.1 g/dL — ABNORMAL LOW (ref 13.0–17.0)

## 2022-12-15 LAB — CBC WITH DIFFERENTIAL/PLATELET
Abs Immature Granulocytes: 0.03 10*3/uL (ref 0.00–0.07)
Basophils Absolute: 0.1 10*3/uL (ref 0.0–0.1)
Basophils Relative: 1 %
Eosinophils Absolute: 0.1 10*3/uL (ref 0.0–0.5)
Eosinophils Relative: 2 %
HCT: 33.7 % — ABNORMAL LOW (ref 39.0–52.0)
Hemoglobin: 10.7 g/dL — ABNORMAL LOW (ref 13.0–17.0)
Immature Granulocytes: 0 %
Lymphocytes Relative: 25 %
Lymphs Abs: 2 10*3/uL (ref 0.7–4.0)
MCH: 29 pg (ref 26.0–34.0)
MCHC: 31.8 g/dL (ref 30.0–36.0)
MCV: 91.3 fL (ref 80.0–100.0)
Monocytes Absolute: 0.7 10*3/uL (ref 0.1–1.0)
Monocytes Relative: 9 %
Neutro Abs: 5.3 10*3/uL (ref 1.7–7.7)
Neutrophils Relative %: 63 %
Platelets: 337 10*3/uL (ref 150–400)
RBC: 3.69 MIL/uL — ABNORMAL LOW (ref 4.22–5.81)
RDW: 14.8 % (ref 11.5–15.5)
WBC: 8.2 10*3/uL (ref 4.0–10.5)
nRBC: 0 % (ref 0.0–0.2)

## 2022-12-15 LAB — URINALYSIS, ROUTINE W REFLEX MICROSCOPIC
Bilirubin Urine: NEGATIVE
Glucose, UA: NEGATIVE mg/dL
Hgb urine dipstick: NEGATIVE
Ketones, ur: NEGATIVE mg/dL
Nitrite: NEGATIVE
Protein, ur: NEGATIVE mg/dL
Specific Gravity, Urine: 1.019 (ref 1.005–1.030)
pH: 6 (ref 5.0–8.0)

## 2022-12-15 LAB — HEMOGLOBIN A1C
Hgb A1c MFr Bld: 7 % — ABNORMAL HIGH (ref 4.8–5.6)
Mean Plasma Glucose: 154.2 mg/dL

## 2022-12-15 LAB — CBG MONITORING, ED: Glucose-Capillary: 91 mg/dL (ref 70–99)

## 2022-12-15 LAB — TYPE AND SCREEN
ABO/RH(D): O POS
Antibody Screen: NEGATIVE

## 2022-12-15 LAB — LIPASE, BLOOD: Lipase: 46 U/L (ref 11–51)

## 2022-12-15 MED ORDER — TAMSULOSIN HCL 0.4 MG PO CAPS
0.4000 mg | ORAL_CAPSULE | Freq: Every evening | ORAL | Status: DC
  Administered 2022-12-16: 0.4 mg via ORAL
  Filled 2022-12-15: qty 1

## 2022-12-15 MED ORDER — IOHEXOL 350 MG/ML SOLN
75.0000 mL | Freq: Once | INTRAVENOUS | Status: AC | PRN
  Administered 2022-12-15: 75 mL via INTRAVENOUS

## 2022-12-15 MED ORDER — FERROUS GLUCONATE 324 (38 FE) MG PO TABS
324.0000 mg | ORAL_TABLET | Freq: Every day | ORAL | Status: DC
  Administered 2022-12-17: 324 mg via ORAL
  Filled 2022-12-15 (×2): qty 1

## 2022-12-15 MED ORDER — ACETAMINOPHEN 325 MG PO TABS: 650.0000 mg | ORAL_TABLET | Freq: Four times a day (QID) | ORAL | Status: DC | PRN

## 2022-12-15 MED ORDER — HYDRALAZINE HCL 20 MG/ML IJ SOLN: 10.0000 mg | Freq: Four times a day (QID) | INTRAMUSCULAR | Status: DC | PRN

## 2022-12-15 MED ORDER — EZETIMIBE 10 MG PO TABS
10.0000 mg | ORAL_TABLET | Freq: Every day | ORAL | Status: DC
  Administered 2022-12-17: 10 mg via ORAL
  Filled 2022-12-15 (×2): qty 1

## 2022-12-15 MED ORDER — ALBUTEROL SULFATE (2.5 MG/3ML) 0.083% IN NEBU: 2.5000 mg | INHALATION_SOLUTION | Freq: Four times a day (QID) | RESPIRATORY_TRACT | Status: DC | PRN

## 2022-12-15 MED ORDER — FOLIC ACID 1 MG PO TABS
1.0000 mg | ORAL_TABLET | Freq: Every day | ORAL | Status: DC
  Administered 2022-12-17: 1 mg via ORAL
  Filled 2022-12-15: qty 1

## 2022-12-15 MED ORDER — ATORVASTATIN CALCIUM 20 MG PO TABS
80.0000 mg | ORAL_TABLET | Freq: Every evening | ORAL | Status: DC
  Administered 2022-12-15 – 2022-12-16 (×2): 80 mg via ORAL
  Filled 2022-12-15 (×2): qty 4

## 2022-12-15 MED ORDER — PIOGLITAZONE HCL 15 MG PO TABS
15.0000 mg | ORAL_TABLET | Freq: Every day | ORAL | Status: DC
  Administered 2022-12-17: 15 mg via ORAL
  Filled 2022-12-15 (×2): qty 1

## 2022-12-15 MED ORDER — FERROUS GLUCONATE 324 (38 FE) MG PO TABS: 324.0000 mg | ORAL_TABLET | Freq: Three times a day (TID) | ORAL | Status: DC

## 2022-12-15 MED ORDER — DULOXETINE HCL 30 MG PO CPEP
60.0000 mg | ORAL_CAPSULE | Freq: Every day | ORAL | Status: DC
  Administered 2022-12-17: 60 mg via ORAL
  Filled 2022-12-15: qty 2

## 2022-12-15 MED ORDER — ALBUTEROL SULFATE (2.5 MG/3ML) 0.083% IN NEBU
2.5000 mg | INHALATION_SOLUTION | Freq: Four times a day (QID) | RESPIRATORY_TRACT | Status: DC
  Administered 2022-12-15 (×2): 2.5 mg via RESPIRATORY_TRACT
  Filled 2022-12-15 (×2): qty 3

## 2022-12-15 MED ORDER — MORPHINE SULFATE (PF) 4 MG/ML IV SOLN
4.0000 mg | Freq: Once | INTRAVENOUS | Status: AC
  Administered 2022-12-15: 4 mg via INTRAVENOUS
  Filled 2022-12-15: qty 1

## 2022-12-15 MED ORDER — PEG 3350-KCL-NA BICARB-NACL 420 G PO SOLR
4000.0000 mL | Freq: Once | ORAL | Status: AC
  Administered 2022-12-15: 4000 mL via ORAL
  Filled 2022-12-15: qty 4000

## 2022-12-15 MED ORDER — INSULIN ASPART 100 UNIT/ML IJ SOLN: 0.0000 [IU] | Freq: Every day | INTRAMUSCULAR | Status: DC

## 2022-12-15 MED ORDER — ACETAMINOPHEN 650 MG RE SUPP: 650.0000 mg | Freq: Four times a day (QID) | RECTAL | Status: DC | PRN

## 2022-12-15 MED ORDER — GABAPENTIN 300 MG PO CAPS
300.0000 mg | ORAL_CAPSULE | Freq: Three times a day (TID) | ORAL | Status: DC
  Administered 2022-12-15 – 2022-12-17 (×4): 300 mg via ORAL
  Filled 2022-12-15 (×4): qty 1

## 2022-12-15 MED ORDER — MORPHINE SULFATE (PF) 2 MG/ML IV SOLN
1.0000 mg | INTRAVENOUS | Status: DC | PRN
  Administered 2022-12-16 (×3): 1 mg via INTRAVENOUS
  Filled 2022-12-15 (×4): qty 1

## 2022-12-15 MED ORDER — IOHEXOL 300 MG/ML  SOLN
100.0000 mL | Freq: Once | INTRAMUSCULAR | Status: AC | PRN
  Administered 2022-12-17: 100 mL via INTRAVENOUS

## 2022-12-15 MED ORDER — OXYCODONE HCL 5 MG PO TABS: 5.0000 mg | ORAL_TABLET | ORAL | Status: DC | PRN

## 2022-12-15 MED ORDER — INSULIN ASPART 100 UNIT/ML IJ SOLN
0.0000 [IU] | Freq: Three times a day (TID) | INTRAMUSCULAR | Status: DC
  Administered 2022-12-17: 1 [IU] via SUBCUTANEOUS
  Filled 2022-12-15: qty 1

## 2022-12-15 MED ORDER — LOSARTAN POTASSIUM 50 MG PO TABS
50.0000 mg | ORAL_TABLET | Freq: Every day | ORAL | Status: DC
  Administered 2022-12-17: 50 mg via ORAL
  Filled 2022-12-15: qty 1

## 2022-12-15 MED ORDER — FENOFIBRATE 54 MG PO TABS
54.0000 mg | ORAL_TABLET | Freq: Every day | ORAL | Status: DC
  Administered 2022-12-17: 54 mg via ORAL
  Filled 2022-12-15 (×2): qty 1

## 2022-12-15 MED ORDER — BUPROPION HCL ER (XL) 150 MG PO TB24
300.0000 mg | ORAL_TABLET | Freq: Every day | ORAL | Status: DC
  Administered 2022-12-17: 300 mg via ORAL
  Filled 2022-12-15: qty 2

## 2022-12-15 NOTE — ED Provider Notes (Signed)
[facility] Provider Note    Event Date/Time   First MD Initiated Contact with Patient 12/15/22 [phone]     (approximate)   History   Chief Complaint Abdominal Pain   HPI  Jesse Alvarado is a 57 y.o. male with past medical history of hypertension, hyperlipidemia, diabetes, CAD, CKD, gastritis, and Barrett's esophagus who presents to the ED complaining of abdominal pain.  Patient reports that he has had about 5 days of intermittent bloody stool, described as brown stool with streaks of bright red blood.  He was evaluated in the ED for this 3 days ago, when hemoglobin noted to be stable and he was discharged home with plan for GI follow-up.  He spoke with his GI doctor's office and has an appointment scheduled in 2 days, but has continued to notice bloody stool since then.  He reports another couple of bowel movements earlier this morning that were brown and streaked with blood.  He is also now experiencing pain in the bilateral lower quadrants of his abdomen.  He describes this pain as crampy and intermittent, not exacerbated or alleviated by anything.  He has not had any nausea or vomiting and denies any fevers.  He does not take a blood thinner.     Physical Exam   Triage Vital Signs: ED Triage Vitals  Enc Vitals Group     BP 12/15/22 0632 113/81     Pulse Rate 12/15/22 0632 (!) 104     Resp 12/15/22 0632 18     Temp 12/15/22 0632 97.9 F (36.6 C)     Temp Source 12/15/22 0632 Oral     SpO2 12/15/22 0632 100 %     Weight 12/15/22 0633 179 lb (81.2 kg)     Height 12/15/22 0633 '5\' 10"'$  (1.778 m)     Head Circumference --      Peak Flow --      Pain Score 12/15/22 0633 5     Pain Loc --      Pain Edu? --      Excl. in [place]? --     Most recent vital signs: Vitals:   12/15/22 0632 12/15/22 1053  BP: 113/81 118/78  Pulse: (!) 104 100  Resp: 18 18  Temp: 97.9 F (36.6 C) 98 F (36.7 C)  SpO2: 100% 100%    Constitutional: Alert and oriented. Eyes:  Conjunctivae are normal. Head: Atraumatic. Nose: No congestion/rhinnorhea. Mouth/Throat: Mucous membranes are moist.  Cardiovascular: Normal rate, regular rhythm. Grossly normal heart sounds.  2+ radial pulses bilaterally. Respiratory: Normal respiratory effort.  No retractions. Lungs CTAB. Gastrointestinal: Soft and tender to palpation in the bilateral lower quadrants with no rebound or guarding. No distention.  Rectal exam with light brown guaiac positive stool, no hemorrhoids or fissure noted. Musculoskeletal: No lower extremity tenderness nor edema.  Neurologic:  Normal speech and language. No gross focal neurologic deficits are appreciated.    ED Results / Procedures / Treatments   Labs (all labs ordered are listed, but only abnormal results are displayed) Labs Reviewed  CBC WITH DIFFERENTIAL/PLATELET - Abnormal; Notable for the following components:      Result Value   RBC 3.69 (*)    Hemoglobin 10.7 (*)    HCT 33.7 (*)    All other components within normal limits  COMPREHENSIVE METABOLIC PANEL - Abnormal; Notable for the following components:   Glucose, Bld 129 (*)    BUN 30 (*)    Creatinine, Ser 1.32 (*)  Alkaline Phosphatase 35 (*)    All other components within normal limits  URINALYSIS, ROUTINE W REFLEX MICROSCOPIC - Abnormal; Notable for the following components:   Color, Urine YELLOW (*)    APPearance CLEAR (*)    Leukocytes,Ua TRACE (*)    Bacteria, UA RARE (*)    All other components within normal limits  HEMOGLOBIN AND HEMATOCRIT, BLOOD - Abnormal; Notable for the following components:   Hemoglobin 10.1 (*)    HCT 31.0 (*)    All other components within normal limits  LIPASE, BLOOD  TYPE AND SCREEN   RADIOLOGY CT abdomen/pelvis reviewed and interpreted by me with no inflammatory changes, dilated bowel loops, or focal fluid collections.  PROCEDURES:  Critical Care performed: No  Procedures   MEDICATIONS ORDERED IN ED: Medications  iohexol  (OMNIPAQUE) 300 MG/ML solution 100 mL (has no administration in time range)  iohexol (OMNIPAQUE) 350 MG/ML injection 75 mL (75 mLs Intravenous Contrast Given 12/15/22 0806)  morphine (PF) 4 MG/ML injection 4 mg (4 mg Intravenous Given 12/15/22 1113)     IMPRESSION / MDM / ASSESSMENT AND PLAN / ED COURSE  I reviewed the triage vital signs and the nursing notes.                              57 y.o. male with past medical history of hypertension, hyperlipidemia, diabetes, CAD, CKD, gastritis, and Barrett's esophagus who presents to the ED with 5 days of brown stool mixed with bright red blood, now with bilateral lower quadrant abdominal pain.  Patient's presentation is most consistent with acute presentation with potential threat to life or bodily function.  Differential diagnosis includes, but is not limited to, anemia, lower GI bleed, upper GI bleed, diverticulitis, colitis, electrolyte abnormality, AKI.  Patient well-appearing and in no acute distress, vital signs remarkable for mild tachycardia but otherwise reassuring.  He has tenderness to palpation in the bilateral lower quadrants of his abdomen, rectal exam shows light brown stool that is guaiac positive.  Low suspicion for large volume GI bleed at this time as his hemoglobin remained stable from 3 days ago.  Remainder of labs are reassuring with no significant anemia, leukocytosis, lecture abnormality, or AKI.  LFTs and lipase are unremarkable, urinalysis does not appear concerning for infection.  We will further assess with CT imaging and observe patient here in the ED for ongoing bleeding.  CT imaging is negative for acute process, patient did have bloody bowel movement here in the ED and follow-up H&H is slightly downtrending by half a point.  Patient strongly prefers to be admitted to the hospital for further evaluation and case discussed with hospitalist for admission.  I spoke with Dr. Haig Prophet from GI and their team will also evaluate the  patient.      FINAL CLINICAL IMPRESSION(S) / ED DIAGNOSES   Final diagnoses:  Gastrointestinal hemorrhage, unspecified gastrointestinal hemorrhage type     Rx / DC Orders   ED Discharge Orders     None        Note:  This document was prepared using Dragon voice recognition software and may include unintentional dictation errors.   Blake Divine, MD 12/15/22 [phone]

## 2022-12-15 NOTE — ED Notes (Signed)
See triage note  Presents with generalized stomach discomfort for the past 1-2 days   States he noticed dark stools since last Thursday with couple of episodes on bright red noted in stools

## 2022-12-15 NOTE — H&P (Signed)
[facility] History and Physical  Jesse Alvarado AOZ:[id number] DOB: 12-11-1965 DOA: 12/15/2022 PCP: Jesse Lighter, MD  Admitted from: Home Chief Complaint: BRBPR  History of Present Illness: Jesse Alvarado is a 57 y.o. male with PMH significant for DM2, HTN, HLD, sleep apnea on CPAP, CAD, CKD, Barrett's esophagus, diverticulosis Patient presented to the ED with complaint of abdominal pain and BRBPR For the last 1 week, patient reports intermittent bloody stool described as brown stool with streaks of bright red blood.  He was seen in the ED on 2/3, noted to have stable hemoglobin and was discharged home to follow-up with GI as an outpatient.  He had an appointment made for sometime this week but his symptoms worsened and hence he presented to the ED again this morning.  Today, he also mentioned bilateral lower quadrant cramping abdominal pain.  Not on any blood thinners.  In the ED, patient afebrile, heart rate 104, blood pressure normal, breathing on room air Labs with hemoglobin 10.7, stable compared to 3 days ago. CT abdomen and pelvis with contrast did not show any acute findings, mass lesions or adenopathy but it showed stable descending and sigmoid colon diverticulosis without evidence of acute diverticulitis.   EDP spoke with GI on-call Dr. Haig Alvarado who will evaluate the patient in the hospital Hospital service consulted for observation.  It showed a lower pole left renal calculus but no obstructing ureteral calculi or bladder calculi.  It also showed bilateral stable pars defects at L5 with a grade 1 spondylolisthesis.  At the time of my evaluation, patient was lying on bed.  Not in distress.  Review of Systems:  All systems were reviewed and were negative unless otherwise mentioned in the HPI   Past medical history: Past Medical History:  Diagnosis Date   Anemia    Arthritis    Asthma    sports induced asthma. takes inhalers when needed   Barrett's  esophagus without dysplasia    Cancer (HCC)    Basal and squamoous cell   Chronic kidney disease    Complication of anesthesia    high tolerance to pain medication. body absorbs pain med quickly   Coronary artery disease    Cough on exercise    Diabetes mellitus without complication ([place])    Diverticulosis    Erectile dysfunction    Fatty liver    GERD (gastroesophageal reflux disease)    History of kidney stones    Hyperlipidemia    Hypertension    per patient, he does not have high bp but is treated for his kidneys and his diabetes   Pancreatitis    Right shoulder injury    Sleep apnea    use C-PAP    Past surgical history: Past Surgical History:  Procedure Laterality Date   ANKLE GANGLION CYST EXCISION Left    x 5   CARPAL TUNNEL RELEASE Right 10/03/2018   Procedure: CARPAL TUNNEL RELEASE;  Surgeon: Earnestine Leys, MD;  Location: [facility];  Service: Orthopedics;  Laterality: Right;   CARPAL TUNNEL RELEASE Left 10/24/2018   Procedure: CARPAL TUNNEL RELEASE;  Surgeon: Earnestine Leys, MD;  Location: [facility];  Service: Orthopedics;  Laterality: Left;   COLONOSCOPY  05/10/2014   COLONOSCOPY WITH PROPOFOL N/A 08/30/2019   Procedure: COLONOSCOPY WITH PROPOFOL;  Surgeon: Toledo, Benay Pike, MD;  Location: [facility];  Service: Gastroenterology;  Laterality: N/A;   CYSTOSCOPY WITH STENT PLACEMENT Bilateral 06/05/2016   Procedure: CYSTOSCOPY WITH STENT PLACEMENT;  Surgeon: Nickie Retort,  MD;  Location: [facility];  Service: Urology;  Laterality: Bilateral;   CYSTOSCOPY/URETEROSCOPY/HOLMIUM LASER/STENT PLACEMENT Left 04/13/2016   Procedure: CYSTOSCOPY/RETROGRADE PYELOGRAM/URETEROSCOPY WITH HOLMIUM LASER LITHOTRIPSY//STENT PLACEMENT;  Surgeon: Festus Aloe, MD;  Location: [facility];  Service: Urology;  Laterality: Left;   ESOPHAGOGASTRODUODENOSCOPY (EGD) WITH PROPOFOL N/A 12/27/2015   Procedure: ESOPHAGOGASTRODUODENOSCOPY (EGD) WITH PROPOFOL;  Surgeon: Manya Silvas, MD;   Location: [facility];  Service: Endoscopy;  Laterality: N/A;   ESOPHAGOGASTRODUODENOSCOPY (EGD) WITH PROPOFOL N/A 08/30/2019   Procedure: ESOPHAGOGASTRODUODENOSCOPY (EGD) WITH PROPOFOL;  Surgeon: Toledo, Benay Pike, MD;  Location: [facility];  Service: Gastroenterology;  Laterality: N/A;   EXTRACORPOREAL SHOCK WAVE LITHOTRIPSY Right 12/30/2017   Procedure: EXTRACORPOREAL SHOCK WAVE LITHOTRIPSY (ESWL);  Surgeon: Royston Cowper, MD;  Location: [facility];  Service: Urology;  Laterality: Right;   EXTRACORPOREAL SHOCK WAVE LITHOTRIPSY Left 03/19/2022   Procedure: EXTRACORPOREAL SHOCK WAVE LITHOTRIPSY (ESWL);  Surgeon: Royston Cowper, MD;  Location: [facility];  Service: Urology;  Laterality: Left;   EXTRACORPOREAL SHOCK WAVE LITHOTRIPSY Right 08/20/2022   Procedure: EXTRACORPOREAL SHOCK WAVE LITHOTRIPSY (ESWL);  Surgeon: Royston Cowper, MD;  Location: [facility];  Service: Urology;  Laterality: Right;   GANGLION CYST EXCISION Right 08/12/2022   Procedure: EXCISION OF DORSAL CARPAL GANGLION OF RIGHT WRIST;  Surgeon: Corky Mull, MD;  Location: [facility];  Service: Orthopedics;  Laterality: Right;   HERNIA REPAIR  7253   umbilical   KNEE ARTHROSCOPY Right 2015   had bursa sack repaired   KNEE ARTHROSCOPY WITH MEDIAL MENISECTOMY Right 10/11/2020   Procedure: Right knee arthroscopy partial medial meniscectomy;  Surgeon: Leim Fabry, MD;  Location: [facility];  Service: Orthopedics;  Laterality: Right;  Diabetic - oral meds sleep apnea   KNEE ARTHROSCOPY WITH MENISCAL REPAIR Right 11/18/2018   Procedure: KNEE ARTHROSCOPY WITH MENISCAL REPAIR AND CHONDROPLASTY;  Surgeon: Leim Fabry, MD;  Location: [facility];  Service: Orthopedics;  Laterality: Right;  Diabetic - oral meds sleep apnea SUPINE WITH ACL LEG HOLDER SMITH AND NEWPHEW CETERIX   REPAIR EXTENSOR TENDON Right 10/22/2022   Procedure: EXTENSOR TENOSYNOVECTOMY OF RIGHT WRIST;  Surgeon: Corky Mull, MD;  Location:  [facility];  Service: Orthopedics;  Laterality: Right;   SHOULDER SURGERY Right 2011   tendon was shredded and was trimmed, repaired and reattached. Screws in shoulder   TOTAL HIP ARTHROPLASTY Left 10/28/2021   Procedure: TOTAL HIP ARTHROPLASTY;  Surgeon: Corky Mull, MD;  Location: [facility];  Service: Orthopedics;  Laterality: Left;   URETEROSCOPY WITH HOLMIUM LASER LITHOTRIPSY Bilateral 06/05/2016   Procedure: URETEROSCOPY WITH HOLMIUM LASER LITHOTRIPSY;  Surgeon: Nickie Retort, MD;  Location: [facility];  Service: Urology;  Laterality: Bilateral;   VASECTOMY  2002    Social History:  reports that he has never smoked. He has never used smokeless tobacco. He reports that he does not drink alcohol and does not use drugs.  Allergies:  Allergies  Allergen Reactions   Dulaglutide Other (See Comments)    Trulicity- Caused pancreatitis    Monosodium Glutamate Diarrhea    MSG   Ciprofloxacin Other (See Comments)    GI upset   Dulaglutide, Monosodium glutamate, and Ciprofloxacin   Family history:  Family History  Problem Relation Age of Onset   Urolithiasis Father    Kidney disease Father    Prostate cancer Neg Hx    Kidney cancer Neg Hx      Home Meds: Prior to Admission medications   Medication Sig Start Date  End Date Taking? Authorizing Provider  acetaminophen (TYLENOL) 500 MG tablet Take 2 tablets (1,000 mg total) by mouth every 6 (six) hours. 10/29/21   Lattie Corns, PA-C  albuterol (VENTOLIN HFA) 108 (90 Base) MCG/ACT inhaler Inhale 2 puffs into the lungs every 6 (six) hours as needed for wheezing or shortness of breath.    [provider]  atorvastatin (LIPITOR) 80 MG tablet TAKE ONE TABLET BY MOUTH ONE TIME DAILY 11/23/22   Minus Breeding, MD  Azelastine-Fluticasone (DYMISTA) 137-50 MCG/ACT SUSP Place 1 spray into both nostrils 2 (two) times daily.     [provider]  benzonatate (TESSALON) 200 MG capsule Take 200 mg by mouth 3 (three) times  daily as needed for cough.    [provider]  buPROPion (WELLBUTRIN XL) 150 MG 24 hr tablet Take 150 mg by mouth daily.    [provider]  CALCIUM CITRATE PO Take 500 mg by mouth 3 (three) times daily.    [provider]  ciclopirox (PENLAC) 8 % solution Apply 1 application topically at bedtime. Apply over nail and surrounding skin. Apply daily over previous coat. After seven (7) days, may remove with alcohol and continue cycle. 06/02/19   Glean Hess, MD  Continuous Blood Gluc Sensor (FREESTYLE LIBRE 2 SENSOR) MISC USE 1 KIT EVERY 14 DAYS FOR GLUCOSE MONITORING 04/24/21   [provider]  Cranberry (RA CRANBERRY) 500 MG CAPS Take by mouth.    [provider]  cyanocobalamin (VITAMIN B12) 1000 MCG/ML injection Inject into the muscle. 11/07/21   [provider]  ezetimibe (ZETIA) 10 MG tablet Take 1 tablet (10 mg total) by mouth daily. 12/02/22   Hilty, Nadean Corwin, MD  fenofibrate (TRICOR) 145 MG tablet Take 1 tablet by mouth daily. 01/30/22   [provider]  ferrous gluconate (FERGON) 324 MG tablet Take 324 mg by mouth 3 (three) times daily with meals. 10/21/20   [provider]  folic acid (FOLVITE) 1 MG tablet TAKE 1 TABLET(1 MG) BY MOUTH DAILY 07/25/20   Glean Hess, MD  gabapentin (NEURONTIN) 300 MG capsule Take 300 mg by mouth 3 (three) times daily. For chronic cough/laryngeal inflammation 02/02/20   [provider]  glimepiride (AMARYL) 2 MG tablet Take 1 tablet (2 mg total) by mouth 2 (two) times daily. Take '2mg'$  with breakfast, 2 mg with supper 08/12/22   Poggi, Marshall Cork, MD  glucose blood (PRECISION QID TEST) test strip Use once daily Use as instructed.    [provider]  HYDROcodone-acetaminophen (NORCO/VICODIN) 5-325 MG tablet Take 1-2 tablets by mouth every 6 (six) hours as needed for moderate pain or severe pain. 10/22/22 10/22/23  Poggi, Marshall Cork, MD  icosapent Ethyl (VASCEPA) 1 g capsule Take 2  capsules (2 g total) by mouth 2 (two) times daily. 02/23/22   Hilty, Nadean Corwin, MD  ipratropium (ATROVENT) 0.06 % nasal spray Place 2 sprays into both nostrils 2 (two) times daily.  03/11/18   [provider]  JANUVIA 100 MG tablet Take 100 mg by mouth daily. 06/15/22   [provider]  ketoconazole (NIZORAL) 2 % shampoo  04/21/19   [provider]  levocetirizine (XYZAL) 5 MG tablet Take 5 mg by mouth every evening.  03/25/16   [provider]  losartan (COZAAR) 50 MG tablet Take 50 mg by mouth every morning. 03/13/21   [provider]  meloxicam (MOBIC) 15 MG tablet Take 15 mg by mouth daily. 06/15/22   [provider]  metFORMIN (GLUCOPHAGE) 850 MG tablet TAKE 1 TABLET(850 MG) BY MOUTH THREE TIMES DAILY WITH MEALS 06/18/20   Glean Hess, MD  pantoprazole (PROTONIX) 40 MG tablet Take 1 tablet (40 mg total) by mouth daily. 04/01/22 04/01/23  Ward, Delice Bison, DO  pioglitazone (ACTOS) 15 MG tablet Take 15 mg by mouth daily. 10/16/20   [provider]  silodosin (RAPAFLO) 4 MG CAPS capsule Take 8 mg by mouth every evening. 08/20/20   [provider]  sucralfate (CARAFATE) 1 g tablet TAKE 1 TABLET(1 GRAM) BY MOUTH THREE TIMES DAILY WITH MEALS 08/14/20   Glean Hess, MD  tadalafil (CIALIS) 20 MG tablet Take 5 mg by mouth daily. 03/13/21   [provider]  tamsulosin (FLOMAX) 0.4 MG CAPS capsule Take 1 capsule (0.4 mg total) by mouth daily. 08/20/22   Royston Cowper, MD  tiZANidine (ZANAFLEX) 2 MG tablet Take 2 mg by mouth at bedtime as needed for muscle spasms.    [provider]    Physical Exam: Vitals:   12/15/22 4128 12/15/22 0633 12/15/22 1053  BP: 113/81  118/78  Pulse: (!) 104  100  Resp: 18  18  Temp: 97.9 F (36.6 C)  98 F (36.7 C)  TempSrc: Oral  Oral  SpO2: 100%  100%  Weight:  81.2 kg   Height:  '5\' 10"'$  (1.778 m)    Wt Readings from Last 3 Encounters:  12/15/22 81.2 kg  10/22/22 83.5 kg   10/15/22 83.5 kg   Body mass index is 25.68 kg/m.  General exam: Pleasant, middle-aged Caucasian male.  Not in distress Skin: No rashes, lesions or ulcers. HEENT: Atraumatic, normocephalic, no obvious bleeding Lungs: Clear to auscultation bilaterally CVS: Regular rate and rhythm, no murmur GI/Abd soft, mild bilateral lower abdominal tenderness, nondistended, bowel sound present CNS: Alert, awake, oriented x 3 Psychiatry: Mood appropriate Extremities: No pedal edema, no calf tenderness   ------------------------------------------------------------------------------------------------------ Assessment/Plan: Principal Problem:   Acute GI bleeding Active Problems:   Essential hypertension   Hyperlipidemia associated with type 2 diabetes mellitus (HCC)   OSA on CPAP   Barrett's esophagus without dysplasia   Hepatic steatosis   Dyslipidemia  Acute lower GI bleeding History of Barrett's esophagus and diverticulosis Presented with 1 week history of intermittent streaks of bright red blood mixed with brown stool.  Gradually worsening severity now with abdominal pain as well. Last EGD 2020 showed esophageal changes secondary to established Barrett's disease.  Colonoscopy from 2020 showed left colon diverticulosis and nonbleeding internal hemorrhoids. Not on a blood thinner GI Dr. Haig Alvarado was consulted from the ED.  May need EGD/colonoscopy Continue to monitor.  Chronic anemia Hemoglobin chronically low but above 10.  Remains stable. Continue to monitor. Continue PPI, iron, vitamin N86, folic acid Recent Labs    08/04/22 1006 12/12/22 1255 12/12/22 1654 12/15/22 0637 12/15/22 0936  HGB 10.6* 11.1* 10.1* 10.7* 10.1*  MCV 88.1 89.7 90.9 91.3  --    Type 2 diabetes mellitus A1c 8.1 from 422.  Repeat A1c PTA on glimepiride, Januvia, metformin, Actos Start on sliding scale insulin Accu-Cheks. Once oral intake is resumed postprocedure, can resume oral meds. Continue Neurontin  for diabetic neuropathy Lab Results  Component Value Date   HGBA1C 8.1 (H) 10/15/2021   No results for input(s): "GLUCAP" in the last 168 hours.  Hypertension PTA on losartan.  Continue same.  CAD/HLD Lipitor, Zetia, fenofibrate  CKD 3a Creatinine remains at baseline Recent Labs    03/31/22  2100 08/04/22 1006 12/12/22 1255 12/12/22 1654 12/15/22 0637  BUN 30* 26* 22* 20 30*  CREATININE 1.25* 1.20 1.39* 1.45* 1.32*   Anxiety/depression Wellbutrin, Cymbalta  Sleep apnea  on CPAP  Nephrolithiasis CT abdomen showed a lower pole left renal calculus but no obstructing ureteral calculi or bladder calculi. No intervention needed at this time  Bilateral stable pars defects at L5 with a grade 1 spondylolisthesis Neurogenic bladder requiring self cath Continue Flomax  Mobility: Encourage ambulation  Goals of care   Code Status: Full Code    DVT prophylaxis:  SCDs Start: 12/15/22 1211   Antimicrobials: None Fluid: None Consultants: GI Family Communication: None at bedside  Dispo: The patient is from: Home              Anticipated d/c is to: Hopefully home in 1 to 2 days  Diet: Diet Order             Diet NPO time specified Except for: Sips with Meds, Ice Chips  Diet effective midnight           Diet clear liquid Room service appropriate? Yes; Fluid consistency: Thin  Diet effective now                   ------------------------------------------------------------------------------------- Severity of Illness: The appropriate patient status for this patient is OBSERVATION. Observation status is judged to be reasonable and necessary in order to provide the required intensity of service to ensure the patient's safety. The patient's presenting symptoms, physical exam findings, and initial radiographic and laboratory data in the context of their medical condition is felt to place them at decreased risk for further clinical deterioration. Furthermore, it is  anticipated that the patient will be medically stable for discharge from the hospital within 2 midnights of admission.  ------------------------------------------------------------------------------------- Labs on Admission:   CBC: Recent Labs  Lab 12/12/22 1255 12/12/22 1654 12/15/22 0637 12/15/22 0936  WBC 11.3* 6.8 8.2  --   NEUTROABS  --   --  5.3  --   HGB 11.1* 10.1* 10.7* 10.1*  HCT 34.0* 31.0* 33.7* 31.0*  MCV 89.7 90.9 91.3  --   PLT 353 312 337  --     Basic Metabolic Panel: Recent Labs  Lab 12/12/22 1255 12/12/22 1654 12/15/22 0637  NA 135 137 139  K 3.9 4.0 4.6  CL 104 112* 108  CO2 20* 18* 22  GLUCOSE 162* 195* 129*  BUN 22* 20 30*  CREATININE 1.39* 1.45* 1.32*  CALCIUM 9.2 8.1* 9.6    Liver Function Tests: Recent Labs  Lab 12/12/22 1255 12/15/22 0637  AST 27 26  ALT 17 17  ALKPHOS 33* 35*  BILITOT 0.8 0.6  PROT 7.3 6.9  ALBUMIN 4.3 4.1   Recent Labs  Lab 12/15/22 0637  LIPASE 46   No results for input(s): "AMMONIA" in the last 168 hours.  Cardiac Enzymes: No results for input(s): "CKTOTAL", "CKMB", "CKMBINDEX", "TROPONINI" in the last 168 hours.  BNP (last 3 results) No results for input(s): "BNP" in the last 8760 hours.  ProBNP (last 3 results) No results for input(s): "PROBNP" in the last 8760 hours.  CBG: No results for input(s): "GLUCAP" in the last 168 hours.  Lipase     Component Value Date/Time   LIPASE 46 12/15/2022 0637     Urinalysis    Component Value Date/Time   COLORURINE YELLOW (A) 12/15/2022 0637   APPEARANCEUR CLEAR (A) 12/15/2022 0637   APPEARANCEUR Clear 09/09/2017 1450  LABSPEC 1.019 12/15/2022 0637   PHURINE 6.0 12/15/2022 [place] 12/15/2022 0637   HGBUR NEGATIVE 12/15/2022 [place] 12/15/2022 0637   BILIRUBINUR neg 04/05/2020 1027   BILIRUBINUR Negative 09/09/2017 1450   KETONESUR NEGATIVE 12/15/2022 0637   PROTEINUR NEGATIVE 12/15/2022 0637   UROBILINOGEN 0.2  04/05/2020 1027   NITRITE NEGATIVE 12/15/2022 0637   LEUKOCYTESUR TRACE (A) 12/15/2022 0637     Drugs of Abuse  No results found for: "LABOPIA", "COCAINSCRNUR", "LABBENZ", "AMPHETMU", "THCU", "LABBARB"    Radiological Exams on Admission: CT Abdomen Pelvis W Contrast  Result Date: 12/15/2022 CLINICAL DATA:  Abdominal pain and rectal bleeding for 3 days. EXAM: CT ABDOMEN AND PELVIS WITH CONTRAST TECHNIQUE: Multidetector CT imaging of the abdomen and pelvis was performed using the standard protocol following bolus administration of intravenous contrast. RADIATION DOSE REDUCTION: This exam was performed according to the departmental dose-optimization program which includes automated exposure control, adjustment of the mA and/or kV according to patient size and/or use of iterative reconstruction technique. CONTRAST:  35m OMNIPAQUE IOHEXOL 350 MG/ML SOLN COMPARISON:  04/01/2022 FINDINGS: Lower chest: Minimal dependent subpleural atelectasis but no infiltrates or effusions. The heart is normal in size. No pericardial effusion. Hepatobiliary: No hepatic lesions or intrahepatic biliary dilatation. The gallbladder is unremarkable. No common bile duct dilatation. Pancreas: No mass, inflammation or ductal dilatation. Spleen: Normal size.  No focal lesions. Adrenals/Urinary Tract: The adrenal glands are normal. Mild renal cortical scarring changes. Lower pole left renal calculus. No obstructing ureteral calculi or bladder calculi. No worrisome renal lesions. The bladder is partially obscured by artifact from the left hip prosthesis but there appears to be mild chronic uniform bladder wall thickening. Stomach/Bowel: The stomach, duodenum and small bowel are unremarkable. No acute inflammatory process, mass lesions or obstructive findings. The terminal ileum and appendix are normal. Stable descending and sigmoid colon diverticulosis no findings for acute diverticulitis. Vascular/Lymphatic: The aorta is normal in  caliber. No dissection. The branch vessels are patent. The major venous structures are patent. No mesenteric or retroperitoneal mass or adenopathy. Small scattered lymph nodes are noted. Reproductive: The prostate gland and seminal vesicles are grossly normal. Other: No pelvic mass or adenopathy. No free pelvic fluid collections. No inguinal mass or adenopathy. No abdominal wall hernia or subcutaneous lesions. Musculoskeletal: No significant bony findings. 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 IMPRESSION: 1. No acute abdominal/pelvic findings, mass lesions or adenopathy. 2. Stable descending and sigmoid colon diverticulosis without findings for acute diverticulitis. 3. Lower pole left renal calculus but no obstructing ureteral calculi or bladder calculi. 4. 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 Electronically Signed   By: PMarijo SanesM.D.   On: 12/15/2022 08:19     Signed, BTerrilee Croak MD [facility] 12/15/2022

## 2022-12-15 NOTE — Consult Note (Signed)
Consultation  Referring Provider:     Dr Pietro Cassis Admit date 12/15/22 Consult date        12/15/22 Reason for Consultation:     hematochezia         HPI:   Jesse Alvarado is a 57 y.o. male with medical history of hypertension, hyperlipidemia, diabetes, CAD, CKD, gastritis, and Barrett's esophagus - he presented to ED today with abdominal pain and intermittent brpr for the last 5d. Seen in ED 3d ago for same, found to be stable and discharged for o/p follow up. Had some further stools with bright red streaking today so returned to ED. His hemoglobin is stable- has been 10-11 over the last 48m Red cells normocytic. Bun/creatinine stable. Gfr >60. Lipase normal CT A/P today-  IMPRESSION: 1. No acute abdominal/pelvic findings, mass lesions or adenopathy. 2. Stable descending and sigmoid colon diverticulosis without findings for acute diverticulitis. 3. Lower pole left renal calculus but no obstructing ureteral calculi or bladder calculi. 4. 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 Patient reports he saw some red streaky material with his bowel movments since last Thursday. States he had a loose black stool on Satuday that had some red streaks as well-did present to ed as above and says he opted to go home and follow up as o/p at that time. States her then saw a small amount of bloody material in stool Sunday, and says yesterday he afternoon spoke to o/p GI nurse and then developed further rectal bleeding but not much blood- woke at 2am and hod 3 bloody stools over night described as bright red with some abdominal discomfort so came back to ED today.  Denies NV, fever. Endorses + acid regurgitation last night with his symptoms. Denies dysphagia. Denies nsaids other than meloxicam.  He is on bid pantoprazole '40mg'$  and says he is taking as directed. Brother had colon cancer and lung cancer- was in his mid forties when he found out about the colon cancer.  No tobacco. Rare etoh.  No herbals. Recently started bupropion and duloxetine lately. No known problems with sedated procedures   PREVIOUS ENDOSCOPIES:             Colonoscopy 08/30/19- Dr TAlice Reichert hemorrhoids and diverticulosis EGD 08/30/19- Dr TAlice Reichert Barretts without dysplasia/malignancy. Normal exam otherwise. EGD 2017- Dr ETiffany Kocher changes o barretts without dysplasia/malignancy, gastritis, duodenitis. There was no h pylori, other dysplasia/malignancy/celiac.  CT A/P 5/23- IMPRESSION: 1. Hepatic steatosis. 2. Bilateral 2 mm and 3 mm nonobstructing renal calculi. 3. Colonic diverticulosis. RUQ [place]5/23- IMPRESSION: 1. Negative gallbladder. 2. Hepatic steatosis. 3. 8 mm right renal calculus.  Past Medical History:  Diagnosis Date   Anemia    Arthritis    Asthma    sports induced asthma. takes inhalers when needed   Barrett's esophagus without dysplasia    Cancer (HCC)    Basal and squamoous cell   Chronic kidney disease    Complication of anesthesia    high tolerance to pain medication. body absorbs pain med quickly   Coronary artery disease    Cough on exercise    Diabetes mellitus without complication (HCC)    Diverticulosis    Erectile dysfunction    Fatty liver    GERD (gastroesophageal reflux disease)    History of kidney stones    Hyperlipidemia    Hypertension    per patient, he does not have high bp but is treated for his kidneys and his diabetes   Pancreatitis  Right shoulder injury    Sleep apnea    use C-PAP    Past Surgical History:  Procedure Laterality Date   ANKLE GANGLION CYST EXCISION Left    x 5   CARPAL TUNNEL RELEASE Right 10/03/2018   Procedure: CARPAL TUNNEL RELEASE;  Surgeon: Earnestine Leys, MD;  Location: [facility];  Service: Orthopedics;  Laterality: Right;   CARPAL TUNNEL RELEASE Left 10/24/2018   Procedure: CARPAL TUNNEL RELEASE;  Surgeon: Earnestine Leys, MD;  Location: [facility];  Service: Orthopedics;  Laterality: Left;   COLONOSCOPY  05/10/2014    COLONOSCOPY WITH PROPOFOL N/A 08/30/2019   Procedure: COLONOSCOPY WITH PROPOFOL;  Surgeon: Toledo, Benay Pike, MD;  Location: [facility];  Service: Gastroenterology;  Laterality: N/A;   CYSTOSCOPY WITH STENT PLACEMENT Bilateral 06/05/2016   Procedure: CYSTOSCOPY WITH STENT PLACEMENT;  Surgeon: Nickie Retort, MD;  Location: [facility];  Service: Urology;  Laterality: Bilateral;   CYSTOSCOPY/URETEROSCOPY/HOLMIUM LASER/STENT PLACEMENT Left 04/13/2016   Procedure: CYSTOSCOPY/RETROGRADE PYELOGRAM/URETEROSCOPY WITH HOLMIUM LASER LITHOTRIPSY//STENT PLACEMENT;  Surgeon: Festus Aloe, MD;  Location: [facility];  Service: Urology;  Laterality: Left;   ESOPHAGOGASTRODUODENOSCOPY (EGD) WITH PROPOFOL N/A 12/27/2015   Procedure: ESOPHAGOGASTRODUODENOSCOPY (EGD) WITH PROPOFOL;  Surgeon: Manya Silvas, MD;  Location: [facility];  Service: Endoscopy;  Laterality: N/A;   ESOPHAGOGASTRODUODENOSCOPY (EGD) WITH PROPOFOL N/A 08/30/2019   Procedure: ESOPHAGOGASTRODUODENOSCOPY (EGD) WITH PROPOFOL;  Surgeon: Toledo, Benay Pike, MD;  Location: [facility];  Service: Gastroenterology;  Laterality: N/A;   EXTRACORPOREAL SHOCK WAVE LITHOTRIPSY Right 12/30/2017   Procedure: EXTRACORPOREAL SHOCK WAVE LITHOTRIPSY (ESWL);  Surgeon: Royston Cowper, MD;  Location: [facility];  Service: Urology;  Laterality: Right;   EXTRACORPOREAL SHOCK WAVE LITHOTRIPSY Left 03/19/2022   Procedure: EXTRACORPOREAL SHOCK WAVE LITHOTRIPSY (ESWL);  Surgeon: Royston Cowper, MD;  Location: [facility];  Service: Urology;  Laterality: Left;   EXTRACORPOREAL SHOCK WAVE LITHOTRIPSY Right 08/20/2022   Procedure: EXTRACORPOREAL SHOCK WAVE LITHOTRIPSY (ESWL);  Surgeon: Royston Cowper, MD;  Location: [facility];  Service: Urology;  Laterality: Right;   GANGLION CYST EXCISION Right 08/12/2022   Procedure: EXCISION OF DORSAL CARPAL GANGLION OF RIGHT WRIST;  Surgeon: Corky Mull, MD;  Location: [facility];  Service: Orthopedics;  Laterality: Right;    HERNIA REPAIR  3662   umbilical   KNEE ARTHROSCOPY Right 2015   had bursa sack repaired   KNEE ARTHROSCOPY WITH MEDIAL MENISECTOMY Right 10/11/2020   Procedure: Right knee arthroscopy partial medial meniscectomy;  Surgeon: Leim Fabry, MD;  Location: [place];  Service: Orthopedics;  Laterality: Right;  Diabetic - oral meds sleep apnea   KNEE ARTHROSCOPY WITH MENISCAL REPAIR Right 11/18/2018   Procedure: KNEE ARTHROSCOPY WITH MENISCAL REPAIR AND CHONDROPLASTY;  Surgeon: Leim Fabry, MD;  Location: [place];  Service: Orthopedics;  Laterality: Right;  Diabetic - oral meds sleep apnea SUPINE WITH ACL LEG HOLDER SMITH AND NEWPHEW CETERIX   REPAIR EXTENSOR TENDON Right 10/22/2022   Procedure: EXTENSOR TENOSYNOVECTOMY OF RIGHT WRIST;  Surgeon: Corky Mull, MD;  Location: [facility];  Service: Orthopedics;  Laterality: Right;   SHOULDER SURGERY Right 2011   tendon was shredded and was trimmed, repaired and reattached. Screws in shoulder   TOTAL HIP ARTHROPLASTY Left 10/28/2021   Procedure: TOTAL HIP ARTHROPLASTY;  Surgeon: Corky Mull, MD;  Location: [facility];  Service: Orthopedics;  Laterality: Left;   URETEROSCOPY WITH HOLMIUM LASER LITHOTRIPSY Bilateral 06/05/2016   Procedure: URETEROSCOPY WITH HOLMIUM LASER LITHOTRIPSY;  Surgeon: Nickie Retort, MD;  Location:  [facility];  Service: Urology;  Laterality: Bilateral;   VASECTOMY  2002    Family History  Problem Relation Age of Onset   Urolithiasis Father    Kidney disease Father    Prostate cancer Neg Hx    Kidney cancer Neg Hx    Colon cancer-   Social History   Tobacco Use   Smoking status: Never   Smokeless tobacco: Never  Vaping Use   Vaping Use: Never used  Substance Use Topics   Alcohol use: No    Comment: very rare etoh (Holidays)   Drug use: No    Prior to Admission medications   Medication Sig Start Date End Date Taking? Authorizing Provider  acetaminophen (TYLENOL) 500 MG tablet Take 2  tablets (1,000 mg total) by mouth every 6 (six) hours. 10/29/21   Lattie Corns, PA-C  albuterol (VENTOLIN HFA) 108 (90 Base) MCG/ACT inhaler Inhale 2 puffs into the lungs every 6 (six) hours as needed for wheezing or shortness of breath.    [provider]  atorvastatin (LIPITOR) 80 MG tablet TAKE ONE TABLET BY MOUTH ONE TIME DAILY 11/23/22   Minus Breeding, MD  Azelastine-Fluticasone (DYMISTA) 137-50 MCG/ACT SUSP Place 1 spray into both nostrils 2 (two) times daily.     [provider]  benzonatate (TESSALON) 200 MG capsule Take 200 mg by mouth 3 (three) times daily as needed for cough.    [provider]  buPROPion (WELLBUTRIN XL) 150 MG 24 hr tablet Take 150 mg by mouth daily.    [provider]  CALCIUM CITRATE PO Take 500 mg by mouth 3 (three) times daily.    [provider]  ciclopirox (PENLAC) 8 % solution Apply 1 application topically at bedtime. Apply over nail and surrounding skin. Apply daily over previous coat. After seven (7) days, may remove with alcohol and continue cycle. 06/02/19   Glean Hess, MD  Continuous Blood Gluc Sensor (FREESTYLE LIBRE 2 SENSOR) MISC USE 1 KIT EVERY 14 DAYS FOR GLUCOSE MONITORING 04/24/21   [provider]  Cranberry (RA CRANBERRY) 500 MG CAPS Take by mouth.    [provider]  cyanocobalamin (VITAMIN B12) 1000 MCG/ML injection Inject into the muscle. 11/07/21   [provider]  ezetimibe (ZETIA) 10 MG tablet Take 1 tablet (10 mg total) by mouth daily. 12/02/22   Hilty, Nadean Corwin, MD  fenofibrate (TRICOR) 145 MG tablet Take 1 tablet by mouth daily. 01/30/22   [provider]  ferrous gluconate (FERGON) 324 MG tablet Take 324 mg by mouth 3 (three) times daily with meals. 10/21/20   [provider]  folic acid (FOLVITE) 1 MG tablet TAKE 1 TABLET(1 MG) BY MOUTH DAILY 07/25/20   Glean Hess, MD  gabapentin (NEURONTIN) 300 MG capsule Take 300 mg by mouth 3  (three) times daily. For chronic cough/laryngeal inflammation 02/02/20   [provider]  glimepiride (AMARYL) 2 MG tablet Take 1 tablet (2 mg total) by mouth 2 (two) times daily. Take '2mg'$  with breakfast, 2 mg with supper 08/12/22   Poggi, Marshall Cork, MD  glucose blood (PRECISION QID TEST) test strip Use once daily Use as instructed.    [provider]  HYDROcodone-acetaminophen (NORCO/VICODIN) 5-325 MG tablet Take 1-2 tablets by mouth every 6 (six) hours as needed for moderate pain or severe pain. 10/22/22 10/22/23  Poggi, Marshall Cork, MD  icosapent Ethyl (VASCEPA) 1 g capsule Take 2 capsules (2 g total) by mouth 2 (two) times daily. 02/23/22  Pixie Casino, MD  ipratropium (ATROVENT) 0.06 % nasal spray Place 2 sprays into both nostrils 2 (two) times daily.  03/11/18   [provider]  JANUVIA 100 MG tablet Take 100 mg by mouth daily. 06/15/22   [provider]  ketoconazole (NIZORAL) 2 % shampoo  04/21/19   [provider]  levocetirizine (XYZAL) 5 MG tablet Take 5 mg by mouth every evening.  03/25/16   [provider]  losartan (COZAAR) 50 MG tablet Take 50 mg by mouth every morning. 03/13/21   [provider]  meloxicam (MOBIC) 15 MG tablet Take 15 mg by mouth daily. 06/15/22   [provider]  metFORMIN (GLUCOPHAGE) 850 MG tablet TAKE 1 TABLET(850 MG) BY MOUTH THREE TIMES DAILY WITH MEALS 06/18/20   Glean Hess, MD  pantoprazole (PROTONIX) 40 MG tablet Take 1 tablet (40 mg total) by mouth daily. 04/01/22 04/01/23  Ward, Delice Bison, DO  pioglitazone (ACTOS) 15 MG tablet Take 15 mg by mouth daily. 10/16/20   [provider]  silodosin (RAPAFLO) 4 MG CAPS capsule Take 8 mg by mouth every evening. 08/20/20   [provider]  sucralfate (CARAFATE) 1 g tablet TAKE 1 TABLET(1 GRAM) BY MOUTH THREE TIMES DAILY WITH MEALS 08/14/20   Glean Hess, MD  tadalafil (CIALIS) 20 MG tablet Take 5 mg by mouth daily. 03/13/21   [provider]  tamsulosin (FLOMAX) 0.4 MG CAPS capsule Take 1 capsule (0.4 mg total) by mouth daily. 08/20/22   Royston Cowper, MD  tiZANidine (ZANAFLEX) 2 MG tablet Take 2 mg by mouth at bedtime as needed for muscle spasms.    [provider]    Current Facility-Administered Medications  Medication Dose Route Frequency Provider Last Rate Last Admin   acetaminophen (TYLENOL) tablet 650 mg  650 mg Oral Q6H PRN Dahal, Marlowe Aschoff, MD       Or   acetaminophen (TYLENOL) suppository 650 mg  650 mg Rectal Q6H PRN Dahal, Binaya, MD       albuterol (PROVENTIL) (2.5 MG/3ML) 0.083% nebulizer solution 2.5 mg  2.5 mg Nebulization Q6H Dahal, Binaya, MD       hydrALAZINE (APRESOLINE) injection 10 mg  10 mg Intravenous Q6H PRN Dahal, Binaya, MD       insulin aspart (novoLOG) injection 0-5 Units  0-5 Units Subcutaneous QHS Dahal, Binaya, MD       insulin aspart (novoLOG) injection 0-9 Units  0-9 Units Subcutaneous TID WC Dahal, Binaya, MD       iohexol (OMNIPAQUE) 300 MG/ML solution 100 mL  100 mL Intravenous Once PRN Blake Divine, MD       morphine (PF) 2 MG/ML injection 1 mg  1 mg Intravenous Q4H PRN Dahal, Binaya, MD       oxyCODONE (Oxy IR/ROXICODONE) immediate release tablet 5 mg  5 mg Oral Q4H PRN Dahal, Marlowe Aschoff, MD       Current Outpatient Medications  Medication Sig Dispense Refill   acetaminophen (TYLENOL) 500 MG tablet Take 2 tablets (1,000 mg total) by mouth every 6 (six) hours. 30 tablet 0   albuterol (VENTOLIN HFA) 108 (90 Base) MCG/ACT inhaler Inhale 2 puffs into the lungs every 6 (six) hours as needed for wheezing or shortness of breath.     atorvastatin (LIPITOR) 80 MG tablet TAKE ONE TABLET BY MOUTH ONE TIME DAILY 90 tablet 2   Azelastine-Fluticasone (DYMISTA) 137-50 MCG/ACT SUSP Place 1 spray into both nostrils 2 (two) times daily.  benzonatate (TESSALON) 200 MG capsule Take 200 mg by mouth 3 (three) times daily as needed for cough.     buPROPion (WELLBUTRIN XL) 150 MG 24 hr  tablet Take 150 mg by mouth daily.     CALCIUM CITRATE PO Take 500 mg by mouth 3 (three) times daily.     ciclopirox (PENLAC) 8 % solution Apply 1 application topically at bedtime. Apply over nail and surrounding skin. Apply daily over previous coat. After seven (7) days, may remove with alcohol and continue cycle. 6.6 mL 5   Continuous Blood Gluc Sensor (FREESTYLE LIBRE 2 SENSOR) MISC USE 1 KIT EVERY 14 DAYS FOR GLUCOSE MONITORING     Cranberry (RA CRANBERRY) 500 MG CAPS Take by mouth.     cyanocobalamin (VITAMIN B12) 1000 MCG/ML injection Inject into the muscle.     ezetimibe (ZETIA) 10 MG tablet Take 1 tablet (10 mg total) by mouth daily. 90 tablet 3   fenofibrate (TRICOR) 145 MG tablet Take 1 tablet by mouth daily.     ferrous gluconate (FERGON) 324 MG tablet Take 324 mg by mouth 3 (three) times daily with meals.     folic acid (FOLVITE) 1 MG tablet TAKE 1 TABLET(1 MG) BY MOUTH DAILY 90 tablet 0   gabapentin (NEURONTIN) 300 MG capsule Take 300 mg by mouth 3 (three) times daily. For chronic cough/laryngeal inflammation     glimepiride (AMARYL) 2 MG tablet Take 1 tablet (2 mg total) by mouth 2 (two) times daily. Take '2mg'$  with breakfast, 2 mg with supper     glucose blood (PRECISION QID TEST) test strip Use once daily Use as instructed.     HYDROcodone-acetaminophen (NORCO/VICODIN) 5-325 MG tablet Take 1-2 tablets by mouth every 6 (six) hours as needed for moderate pain or severe pain. 30 tablet 0   icosapent Ethyl (VASCEPA) 1 g capsule Take 2 capsules (2 g total) by mouth 2 (two) times daily. 360 capsule 3   ipratropium (ATROVENT) 0.06 % nasal spray Place 2 sprays into both nostrils 2 (two) times daily.   3   JANUVIA 100 MG tablet Take 100 mg by mouth daily.     ketoconazole (NIZORAL) 2 % shampoo      levocetirizine (XYZAL) 5 MG tablet Take 5 mg by mouth every evening.   11   losartan (COZAAR) 50 MG tablet Take 50 mg by mouth every morning.     meloxicam (MOBIC) 15 MG tablet Take 15 mg by mouth  daily.     metFORMIN (GLUCOPHAGE) 850 MG tablet TAKE 1 TABLET(850 MG) BY MOUTH THREE TIMES DAILY WITH MEALS 270 tablet 1   pantoprazole (PROTONIX) 40 MG tablet Take 1 tablet (40 mg total) by mouth daily. 30 tablet 1   pioglitazone (ACTOS) 15 MG tablet Take 15 mg by mouth daily.     silodosin (RAPAFLO) 4 MG CAPS capsule Take 8 mg by mouth every evening.     sucralfate (CARAFATE) 1 g tablet TAKE 1 TABLET(1 GRAM) BY MOUTH THREE TIMES DAILY WITH MEALS 270 tablet 1   tadalafil (CIALIS) 20 MG tablet Take 5 mg by mouth daily.     tamsulosin (FLOMAX) 0.4 MG CAPS capsule Take 1 capsule (0.4 mg total) by mouth daily. 30 capsule 1   tiZANidine (ZANAFLEX) 2 MG tablet Take 2 mg by mouth at bedtime as needed for muscle spasms.      Allergies as of 12/15/2022 - Review Complete 12/15/2022  Allergen Reaction Noted   Dulaglutide Other (See Comments) 11/21/2018  Monosodium glutamate Diarrhea 11/15/2018   Ciprofloxacin Other (See Comments) 12/26/2015     Review of Systems:    All systems reviewed and negative except where noted in HPI.      Physical Exam:  Vital signs in last 24 hours: Temp:  [97.9 F (36.6 C)-98 F (36.7 C)] 98 F (36.7 C) (02/06 1053) Pulse Rate:  [100-104] 100 (02/06 1053) Resp:  [18] 18 (02/06 1053) BP: (113-118)/(78-81) 118/78 (02/06 1053) SpO2:  [100 %] 100 % (02/06 1053) Weight:  [81.2 kg] 81.2 kg (02/06 6387)   General:   Pleasant man in NAD Head:  Normocephalic and atraumatic. Eyes:   No icterus.   Conjunctiva pink. Ears:  Normal auditory acuity. Mouth: Mucosa pink moist, no lesions. Neck:  Supple; no masses felt Lungs:  Respirations even and unlabored. Lungs clear to auscultation bilaterally.   No wheezes, crackles, or rhonchi.  Heart:  S1S2, RRR, no MRG. No edema. Abdomen:   Flat, soft, nondistended, nontender. Normal bowel sounds. No appreciable masses or hepatomegaly. No rebound signs or other peritoneal signs. Rectal:  Not performed.  Msk:  MAEW x4, No  clubbing or cyanosis. Strength 5/5. Symmetrical without gross deformities. Neurologic:  Alert and  oriented x4;  Cranial nerves II-XII intact.  Skin:  Warm, dry, pink without significant lesions or rashes. Psych:  Alert and cooperative. Normal affect.  LAB RESULTS: Recent Labs    12/12/22 1255 12/12/22 1654 12/15/22 0637 12/15/22 0936  WBC 11.3* 6.8 8.2  --   HGB 11.1* 10.1* 10.7* 10.1*  HCT 34.0* 31.0* 33.7* 31.0*  PLT 353 312 337  --    BMET Recent Labs    12/12/22 1255 12/12/22 1654 12/15/22 0637  NA 135 137 139  K 3.9 4.0 4.6  CL 104 112* 108  CO2 20* 18* 22  GLUCOSE 162* 195* 129*  BUN 22* 20 30*  CREATININE 1.39* 1.45* 1.32*  CALCIUM 9.2 8.1* 9.6   LFT Recent Labs    12/15/22 0637  PROT 6.9  ALBUMIN 4.1  AST 26  ALT 17  ALKPHOS 35*  BILITOT 0.6   PT/INR No results for input(s): "LABPROT", "INR" in the last 72 hours.  STUDIES: CT Abdomen Pelvis W Contrast  Result Date: 12/15/2022 CLINICAL DATA:  Abdominal pain and rectal bleeding for 3 days. EXAM: CT ABDOMEN AND PELVIS WITH CONTRAST TECHNIQUE: Multidetector CT imaging of the abdomen and pelvis was performed using the standard protocol following bolus administration of intravenous contrast. RADIATION DOSE REDUCTION: This exam was performed according to the departmental dose-optimization program which includes automated exposure control, adjustment of the mA and/or kV according to patient size and/or use of iterative reconstruction technique. CONTRAST:  76m OMNIPAQUE IOHEXOL 350 MG/ML SOLN COMPARISON:  04/01/2022 FINDINGS: Lower chest: Minimal dependent subpleural atelectasis but no infiltrates or effusions. The heart is normal in size. No pericardial effusion. Hepatobiliary: No hepatic lesions or intrahepatic biliary dilatation. The gallbladder is unremarkable. No common bile duct dilatation. Pancreas: No mass, inflammation or ductal dilatation. Spleen: Normal size.  No focal lesions. Adrenals/Urinary Tract: The  adrenal glands are normal. Mild renal cortical scarring changes. Lower pole left renal calculus. No obstructing ureteral calculi or bladder calculi. No worrisome renal lesions. The bladder is partially obscured by artifact from the left hip prosthesis but there appears to be mild chronic uniform bladder wall thickening. Stomach/Bowel: The stomach, duodenum and small bowel are unremarkable. No acute inflammatory process, mass lesions or obstructive findings. The terminal ileum and appendix are normal. Stable descending and  sigmoid colon diverticulosis no findings for acute diverticulitis. Vascular/Lymphatic: The aorta is normal in caliber. No dissection. The branch vessels are patent. The major venous structures are patent. No mesenteric or retroperitoneal mass or adenopathy. Small scattered lymph nodes are noted. Reproductive: The prostate gland and seminal vesicles are grossly normal. Other: No pelvic mass or adenopathy. No free pelvic fluid collections. No inguinal mass or adenopathy. No abdominal wall hernia or subcutaneous lesions. Musculoskeletal: No significant bony findings. 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 IMPRESSION: 1. No acute abdominal/pelvic findings, mass lesions or adenopathy. 2. Stable descending and sigmoid colon diverticulosis without findings for acute diverticulitis. 3. Lower pole left renal calculus but no obstructing ureteral calculi or bladder calculi. 4. 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 Electronically Signed   By: Marijo Sanes M.D.   On: 12/15/2022 08:19       Impression / Plan:   Hematochezia/suspected episode of melena/dyspepsia/history of barretts and family history of colon cancer in his brother.  Recommend egd and colonoscopy for luminal evaluation. Indications/benefits/risks and procedures were discussed with the patient and he is agreeable   Thank you very much for this consult. These services were  provided by Stephens November, NP-C, in collaboration with Lesly Rubenstein, MD, with whom I have discussed this patient in full.   Stephens November, NP-C

## 2022-12-15 NOTE — ED Triage Notes (Signed)
Patient ambulatory to triage with steady gait, without difficulty or distress noted; pt reports rectal bleeding since Saturday; lower abd pain since last night; seen on Saturday for same

## 2022-12-16 ENCOUNTER — Observation Stay: Payer: Managed Care, Other (non HMO) | Admitting: Anesthesiology

## 2022-12-16 ENCOUNTER — Encounter
Admission: EM | Disposition: A | Discharge status: Home / Self Care | Payer: Self-pay | Source: Home / Self Care | Attending: Emergency Medicine

## 2022-12-16 DIAGNOSIS — K922 Gastrointestinal hemorrhage, unspecified: Secondary | ICD-10-CM | POA: Diagnosis not present

## 2022-12-16 HISTORY — PX: COLONOSCOPY: SHX5424

## 2022-12-16 HISTORY — PX: ESOPHAGOGASTRODUODENOSCOPY: SHX5428

## 2022-12-16 LAB — GLUCOSE, CAPILLARY
Glucose-Capillary: 101 mg/dL — ABNORMAL HIGH (ref 70–99)
Glucose-Capillary: 76 mg/dL (ref 70–99)
Glucose-Capillary: 104 mg/dL — ABNORMAL HIGH (ref 70–99)
Glucose-Capillary: 224 mg/dL — ABNORMAL HIGH (ref 70–99)

## 2022-12-16 LAB — BASIC METABOLIC PANEL
Anion gap: 8 (ref 5–15)
BUN: 17 mg/dL (ref 6–20)
CO2: 24 mmol/L (ref 22–32)
Calcium: 9 mg/dL (ref 8.9–10.3)
Chloride: 106 mmol/L (ref 98–111)
Creatinine, Ser: 1.14 mg/dL (ref 0.61–1.24)
GFR, Estimated: >60 mL/min (ref >60)
Glucose, Bld: 129 mg/dL — ABNORMAL HIGH (ref 70–99)
Potassium: 4.4 mmol/L (ref 3.5–5.1)
Sodium: 138 mmol/L (ref 135–145)

## 2022-12-16 LAB — KOH PREP

## 2022-12-16 LAB — CBC
HCT: 31.5 % — ABNORMAL LOW (ref 39.0–52.0)
Hemoglobin: 10.4 g/dL — ABNORMAL LOW (ref 13.0–17.0)
MCH: 29.7 pg (ref 26.0–34.0)
MCHC: 33 g/dL (ref 30.0–36.0)
MCV: 90 fL (ref 80.0–100.0)
Platelets: 322 10*3/uL (ref 150–400)
RBC: 3.5 MIL/uL — ABNORMAL LOW (ref 4.22–5.81)
RDW: 15.2 % (ref 11.5–15.5)
WBC: 8 10*3/uL (ref 4.0–10.5)
nRBC: 0 % (ref 0.0–0.2)

## 2022-12-16 LAB — HIV ANTIBODY (ROUTINE TESTING W REFLEX): HIV Screen 4th Generation wRfx: NONREACTIVE

## 2022-12-16 SURGERY — EGD (ESOPHAGOGASTRODUODENOSCOPY)
Anesthesia: General

## 2022-12-16 MED ORDER — MORPHINE SULFATE (PF) 2 MG/ML IV SOLN
INTRAVENOUS | Status: AC
  Administered 2022-12-16: 2 mg via INTRAVENOUS
  Filled 2022-12-16: qty 1

## 2022-12-16 MED ORDER — PROPOFOL 10 MG/ML IV BOLUS
INTRAVENOUS | Status: AC
  Filled 2022-12-16: qty 20

## 2022-12-16 MED ORDER — LIDOCAINE HCL (CARDIAC) PF 100 MG/5ML IV SOSY
PREFILLED_SYRINGE | INTRAVENOUS | Status: DC | PRN
  Administered 2022-12-16: 40 mg via INTRAVENOUS

## 2022-12-16 MED ORDER — SODIUM CHLORIDE 0.9 % IV SOLN: INTRAVENOUS | Status: DC | PRN

## 2022-12-16 MED ORDER — PROPOFOL 10 MG/ML IV BOLUS
INTRAVENOUS | Status: DC | PRN
  Administered 2022-12-16: 80 mg via INTRAVENOUS
  Administered 2022-12-16: 30 mg via INTRAVENOUS
  Administered 2022-12-16 (×2): 50 mg via INTRAVENOUS

## 2022-12-16 MED ORDER — PROPOFOL 500 MG/50ML IV EMUL
INTRAVENOUS | Status: DC | PRN
  Administered 2022-12-16: 100 ug/kg/min via INTRAVENOUS

## 2022-12-16 NOTE — Progress Notes (Signed)
PROGRESS NOTE  Jesse Alvarado  DOB: 1966-11-08  PCP: Gladstone Lighter, MD YHC:[id number]  DOA: 12/15/2022  LOS: 0 days  Hospital Day: 2  Brief narrative: Jesse Alvarado is a 57 y.o. male with PMH significant for DM2, HTN, HLD, sleep apnea on CPAP, CAD, CKD, Barrett's esophagus, diverticulosis Patient presented to the ED with complaint of abdominal pain and BRBPR For the last 1 week, patient reports intermittent bloody stool described as brown stool with streaks of bright red blood.  He was seen in the ED on 2/3, noted to have stable hemoglobin and was discharged home to follow-up with GI as an outpatient.  He had an appointment made for sometime this week but his symptoms worsened and hence he presented to the ED again this morning.  Today, he also mentioned bilateral lower quadrant cramping abdominal pain.  Not on any blood thinners.  In the ED, patient afebrile, heart rate 104, blood pressure normal, breathing on room air Labs with hemoglobin 10.7, stable compared to 3 days ago. CT abdomen and pelvis with contrast did not show any acute findings, mass lesions or adenopathy but it showed stable descending and sigmoid colon diverticulosis without evidence of acute diverticulitis.   EDP spoke with GI on-call Dr. Haig Prophet who will evaluate the patient in the hospital Hospital service consulted for observation.  Subjective: Patient was seen and examined this morning. Lying on bed.  Not in distress.  No new symptoms.  No bleeding since admission yesterday. Pending EGD/colonoscopy today. Overnight, afebrile, hemodynamically stable, breathing on room air Labs this morning with hemoglobin stable at 10.4, creatinine improved to 1.14  Assessment/Plan: Acute lower GI bleeding History of Barrett's esophagus and diverticulosis Presented with 1 week history of intermittent streaks of bright red blood mixed with brown stool.  Gradually worsening severity now with abdominal pain as well. Last  EGD 2020 showed esophageal changes secondary to established Barrett's disease.  Colonoscopy from 2020 showed left colon diverticulosis and nonbleeding internal hemorrhoids. Not on a blood thinner GI following.  Tentative plan of EGD/colonoscopy Continue to monitor.  Chronic anemia Hemoglobin chronically low but above 10.  Remains stable. Continue to monitor. Continue PPI, iron, vitamin D17, folic acid Recent Labs    12/12/22 1255 12/12/22 1654 12/15/22 0637 12/15/22 0936 12/16/22 0424  HGB 11.1* 10.1* 10.7* 10.1* 10.4*  MCV 89.7 90.9 91.3  --  90.0   Type 2 diabetes mellitus A1c 7 on 12/15/22 PTA on glimepiride, Januvia, metformin, Actos Currently on sliding scale insulin with Accu-Cheks. Once oral intake is resumed postprocedure, can resume oral meds.  Patient follows up with an endocrinologist and has an upcoming appointment next week. Continue Neurontin for diabetic neuropathy Recent Labs  Lab 12/15/22 1622 12/15/22 1820 12/15/22 2053  GLUCAP 91 180* 116*   Hypertension PTA on losartan.  Continue same.  CAD/HLD Lipitor, Zetia, fenofibrate  CKD 3a Creatinine remains at baseline Recent Labs    03/31/22 2100 08/04/22 1006 12/12/22 1255 12/12/22 1654 12/15/22 0637 12/16/22 0424  BUN 30* 26* 22* 20 30* 17  CREATININE 1.25* 1.20 1.39* 1.45* 1.32* 1.14   Anxiety/depression Wellbutrin, Cymbalta  Sleep apnea  on CPAP  Nephrolithiasis CT abdomen showed a lower pole left renal calculus but no obstructing ureteral calculi or bladder calculi. 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  Mobility: Encourage ambulation  Goals of  care   Code Status: Full Code    DVT prophylaxis:  SCDs Start: 12/15/22 1211   Antimicrobials: None Fluid: None Consultants: GI Family Communication: None at  bedside  Dispo: The patient is from: Home              Anticipated d/c is to: Hopefully home in 1 to 2 days  Scheduled Meds:  atorvastatin  80 mg Oral QPM   buPROPion  300 mg Oral Daily   DULoxetine  60 mg Oral Daily   ezetimibe  10 mg Oral Daily   fenofibrate  54 mg Oral Daily   ferrous gluconate  324 mg Oral Q breakfast   folic acid  1 mg Oral Daily   gabapentin  300 mg Oral TID   insulin aspart  0-5 Units Subcutaneous QHS   insulin aspart  0-9 Units Subcutaneous TID WC   losartan  50 mg Oral Daily   pioglitazone  15 mg Oral Daily   tamsulosin  0.4 mg Oral QPM    PRN meds: acetaminophen **OR** acetaminophen, albuterol, hydrALAZINE, iohexol, morphine injection, oxyCODONE   Infusions:    Diet:  Diet Order             Diet NPO time specified  Diet effective now                   Antimicrobials: Anti-infectives (From admission, onward)    None       Skin assessment:       Nutritional status:  Body mass index is 25.68 kg/m.          Objective: Vitals:   12/15/22 2020 12/16/22 0411  BP: 117/76 115/84  Pulse: 73 83  Resp: 20 20  Temp: 97.6 F (36.4 C) 97.7 F (36.5 C)  SpO2: 100% 99%    Intake/Output Summary (Last 24 hours) at 12/16/2022 0934 Last data filed at 12/15/2022 1853 Gross per 24 hour  Intake 360 ml  Output --  Net 360 ml   Filed Weights   12/15/22 6314  Weight: 81.2 kg   Weight change:  Body mass index is 25.68 kg/m.   Physical Exam:  General exam: Pleasant, middle-aged Caucasian male.  Not in distress Skin: No rashes, lesions or ulcers. HEENT: Atraumatic, normocephalic, no obvious bleeding Lungs: Clear to auscultation bilaterally CVS: Regular rate and rhythm, no murmur GI/Abd soft, mild bilateral lower abdominal tenderness, nondistended, bowel sound present CNS: Alert, awake, oriented x 3 Psychiatry: Mood appropriate Extremities: No pedal edema, no calf tenderness  Data Review: I have personally reviewed the  laboratory data and studies available.  F/u labs  Unresulted Labs (From admission, onward)     Start     Ordered   12/17/22 0500  CBC with Differential/Platelet  Tomorrow morning,   R        12/16/22 0934   12/17/22 9702  Basic metabolic panel  Tomorrow morning,   R        12/16/22 0934   12/15/22 1211  HIV Antibody (routine testing w rflx)  (HIV Antibody (Routine testing w reflex) panel)  Once,   R        12/15/22 1210            Total time spent in review of labs and imaging, patient evaluation, formulation of plan, documentation and communication with family: 62 minutes  Signed, Terrilee Croak, MD [facility] 12/16/2022

## 2022-12-16 NOTE — Op Note (Signed)
[facility] Gastroenterology Patient Name: Jesse Alvarado Procedure Date: 12/16/2022 1:29 PM MRN: [id number] Account #: [id number] Date of Birth: 08/31/1966 Admit Type: Outpatient Age: 57 Room: [facility] ENDO ROOM 2 Gender: Male Note Status: Finalized Instrument Name: Jasper Riling [id number] Procedure:             Colonoscopy Indications:           Rectal bleeding Providers:             Andrey Farmer MD, MD Medicines:             Monitored Anesthesia Care Complications:         No immediate complications. Procedure:             Pre-Anesthesia Assessment:                        - Prior to the procedure, a History and Physical was                         performed, and patient medications and allergies were                         reviewed. The patient is competent. The risks and                         benefits of the procedure and the sedation options and                         risks were discussed with the patient. All questions                         were answered and informed consent was obtained.                         Patient identification and proposed procedure were                         verified by the physician, the nurse, the                         anesthesiologist, the anesthetist and the technician                         in the endoscopy suite. Mental Status Examination:                         alert and oriented. Airway Examination: normal                         oropharyngeal airway and neck mobility. Respiratory                         Examination: clear to auscultation. CV Examination:                         normal. Prophylactic Antibiotics: The patient does not                         require prophylactic antibiotics. Prior  Anticoagulants: The patient has taken no anticoagulant                         or antiplatelet agents. ASA Grade Assessment: III - A                         patient with severe systemic disease. After  reviewing                         the risks and benefits, the patient was deemed in                         satisfactory condition to undergo the procedure. The                         anesthesia plan was to use monitored anesthesia care                         (MAC). Immediately prior to administration of                         medications, the patient was re-assessed for adequacy                         to receive sedatives. The heart rate, respiratory                         rate, oxygen saturations, blood pressure, adequacy of                         pulmonary ventilation, and response to care were                         monitored throughout the procedure. The physical                         status of the patient was re-assessed after the                         procedure.                        After obtaining informed consent, the colonoscope was                         passed under direct vision. Throughout the procedure,                         the patient's blood pressure, pulse, and oxygen                         saturations were monitored continuously. The                         Colonoscope was introduced through the anus and                         advanced to the the terminal ileum. The colonoscopy  was performed without difficulty. The patient                         tolerated the procedure well. The quality of the bowel                         preparation was fair. The terminal ileum, ileocecal                         valve, appendiceal orifice, and rectum were                         photographed. Findings:      The perianal and digital rectal examinations were normal.      The terminal ileum appeared normal.      Multiple small-mouthed diverticula were found in the sigmoid colon,       descending colon and ascending colon.      Internal hemorrhoids were found during retroflexion. The hemorrhoids       were Grade I (internal hemorrhoids that do  not prolapse).      The exam was otherwise without abnormality on direct and retroflexion       views. Impression:            - Preparation of the colon was fair.                        - The examined portion of the ileum was normal.                        - Diverticulosis in the sigmoid colon, in the                         descending colon and in the ascending colon.                        - Internal hemorrhoids.                        - The examination was otherwise normal on direct and                         retroflexion views.                        - No specimens collected. Recommendation:        - Return patient to hospital ward for possible                         discharge same day.                        - Resume regular diet.                        - Continue present medications.                        - Return to GI clinic as previously scheduled.                        -  Ok for discharge today with outpatient GI follow-up Procedure Code(s):     --- Professional ---                        [id number], Colonoscopy, flexible; diagnostic, including                         collection of specimen(s) by brushing or washing, when                         performed (separate procedure) Diagnosis Code(s):     --- Professional ---                        K64.0, First degree hemorrhoids                        K62.5, Hemorrhage of anus and rectum                        K57.30, Diverticulosis of large intestine without                         perforation or abscess without bleeding CPT copyright 2022 American Medical Association. All rights reserved. The codes documented in this report are preliminary and upon coder review may  be revised to meet current compliance requirements. Andrey Farmer MD, MD 12/16/2022 2:30:03 PM Number of Addenda: 0 Note Initiated On: 12/16/2022 1:29 PM Scope Withdrawal Time: 0 hours 5 minutes 22 seconds  Total Procedure Duration: 0 hours 8 minutes 56 seconds   Estimated Blood Loss:  Estimated blood loss: none.      [facility]

## 2022-12-16 NOTE — Op Note (Signed)
[facility] Gastroenterology Patient Name: Jesse Alvarado Procedure Date: 12/16/2022 1:29 PM MRN: [id number] Account #: [id number] Date of Birth: 03/20/1966 Admit Type: Outpatient Age: 57 Room: [facility] ENDO ROOM 2 Gender: Male Note Status: Finalized Instrument Name: Altamese Cabal Endoscope [id number] Procedure:             Upper GI endoscopy Indications:           Melena Providers:             Andrey Farmer MD, MD Medicines:             Monitored Anesthesia Care Complications:         No immediate complications. Procedure:             Pre-Anesthesia Assessment:                        - Prior to the procedure, a History and Physical was                         performed, and patient medications and allergies were                         reviewed. The patient is competent. The risks and                         benefits of the procedure and the sedation options and                         risks were discussed with the patient. All questions                         were answered and informed consent was obtained.                         Patient identification and proposed procedure were                         verified by the physician, the nurse, the                         anesthesiologist, the anesthetist and the technician                         in the endoscopy suite. Mental Status Examination:                         alert and oriented. Airway Examination: normal                         oropharyngeal airway and neck mobility. Respiratory                         Examination: clear to auscultation. CV Examination:                         normal. Prophylactic Antibiotics: The patient does not                         require prophylactic antibiotics. Prior  Anticoagulants: The patient has taken no anticoagulant                         or antiplatelet agents. ASA Grade Assessment: III - A                         patient with severe systemic disease. After  reviewing                         the risks and benefits, the patient was deemed in                         satisfactory condition to undergo the procedure. The                         anesthesia plan was to use monitored anesthesia care                         (MAC). Immediately prior to administration of                         medications, the patient was re-assessed for adequacy                         to receive sedatives. The heart rate, respiratory                         rate, oxygen saturations, blood pressure, adequacy of                         pulmonary ventilation, and response to care were                         monitored throughout the procedure. The physical                         status of the patient was re-assessed after the                         procedure.                        After obtaining informed consent, the endoscope was                         passed under direct vision. Throughout the procedure,                         the patient's blood pressure, pulse, and oxygen                         saturations were monitored continuously. The Endoscope                         was introduced through the mouth, and advanced to the                         second part of duodenum. The upper GI endoscopy was  accomplished without difficulty. The patient tolerated                         the procedure well. Findings:      Patchy, white plaques were found in the middle third of the esophagus.       Brushings for KOH prep were obtained in the middle third of the       esophagus. Estimated blood loss: none.      A small hiatal hernia was present.      The exam of the esophagus was otherwise normal.      The entire examined stomach was normal.      The examined duodenum was normal. Impression:            - Esophageal plaques were found, suspicious for                         candidiasis. Brushings performed.                        - Small hiatal  hernia.                        - Normal stomach.                        - Normal examined duodenum. Recommendation:        - Await pathology results (KOH brushings).                        - Perform a colonoscopy today. Procedure Code(s):     --- Professional ---                        [phone], Esophagogastroduodenoscopy, flexible,                         transoral; diagnostic, including collection of                         specimen(s) by brushing or washing, when performed                         (separate procedure) Diagnosis Code(s):     --- Professional ---                        K22.9, Disease of esophagus, unspecified                        K44.9, Diaphragmatic hernia without obstruction or                         gangrene                        K92.1, Melena (includes Hematochezia) CPT copyright 2022 American Medical Association. All rights reserved. The codes documented in this report are preliminary and upon coder review may  be revised to meet current compliance requirements. Andrey Farmer MD, MD 12/16/2022 2:26:54 PM Number of Addenda: 0 Note Initiated On: 12/16/2022 1:29 PM Estimated Blood Loss:  Estimated blood loss: none.      [facility]

## 2022-12-16 NOTE — TOC Initial Note (Signed)
Transition of Care [facility]) - Initial/Assessment Note    Patient Details  Name: Jesse Alvarado MRN: [id number] Date of Birth: 26-Feb-1966  Transition of Care [facility]) CM/SW Contact:    Beverly Sessions, RN Phone Number: 12/16/2022, 7:35 PM  Clinical Narrative:                  Transition of Care [facility]) Screening Note   Patient Details  Name: Jesse Alvarado Date of Birth: 08-Mar-1966   Transition of Care [facility]) CM/SW Contact:    Beverly Sessions, RN Phone Number: 12/16/2022, 7:35 PM    Transition of Care Department [facility]) has reviewed patient and no TOC needs have been identified at this time. We will continue to monitor patient advancement through interdisciplinary progression rounds. If new patient transition needs arise, please place a TOC consult.          Patient Goals and CMS Choice            Expected Discharge Plan and Services                                              Prior Living Arrangements/Services                       Activities of Daily Living Home Assistive Devices/Equipment: CPAP ADL Screening (condition at time of admission) Patient's cognitive ability adequate to safely complete daily activities?: Yes Is the patient deaf or have difficulty hearing?: No Does the patient have difficulty seeing, even when wearing glasses/contacts?: No Does the patient have difficulty concentrating, remembering, or making decisions?: No Patient able to express need for assistance with ADLs?: Yes Does the patient have difficulty dressing or bathing?: No Independently performs ADLs?: Yes (appropriate for developmental age) Does the patient have difficulty walking or climbing stairs?: No Weakness of Legs: None Weakness of Arms/Hands: None  Permission Sought/Granted                  Emotional Assessment              Admission diagnosis:  Acute GI bleeding [K92.2] Gastrointestinal hemorrhage, unspecified gastrointestinal  hemorrhage type [K92.2] Patient Active Problem List   Diagnosis Date Noted   Acute GI bleeding 12/15/2022   Status post total hip replacement, left 10/28/2021   Dyslipidemia 08/24/2021   Status post colonoscopy 06/02/2019   Hepatic steatosis 06/02/2019   Barrett's esophagus without dysplasia 01/19/2019   High coronary artery calcium score 12/29/2018   Chronic iron deficiency anemia 12/22/2017   Gastritis without bleeding 06/25/2017   Vitamin B12 deficiency 08/03/2016   OSA on CPAP 04/18/2016   Nephrolithiasis 04/13/2016   Chronic midline low back pain without sciatica 01/25/2016   Primary osteoarthritis involving multiple joints 11/20/2015   Bilateral carpal tunnel syndrome 09/14/2015   Essential hypertension 09/14/2015   Chronic knee pain 08/30/2014   Erectile dysfunction 08/30/2014   Other allergic rhinitis 08/30/2014   Onychomycosis of toenail 07/24/2014   Hyperlipidemia associated with type 2 diabetes mellitus ([place]) 04/23/2014   Type II diabetes mellitus with complication ([place]) 34/74/2595   PCP:  Gladstone Lighter, MD Pharmacy:   [id number] [place], [place] [address] AT [address] [place] [address] Phone: [phone] Fax: [phone]     Social Determinants of Health ([place]) Social History:  SDOH Screenings   Food Insecurity: No Food Insecurity (12/15/2022)  Housing: Low Risk  (12/15/2022)  Transportation Needs: No Transportation Needs (12/15/2022)  Utilities: Not At Risk (12/15/2022)  Depression (PHQ2-9): Low Risk  (04/05/2020)  Tobacco Use: Low Risk  (12/15/2022)   SDOH Interventions:     Readmission Risk Interventions     No data to display

## 2022-12-16 NOTE — Anesthesia Preprocedure Evaluation (Signed)
Anesthesia Evaluation  Patient identified by MRN, date of birth, ID band Patient awake    Reviewed: Allergy & Precautions, NPO status , Patient's Chart, lab work & pertinent test results  Airway Mallampati: III  TM Distance: >3 FB Neck ROM: Full    Dental  (+) Teeth Intact   Pulmonary neg pulmonary ROS, asthma , sleep apnea and Continuous Positive Airway Pressure Ventilation    Pulmonary exam normal  + decreased breath sounds      Cardiovascular Exercise Tolerance: Good Pt. on medications + CAD  negative cardio ROS Normal cardiovascular exam Rhythm:Regular     Neuro/Psych negative neurological ROS  negative psych ROS   GI/Hepatic negative GI ROS, Neg liver ROS,GERD  Medicated,,  Endo/Other  negative endocrine ROSdiabetes, Well Controlled    Renal/GU negative Renal ROS  negative genitourinary   Musculoskeletal  (+) Arthritis ,    Abdominal Normal abdominal exam  (+)   Peds negative pediatric ROS (+)  Hematology negative hematology ROS (+)   Anesthesia Other Findings Past Medical History: No date: Anemia No date: Arthritis No date: Asthma     Comment:  sports induced asthma. takes inhalers when needed No date: Barrett's esophagus without dysplasia No date: [facility])     Comment:  Basal and squamoous cell No date: Chronic kidney disease No date: Complication of anesthesia     Comment:  high tolerance to pain medication. body absorbs pain med              quickly No date: Coronary artery disease No date: Cough on exercise No date: Diabetes mellitus without complication (HCC) No date: Diverticulosis No date: Erectile dysfunction No date: Fatty liver No date: GERD (gastroesophageal reflux disease) No date: History of kidney stones No date: Hyperlipidemia No date: Hypertension     Comment:  per patient, he does not have high bp but is treated for              his kidneys and his diabetes No date:  Pancreatitis No date: Right shoulder injury No date: Sleep apnea     Comment:  use C-PAP  Past Surgical History: No date: ANKLE GANGLION CYST EXCISION; Left     Comment:  x 5 10/03/2018: CARPAL TUNNEL RELEASE; Right     Comment:  Procedure: CARPAL TUNNEL RELEASE;  Surgeon: Earnestine Leys, MD;  Location: [facility];  Service: Orthopedics;                Laterality: Right; 10/24/2018: CARPAL TUNNEL RELEASE; Left     Comment:  Procedure: CARPAL TUNNEL RELEASE;  Surgeon: Earnestine Leys, MD;  Location: [facility];  Service: Orthopedics;                Laterality: Left; 05/10/2014: COLONOSCOPY 08/30/2019: COLONOSCOPY WITH PROPOFOL; N/A     Comment:  Procedure: COLONOSCOPY WITH PROPOFOL;  Surgeon: Tol[place],               Benay Pike, MD;  Location: [facility];  Service:               Gastroenterology;  Laterality: N/A; 06/05/2016: CYSTOSCOPY WITH STENT PLACEMENT; Bilateral     Comment:  Procedure: CYSTOSCOPY WITH STENT PLACEMENT;  Surgeon:               Nickie Retort, MD;  Location: [facility];  Service:               Urology;  Laterality: Bilateral; 04/13/2016: CYSTOSCOPY/URETEROSCOPY/HOLMIUM LASER/STENT PLACEMENT;  Left     Comment:  Procedure: CYSTOSCOPY/RETROGRADE PYELOGRAM/URETEROSCOPY               WITH HOLMIUM LASER LITHOTRIPSY//STENT PLACEMENT;                Surgeon: Festus Aloe, MD;  Location: [facility];                Service: Urology;  Laterality: Left; 12/27/2015: ESOPHAGOGASTRODUODENOSCOPY (EGD) WITH PROPOFOL; N/A     Comment:  Procedure: ESOPHAGOGASTRODUODENOSCOPY (EGD) WITH               PROPOFOL;  Surgeon: Manya Silvas, MD;  Location: [facility]              ENDOSCOPY;  Service: Endoscopy;  Laterality: N/A; 08/30/2019: ESOPHAGOGASTRODUODENOSCOPY (EGD) WITH PROPOFOL; N/A     Comment:  Procedure: ESOPHAGOGASTRODUODENOSCOPY (EGD) WITH               PROPOFOL;  Surgeon: Toledo, Benay Pike, MD;  Location:               [facility];  Service:  Gastroenterology;  Laterality:               N/A; 12/30/2017: EXTRACORPOREAL SHOCK WAVE LITHOTRIPSY; Right     Comment:  Procedure: EXTRACORPOREAL SHOCK WAVE LITHOTRIPSY (ESWL);              Surgeon: Royston Cowper, MD;  Location: [facility];                Service: Urology;  Laterality: Right; 03/19/2022: EXTRACORPOREAL SHOCK WAVE LITHOTRIPSY; Left     Comment:  Procedure: EXTRACORPOREAL SHOCK WAVE LITHOTRIPSY (ESWL);              Surgeon: Royston Cowper, MD;  Location: [facility];                Service: Urology;  Laterality: Left; 08/20/2022: EXTRACORPOREAL SHOCK WAVE LITHOTRIPSY; Right     Comment:  Procedure: EXTRACORPOREAL SHOCK WAVE LITHOTRIPSY (ESWL);              Surgeon: Royston Cowper, MD;  Location: [facility];                Service: Urology;  Laterality: Right; 08/12/2022: GANGLION CYST EXCISION; Right     Comment:  Procedure: EXCISION OF DORSAL CARPAL GANGLION OF RIGHT               WRIST;  Surgeon: Corky Mull, MD;  Location: [facility];               Service: Orthopedics;  Laterality: Right; 2007: HERNIA REPAIR     Comment:  umbilical 2542: KNEE ARTHROSCOPY; Right     Comment:  had bursa sack repaired 10/11/2020: KNEE ARTHROSCOPY WITH MEDIAL MENISECTOMY; Right     Comment:  Procedure: Right knee arthroscopy partial medial               meniscectomy;  Surgeon: Leim Fabry, MD;  Location:               [place];  Service: Orthopedics;  Laterality:               Right;  Diabetic - oral meds sleep apnea 11/18/2018: KNEE ARTHROSCOPY WITH MENISCAL REPAIR; Right     Comment:  Procedure: KNEE ARTHROSCOPY  WITH MENISCAL REPAIR AND               CHONDROPLASTY;  Surgeon: Leim Fabry, MD;  Location:               [place];  Service: Orthopedics;  Laterality:               Right;  Diabetic - oral meds sleep apnea SUPINE WITH               ACL LEG HOLDER SMITH AND NEWPHEW [place] 10/22/2022: REPAIR EXTENSOR TENDON; Right     Comment:  Procedure:  EXTENSOR TENOSYNOVECTOMY OF RIGHT WRIST;                Surgeon: Corky Mull, MD;  Location: [facility];                Service: Orthopedics;  Laterality: Right; 2011: SHOULDER SURGERY; Right     Comment:  tendon was shredded and was trimmed, repaired and               reattached. Screws in shoulder 10/28/2021: TOTAL HIP ARTHROPLASTY; Left     Comment:  Procedure: TOTAL HIP ARTHROPLASTY;  Surgeon: Corky Mull, MD;  Location: [facility];  Service: Orthopedics;                Laterality: Left; 06/05/2016: URETEROSCOPY WITH HOLMIUM LASER LITHOTRIPSY; Bilateral     Comment:  Procedure: URETEROSCOPY WITH HOLMIUM LASER LITHOTRIPSY;               Surgeon: Nickie Retort, MD;  Location: [facility];                Service: Urology;  Laterality: Bilateral; 2002: VASECTOMY  BMI    Body Mass Index: 25.68 kg/m      Reproductive/Obstetrics negative OB ROS                             Anesthesia Physical Anesthesia Plan  ASA: 3  Anesthesia Plan: General   Post-op Pain Management:    Induction: Intravenous  PONV Risk Score and Plan: Propofol infusion and TIVA  Airway Management Planned: Natural Airway  Additional Equipment:   Intra-op Plan:   Post-operative Plan:   Informed Consent: I have reviewed the patients History and Physical, chart, labs and discussed the procedure including the risks, benefits and alternatives for the proposed anesthesia with the patient or authorized representative who has indicated his/her understanding and acceptance.     Dental Advisory Given  Plan Discussed with: CRNA and Surgeon  Anesthesia Plan Comments:        Anesthesia Quick Evaluation

## 2022-12-16 NOTE — Transfer of Care (Signed)
Immediate Anesthesia Transfer of Care Note  Patient: Jesse Alvarado  Procedure(s) Performed: ESOPHAGOGASTRODUODENOSCOPY (EGD) COLONOSCOPY  Patient Location: PACU  Anesthesia Type:General  Level of Consciousness: drowsy and patient cooperative  Airway & Oxygen Therapy: Patient Spontanous Breathing and Patient connected to face mask oxygen  Post-op Assessment: Report given to RN and Post -op Vital signs reviewed and stable  Post vital signs: Reviewed and stable  Last Vitals:  Vitals Value Taken Time  BP 101/62 12/16/22 1422  Temp    Pulse    Resp    SpO2      Last Pain:  Vitals:   12/16/22 1312  TempSrc: Temporal  PainSc:          Complications: No notable events documented.

## 2022-12-16 NOTE — Anesthesia Postprocedure Evaluation (Signed)
Anesthesia Post Note  Patient: Jesse Alvarado  Procedure(s) Performed: ESOPHAGOGASTRODUODENOSCOPY (EGD) COLONOSCOPY  Patient location during evaluation: PACU Anesthesia Type: General Level of consciousness: oriented Pain management: satisfactory to patient Vital Signs Assessment: post-procedure vital signs reviewed and stable Respiratory status: spontaneous breathing and nonlabored ventilation Cardiovascular status: stable Anesthetic complications: no  No notable events documented.   Last Vitals:  Vitals:   12/16/22 1312 12/16/22 1422  BP: 117/82 101/62  Pulse:  89  Resp: 20 17  Temp: 36.6 C 36.6 C  SpO2: 100% 98%    Last Pain:  Vitals:   12/16/22 1432  TempSrc:   PainSc: 0-No pain                 VAN STAVEREN,Wandell Scullion

## 2022-12-17 ENCOUNTER — Observation Stay: Payer: Managed Care, Other (non HMO)

## 2022-12-17 ENCOUNTER — Encounter: Payer: Self-pay | Admitting: Gastroenterology

## 2022-12-17 DIAGNOSIS — K922 Gastrointestinal hemorrhage, unspecified: Secondary | ICD-10-CM | POA: Diagnosis not present

## 2022-12-17 LAB — BASIC METABOLIC PANEL
Anion gap: 7 (ref 5–15)
BUN: 15 mg/dL (ref 6–20)
CO2: 22 mmol/L (ref 22–32)
Calcium: 8.6 mg/dL — ABNORMAL LOW (ref 8.9–10.3)
Chloride: 108 mmol/L (ref 98–111)
Creatinine, Ser: 1.1 mg/dL (ref 0.61–1.24)
GFR, Estimated: >60 mL/min (ref >60)
Glucose, Bld: 170 mg/dL — ABNORMAL HIGH (ref 70–99)
Potassium: 4 mmol/L (ref 3.5–5.1)
Sodium: 137 mmol/L (ref 135–145)

## 2022-12-17 LAB — CBC WITH DIFFERENTIAL/PLATELET
Abs Immature Granulocytes: 0.02 10*3/uL (ref 0.00–0.07)
Basophils Absolute: 0 10*3/uL (ref 0.0–0.1)
Basophils Relative: 1 %
Eosinophils Absolute: 0.2 10*3/uL (ref 0.0–0.5)
Eosinophils Relative: 3 %
HCT: 31.5 % — ABNORMAL LOW (ref 39.0–52.0)
Hemoglobin: 10.2 g/dL — ABNORMAL LOW (ref 13.0–17.0)
Immature Granulocytes: 0 %
Lymphocytes Relative: 31 %
Lymphs Abs: 2 10*3/uL (ref 0.7–4.0)
MCH: 29.2 pg (ref 26.0–34.0)
MCHC: 32.4 g/dL (ref 30.0–36.0)
MCV: 90.3 fL (ref 80.0–100.0)
Monocytes Absolute: 0.6 10*3/uL (ref 0.1–1.0)
Monocytes Relative: 10 %
Neutro Abs: 3.6 10*3/uL (ref 1.7–7.7)
Neutrophils Relative %: 55 %
Platelets: 313 10*3/uL (ref 150–400)
RBC: 3.49 MIL/uL — ABNORMAL LOW (ref 4.22–5.81)
RDW: 14.9 % (ref 11.5–15.5)
WBC: 6.5 10*3/uL (ref 4.0–10.5)
nRBC: 0 % (ref 0.0–0.2)

## 2022-12-17 LAB — GLUCOSE, CAPILLARY: Glucose-Capillary: 124 mg/dL — ABNORMAL HIGH (ref 70–99)

## 2022-12-17 MED ORDER — BARIUM SULFATE 0.1 % PO SUSP
1350.0000 mL | Freq: Once | ORAL | Status: AC
  Administered 2022-12-17: 1350 mL via ORAL

## 2022-12-17 NOTE — Discharge Summary (Signed)
Physician Discharge Summary  Jesse Alvarado [id number] DOB: 08/29/1966 DOA: 12/15/2022  PCP: Gladstone Lighter, MD  Admit date: 12/15/2022 Discharge date: 12/18/2022  Admitted From: Home Discharge disposition: Home  Recommendations at discharge:  Follow-up with GI as an outpatient    Brief narrative: Jesse Alvarado is a 57 y.o. male with PMH significant for DM2, HTN, HLD, sleep apnea on CPAP, CAD, CKD, Barrett's esophagus, diverticulosis Patient presented to the ED with complaint of abdominal pain and BRBPR For the last 1 week, patient reports intermittent bloody stool described as brown stool with streaks of bright red blood.  He was seen in the ED on 2/3, noted to have stable hemoglobin and was discharged home to follow-up with GI as an outpatient.  He had an appointment made for sometime this week but his symptoms worsened and hence he presented to the ED again this morning.  Today, he also mentioned bilateral lower quadrant cramping abdominal pain.  Not on any blood thinners.  In the ED, patient afebrile, heart rate 104, blood pressure normal, breathing on room air Labs with hemoglobin 10.7, stable compared to 3 days ago. CT abdomen and pelvis with contrast did not show any acute findings, mass lesions or adenopathy but it showed stable descending and sigmoid colon diverticulosis without evidence of acute diverticulitis.   EDP spoke with GI on-call Dr. Haig Prophet who will evaluate the patient in the hospital Admitted to [facility] 2/7, underwent EGD and colonoscopy as below  Subjective: Patient was seen and examined this morning.  Lying on bed.  Not in distress on symptoms.  Underwent CT scan abdomen as discussed below. Wife at [facility] course: Acute lower GI bleeding History of Barrett's esophagus and diverticulosis Presented with 1 week history of abdominal pain, intermittent streaks of bright red blood mixed with brown stool.  Not on a blood thinner. GI consult  obtained. EGD 2/7 showed esophageal plaques suspicious for candidiasis, brushings performed. Colonoscopy 2/7 showed diverticulosis in the sigmoid colon, descending colon and ascending colon, nonbleeding internal hemorrhoids. No source of bleeding was identified in EGD or colonoscopy. 2/8, underwent CT enterography which showed colonic diverticulosis mainly in sigmoid colon with mild diverticular changes.  No evidence of active bleeding.  Chronic anemia Hemoglobin chronically low but above 10.  Remains stable. Continue to monitor. Continue PPI, iron, vitamin [id number], folic acid Recent Labs    12/12/22 1654 12/15/22 0637 12/15/22 0936 12/16/22 0424 12/17/22 0521  HGB 10.1* 10.7* 10.1* 10.4* 10.2*  MCV 90.9 91.3  --  90.0 90.3   Type 2 diabetes mellitus Hyperglycemia A1c controlled at 7 on 12/15/22 PTA on glimepiride, Januvia, metformin, Actos. Resume the same post discharge.  Patient follows up with an endocrinologist and has an upcoming appointment next week. Continue Neurontin for diabetic neuropathy Recent Labs  Lab 12/16/22 1157 12/16/22 1602 12/16/22 1638 12/16/22 2048 12/17/22 1059  GLUCAP 101* 76 104* 224* 124*   Hypertension Continue losartan  CAD/HLD Continue Lipitor, Zetia, fenofibrate  CKD 3a Creatinine remains at baseline Recent Labs    03/31/22 2100 08/04/22 1006 12/12/22 1255 12/12/22 1654 12/15/22 0637 12/16/22 0424 12/17/22 0521  BUN 30* 26* 22* 20 30* 17 15  CREATININE 1.25* 1.20 1.39* 1.45* 1.32* 1.14 1.10   Anxiety/depression Wellbutrin, Cymbalta  Sleep apnea  on CPAP  Nephrolithiasis CT abdomen showed a lower pole left renal calculus but no obstructing ureteral calculi or bladder calculi. 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  General exam: Pleasant, middle-aged Caucasian male.  Not in distress Skin: No rashes, lesions or ulcers. HEENT: Atraumatic, normocephalic, no obvious bleeding Lungs: Clear to auscultation bilaterally CVS: Regular rate and rhythm, no murmur GI/Abd soft, mild bilateral lower abdominal tenderness, nondistended, bowel sound present CNS: Alert, awake, oriented x 3 Psychiatry: Mood appropriate Extremities: No pedal edema, no calf tenderness  Follow ups:    Discharge Instructions:   Discharge Instructions     Call MD for:  difficulty breathing, headache or visual disturbances   Complete by: As directed    Call MD for:  extreme fatigue   Complete by: As directed    Call MD for:  hives   Complete by: As directed    Call MD for:  persistant dizziness or light-headedness   Complete by: As directed    Call MD for:  persistant nausea and vomiting   Complete by: As directed    Call MD for:  severe uncontrolled pain   Complete by: As directed    Call MD for:  temperature >100.4   Complete by: As directed    Diet general   Complete by: As directed    Discharge instructions   Complete by: As directed    General discharge instructions: Follow with Primary MD Gladstone Lighter, MD in 7 days  Please request your PCP  to go over your hospital tests, procedures, radiology results at the follow up. Please get your medicines reviewed and adjusted.  Your PCP may decide to repeat certain labs or tests as needed. Do not drive, operate heavy machinery, perform activities at heights, swimming or participation in water activities or provide baby sitting services if your  were admitted for syncope or siezures until you have seen by Primary MD or a Neurologist and advised to do so again. [place] Controlled Substance Reporting System database was reviewed. Do not drive, operate heavy machinery, perform activities at heights, swim, participate in water activities or provide baby-sitting services while on medications for pain, sleep and mood until your outpatient physician has reevaluated you and advised to do so again.  You are strongly recommended to comply with the dose, frequency and duration of prescribed medications. Activity: As tolerated with Full fall precautions use walker/cane & assistance as needed Avoid using any recreational substances like cigarette, tobacco, alcohol, or non-prescribed drug. If you experience worsening of your admission symptoms, develop shortness of breath, life threatening emergency, suicidal or homicidal thoughts you must seek medical attention immediately by calling 911 or calling your MD immediately  if symptoms less severe. You must read complete instructions/literature along with all the possible adverse reactions/side effects for all the medicines you take and that have been prescribed to you. Take any new medicine only after you have completely understood and accepted all the possible adverse reactions/side effects.  Wear Seat belts while  driving. You were cared for by a hospitalist during your hospital stay. If you have any questions about your discharge medications or the care you received while you were in the hospital after you are discharged, you can call the unit and ask to speak with the hospitalist or the covering physician. Once you are discharged, your primary care physician will handle any further medical issues. Please note that NO REFILLS for any discharge medications will be authorized once you are discharged, as it is imperative that you return to your primary care physician (or establish a relationship with a primary  care physician if you do not have one).   Increase activity slowly   Complete by: As directed        Discharge Medications:   Allergies as of 12/17/2022       Reactions   Dulaglutide Other (See Comments)   Trulicity- Caused pancreatitis    Monosodium Glutamate Diarrhea   MSG   Ciprofloxacin Other (See Comments)   GI upset        Medication List     STOP taking these medications    amoxicillin-clavulanate 875-125 MG tablet Commonly known as: AUGMENTIN   meloxicam 15 MG tablet Commonly known as: MOBIC       TAKE these medications    acetaminophen 500 MG tablet Commonly known as: TYLENOL Take 2 tablets (1,000 mg total) by mouth every 6 (six) hours.   albuterol 108 (90 Base) MCG/ACT inhaler Commonly known as: VENTOLIN HFA Inhale 2 puffs into the lungs every 6 (six) hours as needed for wheezing or shortness of breath.   atorvastatin 80 MG tablet Commonly known as: LIPITOR TAKE ONE TABLET BY MOUTH ONE TIME DAILY   azelastine 0.05 % ophthalmic solution Commonly known as: OPTIVAR Place 1 drop into both eyes daily as needed.   benzonatate 200 MG capsule Commonly known as: TESSALON Take 200 mg by mouth 3 (three) times daily as needed for cough.   buPROPion 150 MG 24 hr tablet Commonly known as: WELLBUTRIN XL Take 150 mg by mouth daily.   buPROPion 300 MG 24 hr tablet Commonly known as: WELLBUTRIN XL Take 300 mg by mouth daily.   CALCIUM CITRATE PO Take 500 mg by mouth 3 (three) times daily.   ciclopirox 8 % solution Commonly known as: PENLAC Apply 1 application topically at bedtime. Apply over nail and surrounding skin. Apply daily over previous coat. After seven (7) days, may remove with alcohol and continue cycle.   cyanocobalamin 1000 MCG/ML injection Commonly known as: VITAMIN B12 Inject into the muscle.   DULoxetine 30 MG capsule Commonly known as: CYMBALTA Take 60 mg by mouth daily.   Dymista 137-50 MCG/ACT Susp Generic drug:  Azelastine-Fluticasone Place 1 spray into both nostrils 2 (two) times daily.   ezetimibe 10 MG tablet Commonly known as: ZETIA Take 1 tablet (10 mg total) by mouth daily.   fenofibrate 145 MG tablet Commonly known as: TRICOR Take 1 tablet by mouth daily.   ferrous gluconate 324 MG tablet Commonly known as: FERGON Take 324 mg by mouth 3 (three) times daily with meals.   folic acid 1 MG tablet Commonly known as: FOLVITE TAKE 1 TABLET(1 MG) BY MOUTH DAILY   FreeStyle Libre 2 Sensor Misc USE 1 KIT EVERY 14 DAYS FOR GLUCOSE MONITORING   gabapentin 300 MG capsule Commonly known as: NEURONTIN Take 300 mg by mouth 3 (three) times daily. For chronic cough/laryngeal inflammation   glimepiride 2 MG tablet Commonly known as: AMARYL  Take 1 tablet (2 mg total) by mouth 2 (two) times daily. Take 59m with breakfast, 2 mg with supper   HYDROcodone-acetaminophen 5-325 MG tablet Commonly known as: NORCO/VICODIN Take 1-2 tablets by mouth every 6 (six) hours as needed for moderate pain or severe pain.   icosapent Ethyl 1 g capsule Commonly known as: Vascepa Take 2 capsules (2 g total) by mouth 2 (two) times daily.   ipratropium 0.06 % nasal spray Commonly known as: ATROVENT Place 2 sprays into both nostrils 2 (two) times daily.   Januvia 100 MG tablet Generic drug: sitaGLIPtin Take 100 mg by mouth daily.   ketoconazole 2 % shampoo Commonly known as: NIZORAL   Lagevrio 200 MG Caps capsule Generic drug: molnupiravir EUA Take 4 capsules by mouth 2 (two) times daily.   levocetirizine 5 MG tablet Commonly known as: XYZAL Take 5 mg by mouth every evening.   losartan 50 MG tablet Commonly known as: COZAAR Take 50 mg by mouth every morning.   metFORMIN 850 MG tablet Commonly known as: GLUCOPHAGE TAKE 1 TABLET(850 MG) BY MOUTH THREE TIMES DAILY WITH MEALS   pantoprazole 40 MG tablet Commonly known as: Protonix Take 1 tablet (40 mg total) by mouth daily.   pioglitazone 30 MG  tablet Commonly known as: ACTOS Take 30 mg by mouth daily.   pioglitazone 15 MG tablet Commonly known as: ACTOS Take 15 mg by mouth daily.   Precision QID Test test strip Generic drug: glucose blood Use once daily Use as instructed.   RA Cranberry 500 MG Caps Generic drug: Cranberry Take by mouth.   silodosin 4 MG Caps capsule Commonly known as: RAPAFLO Take 8 mg by mouth every evening.   sucralfate 1 g tablet Commonly known as: CARAFATE TAKE 1 TABLET(1 GRAM) BY MOUTH THREE TIMES DAILY WITH MEALS   tadalafil 5 MG tablet Commonly known as: CIALIS Take 5 mg by mouth daily.   tadalafil 20 MG tablet Commonly known as: CIALIS Take 5 mg by mouth daily.   tamsulosin 0.4 MG Caps capsule Commonly known as: FLOMAX Take 1 capsule (0.4 mg total) by mouth daily.   tiZANidine 2 MG tablet Commonly known as: ZANAFLEX Take 2 mg by mouth at bedtime as needed for muscle spasms.         The results of significant diagnostics from this hospitalization (including imaging, microbiology, ancillary and laboratory) are listed below for reference.    Procedures and Diagnostic Studies:   CT Abdomen Pelvis W Contrast  Result Date: 12/15/2022 CLINICAL DATA:  Abdominal pain and rectal bleeding for 3 days. EXAM: CT ABDOMEN AND PELVIS WITH CONTRAST TECHNIQUE: Multidetector CT imaging of the abdomen and pelvis was performed using the standard protocol following bolus administration of intravenous contrast. RADIATION DOSE REDUCTION: This exam was performed according to the departmental dose-optimization program which includes automated exposure control, adjustment of the mA and/or kV according to patient size and/or use of iterative reconstruction technique. CONTRAST:  745mOMNIPAQUE IOHEXOL 350 MG/ML SOLN COMPARISON:  04/01/2022 FINDINGS: Lower chest: Minimal dependent subpleural atelectasis but no infiltrates or effusions. The heart is normal in size. No pericardial effusion. Hepatobiliary: No  hepatic lesions or intrahepatic biliary dilatation. The gallbladder is unremarkable. No common bile duct dilatation. Pancreas: No mass, inflammation or ductal dilatation. Spleen: Normal size.  No focal lesions. Adrenals/Urinary Tract: The adrenal glands are normal. Mild renal cortical scarring changes. Lower pole left renal calculus. No obstructing ureteral calculi or bladder calculi. No worrisome renal lesions. The bladder is partially  obscured by artifact from the left hip prosthesis but there appears to be mild chronic uniform bladder wall thickening. Stomach/Bowel: The stomach, duodenum and small bowel are unremarkable. No acute inflammatory process, mass lesions or obstructive findings. The terminal ileum and appendix are normal. Stable descending and sigmoid colon diverticulosis no findings for acute diverticulitis. Vascular/Lymphatic: The aorta is normal in caliber. No dissection. The branch vessels are patent. The major venous structures are patent. No mesenteric or retroperitoneal mass or adenopathy. Small scattered lymph nodes are noted. Reproductive: The prostate gland and seminal vesicles are grossly normal. Other: No pelvic mass or adenopathy. No free pelvic fluid collections. No inguinal mass or adenopathy. No abdominal wall hernia or subcutaneous lesions. Musculoskeletal: No significant bony findings. 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 IMPRESSION: 1. No acute abdominal/pelvic findings, mass lesions or adenopathy. 2. Stable descending and sigmoid colon diverticulosis without findings for acute diverticulitis. 3. Lower pole left renal calculus but no obstructing ureteral calculi or bladder calculi. 4. 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 Electronically Signed   By: Marijo Sanes M.D.   On: 12/15/2022 08:19     Labs:   Basic Metabolic Panel: Recent Labs  Lab 12/12/22 1255 12/12/22 1654 12/15/22 0637 12/16/22 0424  12/17/22 0521  NA 135 137 139 138 137  K 3.9 4.0 4.6 4.4 4.0  CL 104 112* 108 106 108  CO2 20* 18* 22 24 22  $ GLUCOSE 162* 195* 129* 129* 170*  BUN 22* 20 30* 17 15  CREATININE 1.39* 1.45* 1.32* 1.14 1.10  CALCIUM 9.2 8.1* 9.6 9.0 8.6*   GFR Estimated Creatinine Clearance: 75.6 mL/min (by C-G formula based on SCr of 1.1 mg/dL). Liver Function Tests: Recent Labs  Lab 12/12/22 1255 12/15/22 0637  AST 27 26  ALT 17 17  ALKPHOS 33* 35*  BILITOT 0.8 0.6  PROT 7.3 6.9  ALBUMIN 4.3 4.1   Recent Labs  Lab 12/15/22 0637  LIPASE 46   No results for input(s): "AMMONIA" in the last 168 hours. Coagulation profile No results for input(s): "INR", "PROTIME" in the last 168 hours.  CBC: Recent Labs  Lab 12/12/22 1255 12/12/22 1654 12/15/22 0637 12/15/22 0936 12/16/22 0424 12/17/22 0521  WBC 11.3* 6.8 8.2  --  8.0 6.5  NEUTROABS  --   --  5.3  --   --  3.6  HGB 11.1* 10.1* 10.7* 10.1* 10.4* 10.2*  HCT 34.0* 31.0* 33.7* 31.0* 31.5* 31.5*  MCV 89.7 90.9 91.3  --  90.0 90.3  PLT 353 312 337  --  322 313   Cardiac Enzymes: No results for input(s): "CKTOTAL", "CKMB", "CKMBINDEX", "TROPONINI" in the last 168 hours. BNP: Invalid input(s): "POCBNP" CBG: Recent Labs  Lab 12/16/22 1157 12/16/22 1602 12/16/22 1638 12/16/22 2048 12/17/22 1059  GLUCAP 101* 76 104* 224* 124*   D-Dimer No results for input(s): "DDIMER" in the last 72 hours. Hgb A1c No results for input(s): "HGBA1C" in the last 72 hours. Lipid Profile No results for input(s): "CHOL", "HDL", "LDLCALC", "TRIG", "CHOLHDL", "LDLDIRECT" in the last 72 hours. Thyroid function studies No results for input(s): "TSH", "T4TOTAL", "T3FREE", "THYROIDAB" in the last 72 hours.  Invalid input(s): "FREET3" Anemia work up No results for input(s): "VITAMINB12", "FOLATE", "FERRITIN", "TIBC", "IRON", "RETICCTPCT" in the last 72 hours. Microbiology Recent Results (from the past 240 hour(s))  KOH prep     Status: None    Collection Time: 12/16/22  2:03 PM   Specimen:  Esophagus  Result Value Ref Range Status   Specimen Description ESOPHAGUS  Final   Special Requests KOH BRUSHING  Final   KOH Prep   Final    BUDDING YEAST SEEN Performed at [facility], [address] [place], [place] [address]    Report Status 12/16/2022 FINAL  Final    Time coordinating discharge: 35 minutes  Signed: Derryck Shahan  [facility] 12/18/2022, 1:48 PM     Continue the same at discharge.  Continue the same at discharge

## 2022-12-18 ENCOUNTER — Ambulatory Visit: Payer: Managed Care, Other (non HMO) | Admitting: Occupational Therapy

## 2022-12-18 DIAGNOSIS — M6281 Muscle weakness (generalized): Secondary | ICD-10-CM

## 2022-12-18 DIAGNOSIS — M25531 Pain in right wrist: Secondary | ICD-10-CM

## 2022-12-18 DIAGNOSIS — L905 Scar conditions and fibrosis of skin: Secondary | ICD-10-CM

## 2022-12-18 DIAGNOSIS — M25631 Stiffness of right wrist, not elsewhere classified: Secondary | ICD-10-CM

## 2022-12-18 NOTE — Therapy (Signed)
[facility] [address], [place], [address] Phone: [phone]   Fax:  [phone]  Occupational Therapy Treatment  Patient Details  Name: Jesse Alvarado MRN: [id number] Date of Birth: Jun 13, 1966 Referring Provider (OT): [place] PA   Encounter Date: 12/18/2022   OT End of Session - 12/18/22 0854     Visit Number 7    Number of Visits 12    Date for OT Re-Evaluation 01/08/23    OT Start Time 0833    OT Stop Time 0919    OT Time Calculation (min) 46 min    Activity Tolerance Patient tolerated treatment well    Behavior During Therapy [facility] for tasks assessed/performed             Past Medical History:  Diagnosis Date   Anemia    Arthritis    Asthma    sports induced asthma. takes inhalers when needed   Barrett's esophagus without dysplasia    Cancer (HCC)    Basal and squamoous cell   Chronic kidney disease    Complication of anesthesia    high tolerance to pain medication. body absorbs pain med quickly   Coronary artery disease    Cough on exercise    Diabetes mellitus without complication ([place])    Diverticulosis    Erectile dysfunction    Fatty liver    GERD (gastroesophageal reflux disease)    History of kidney stones    Hyperlipidemia    Hypertension    per patient, he does not have high bp but is treated for his kidneys and his diabetes   Pancreatitis    Right shoulder injury    Sleep apnea    use C-PAP    Past Surgical History:  Procedure Laterality Date   ANKLE GANGLION CYST EXCISION Left    x 5   CARPAL TUNNEL RELEASE Right 10/03/2018   Procedure: CARPAL TUNNEL RELEASE;  Surgeon: Earnestine Leys, MD;  Location: [facility];  Service: Orthopedics;  Laterality: Right;   CARPAL TUNNEL RELEASE Left 10/24/2018   Procedure: CARPAL TUNNEL RELEASE;  Surgeon: Earnestine Leys, MD;  Location: [facility];  Service: Orthopedics;  Laterality: Left;   COLONOSCOPY  05/10/2014   COLONOSCOPY N/A 12/16/2022    Procedure: COLONOSCOPY;  Surgeon: Lesly Rubenstein, MD;  Location: [facility] ENDOSCOPY;  Service: Endoscopy;  Laterality: N/A;   COLONOSCOPY WITH PROPOFOL N/A 08/30/2019   Procedure: COLONOSCOPY WITH PROPOFOL;  Surgeon: Toledo, Benay Pike, MD;  Location: [facility] ENDOSCOPY;  Service: Gastroenterology;  Laterality: N/A;   CYSTOSCOPY WITH STENT PLACEMENT Bilateral 06/05/2016   Procedure: CYSTOSCOPY WITH STENT PLACEMENT;  Surgeon: Nickie Retort, MD;  Location: [facility];  Service: Urology;  Laterality: Bilateral;   CYSTOSCOPY/URETEROSCOPY/HOLMIUM LASER/STENT PLACEMENT Left 04/13/2016   Procedure: CYSTOSCOPY/RETROGRADE PYELOGRAM/URETEROSCOPY WITH HOLMIUM LASER LITHOTRIPSY//STENT PLACEMENT;  Surgeon: Festus Aloe, MD;  Location: [facility];  Service: Urology;  Laterality: Left;   ESOPHAGOGASTRODUODENOSCOPY N/A 12/16/2022   Procedure: ESOPHAGOGASTRODUODENOSCOPY (EGD);  Surgeon: Lesly Rubenstein, MD;  Location: [facility] END[facility]COPY;  Service: Endoscopy;  Laterality: N/A;   ESOPHAGOGASTRODUODENOSCOPY (EGD) WITH PROPOFOL N/A 12/27/2015   Procedure: ESOPHAGOGASTRODUODENOSCOPY (EGD) WITH PROPOFOL;  Surgeon: Manya Silvas, MD;  Location: [facility] ENDOSCOPY;  Service: Endoscopy;  Laterality: N/A;   ESOPHAGOGASTRODUODENOSCOPY (EGD) WITH PROPOFOL N/A 08/30/2019   Procedure: ESOPHAGOGASTRODUODENOSCOPY (EGD) WITH PROPOFOL;  Surgeon: Toledo, Benay Pike, MD;  Location: [facility] ENDOSCOPY;  Service: Gastroenterology;  Laterality: N/A;   EXTRACORPOREAL SHOCK WAVE LITHOTRIPSY Right 12/30/2017   Procedure: EXTRACORPOREAL  SHOCK WAVE LITHOTRIPSY (ESWL);  Surgeon: Royston Cowper, MD;  Location: [facility];  Service: Urology;  Laterality: Right;   EXTRACORPOREAL SHOCK WAVE LITHOTRIPSY Left 03/19/2022   Procedure: EXTRACORPOREAL SHOCK WAVE LITHOTRIPSY (ESWL);  Surgeon: Royston Cowper, MD;  Location: [facility];  Service: Urology;  Laterality: Left;   EXTRACORPOREAL SHOCK WAVE LITHOTRIPSY Right 08/20/2022   Procedure: EXTRACORPOREAL SHOCK  WAVE LITHOTRIPSY (ESWL);  Surgeon: Royston Cowper, MD;  Location: [facility];  Service: Urology;  Laterality: Right;   GANGLION CYST EXCISION Right 08/12/2022   Procedure: EXCISION OF DORSAL CARPAL GANGLION OF RIGHT WRIST;  Surgeon: Corky Mull, MD;  Location: [facility];  Service: Orthopedics;  Laterality: Right;   HERNIA REPAIR  [id number]   umbilical   KNEE ARTHROSCOPY Right 2015   had bursa sack repaired   KNEE ARTHROSCOPY WITH MEDIAL MENISECTOMY Right 10/11/2020   Procedure: Right knee arthroscopy partial medial meniscectomy;  Surgeon: Leim Fabry, MD;  Location: [facility];  Service: Orthopedics;  Laterality: Right;  Diabetic - oral meds sleep apnea   KNEE ARTHROSCOPY WITH MENISCAL REPAIR Right 11/18/2018   Procedure: KNEE ARTHROSCOPY WITH MENISCAL REPAIR AND CHONDROPLASTY;  Surgeon: Leim Fabry, MD;  Location: [facility];  Service: Orthopedics;  Laterality: Right;  Diabetic - oral meds sleep apnea SUPINE WITH ACL LEG HOLDER SMITH AND NEWPHEW CETERIX   REPAIR EXTENSOR TENDON Right 10/22/2022   Procedure: EXTENSOR TENOSYNOVECTOMY OF RIGHT WRIST;  Surgeon: Corky Mull, MD;  Location: [facility];  Service: Orthopedics;  Laterality: Right;   SHOULDER SURGERY Right 2011   tendon was shredded and was trimmed, repaired and reattached. Screws in shoulder   TOTAL HIP ARTHROPLASTY Left 10/28/2021   Procedure: TOTAL HIP ARTHROPLASTY;  Surgeon: Corky Mull, MD;  Location: [facility];  Service: Orthopedics;  Laterality: Left;   URETEROSCOPY WITH HOLMIUM LASER LITHOTRIPSY Bilateral 06/05/2016   Procedure: URETEROSCOPY WITH HOLMIUM LASER LITHOTRIPSY;  Surgeon: Nickie Retort, MD;  Location: [facility];  Service: Urology;  Laterality: Bilateral;   VASECTOMY  2002    There were no vitals filed for this visit.   Subjective Assessment - 12/18/22 0852     Subjective  I was for 3 days in hospital - had to go tthe ED after last visit and they kept me and did test -but could not find  anything - so did not do alot of wrist exercises    Pertinent History 11/20/22 Ortho note : PEDRAM CASTOR is a 57 y.o. male who presents today for a repeat skin check status post a revision extensive tenosynovectomy involving the dorsal aspect of the right wrist. The patient is now 4 weeks status post surgery, surgery was performed by Dr. Roland Rack on 10/22/2022. At his initial postop visit the patient was instructed to continue to wear the Velcro wrist splint routinely except for showers and continue with the Ace wrap applied to the right wrist to minimize the amount of swelling along the dorsal aspect of the right wrist. The patient denies any trauma or injury affecting right wrist since his procedure. He denies any signs of infection at home such as fevers chills or any drainage from the dorsal aspect of the right wrist. The patient has not noticed any significant focal swelling like he did after his initial tenosynovectomy. The patient has been out of work since the surgery. He does still have swelling noted in his fingers but states that this is improving at this time. The patient reports a 2 out  of 10 pain score in the right upper extremity at today's visit. He is not taking any medication consistently for the right wrist at this time. REfer to OT    Patient Stated Goals Want my motion and strength back in my R wrist so I can go back to work    Currently in Pain? No/denies                [facility] OT Assessment - 12/18/22 0001       AROM   Right Wrist Extension 50 Degrees    Right Wrist Flexion 55 Degrees      Strength   Right Hand Grip (lbs) 76    Right Hand Lateral Pinch 25 lbs    Right Hand 3 Point Pinch 24 lbs    Left Hand Grip (lbs) 85    Left Hand Lateral Pinch 22 lbs    Left Hand 3 Point Pinch 17 lbs                      OT Treatments/Exercises (OP) - 12/18/22 0001       RUE Paraffin   Number Minutes Paraffin 8 Minutes    RUE Paraffin Location Wrist    Comments 2  flexion./ extention 2 min each            Done some soft tissue mobs to dorsal wrist - as well as some gentle traction and joint mobs to wrist - as well as some light carpal rolls in weightbearing  Prior to wrist ext ,and flexion stretches and table slides     Great progress after paraffin and soft tissue    Patient to make sure he does not compensate with long extensors with wrist extension. Cont at home table slides for wrist extention pain free 20 reps  CPM for wrist extention 200 sec and flexion 200 sec Followed by    2 lbs  wrist and forearm in all planes - 15 reps  1 set of 12-15 at home  Great progress in AROM  2 x day after his motion      No splint but Benik neoprene during day with heavy activities  Encourage pt to start doing more tasks around the house - light and pain less than 2/10  Kinesiotape done - star over dorsal scar - where 2 scars cross - for scar mobs during wrist extention and decrease tenderness or discomfort  Ed on wearing and precautions         OT Education - 12/18/22 0854     Education Details progress and changes  HEP    Person(s) Educated Patient    Methods Explanation;Demonstration;Tactile cues;Verbal cues;Handout    Comprehension Verbalized understanding;Returned demonstration;Verbal cues required              OT Short Term Goals - 11/27/22 1631       OT SHORT TERM GOAL #1   Title Patient to be independent in home program to decrease scar tissue and edema and increase wrist flexion extension.    Baseline No knowledge of home program.  Wrist extension 35, flat 25 to wrist.    Time 3    Period Weeks    Status New    Target Date 12/18/22               OT Long Term Goals - 11/27/22 1631       OT LONG TERM GOAL #1   Title Right wrist flexion extension increased  to within functional limits for patient to push and pull heavy door, twist and turn knobs without increase symptoms    Baseline Right wrist extension 35 degrees,  and 25 degrees.  Pain with functional use can increase to 3/10.  With sometimes shooting or sharp pain 7/10.    Time 6    Period Weeks    Status New    Target Date 01/08/23      OT LONG TERM GOAL #2   Title Strength in the right wrist increased to 5/5 for patient to return back to work without increase symptoms    Baseline Patient 5 weeks postop flexion 25, extension 35 degrees.  Pain 3-7/10.  No strengthening yet.    Time 6    Period Weeks    Status New    Target Date 01/08/23      OT LONG TERM GOAL #3   Title Right grip and prehension strength increased to more than 75% compared to the left with increased symptoms to return back to work.    Baseline Not tested patient 5 weeks postop limited flexion extension of the wrist to 25-35 degrees.  In 3- 7/10 at rest    Time 6    Period Weeks    Status New    Target Date 01/08/23                   Plan - 12/18/22 0854     Clinical Impression Statement Patient  post right dorsal wrist tenosynovectomy 10/22/22.  Patient had a cyst removed in October 23.  Patient present at eval with decreased right dominant wrist flexion/ extension, increased scar tissue increased pain with some edema in hand/ wrist.  Patient limited in functional use of right dominant hand in ADLs and IADLs as well as to return to work. Pt making great progress in edema and pain -as well as increase flexion , ext of wrist every  session. Tenderness  where 2 scars cross with wrist extention/?- nerve pain over sensory ulnar branch  on dorsal hand improving. Pt was in hospital with some GI issues -and did not do 2 lbs weight. Initiate this date 2 lbs  for wrist all planes with no issues. Pt to cont using paraffin at home with stretches. Patient can benefit from skilled OT services to increase motion, increase scar tissue and pain increase strength to return back to prior level of function.    OT Occupational Profile and History Problem Focused Assessment - Including review  of records relating to presenting problem    Occupational performance deficits (Please refer to evaluation for details): ADL's;IADL's;Work;Social Participation;Leisure;Play    Body Structure / Function / Physical Skills ADL;Scar mobility;UE functional use;Flexibility;IADL;Pain;Strength;Edema;ROM    Rehab Potential Good    Clinical Decision Making Limited treatment options, no task modification necessary    Comorbidities Affecting Occupational Performance: None    Modification or Assistance to Complete Evaluation  No modification of tasks or assist necessary to complete eval    OT Frequency 2x / week    OT Duration 6 weeks    OT Treatment/Interventions Self-care/ADL training;Moist Heat;Paraffin;Fluidtherapy;Contrast Bath;Passive range of motion;Therapeutic activities;Splinting;Scar mobilization;Manual Therapy;Therapeutic exercise;Patient/family education    Consulted and Agree with Plan of Care Patient             Patient will benefit from skilled therapeutic intervention in order to improve the following deficits and impairments:   Body Structure / Function / Physical Skills: ADL, Scar mobility, UE functional use, Flexibility, IADL, Pain,  Strength, Edema, ROM       Visit Diagnosis: Pain in right wrist  Scar condition and fibrosis of skin  Muscle weakness (generalized)  Stiffness of right wrist, not elsewhere classified    Problem List Patient Active Problem List   Diagnosis Date Noted   Acute GI bleeding 12/15/2022   Status post total hip replacement, left 10/28/2021   Dyslipidemia 08/24/2021   Status post colonoscopy 06/02/2019   Hepatic steatosis 06/02/2019   Barrett's esophagus without dysplasia 01/19/2019   High coronary artery calcium score 12/29/2018   Chronic iron deficiency anemia 12/22/2017   Gastritis without bleeding 06/25/2017   Vitamin B12 deficiency 08/03/2016   OSA on CPAP 04/18/2016   Nephrolithiasis 04/13/2016   Chronic midline low back pain without  sciatica 01/25/2016   Primary osteoarthritis involving multiple joints 11/20/2015   Bilateral carpal tunnel syndrome 09/14/2015   Essential hypertension 09/14/2015   Chronic knee pain 08/30/2014   Erectile dysfunction 08/30/2014   Other allergic rhinitis 08/30/2014   Onychomycosis of toenail 07/24/2014   Hyperlipidemia associated with type 2 diabetes mellitus ([place]) 04/23/2014   Type II diabetes mellitus with complication ([place]) [id number]    Rosalyn Gess, OTR/L,CLT 12/18/2022, 11:06 AM  [facility] [address] [place], [address] Phone: [phone]   Fax:  [phone]  Name: KACEON LAPPE MRN: [id number] Date of Birth: [birth year]

## 2022-12-21 ENCOUNTER — Ambulatory Visit: Payer: Managed Care, Other (non HMO) | Admitting: Occupational Therapy

## 2022-12-21 DIAGNOSIS — M25531 Pain in right wrist: Secondary | ICD-10-CM | POA: Diagnosis not present

## 2022-12-21 DIAGNOSIS — M6281 Muscle weakness (generalized): Secondary | ICD-10-CM

## 2022-12-21 DIAGNOSIS — L905 Scar conditions and fibrosis of skin: Secondary | ICD-10-CM

## 2022-12-21 IMAGING — CR DG CHEST 2V
1 series · 2 of 2 positions shown · non-contrast
Comparison: [DATE] [DATE], [DATE], [DATE] [DATE], [DATE]

CLINICAL DATA: post op dyspnea and tachy. eval infiltrate

EXAM:
CHEST - 2 VIEW

[Series 1: dg chest 2 view · 0.14mm/px · 2 of 2 slices shown]
[im 1/2]
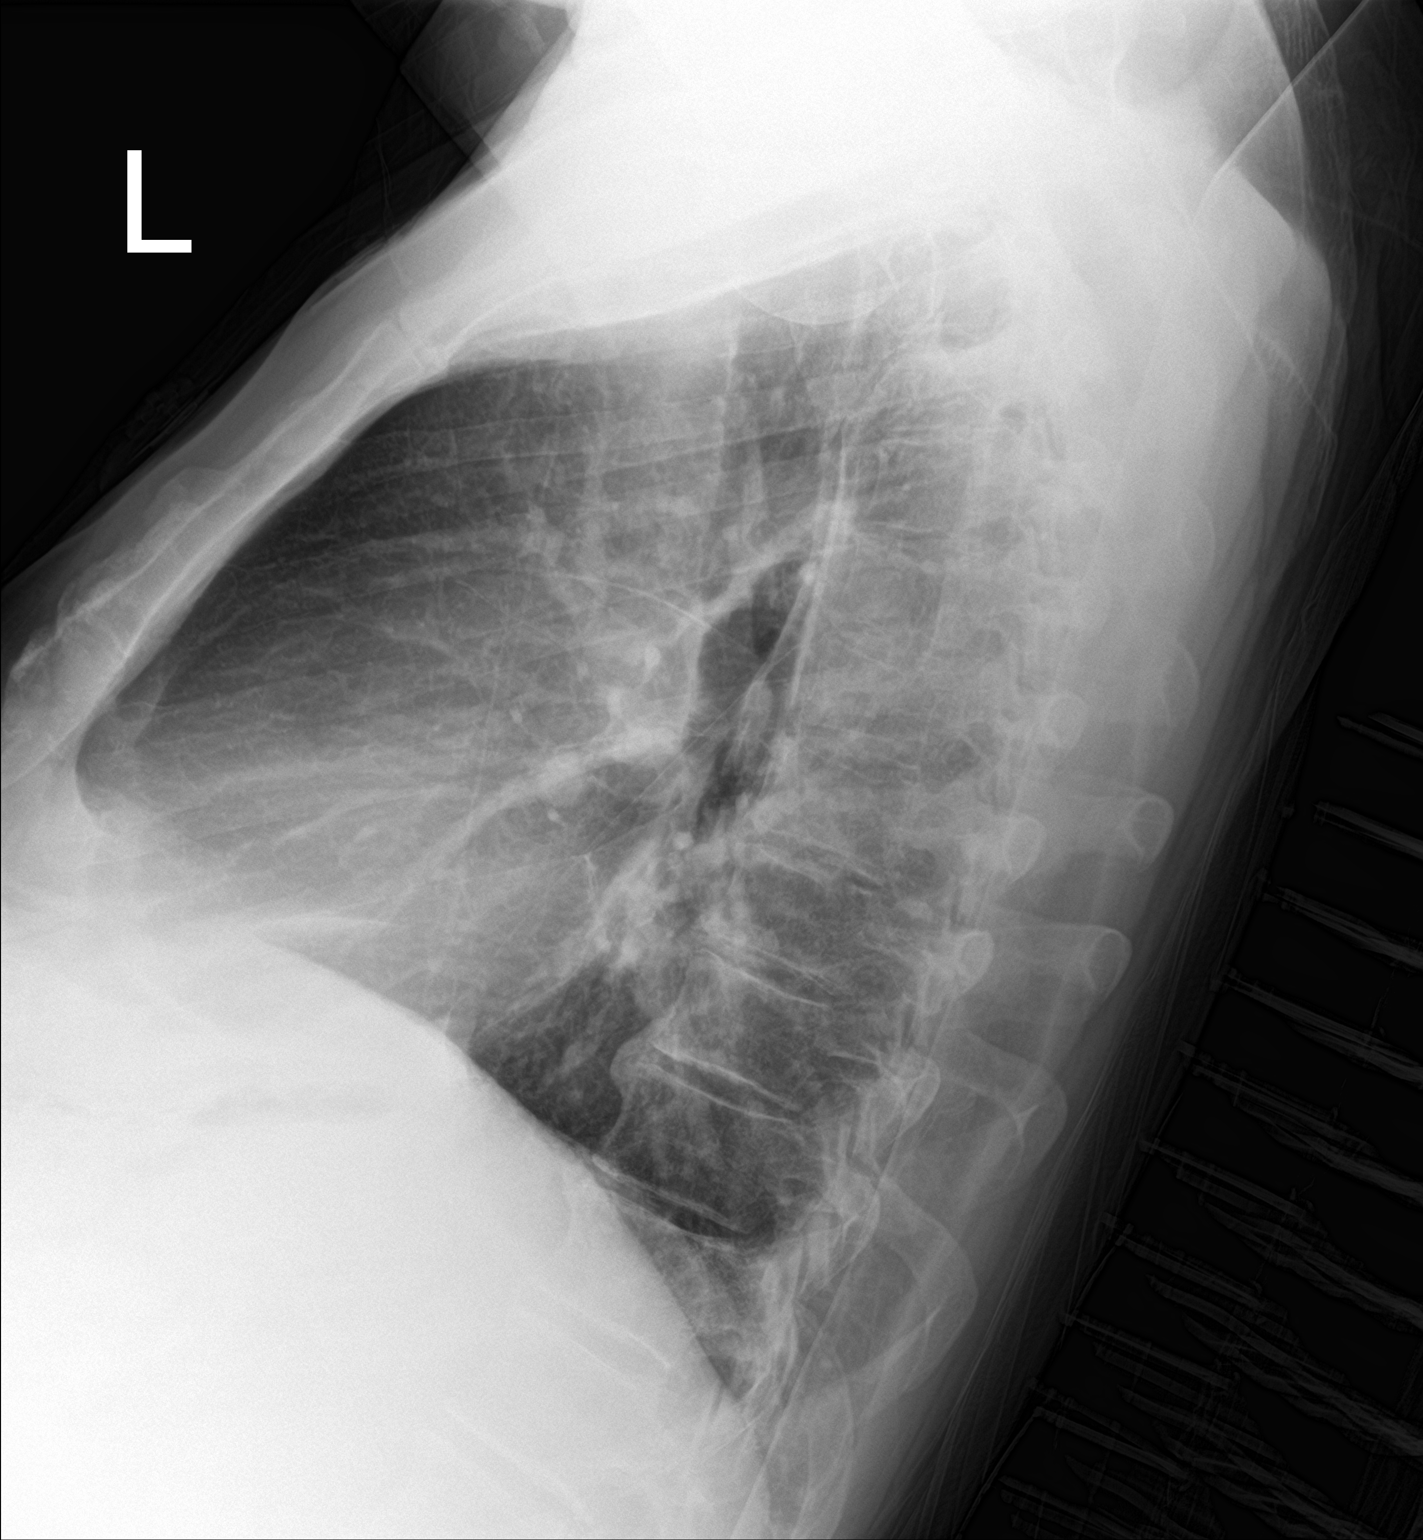
[im 2/2]
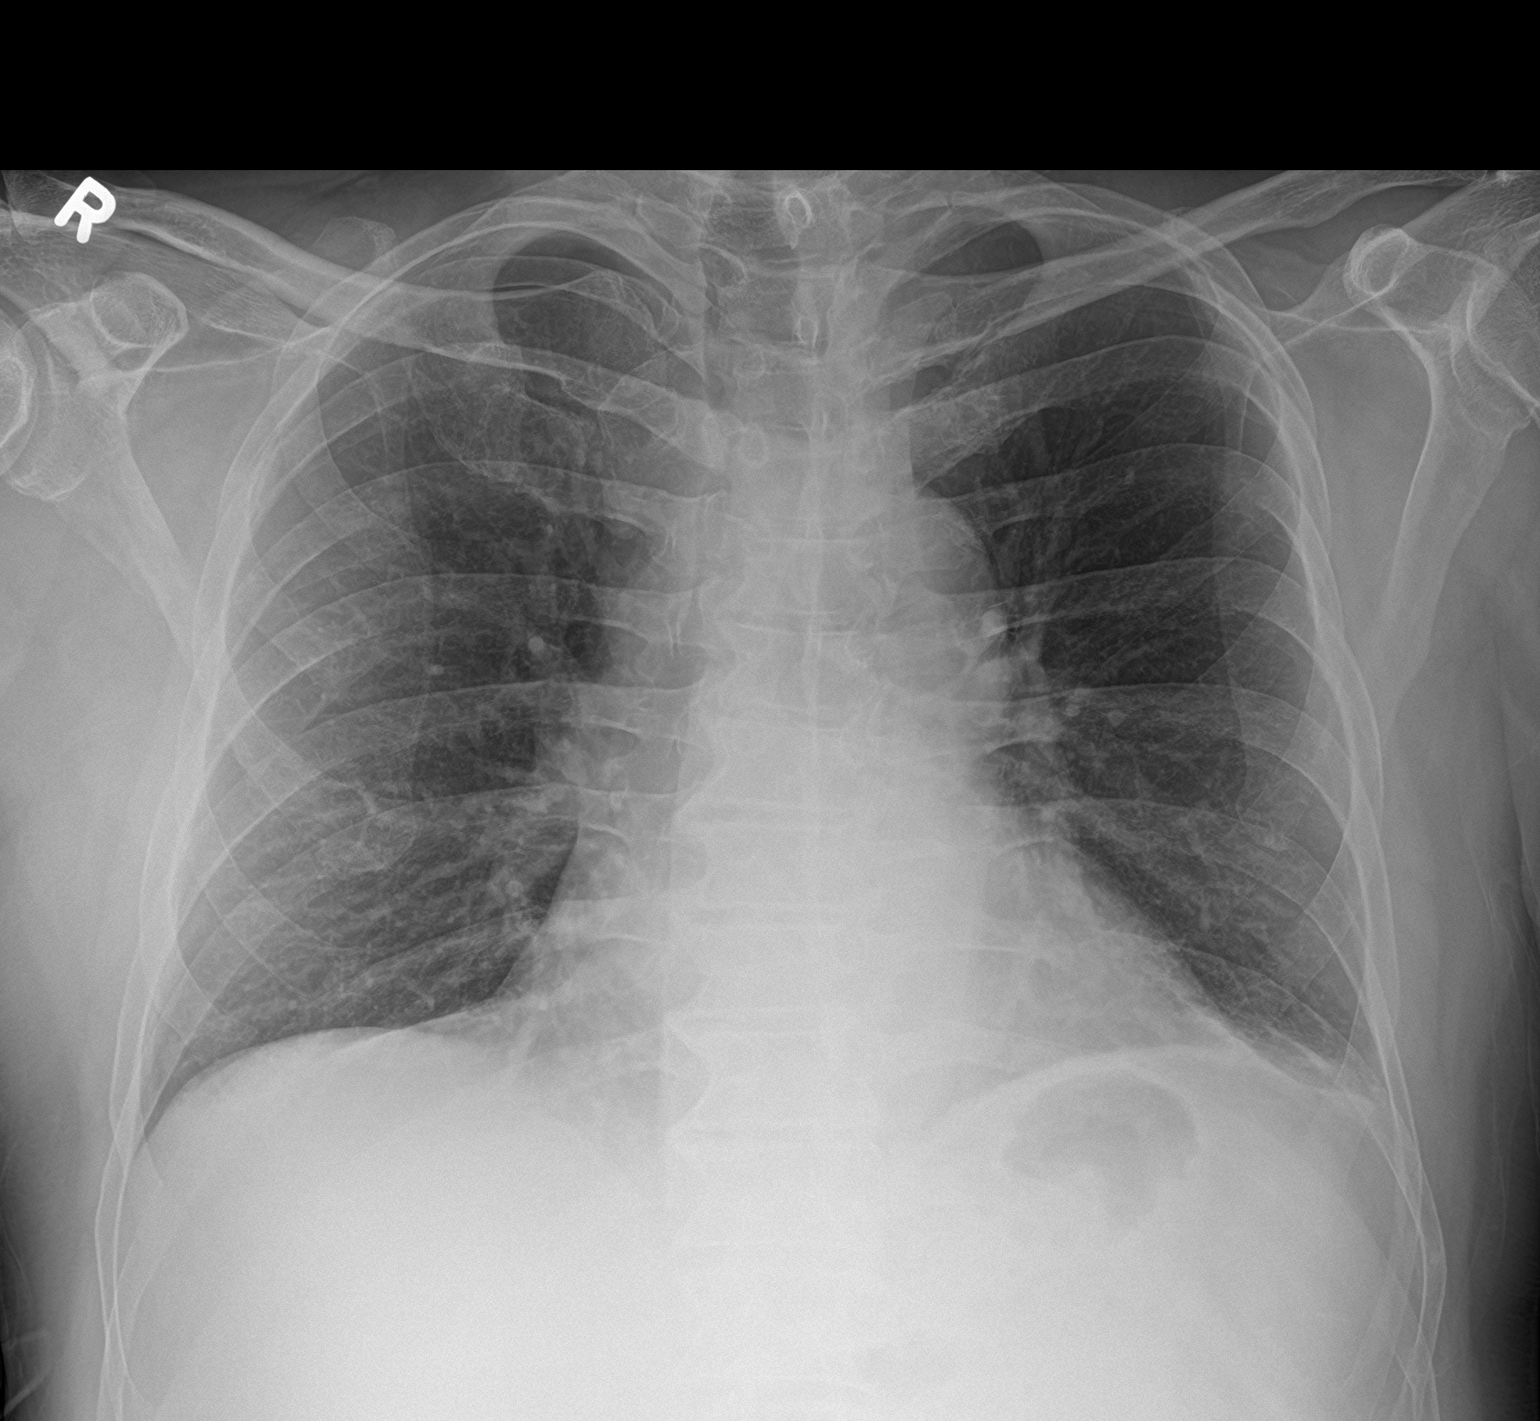

[2 of 2 positions shown; findings below may reference images not displayed]

FINDINGS: The cardiomediastinal silhouette is unchanged and mildly enlarged in
contour. No pleural effusion. No pneumothorax. Basilar platelike
opacities. Visualized abdomen is unremarkable. Multilevel
degenerative changes of the thoracic spine.
IMPRESSION: LEFT basilar platelike opacities, likely atelectasis. Superimposed
infection remains in the differential.

## 2022-12-21 IMAGING — US US EXTREM LOW VENOUS*L*
1 series · 14 of 24 positions shown · non-contrast
Comparison: None.

CLINICAL DATA: 4 days post op from left hip. pain, swelling,
discoloration. eval DVT

EXAM:
LEFT LOWER EXTREMITY VENOUS DOPPLER ULTRASOUND
TECHNIQUE: Gray-scale sonography with compression, as well as color and duplex
ultrasound, were performed to evaluate the deep venous system(s)
from the level of the common femoral vein through the popliteal and
proximal calf veins.

[Series 1: us venous img lower uni left (dvt) · portal-venous · 14 of 32 slices shown]
[im 1/32]
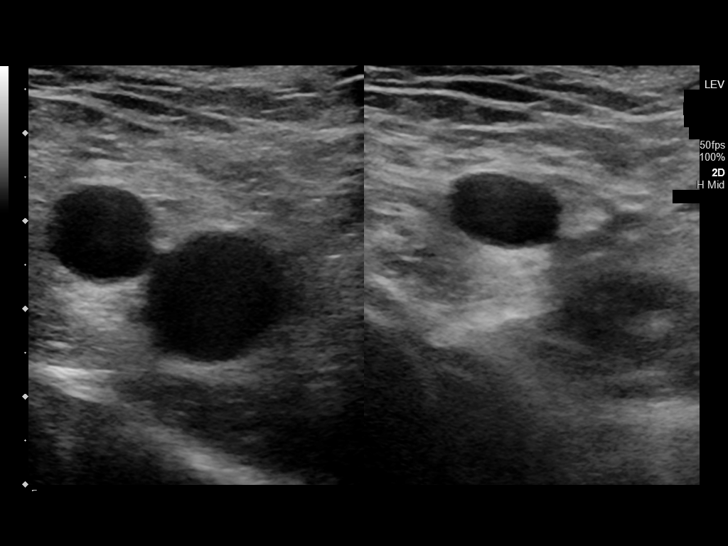
[im 3/32]
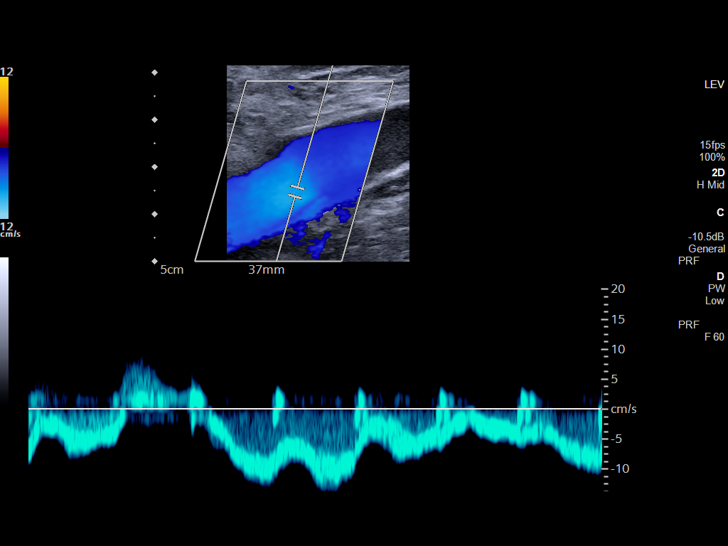
[im 6/32]
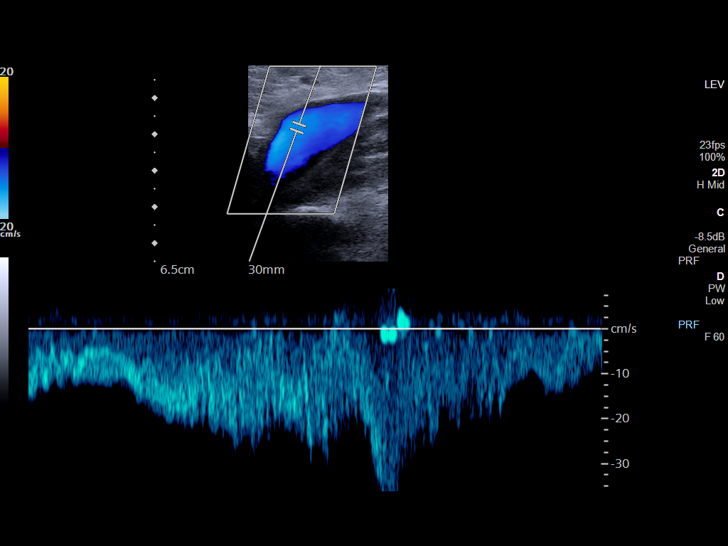
[im 9/32]
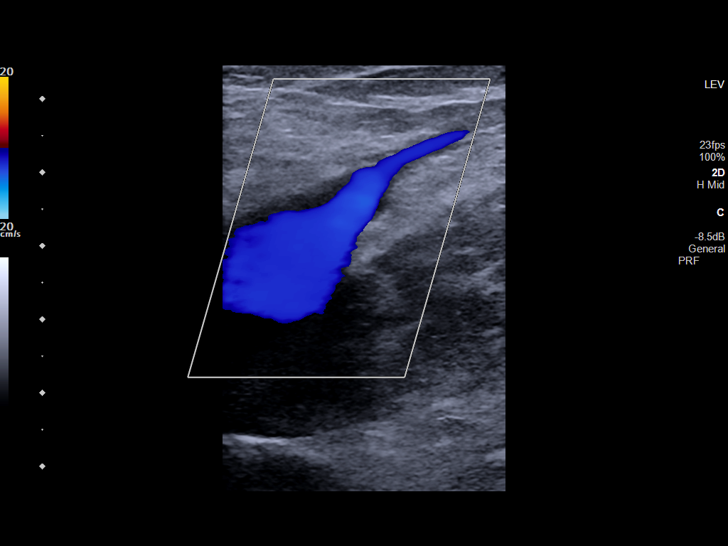
[im 10/32]
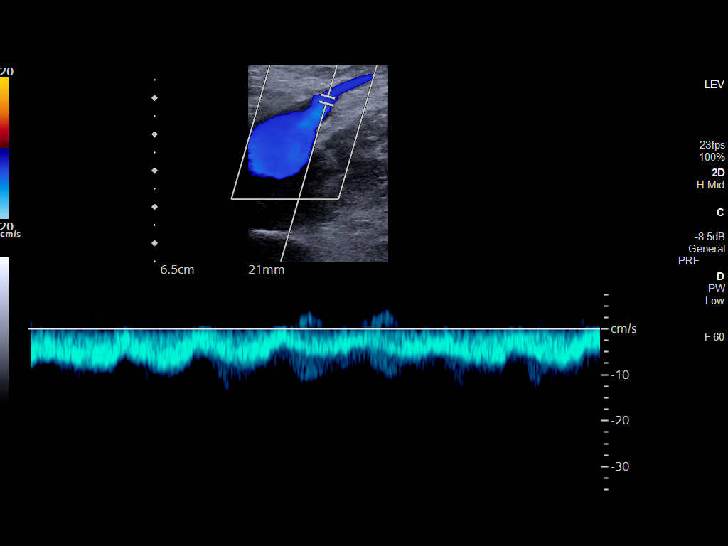
[im 13/32]
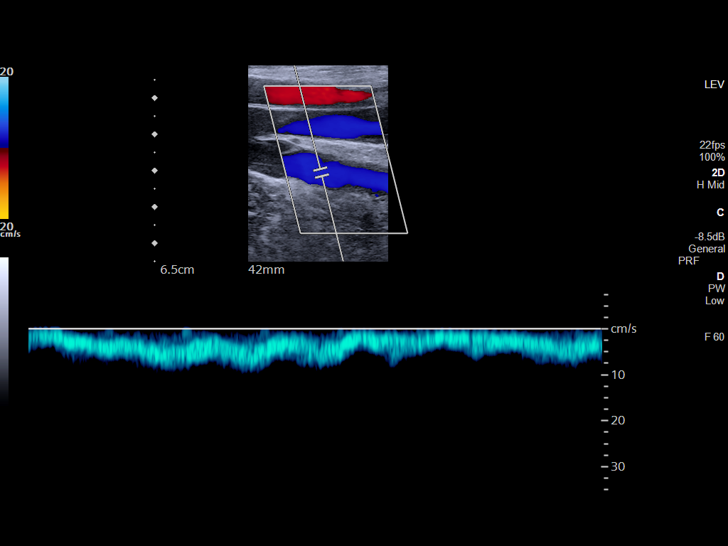
[im 15/32]
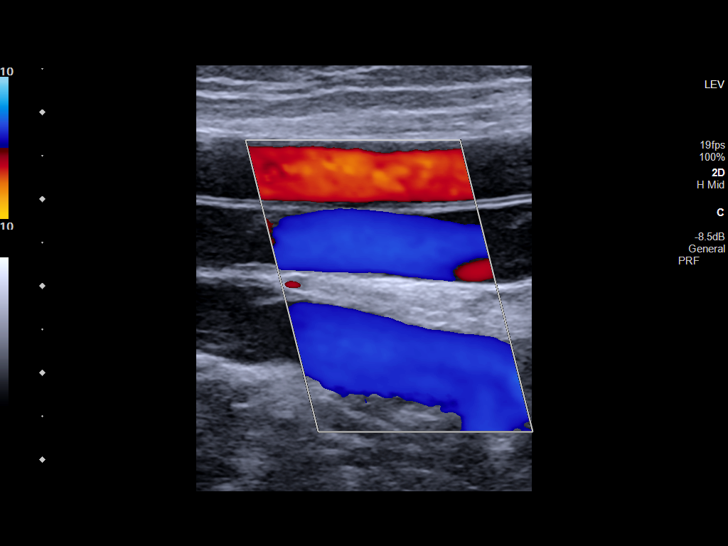
[im 17/32]
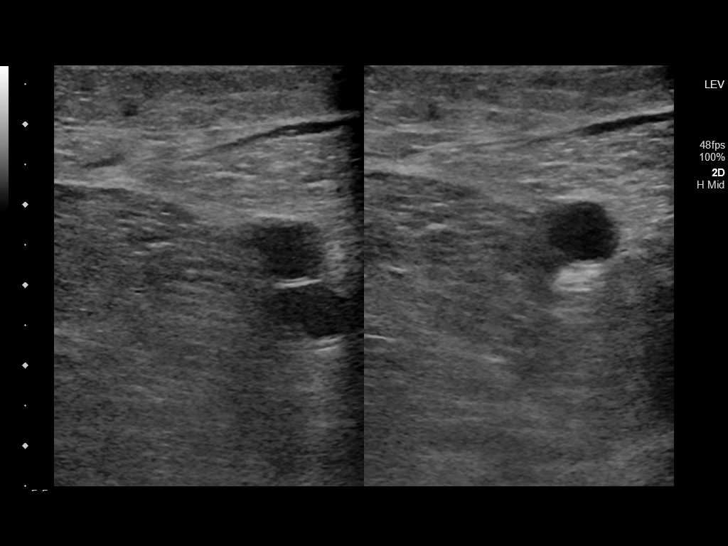
[im 19/32]
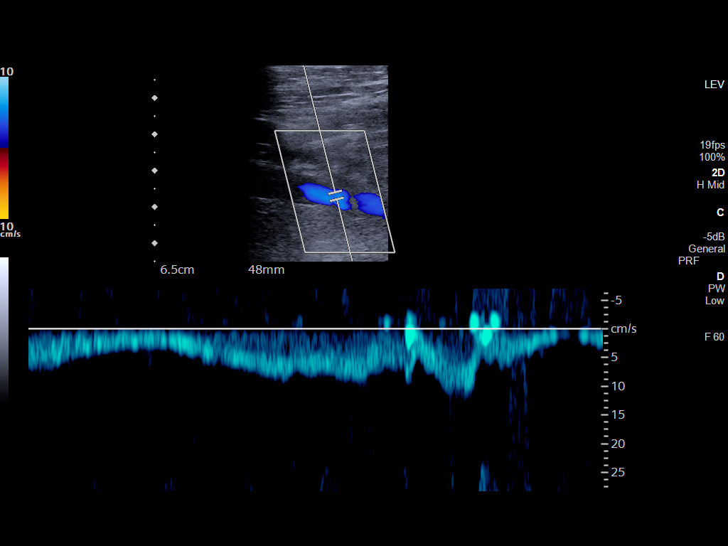
[im 22/32]
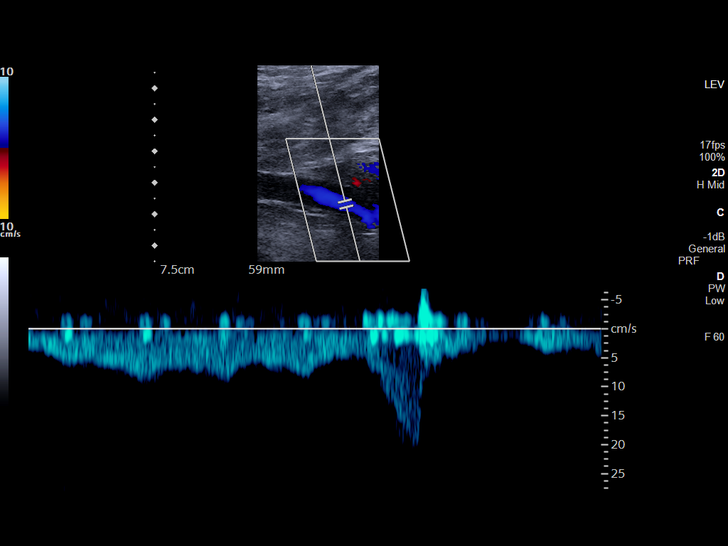
[im 25/32]
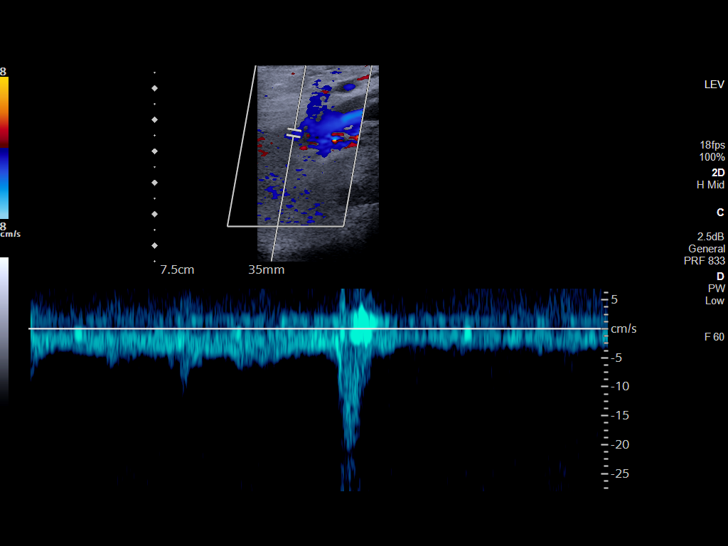
[im 26/32]
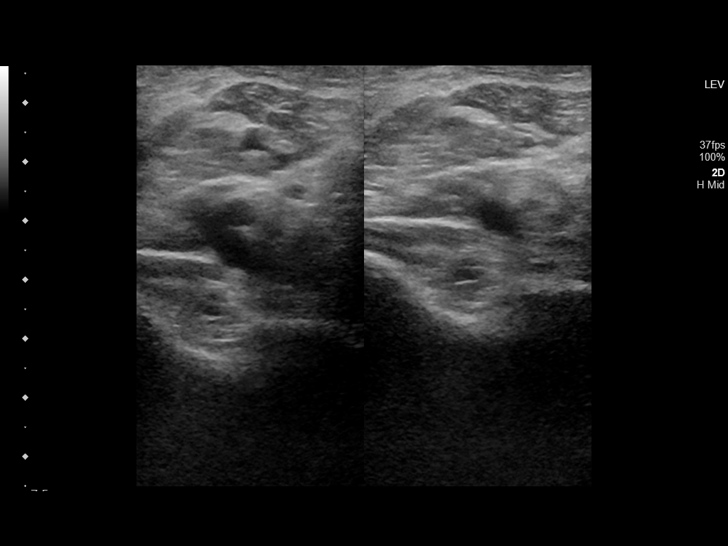
[im 29/32]
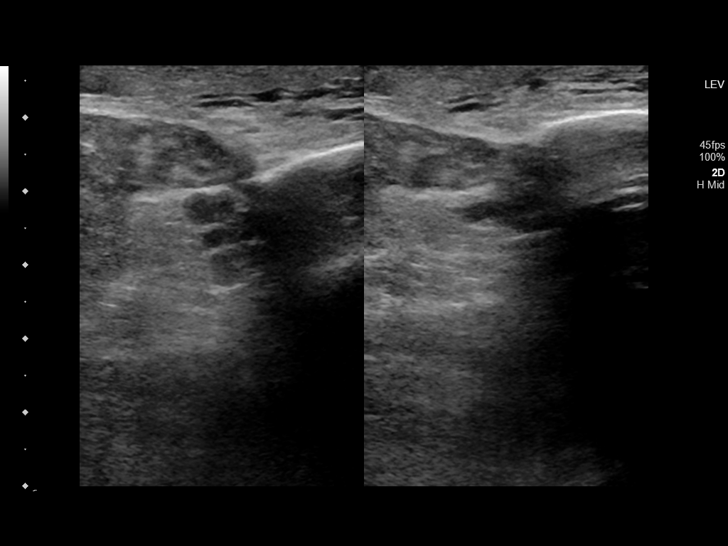
[im 32/32]
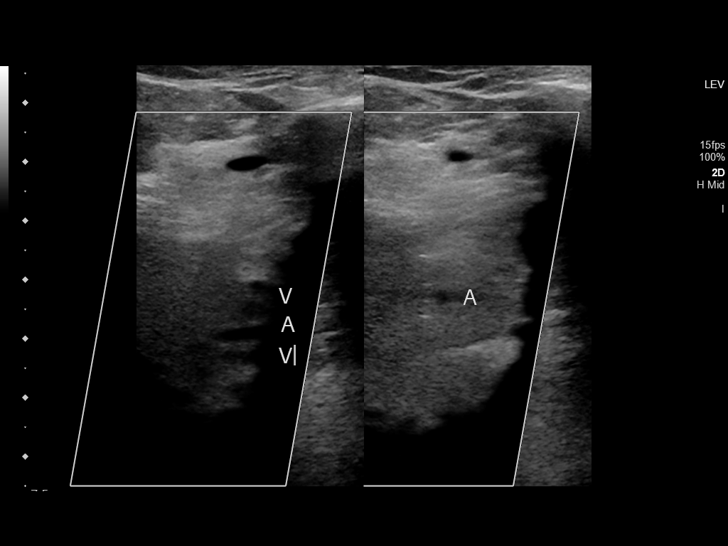

[14 of 24 positions shown; findings below may reference images not displayed]

FINDINGS: VENOUS

Normal compressibility of the common femoral, superficial femoral,
and popliteal veins, as well as the visualized calf veins.
Visualized portions of profunda femoral vein and great saphenous
vein unremarkable. No filling defects to suggest DVT on grayscale or
color Doppler imaging. Doppler waveforms show normal direction of
venous flow, normal respiratory plasticity and response to
augmentation.

Limited views of the contralateral common femoral vein are
unremarkable.

OTHER

Subcutaneous edema.

Limitations: none
IMPRESSION: Negative.

## 2022-12-21 IMAGING — CT CT ANGIO CHEST
2 of 7 series · 18 of 46 positions shown · IV contrast (APPLIED)
Comparison: 07/30/2018.

CLINICAL DATA: Shortness of breath. Status post left hip
replacement.

EXAM:
CT ANGIOGRAPHY CHEST WITH CONTRAST
TECHNIQUE: Multidetector CT imaging of the chest was performed using the
standard protocol during bolus administration of intravenous
contrast. Multiplanar CT image reconstructions and MIPs were
obtained to evaluate the vascular anatomy.
CONTRAST:  75mL OMNIPAQUE IOHEXOL 350 MG/ML SOLN

[Series 5: thins · axial · 0.77mm/px · z∈[-217,+54]mm · 15 of 377 slices shown]
[im 19/377  lung]
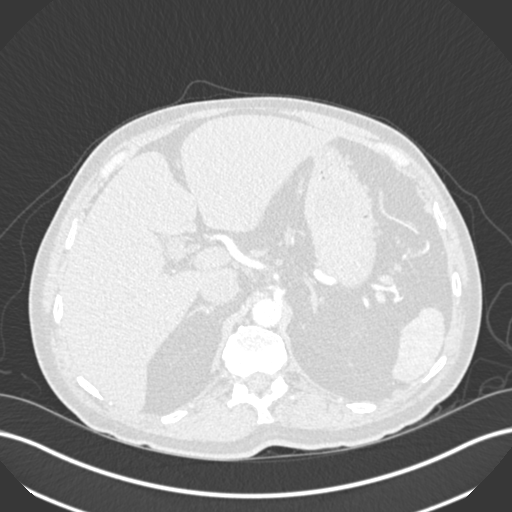
[im 38/377  soft-tissue]
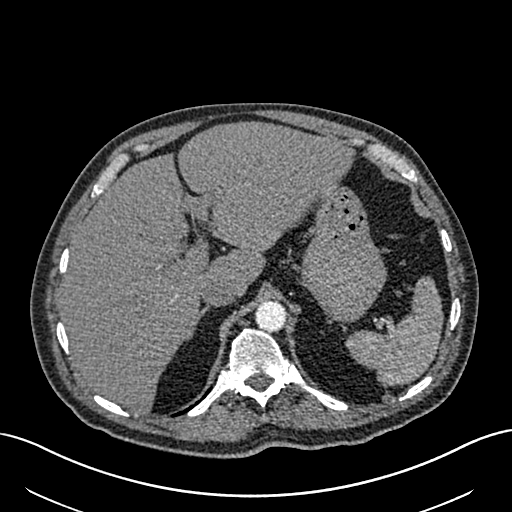
[im 76/377  lung]
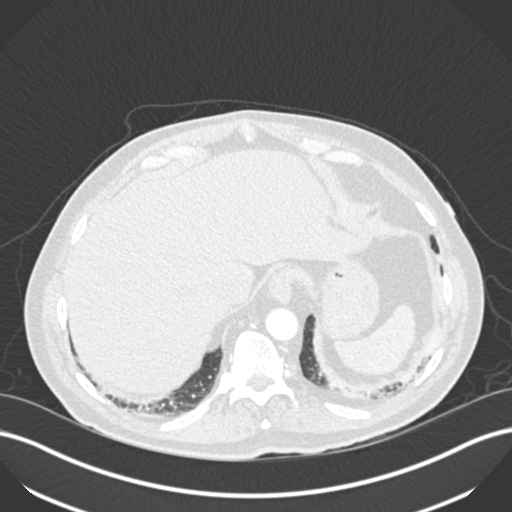
[im 95/377  soft-tissue]
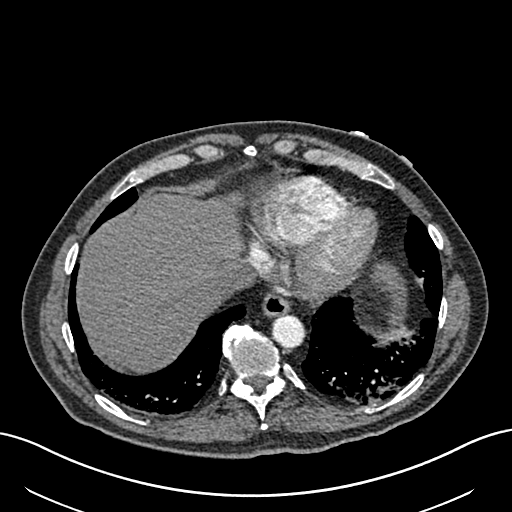
[im 113/377  lung]
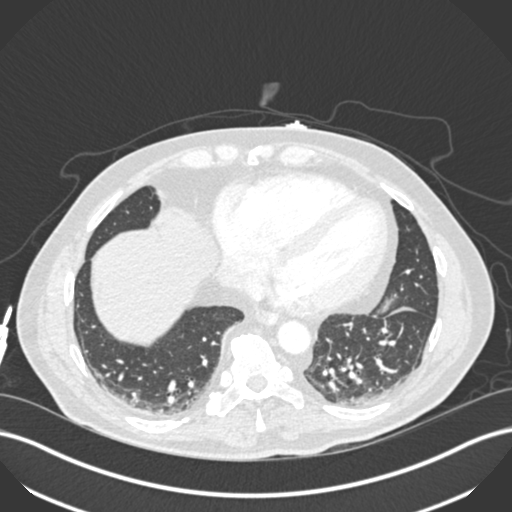
[im 132/377  soft-tissue]
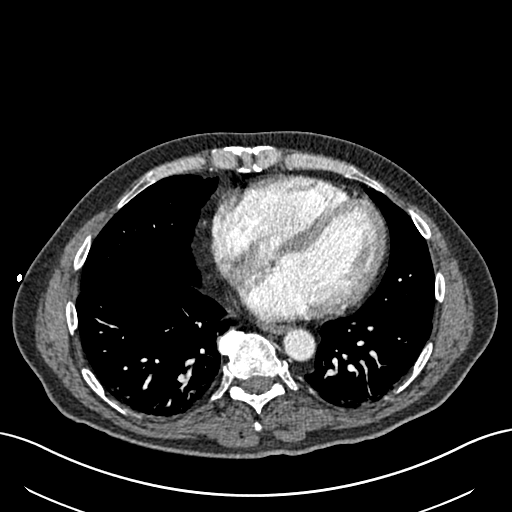
[im 170/377  lung]
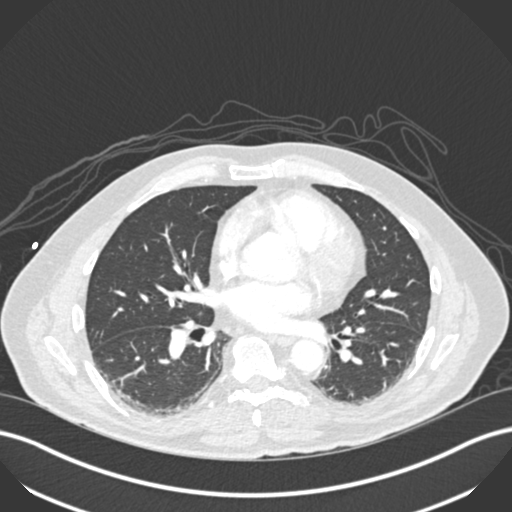
[im 189/377  soft-tissue]
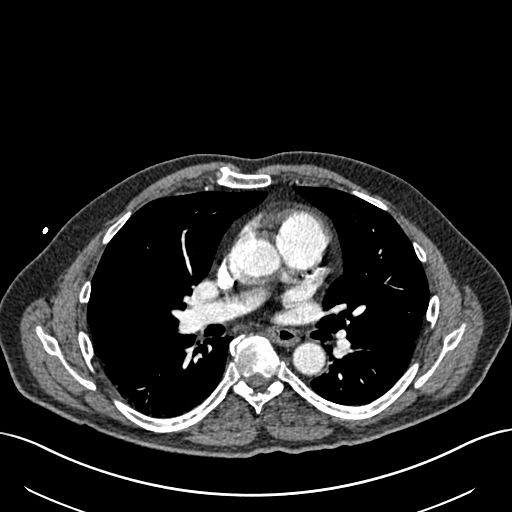
[im 207/377  lung]
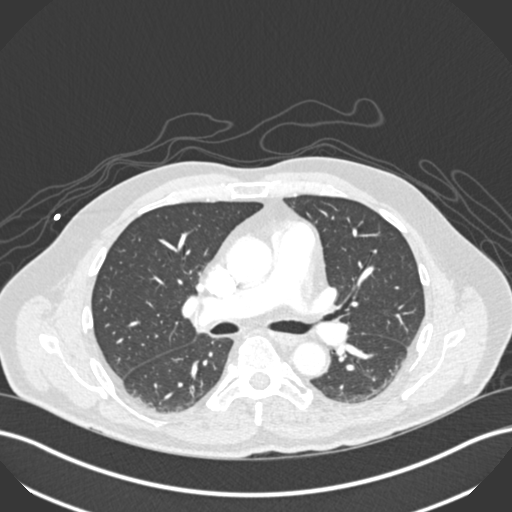
[im 245/377  soft-tissue]
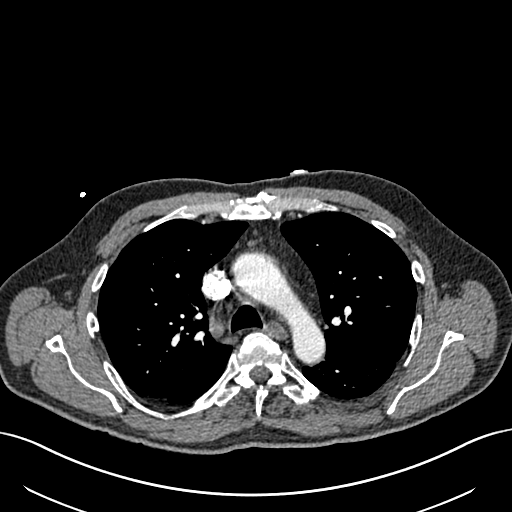
[im 264/377  lung]
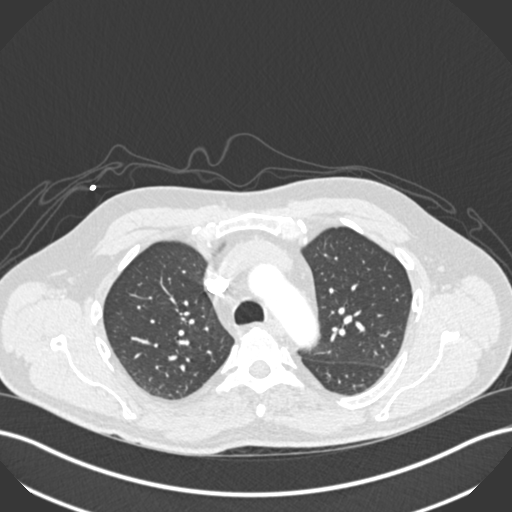
[im 283/377  soft-tissue]
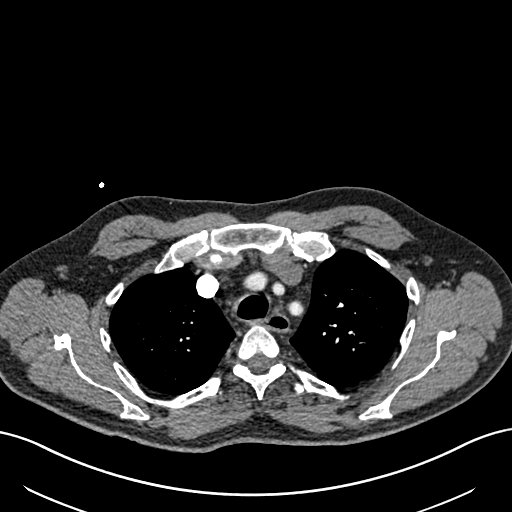
[im 301/377  lung]
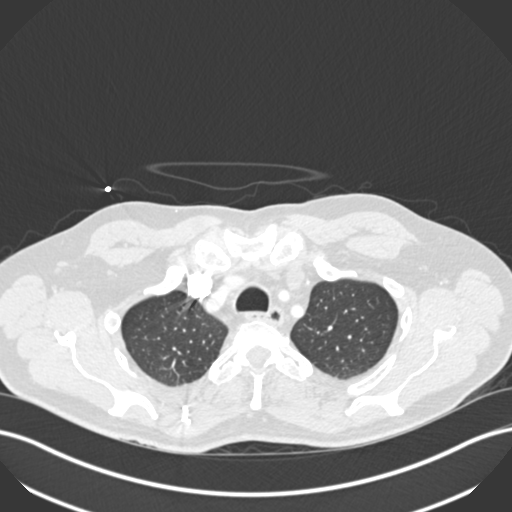
[im 339/377  soft-tissue]
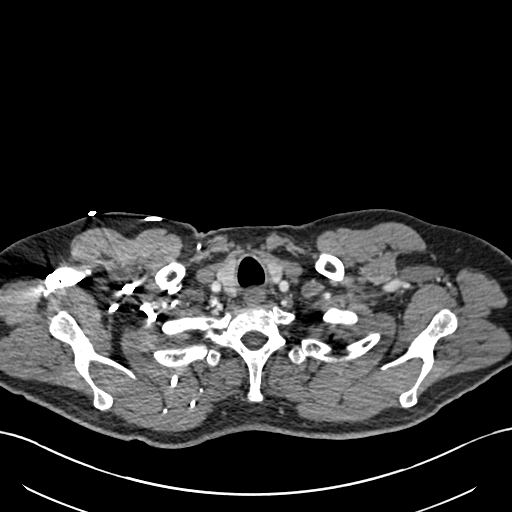
[im 358/377  lung]
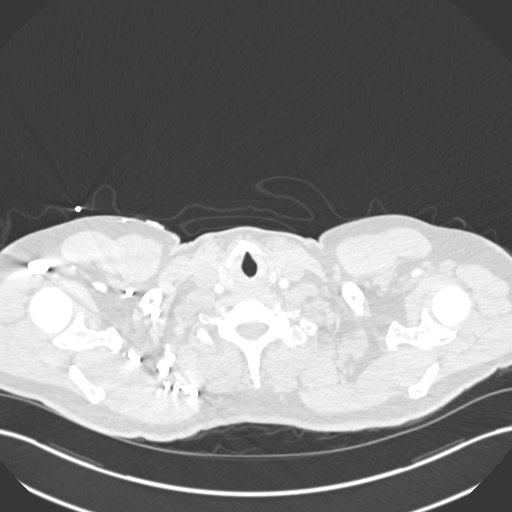

[Series 7: coronal mpr · coronal · 0.59mm/px · 3 of 106 slices shown]
[im 27/106  soft-tissue]
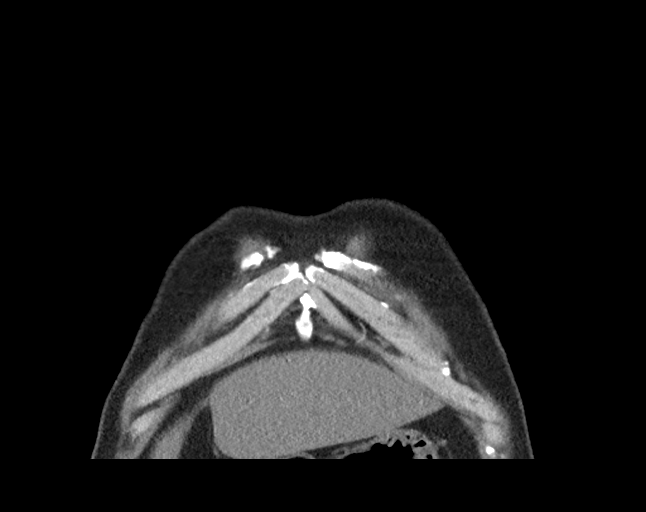
[im 53/106  soft-tissue]
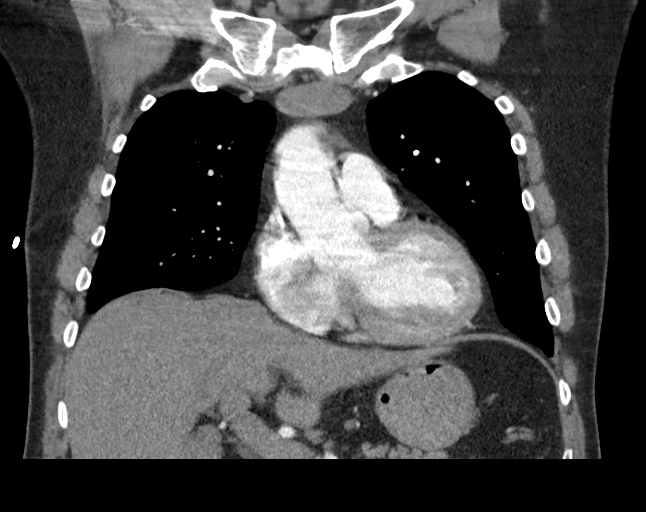
[im 79/106  soft-tissue]
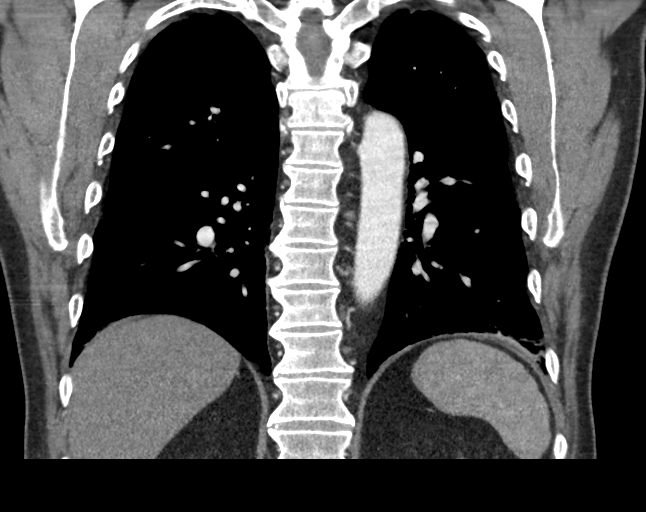

[18 of 46 positions shown; findings below may reference images not displayed]

FINDINGS: Cardiovascular: The heart size is normal. No substantial pericardial
effusion. Coronary artery calcification is evident. Mild
atherosclerotic calcification is noted in the wall of the thoracic
aorta. There is no filling defect within the opacified pulmonary
arteries to suggest the presence of an acute pulmonary embolus.

Mediastinum/Nodes: No mediastinal lymphadenopathy. There is no hilar
lymphadenopathy. The esophagus has normal imaging features. There is
no axillary lymphadenopathy.

Lungs/Pleura: Dependent atelectasis noted in the lower lobes. No
focal airspace consolidation. No pulmonary edema or pleural
effusion. Tiny peripheral calcified nodule in the right upper lobe
(37/6) is stable in the interval consistent with benign etiology.

Upper Abdomen: The liver shows diffusely decreased attenuation
suggesting fat deposition.

Musculoskeletal:

Review of the MIP images confirms the above findings.
IMPRESSION: 1. No CT evidence for acute pulmonary embolus.
2. Dependent atelectasis in the lower lobes bilaterally.
3. Hepatic steatosis.
4. Aortic Atherosclerosis ([id number]).

## 2022-12-21 NOTE — Therapy (Signed)
[facility] [address], [place], [address] Phone: [phone]   Fax:  [phone]  Occupational Therapy Treatment  Patient Details  Name: Jesse Alvarado MRN: [id number] Date of Birth: [birth year] Referring Provider (OT): Smock PA   Encounter Date: 12/21/2022   OT End of Session - 12/21/22 0832     Visit Number 8    Number of Visits 12    Date for OT Re-Evaluation 01/08/23    OT Start Time 0815    OT Stop Time 0859    OT Time Calculation (min) 44 min    Activity Tolerance Patient tolerated treatment well    Behavior During Therapy [facility] for tasks assessed/performed             Past Medical History:  Diagnosis Date   Anemia    Arthritis    Asthma    sports induced asthma. takes inhalers when needed   Barrett's esophagus without dysplasia    Cancer (HCC)    Basal and squamoous cell   Chronic kidney disease    Complication of anesthesia    high tolerance to pain medication. body absorbs pain med quickly   Coronary artery disease    Cough on exercise    Diabetes mellitus without complication ([place])    Diverticulosis    Erectile dysfunction    Fatty liver    GERD (gastroesophageal reflux disease)    History of kidney stones    Hyperlipidemia    Hypertension    per patient, he does not have high bp but is treated for his kidneys and his diabetes   Pancreatitis    Right shoulder injury    Sleep apnea    use C-PAP    Past Surgical History:  Procedure Laterality Date   ANKLE GANGLION CYST EXCISION Left    x 5   CARPAL TUNNEL RELEASE Right 10/03/2018   Procedure: CARPAL TUNNEL RELEASE;  Surgeon: Earnestine Leys, MD;  Location: [facility];  Service: Orthopedics;  Laterality: Right;   CARPAL TUNNEL RELEASE Left 10/24/2018   Procedure: CARPAL TUNNEL RELEASE;  Surgeon: Earnestine Leys, MD;  Location: [facility];  Service: Orthopedics;  Laterality: Left;   COLONOSCOPY  05/10/2014   COLONOSCOPY N/A 12/16/2022    Procedure: COLONOSCOPY;  Surgeon: Lesly Rubenstein, MD;  Location: [facility];  Service: Endoscopy;  Laterality: N/A;   COLONOSCOPY WITH PROPOFOL N/A 08/30/2019   Procedure: COLONOSCOPY WITH PROPOFOL;  Surgeon: Toledo, Benay Pike, MD;  Location: [facility] ENDOSCOPY;  Service: Gastroenterology;  Laterality: N/A;   CYSTOSCOPY WITH STENT PLACEMENT Bilateral 06/05/2016   Procedure: CYSTOSCOPY WITH STENT PLACEMENT;  Surgeon: Nickie Retort, MD;  Location: [facility];  Service: Urology;  Laterality: Bilateral;   CYSTOSCOPY/URETEROSCOPY/HOLMIUM LASER/STENT PLACEMENT Left 04/13/2016   Procedure: CYSTOSCOPY/RETROGRADE PYELOGRAM/URETEROSCOPY WITH HOLMIUM LASER LITHOTRIPSY//STENT PLACEMENT;  Surgeon: Festus Aloe, MD;  Location: [facility];  Service: Urology;  Laterality: Left;   ESOPHAGOGASTRODUODENOSCOPY N/A 12/16/2022   Procedure: ESOPHAGOGASTRODUODENOSCOPY (EGD);  Surgeon: Lesly Rubenstein, MD;  Location[facility];  Service: Endoscopy;  Laterality: N/A;   ESOPHAGOGASTRODUODENOSCOPY (EGD) WITH PROPOFOL N/A 12/27/2015   Procedure: ESOPHAGOGASTRODUODENOSCOPY (EGD) WITH PROPOFOL;  Surgeon: Manya Silvas, MD;  Location: [facility] ENDOSCOPY;  Service: Endoscopy;  Laterality: N/A;   ESOPHAGOGASTRODUODENOSCOPY (EGD) WITH PROPOFOL N/A 08/30/2019   Procedure: ESOPHAGOGASTRODUODENOSCOPY (EGD) WITH PROPOFOL;  Surgeon: Toledo, Benay Pike, MD;  Location: [facility] ENDOSCOPY;  Service: Gastroenterology;  Laterality: N/A;   EXTRACORPOREAL SHOCK WAVE LITHOTRIPSY Right 12/30/2017   Procedure: EXTRACORPOREAL  SHOCK WAVE LITHOTRIPSY (ESWL);  Surgeon: Royston Cowper, MD;  Location: [facility];  Service: Urology;  Laterality: Right;   EXTRACORPOREAL SHOCK WAVE LITHOTRIPSY Left 03/19/2022   Procedure: EXTRACORPOREAL SHOCK WAVE LITHOTRIPSY (ESWL);  Surgeon: [place] Cowper, MD;  Location: [facility];  Service: Urology;  Laterality: Left;   EXTRACORPOREAL SHOCK WAVE LITHOTRIPSY Right 08/20/2022   Procedure: EXTRACORPOREAL  SHOCK WAVE LITHOTRIPSY (ESWL);  Surgeon: [place] Cowper, MD;  Location: [facility];  Service: Urology;  Laterality: Right;   GANGLION CYST EXCISION Right 08/12/2022   Procedure: EXCISION OF DORSAL CARPAL GANGLION OF RIGHT WRIST;  Surgeon: Corky Mull, MD;  Location: [facility];  Service: Orthopedics;  Laterality: Right;   HERNIA REPAIR  [id number]   umbilical   KNEE ARTHROSCOPY Right 2015   had bursa sack repaired   KNEE ARTHROSCOPY WITH MEDIAL MENISECTOMY Right 10/11/2020   Procedure: Right knee arthroscopy partial medial meniscectomy;  Surgeon: Leim Fabry, MD;  Location: [facility];  Service: Orthopedics;  Laterality: Right;  Diabetic - oral meds sleep apnea   KNEE ARTHROSCOPY WITH MENISCAL REPAIR Right 11/18/2018   Procedure: KNEE ARTHROSCOPY WITH MENISCAL REPAIR AND CHONDROPLASTY;  Surgeon: Leim Fabry, MD;  Location: [facility];  Service: Orthopedics;  Laterality: Right;  Diabetic - oral meds sleep apnea SUPINE WITH ACL LEG HOLDER SMITH AND NEWPHEW CETERIX   REPAIR EXTENSOR TENDON Right 10/22/2022   Procedure: EXTENSOR TENOSYNOVECTOMY OF RIGHT WRIST;  Surgeon: Corky Mull, MD;  Location: [facility];  Service: Orthopedics;  Laterality: Right;   SHOULDER SURGERY Right 2011   tendon was shredded and was trimmed, repaired and reattached. Screws in shoulder   TOTAL HIP ARTHROPLASTY Left 10/28/2021   Procedure: TOTAL HIP ARTHROPLASTY;  Surgeon: Corky Mull, MD;  Location: [facility];  Service: Orthopedics;  Laterality: Left;   URETEROSCOPY WITH HOLMIUM LASER LITHOTRIPSY Bilateral 06/05/2016   Procedure: URETEROSCOPY WITH HOLMIUM LASER LITHOTRIPSY;  Surgeon: Nickie Retort, MD;  Location: [facility];  Service: Urology;  Laterality: Bilateral;   VASECTOMY  2002    There were no vitals filed for this visit.   Subjective Assessment - 12/21/22 0830     Subjective  My wrist was just sore on the sides this weekend - but I think the tape helped at the wrist - less pain doing  stretches    Pertinent History 11/20/22 Ortho note : Jesse Alvarado is a 57 y.o. male who presents today for a repeat skin check status post a revision extensive tenosynovectomy involving the dorsal aspect of the right wrist. The patient is now 4 weeks status post surgery, surgery was performed by Dr. Roland Rack on 10/22/2022. At his initial postop visit the patient was instructed to continue to wear the Velcro wrist splint routinely except for showers and continue with the Ace wrap applied to the right wrist to minimize the amount of swelling along the dorsal aspect of the right wrist. The patient denies any trauma or injury affecting right wrist since his procedure. He denies any signs of infection at home such as fevers chills or any drainage from the dorsal aspect of the right wrist. The patient has not noticed any significant focal swelling like he did after his initial tenosynovectomy. The patient has been out of work since the surgery. He does still have swelling noted in his fingers but states that this is improving at this time. The patient reports a 2 out of 10 pain score in the right upper extremity at today's visit.  He is not taking any medication consistently for the right wrist at this time. REfer to OT    Patient Stated Goals Want my motion and strength back in my R wrist so I can go back to work    Currently in Pain? Yes    Pain Score 4     Pain Location Wrist    Pain Orientation Right    Pain Descriptors / Indicators Tightness    Pain Type Surgical pain                [facility] OT Assessment - 12/21/22 0001       AROM   Right Wrist Extension 50 Degrees   60-68 session   Right Wrist Flexion 50 Degrees   70 in session                     OT Treatments/Exercises (OP) - 12/21/22 0001       RUE Paraffin   Number Minutes Paraffin 8 Minutes    RUE Paraffin Location Wrist    Comments 2 flexion, ext - each 2 min - x 2 with heatingpad             Done some soft  tissue mobs to dorsal wrist - as well as some gentle traction and joint mobs to wrist - as well as some light carpal rolls in weightbearing  Prior to wrist ext ,and flexion stretches and table slides      Great progress after paraffin and soft tissue again in session  Pt with some pain today in RD more than UD - tenderness over 1st dorsal compartment - pt to hold off on weight for UD and RD  And make sure he grasp with thumb around 2 lbs weight with wrist flexion, extention  And do not force end range     Cont at home table slides for wrist extention pain free 20 reps  CPM for wrist extention 200 sec and flexion 200 sec Followed by    2 lbs  for sup/pro and flexion, ext - 12 reps   Great progress in AROM  2 x day after his motion      No splint but Benik neoprene during day with heavy activities  Encourage pt to start doing more tasks around the house - light and pain less than 2/10  Kinesiotape done - star over dorsal scar - where 2 scars cross - for scar mobs during wrist extention and decrease tenderness or discomfort  Ed on wearing and precautions        OT Education - 12/21/22 0832     Education Details progress and changes  HEP    Person(s) Educated Patient    Methods Explanation;Demonstration;Tactile cues;Verbal cues;Handout    Comprehension Verbalized understanding;Returned demonstration;Verbal cues required              OT Short Term Goals - 11/27/22 1631       OT SHORT TERM GOAL #1   Title Patient to be independent in home program to decrease scar tissue and edema and increase wrist flexion extension.    Baseline No knowledge of home program.  Wrist extension 35, flat 25 to wrist.    Time 3    Period Weeks    Status New    Target Date 12/18/22               OT Long Term Goals - 11/27/22 1631       OT LONG  TERM GOAL #1   Title Right wrist flexion extension increased to within functional limits for patient to push and pull heavy door, twist and  turn knobs without increase symptoms    Baseline Right wrist extension 35 degrees, and 25 degrees.  Pain with functional use can increase to 3/10.  With sometimes shooting or sharp pain 7/10.    Time 6    Period Weeks    Status New    Target Date 01/08/23      OT LONG TERM GOAL #2   Title Strength in the right wrist increased to 5/5 for patient to return back to work without increase symptoms    Baseline Patient 5 weeks postop flexion 25, extension 35 degrees.  Pain 3-7/10.  No strengthening yet.    Time 6    Period Weeks    Status New    Target Date 01/08/23      OT LONG TERM GOAL #3   Title Right grip and prehension strength increased to more than 75% compared to the left with increased symptoms to return back to work.    Baseline Not tested patient 5 weeks postop limited flexion extension of the wrist to 25-35 degrees.  In 3- 7/10 at rest    Time 6    Period Weeks    Status New    Target Date 01/08/23                   Plan - 12/21/22 [id number]     Clinical Impression Statement Patient  post right dorsal wrist tenosynovectomy 10/22/22.  Patient had a cyst removed in October 23.  Patient present at eval with decreased right dominant wrist flexion/ extension, increased scar tissue increased pain with some edema in hand/ wrist.  Patient limited in functional use of right dominant hand in ADLs and IADLs as well as to return to work. Pt making great progress in edema and pain -as well as increase flexion , ext of wrist every  session. Tenderness  where 2 scars cross with wrist extention/?- nerve pain over sensory ulnar branch  on dorsal hand improving. Pt was in hospital with some GI issues lwas week that slowed down progress -but doing well in session today - pt to hold off on RD, UD using 2 lbs weight - and making sure not to force end range with 2 lbs weight.  Pt to cont using paraffin at home with stretches. Patient can benefit from skilled OT services to increase motion, increase  scar tissue and pain increase strength to return back to prior level of function.    OT Occupational Profile and History Problem Focused Assessment - Including review of records relating to presenting problem    Occupational performance deficits (Please refer to evaluation for details): ADL's;IADL's;Work;Social Participation;Leisure;Play    Body Structure / Function / Physical Skills ADL;Scar mobility;UE functional use;Flexibility;IADL;Pain;Strength;Edema;ROM    Rehab Potential Good    Clinical Decision Making Limited treatment options, no task modification necessary    Comorbidities Affecting Occupational Performance: None    Modification or Assistance to Complete Evaluation  No modification of tasks or assist necessary to complete eval    OT Frequency 2x / week    OT Duration 6 weeks    OT Treatment/Interventions Self-care/ADL training;Moist Heat;Paraffin;Fluidtherapy;Contrast Bath;Passive range of motion;Therapeutic activities;Splinting;Scar mobilization;Manual Therapy;Therapeutic exercise;Patient/family education    Consulted and Agree with Plan of Care Patient             Patient will benefit from skilled  therapeutic intervention in order to improve the following deficits and impairments:   Body Structure / Function / Physical Skills: ADL, Scar mobility, UE functional use, Flexibility, IADL, Pain, Strength, Edema, ROM       Visit Diagnosis: Pain in right wrist  Scar condition and fibrosis of skin  Muscle weakness (generalized)    Problem List Patient Active Problem List   Diagnosis Date Noted   Acute GI bleeding 12/15/2022   Status post total hip replacement, left 10/28/2021   Dyslipidemia 08/24/2021   Status post colonoscopy 06/02/2019   Hepatic steatosis 06/02/2019   Barrett's esophagus without dysplasia 01/19/2019   High coronary artery calcium score 12/29/2018   Chronic iron deficiency anemia 12/22/2017   Gastritis without bleeding 06/25/2017   Vitamin B12  deficiency 08/03/2016   OSA on CPAP 04/18/2016   Nephrolithiasis 04/13/2016   Chronic midline low back pain without sciatica 01/25/2016   Primary osteoarthritis involving multiple joints 11/20/2015   Bilateral carpal tunnel syndrome 09/14/2015   Essential hypertension 09/14/2015   Chronic knee pain 08/30/2014   Erectile dysfunction 08/30/2014   Other allergic rhinitis 08/30/2014   Onychomycosis of toenail 07/24/2014   Hyperlipidemia associated with type 2 diabetes mellitus ([place]) 04/23/2014   Type II diabetes mellitus with complication ([place]) [id number]    Rosalyn Gess, OTR/L,CLT 12/21/2022, 9:44 AM  [facility] [address], [place], [address] Phone: [phone]   Fax:  [phone]  Name: Jesse Alvarado MRN: [id number] Date of Birth: 08-07-1966

## 2022-12-24 ENCOUNTER — Ambulatory Visit: Payer: Managed Care, Other (non HMO) | Admitting: Occupational Therapy

## 2022-12-24 DIAGNOSIS — M25531 Pain in right wrist: Secondary | ICD-10-CM | POA: Diagnosis not present

## 2022-12-24 DIAGNOSIS — M6281 Muscle weakness (generalized): Secondary | ICD-10-CM

## 2022-12-24 DIAGNOSIS — M25631 Stiffness of right wrist, not elsewhere classified: Secondary | ICD-10-CM

## 2022-12-24 DIAGNOSIS — L905 Scar conditions and fibrosis of skin: Secondary | ICD-10-CM

## 2022-12-24 NOTE — Therapy (Signed)
[facility] [address], [place], [address] Phone: [phone]   Fax:  [phone]  Occupational Therapy Treatment  Patient Details  Name: Jesse Alvarado MRN: [id number] Date of Birth: [birth year] Referring Provider (OT): Camas PA   Encounter Date: 12/24/2022   OT End of Session - 12/24/22 1947     Visit Number 9    Number of Visits 12    Date for OT Re-Evaluation 01/08/23    OT Start Time 0815    OT Stop Time 0902    OT Time Calculation (min) 47 min    Activity Tolerance Patient tolerated treatment well    Behavior During Therapy [facility] for tasks assessed/performed             Past Medical History:  Diagnosis Date   Anemia    Arthritis    Asthma    sports induced asthma. takes inhalers when needed   Barrett's esophagus without dysplasia    Cancer (HCC)    Basal and squamoous cell   Chronic kidney disease    Complication of anesthesia    high tolerance to pain medication. body absorbs pain med quickly   Coronary artery disease    Cough on exercise    Diabetes mellitus without complication ([place])    Diverticulosis    Erectile dysfunction    Fatty liver    GERD (gastroesophageal reflux disease)    History of kidney stones    Hyperlipidemia    Hypertension    per patient, he does not have high bp but is treated for his kidneys and his diabetes   Pancreatitis    Right shoulder injury    Sleep apnea    use C-PAP    Past Surgical History:  Procedure Laterality Date   ANKLE GANGLION CYST EXCISION Left    x 5   CARPAL TUNNEL RELEASE Right 10/03/2018   Procedure: CARPAL TUNNEL RELEASE;  Surgeon: Earnestine Leys, MD;  Location: [facility];  Service: Orthopedics;  Laterality: Right;   CARPAL TUNNEL RELEASE Left 10/24/2018   Procedure: CARPAL TUNNEL RELEASE;  Surgeon: Earnestine Leys, MD;  Location: [facility];  Service: Orthopedics;  Laterality: Left;   COLONOSCOPY  05/10/2014   COLONOSCOPY N/A 12/16/2022    Procedure: COLONOSCOPY;  Surgeon: Lesly Rubenstein, MD;  Location: [facility];  Service: Endoscopy;  Laterality: N/A;   COLONOSCOPY WITH PROPOFOL N/A 08/30/2019   Procedure: COLONOSCOPY WITH PROPOFOL;  Surgeon: Toledo, Benay Pike, MD;  Location: [facility] ENDOSCOPY;  Service: Gastroenterology;  Laterality: N/A;   CYSTOSCOPY WITH STENT PLACEMENT Bilateral 06/05/2016   Procedure: CYSTOSCOPY WITH STENT PLACEMENT;  Surgeon: Nickie Retort, MD;  Location: [facility];  Service: Urology;  Laterality: Bilateral;   CYSTOSCOPY/URETEROSCOPY/HOLMIUM LASER/STENT PLACEMENT Left 04/13/2016   Procedure: CYSTOSCOPY/RETROGRADE PYELOGRAM/URETEROSCOPY WITH HOLMIUM LASER LITHOTRIPSY//STENT PLACEMENT;  Surgeon: Festus Aloe, MD;  Location: [facility];  Service: Urology;  Laterality: Left;   ESOPHAGOGASTRODUODENOSCOPY N/A 12/16/2022   Procedure: ESOPHAGOGASTRODUODENOSCOPY (EGD);  Surgeon: Lesly Rubenstein, MD;  Location: [facility] END[facility]COPY;  Service: Endoscopy;  Laterality: N/A;   ESOPHAGOGASTRODUODENOSCOPY (EGD) WITH PROPOFOL N/A 12/27/2015   Procedure: ESOPHAGOGASTRODUODENOSCOPY (EGD) WITH PROPOFOL;  Surgeon: Manya Silvas, MD;  Location: [facility] ENDOSCOPY;  Service: Endoscopy;  Laterality: N/A;   ESOPHAGOGASTRODUODENOSCOPY (EGD) WITH PROPOFOL N/A 08/30/2019   Procedure: ESOPHAGOGASTRODUODENOSCOPY (EGD) WITH PROPOFOL;  Surgeon: Toledo, Benay Pike, MD;  Location: [facility] ENDOSCOPY;  Service: Gastroenterology;  Laterality: N/A;   EXTRACORPOREAL SHOCK WAVE LITHOTRIPSY Right 12/30/2017   Procedure: EXTRACORPOREAL  SHOCK WAVE LITHOTRIPSY (ESWL);  Surgeon: Royston Cowper, MD;  Location: [facility];  Service: Urology;  Laterality: Right;   EXTRACORPOREAL SHOCK WAVE LITHOTRIPSY Left 03/19/2022   Procedure: EXTRACORPOREAL SHOCK WAVE LITHOTRIPSY (ESWL);  Surgeon: Royston Cowper, MD;  Location: [facility];  Service: Urology;  Laterality: Left;   EXTRACORPOREAL SHOCK WAVE LITHOTRIPSY Right 08/20/2022   Procedure: EXTRACORPOREAL  SHOCK WAVE LITHOTRIPSY (ESWL);  Surgeon: Royston Cowper, MD;  Location: [facility];  Service: Urology;  Laterality: Right;   GANGLION CYST EXCISION Right 08/12/2022   Procedure: EXCISION OF DORSAL CARPAL GANGLION OF RIGHT WRIST;  Surgeon: Corky Mull, MD;  Location: [facility];  Service: Orthopedics;  Laterality: Right;   HERNIA REPAIR  [id number]   umbilical   KNEE ARTHROSCOPY Right 2015   had bursa sack repaired   KNEE ARTHROSCOPY WITH MEDIAL MENISECTOMY Right 10/11/2020   Procedure: Right knee arthroscopy partial medial meniscectomy;  Surgeon: Leim Fabry, MD;  Location: [place];  Service: Orthopedics;  Laterality: Right;  Diabetic - oral meds sleep apnea   KNEE ARTHROSCOPY WITH MENISCAL REPAIR Right 11/18/2018   Procedure: KNEE ARTHROSCOPY WITH MENISCAL REPAIR AND CHONDROPLASTY;  Surgeon: Leim Fabry, MD;  Location: [place];  Service: Orthopedics;  Laterality: Right;  Diabetic - oral meds sleep apnea SUPINE WITH ACL LEG HOLDER SMITH AND NEWPHEW CETERIX   REPAIR EXTENSOR TENDON Right 10/22/2022   Procedure: EXTENSOR TENOSYNOVECTOMY OF RIGHT WRIST;  Surgeon: Corky Mull, MD;  Location: [facility];  Service: Orthopedics;  Laterality: Right;   SHOULDER SURGERY Right 2011   tendon was shredded and was trimmed, repaired and reattached. Screws in shoulder   TOTAL HIP ARTHROPLASTY Left 10/28/2021   Procedure: TOTAL HIP ARTHROPLASTY;  Surgeon: Corky Mull, MD;  Location: [facility];  Service: Orthopedics;  Laterality: Left;   URETEROSCOPY WITH HOLMIUM LASER LITHOTRIPSY Bilateral 06/05/2016   Procedure: URETEROSCOPY WITH HOLMIUM LASER LITHOTRIPSY;  Surgeon: Nickie Retort, MD;  Location: [facility];  Service: Urology;  Laterality: Bilateral;   VASECTOMY  2002    There were no vitals filed for this visit.   Subjective Assessment - 12/24/22 1945     Subjective  Wrist just tight and stiff over the top when bending - and pain only when you rubb over it down when I am bending  it back - I think I should be good going back to work - they can always help me if needed    Pertinent History 11/20/22 Ortho note : Jesse Alvarado is a 58 y.o. male who presents today for a repeat skin check status post a revision extensive tenosynovectomy involving the dorsal aspect of the right wrist. The patient is now 4 weeks status post surgery, surgery was performed by Dr. Roland Rack on 10/22/2022. At his initial postop visit the patient was instructed to continue to wear the Velcro wrist splint routinely except for showers and continue with the Ace wrap applied to the right wrist to minimize the amount of swelling along the dorsal aspect of the right wrist. The patient denies any trauma or injury affecting right wrist since his procedure. He denies any signs of infection at home such as fevers chills or any drainage from the dorsal aspect of the right wrist. The patient has not noticed any significant focal swelling like he did after his initial tenosynovectomy. The patient has been out of work since the surgery. He does still have swelling noted in his fingers but states that this is improving at  this time. The patient reports a 2 out of 10 pain score in the right upper extremity at today's visit. He is not taking any medication consistently for the right wrist at this time. REfer to OT    Patient Stated Goals Want my motion and strength back in my R wrist so I can go back to work    Currently in Pain? No/denies                [facility] OT Assessment - 12/24/22 0001       Strength   Right Hand Grip (lbs) 79    Right Hand Lateral Pinch 25 lbs    Right Hand 3 Point Pinch 24 lbs    Left Hand Grip (lbs) 85    Left Hand Lateral Pinch 22 lbs                      OT Treatments/Exercises (OP) - 12/24/22 0001       RUE Paraffin   Number Minutes Paraffin 8 Minutes    RUE Paraffin Location Wrist    Comments flexion, ext 2 min each - stretch            Done some soft tissue mobs  to dorsal wrist - as well as some gentle traction and joint mobs to wrist - as well as some light carpal rolls in weightbearing  Prior to wrist ext ,and flexion stretches and table slides      Great progress after paraffin and soft tissue again in session  And make sure he grasp with thumb around 2 lbs weight with wrist flexion, extention  And do not force end range      Cont at home table slides for wrist extention pain free 20 reps  CPM for wrist extention 200 sec and flexion 200 sec Followed by    2 lbs  flexion, ext - 12 reps    Great progress in AROM  2 x day after his motion  Add green putty for pulling lumbrical fist and twisting - back and forth  15 reps  Pulling in to RD and UD , and wrist flexion, extention  15 reps  Combining wrist and gripping  Pain free      No splint but Benik neoprene during day with heavy activities  Encourage pt to start doing more tasks around the house - light and pain less than 2/10  Kinesiotape done - star over dorsal scar - where 2 scars cross - for scar mobs during wrist extention and decrease tenderness or discomfort  Ed on wearing and precautions         OT Education - 12/24/22 1947     Education Details progress and changes  HEP    Person(s) Educated Patient    Methods Explanation;Demonstration;Tactile cues;Verbal cues;Handout    Comprehension Verbalized understanding;Returned demonstration;Verbal cues required              OT Short Term Goals - 11/27/22 1631       OT SHORT TERM GOAL #1   Title Patient to be independent in home program to decrease scar tissue and edema and increase wrist flexion extension.    Baseline No knowledge of home program.  Wrist extension 35, flat 25 to wrist.    Time 3    Period Weeks    Status New    Target Date 12/18/22               OT Long  Term Goals - 11/27/22 1631       OT LONG TERM GOAL #1   Title Right wrist flexion extension increased to within functional limits for  patient to push and pull heavy door, twist and turn knobs without increase symptoms    Baseline Right wrist extension 35 degrees, and 25 degrees.  Pain with functional use can increase to 3/10.  With sometimes shooting or sharp pain 7/10.    Time 6    Period Weeks    Status New    Target Date 01/08/23      OT LONG TERM GOAL #2   Title Strength in the right wrist increased to 5/5 for patient to return back to work without increase symptoms    Baseline Patient 5 weeks postop flexion 25, extension 35 degrees.  Pain 3-7/10.  No strengthening yet.    Time 6    Period Weeks    Status New    Target Date 01/08/23      OT LONG TERM GOAL #3   Title Right grip and prehension strength increased to more than 75% compared to the left with increased symptoms to return back to work.    Baseline Not tested patient 5 weeks postop limited flexion extension of the wrist to 25-35 degrees.  In 3- 7/10 at rest    Time 6    Period Weeks    Status New    Target Date 01/08/23                   Plan - 12/24/22 1947     Clinical Impression Statement Patient  post right dorsal wrist tenosynovectomy 10/22/22.  Patient had a cyst removed in October 23.  Patient present at eval with decreased right dominant wrist flexion/ extension, increased scar tissue increased pain with some edema in hand/ wrist.  Patient limited in functional use of right dominant hand in ADLs and IADLs as well as to return to work. Pt making great progress in edema and pain -as well as increase flexion , ext of wrist every  session. Tenderness  where 2 scars cross with wrist extention and scar mobs proximally. Pt doing well with strenghtening and pain free AROM- mostly tightness at end range flexion, extention.   Pt to cont using paraffin at home with stretches. Patient can benefit from skilled OT services to increase motion, increase scar tissue and pain increase strength to return back to prior level of function.    OT Occupational  Profile and History Problem Focused Assessment - Including review of records relating to presenting problem    Occupational performance deficits (Please refer to evaluation for details): ADL's;IADL's;Work;Social Participation;Leisure;Play    Body Structure / Function / Physical Skills ADL;Scar mobility;UE functional use;Flexibility;IADL;Pain;Strength;Edema;ROM    Rehab Potential Good    Clinical Decision Making Limited treatment options, no task modification necessary    Comorbidities Affecting Occupational Performance: None    Modification or Assistance to Complete Evaluation  No modification of tasks or assist necessary to complete eval    OT Frequency 2x / week    OT Duration 6 weeks    OT Treatment/Interventions Self-care/ADL training;Moist Heat;Paraffin;Fluidtherapy;Contrast Bath;Passive range of motion;Therapeutic activities;Splinting;Scar mobilization;Manual Therapy;Therapeutic exercise;Patient/family education    Consulted and Agree with Plan of Care Patient             Patient will benefit from skilled therapeutic intervention in order to improve the following deficits and impairments:   Body Structure / Function / Physical Skills: ADL, Scar  mobility, UE functional use, Flexibility, IADL, Pain, Strength, Edema, ROM       Visit Diagnosis: Pain in right wrist  Scar condition and fibrosis of skin  Muscle weakness (generalized)  Stiffness of right wrist, not elsewhere classified    Problem List Patient Active Problem List   Diagnosis Date Noted   Acute GI bleeding 12/15/2022   Status post total hip replacement, left 10/28/2021   Dyslipidemia 08/24/2021   Status post colonoscopy 06/02/2019   Hepatic steatosis 06/02/2019   Barrett's esophagus without dysplasia 01/19/2019   High coronary artery calcium score 12/29/2018   Chronic iron deficiency anemia 12/22/2017   Gastritis without bleeding 06/25/2017   Vitamin B12 deficiency 08/03/2016   OSA on CPAP 04/18/2016    Nephrolithiasis 04/13/2016   Chronic midline low back pain without sciatica 01/25/2016   Primary osteoarthritis involving multiple joints 11/20/2015   Bilateral carpal tunnel syndrome 09/14/2015   Essential hypertension 09/14/2015   Chronic knee pain 08/30/2014   Erectile dysfunction 08/30/2014   Other allergic rhinitis 08/30/2014   Onychomycosis of toenail 07/24/2014   Hyperlipidemia associated with type 2 diabetes mellitus ([place]) 04/23/2014   Type II diabetes mellitus with complication ([place]) [id number]    Rosalyn Gess, OTR/L;CLT 12/24/2022, 7:59 PM  [facility] [address] [place], [address] Phone: [phone]   Fax:  [phone]  Name: Jesse Alvarado MRN: [id number] Date of Birth: Oct 19, 1966

## 2022-12-28 ENCOUNTER — Ambulatory Visit: Payer: Managed Care, Other (non HMO) | Admitting: Occupational Therapy

## 2022-12-28 DIAGNOSIS — L905 Scar conditions and fibrosis of skin: Secondary | ICD-10-CM

## 2022-12-28 DIAGNOSIS — M25531 Pain in right wrist: Secondary | ICD-10-CM | POA: Diagnosis not present

## 2022-12-28 DIAGNOSIS — M25631 Stiffness of right wrist, not elsewhere classified: Secondary | ICD-10-CM

## 2022-12-28 DIAGNOSIS — M6281 Muscle weakness (generalized): Secondary | ICD-10-CM

## 2022-12-28 NOTE — Therapy (Signed)
[facility] [address] [place], [address] Phone: [phone]   Fax:  [phone]  Occupational Therapy Treatment/10th visit  Patient Details  Name: Jesse Alvarado MRN: [id number] Date of Birth: 12/10/1965 Referring Provider (OT): [place] PA   Encounter Date: 12/28/2022   OT End of Session - 12/28/22 0833     Visit Number 10    Number of Visits 12    Date for OT Re-Evaluation 01/08/23    OT Start Time 0821    OT Stop Time 0900    OT Time Calculation (min) 39 min    Activity Tolerance Patient tolerated treatment well    Behavior During Therapy [facility] for tasks assessed/performed             Past Medical History:  Diagnosis Date   Anemia    Arthritis    Asthma    sports induced asthma. takes inhalers when needed   Barrett's esophagus without dysplasia    Cancer (HCC)    Basal and squamoous cell   Chronic kidney disease    Complication of anesthesia    high tolerance to pain medication. body absorbs pain med quickly   Coronary artery disease    Cough on exercise    Diabetes mellitus without complication ([place])    Diverticulosis    Erectile dysfunction    Fatty liver    GERD (gastroesophageal reflux disease)    History of kidney stones    Hyperlipidemia    Hypertension    per patient, he does not have high bp but is treated for his kidneys and his diabetes   Pancreatitis    Right shoulder injury    Sleep apnea    use C-PAP    Past Surgical History:  Procedure Laterality Date   ANKLE GANGLION CYST EXCISION Left    x 5   CARPAL TUNNEL RELEASE Right 10/03/2018   Procedure: CARPAL TUNNEL RELEASE;  Surgeon: Earnestine Leys, MD;  Location: [facility];  Service: Orthopedics;  Laterality: Right;   CARPAL TUNNEL RELEASE Left 10/24/2018   Procedure: CARPAL TUNNEL RELEASE;  Surgeon: Earnestine Leys, MD;  Location: [facility];  Service: Orthopedics;  Laterality: Left;   COLONOSCOPY  05/10/2014   COLONOSCOPY  N/A 12/16/2022   Procedure: COLONOSCOPY;  Surgeon: Lesly Rubenstein, MD;  Location: [facility]COPY;  Service: Endoscopy;  Laterality: N/A;   COLONOSCOPY WITH PROPOFOL N/A 08/30/2019   Procedure: COLONOSCOPY WITH PROPOFOL;  Surgeon: Toledo, Benay Pike, MD;  Location: [facility] ENDOSCOPY;  Service: Gastroenterology;  Laterality: N/A;   CYSTOSCOPY WITH STENT PLACEMENT Bilateral 06/05/2016   Procedure: CYSTOSCOPY WITH STENT PLACEMENT;  Surgeon: Nickie Retort, MD;  Location: [facility];  Service: Urology;  Laterality: Bilateral;   CYSTOSCOPY/URETEROSCOPY/HOLMIUM LASER/STENT PLACEMENT Left 04/13/2016   Procedure: CYSTOSCOPY/RETROGRADE PYELOGRAM/URETEROSCOPY WITH HOLMIUM LASER LITHOTRIPSY//STENT PLACEMENT;  Surgeon: Festus Aloe, MD;  Location: [facility];  Service: Urology;  Laterality: Left;   ESOPHAGOGASTRODUODENOSCOPY N/A 12/16/2022   Procedure: ESOPHAGOGASTRODUODENOSCOPY (EGD);  Surgeon: Lesly Rubenstein, MD;  Location: [facility] ENDOSCOPY;  Service: Endoscopy;  Laterality: N/A;   ESOPHAGOGASTRODUODENOSCOPY (EGD) WITH PROPOFOL N/A 12/27/2015   Procedure: ESOPHAGOGASTRODUODENOSCOPY (EGD) WITH PROPOFOL;  Surgeon: Manya Silvas, MD;  Location[facility]COPY;  Service: Endoscopy;  Laterality: N/A;   ESOPHAGOGASTRODUODENOSCOPY (EGD) WITH PROPOFOL N/A 08/30/2019   Procedure: ESOPHAGOGASTRODUODENOSCOPY (EGD) WITH PROPOFOL;  Surgeon: Toledo, Benay Pike, MD;  Location: [facility] ENDOSCOPY;  Service: Gastroenterology;  Laterality: N/A;   EXTRACORPOREAL SHOCK WAVE LITHOTRIPSY Right 12/30/2017   Procedure:  EXTRACORPOREAL SHOCK WAVE LITHOTRIPSY (ESWL);  Surgeon: Royston Cowper, MD;  Location: [facility];  Service: Urology;  Laterality: Right;   EXTRACORPOREAL SHOCK WAVE LITHOTRIPSY Left 03/19/2022   Procedure: EXTRACORPOREAL SHOCK WAVE LITHOTRIPSY (ESWL);  Surgeon: Royston Cowper, MD;  Location: [facility];  Service: Urology;  Laterality: Left;   EXTRACORPOREAL SHOCK WAVE LITHOTRIPSY Right 08/20/2022   Procedure:  EXTRACORPOREAL SHOCK WAVE LITHOTRIPSY (ESWL);  Surgeon: Royston Cowper, MD;  Location: [facility];  Service: Urology;  Laterality: Right;   GANGLION CYST EXCISION Right 08/12/2022   Procedure: EXCISION OF DORSAL CARPAL GANGLION OF RIGHT WRIST;  Surgeon: Corky Mull, MD;  Location: [facility];  Service: Orthopedics;  Laterality: Right;   HERNIA REPAIR  [id number]   umbilical   KNEE ARTHROSCOPY Right 2015   had bursa sack repaired   KNEE ARTHROSCOPY WITH MEDIAL MENISECTOMY Right 10/11/2020   Procedure: Right knee arthroscopy partial medial meniscectomy;  Surgeon: Leim Fabry, MD;  Location: [facility];  Service: Orthopedics;  Laterality: Right;  Diabetic - oral meds sleep apnea   KNEE ARTHROSCOPY WITH MENISCAL REPAIR Right 11/18/2018   Procedure: KNEE ARTHROSCOPY WITH MENISCAL REPAIR AND CHONDROPLASTY;  Surgeon: Leim Fabry, MD;  Location: [facility];  Service: Orthopedics;  Laterality: Right;  Diabetic - oral meds sleep apnea SUPINE WITH ACL LEG HOLDER SMITH AND NEWPHEW CETERIX   REPAIR EXTENSOR TENDON Right 10/22/2022   Procedure: EXTENSOR TENOSYNOVECTOMY OF RIGHT WRIST;  Surgeon: Corky Mull, MD;  Location: [facility];  Service: Orthopedics;  Laterality: Right;   SHOULDER SURGERY Right 2011   tendon was shredded and was trimmed, repaired and reattached. Screws in shoulder   TOTAL HIP ARTHROPLASTY Left 10/28/2021   Procedure: TOTAL HIP ARTHROPLASTY;  Surgeon: Corky Mull, MD;  Location: [facility];  Service: Orthopedics;  Laterality: Left;   URETEROSCOPY WITH HOLMIUM LASER LITHOTRIPSY Bilateral 06/05/2016   Procedure: URETEROSCOPY WITH HOLMIUM LASER LITHOTRIPSY;  Surgeon: Nickie Retort, MD;  Location: [facility];  Service: Urology;  Laterality: Bilateral;   VASECTOMY  2002    There were no vitals filed for this visit.   Subjective Assessment - 12/28/22 0832     Subjective  I was the whole weekend at the hospital with my F-I-L - and had to sit with him -so I am stiff and  very tired - tried to do some of my exercises    Pertinent History 11/20/22 Ortho note : [name] is a 57 y.o. male who presents today for a repeat skin check status post a revision extensive tenosynovectomy involving the dorsal aspect of the right wrist. The patient is now 4 weeks status post surgery, surgery was performed by Dr. Roland Rack on 10/22/2022. At his initial postop visit the patient was instructed to continue to wear the Velcro wrist splint routinely except for showers and continue with the Ace wrap applied to the right wrist to minimize the amount of swelling along the dorsal aspect of the right wrist. The patient denies any trauma or injury affecting right wrist since his procedure. He denies any signs of infection at home such as fevers chills or any drainage from the dorsal aspect of the right wrist. The patient has not noticed any significant focal swelling like he did after his initial tenosynovectomy. The patient has been out of work since the surgery. He does still have swelling noted in his fingers but states that this is improving at this time. The patient reports a 2 out of 10 pain  score in the right upper extremity at today's visit. He is not taking any medication consistently for the right wrist at this time. REfer to OT    Patient Stated Goals Want my motion and strength back in my R wrist so I can go back to work    Currently in Pain? No/denies                [facility] OT Assessment - 12/28/22 0001       AROM   Right Wrist Extension 60 Degrees    Right Wrist Flexion 60 Degrees           Grip assess last visit Wrist extention close fist 70 and WB /push up can do 90 with slight pull on volar wrist Flexion with making fist 70 coming in  Focus today on flexion stretch - paraffin and BTE           OT Treatments/Exercises (OP) - 12/28/22 0001       RUE Paraffin   Number Minutes Paraffin 8 Minutes    RUE Paraffin Location Wrist    Comments flexion - 2 x 2  min - 1 x 2 min ext            Done some soft tissue mobs to dorsal wrist - as well as some gentle traction and joint mobs to wrist - as well as some light carpal rolls in weightbearing  Prior to wrist  flexion stretches      Great progress after paraffin and soft tissue again in session      Cont at home table slides for wrist extention pain free 20 reps  at home CPM for wrist  flexion 200 sec Followed by  BTE wrist flexion with grip tool nr 701 2x 120 sec Able to get 80 degrees flexion At 30 lbs   Pain free   Great progress in AROM  2 x day after his motion  Cont green putty for pulling lumbrical fist and twisting - back and forth  15 reps  Pulling in to RD and UD , and wrist flexion, extention  15 reps  Combining wrist and gripping  Pain free  Cont with wrist 2 lbs HEP      No splint but Benik neoprene during day with heavy activities               OT Education - 12/28/22 0833     Education Details progress and changes  HEP    Person(s) Educated Patient    Methods Explanation;Demonstration;Tactile cues;Verbal cues;Handout    Comprehension Verbalized understanding;Returned demonstration;Verbal cues required              OT Short Term Goals - 11/27/22 1631       OT SHORT TERM GOAL #1   Title Patient to be independent in home program to decrease scar tissue and edema and increase wrist flexion extension.    Baseline No knowledge of home program.  Wrist extension 35, flat 25 to wrist.    Time 3    Period Weeks    Status New    Target Date 12/18/22               OT Long Term Goals - 11/27/22 1631       OT LONG TERM GOAL #1   Title Right wrist flexion extension increased to within functional limits for patient to push and pull heavy door, twist and turn knobs without increase symptoms    Baseline  Right wrist extension 35 degrees, and 25 degrees.  Pain with functional use can increase to 3/10.  With sometimes shooting or sharp pain 7/10.     Time 6    Period Weeks    Status New    Target Date 01/08/23      OT LONG TERM GOAL #2   Title Strength in the right wrist increased to 5/5 for patient to return back to work without increase symptoms    Baseline Patient 5 weeks postop flexion 25, extension 35 degrees.  Pain 3-7/10.  No strengthening yet.    Time 6    Period Weeks    Status New    Target Date 01/08/23      OT LONG TERM GOAL #3   Title Right grip and prehension strength increased to more than 75% compared to the left with increased symptoms to return back to work.    Baseline Not tested patient 5 weeks postop limited flexion extension of the wrist to 25-35 degrees.  In 3- 7/10 at rest    Time 6    Period Weeks    Status New    Target Date 01/08/23                   Plan - 12/28/22 0834     Clinical Impression Statement Patient  post right dorsal wrist tenosynovectomy 10/22/22.  Patient had a cyst removed in October 23.  Patient present at eval with decreased right dominant wrist flexion/ extension, increased scar tissue increased pain with some edema in hand/ wrist.  Patient limited in functional use of right dominant hand in ADLs and IADLs as well as to return to work. Pt making great progress in edema and pain -as well as increase flexion , ext of wrist every  session.  Wrist extention 70 and can do WB thru palm but some discomfort at volar wrist. This date was able to improve wrist flexion to 80 degrees - COnt to have some tenderness  where 2 scars cross with wrist extention and scar mobs proximally. Pt doing well with strenghtening and pain free AROM- mostly tightness at end range flexion, extention.   Pt to cont using paraffin at home with stretches. Patient can benefit from skilled OT services to increase motion, increase scar tissue and pain increase strength to return back to prior level of function.    OT Occupational Profile and History Problem Focused Assessment - Including review of records relating to  presenting problem    Occupational performance deficits (Please refer to evaluation for details): ADL's;IADL's;Work;Social Participation;Leisure;Play    Body Structure / Function / Physical Skills ADL;Scar mobility;UE functional use;Flexibility;IADL;Pain;Strength;Edema;ROM    Rehab Potential Good    Clinical Decision Making Limited treatment options, no task modification necessary    Comorbidities Affecting Occupational Performance: None    Modification or Assistance to Complete Evaluation  No modification of tasks or assist necessary to complete eval    OT Frequency 2x / week    OT Duration 4 weeks    OT Treatment/Interventions Self-care/ADL training;Moist Heat;Paraffin;Fluidtherapy;Contrast Bath;Passive range of motion;Therapeutic activities;Splinting;Scar mobilization;Manual Therapy;Therapeutic exercise;Patient/family education    Consulted and Agree with Plan of Care Patient             Patient will benefit from skilled therapeutic intervention in order to improve the following deficits and impairments:   Body Structure / Function / Physical Skills: ADL, Scar mobility, UE functional use, Flexibility, IADL, Pain, Strength, Edema, ROM  Visit Diagnosis: Pain in right wrist  Scar condition and fibrosis of skin  Muscle weakness (generalized)  Stiffness of right wrist, not elsewhere classified    Problem List Patient Active Problem List   Diagnosis Date Noted   Acute GI bleeding 12/15/2022   Status post total hip replacement, left 10/28/2021   Dyslipidemia 08/24/2021   Status post colonoscopy 06/02/2019   Hepatic steatosis 06/02/2019   Barrett's esophagus without dysplasia 01/19/2019   High coronary artery calcium score 12/29/2018   Chronic iron deficiency anemia 12/22/2017   Gastritis without bleeding 06/25/2017   Vitamin B12 deficiency 08/03/2016   OSA on CPAP 04/18/2016   Nephrolithiasis 04/13/2016   Chronic midline low back pain without sciatica 01/25/2016    Primary osteoarthritis involving multiple joints 11/20/2015   Bilateral carpal tunnel syndrome 09/14/2015   Essential hypertension 09/14/2015   Chronic knee pain 08/30/2014   Erectile dysfunction 08/30/2014   Other allergic rhinitis 08/30/2014   Onychomycosis of toenail 07/24/2014   Hyperlipidemia associated with type 2 diabetes mellitus ([place]) 04/23/2014   Type II diabetes mellitus with complication ([place]) [id number]    Rosalyn Gess, OTR/L,CLT 12/28/2022, 1:09 PM  [facility] [address], [place], [address] Phone: [phone]   Fax:  [phone]  Name: NEBIYU GERHART MRN: [id number] Date of Birth: Nov 08, 1966

## 2022-12-31 ENCOUNTER — Ambulatory Visit: Payer: Managed Care, Other (non HMO) | Admitting: Occupational Therapy

## 2022-12-31 DIAGNOSIS — M25531 Pain in right wrist: Secondary | ICD-10-CM | POA: Diagnosis not present

## 2022-12-31 DIAGNOSIS — L905 Scar conditions and fibrosis of skin: Secondary | ICD-10-CM

## 2022-12-31 DIAGNOSIS — M6281 Muscle weakness (generalized): Secondary | ICD-10-CM

## 2022-12-31 DIAGNOSIS — M25631 Stiffness of right wrist, not elsewhere classified: Secondary | ICD-10-CM

## 2022-12-31 NOTE — Therapy (Signed)
[facility] [address] [place], [address] Phone: [phone]   Fax:  [phone]  Occupational Therapy Treatment  Patient Details  Name: [place] MRN: [id number] Date of Birth: 09-12-1966 Referring Provider (OT): Henning PA   Encounter Date: 12/31/2022   OT End of Session - 12/31/22 0834     Visit Number 11    Number of Visits 12    Date for OT Re-Evaluation 01/08/23    OT Start Time 0815    OT Stop Time 0856    OT Time Calculation (min) 41 min    Activity Tolerance Patient tolerated treatment well    Behavior During Therapy [facility] for tasks assessed/performed             Past Medical History:  Diagnosis Date   Anemia    Arthritis    Asthma    sports induced asthma. takes inhalers when needed   Barrett's esophagus without dysplasia    Cancer (HCC)    Basal and squamoous cell   Chronic kidney disease    Complication of anesthesia    high tolerance to pain medication. body absorbs pain med quickly   Coronary artery disease    Cough on exercise    Diabetes mellitus without complication ([place])    Diverticulosis    Erectile dysfunction    Fatty liver    GERD (gastroesophageal reflux disease)    History of kidney stones    Hyperlipidemia    Hypertension    per patient, he does not have high bp but is treated for his kidneys and his diabetes   Pancreatitis    Right shoulder injury    Sleep apnea    use C-PAP    Past Surgical History:  Procedure Laterality Date   ANKLE GANGLION CYST EXCISION Left    x 5   CARPAL TUNNEL RELEASE Right 10/03/2018   Procedure: CARPAL TUNNEL RELEASE;  Surgeon: Earnestine Leys, MD;  Location: [facility]S;  Service: Orthopedics;  Laterality: Right;   CARPAL TUNNEL RELEASE Left 10/24/2018   Procedure: CARPAL TUNNEL RELEASE;  Surgeon: Earnestine Leys, MD;  Location: [facility];  Service: Orthopedics;  Laterality: Left;   COLONOSCOPY  05/10/2014   COLONOSCOPY N/A 12/16/2022    Procedure: COLONOSCOPY;  Surgeon: Lesly Rubenstein, MD;  Location: [facility] ENDOSCOPY;  Service: Endoscopy;  Laterality: N/A;   COLONOSCOPY WITH PROPOFOL N/A 08/30/2019   Procedure: COLONOSCOPY WITH PROPOFOL;  Surgeon: Toledo, Benay Pike, MD;  Location: [facility] ENDOSCOPY;  Service: Gastroenterology;  Laterality: N/A;   CYSTOSCOPY WITH STENT PLACEMENT Bilateral 06/05/2016   Procedure: CYSTOSCOPY WITH STENT PLACEMENT;  Surgeon: Nickie Retort, MD;  Location: [facility];  Service: Urology;  Laterality: Bilateral;   CYSTOSCOPY/URETEROSCOPY/HOLMIUM LASER/STENT PLACEMENT Left 04/13/2016   Procedure: CYSTOSCOPY/RETROGRADE PYELOGRAM/URETEROSCOPY WITH HOLMIUM LASER LITHOTRIPSY//STENT PLACEMENT;  Surgeon: Festus Aloe, MD;  Location: [facility];  Service: Urology;  Laterality: Left;   ESOPHAGOGASTRODUODENOSCOPY N/A 12/16/2022   Procedure: ESOPHAGOGASTRODUODENOSCOPY (EGD);  Surgeon: Lesly Rubenstein, MD;  Location: [facility] ENDOSCOPY;  Service: Endoscopy;  Laterality: N/A;   ESOPHAGOGASTRODUODENOSCOPY (EGD) WITH PROPOFOL N/A 12/27/2015   Procedure: ESOPHAGOGASTRODUODENOSCOPY (EGD) WITH PROPOFOL;  Surgeon: Manya Silvas, MD;  Location: [facility]OSCOPY;  Service: Endoscopy;  Laterality: N/A;   ESOPHAGOGASTRODUODENOSCOPY (EGD) WITH PROPOFOL N/A 08/30/2019   Procedure: ESOPHAGOGASTRODUODENOSCOPY (EGD) WITH PROPOFOL;  Surgeon: Toledo, Benay Pike, MD;  Location: [facility] ENDOSCOPY;  Service: Gastroenterology;  Laterality: N/A;   EXTRACORPOREAL SHOCK WAVE LITHOTRIPSY Right 12/30/2017   Procedure: EXTRACORPOREAL  SHOCK WAVE LITHOTRIPSY (ESWL);  Surgeon: Royston Cowper, MD;  Location: [facility];  Service: Urology;  Laterality: Right;   EXTRACORPOREAL SHOCK WAVE LITHOTRIPSY Left 03/19/2022   Procedure: EXTRACORPOREAL SHOCK WAVE LITHOTRIPSY (ESWL);  Surgeon: Royston Cowper, MD;  Location: [facility];  Service: Urology;  Laterality: Left;   EXTRACORPOREAL SHOCK WAVE LITHOTRIPSY Right 08/20/2022   Procedure: EXTRACORPOREAL  SHOCK WAVE LITHOTRIPSY (ESWL);  Surgeon: Royston Cowper, MD;  Location: [facility];  Service: Urology;  Laterality: Right;   GANGLION CYST EXCISION Right 08/12/2022   Procedure: EXCISION OF DORSAL CARPAL GANGLION OF RIGHT WRIST;  Surgeon: Corky Mull, MD;  Location: [facility];  Service: Orthopedics;  Laterality: Right;   HERNIA REPAIR  [id number]   umbilical   KNEE ARTHROSCOPY Right 2015   had bursa sack repaired   KNEE ARTHROSCOPY WITH MEDIAL MENISECTOMY Right 10/11/2020   Procedure: Right knee arthroscopy partial medial meniscectomy;  Surgeon: Leim Fabry, MD;  Location: [facility];  Service: Orthopedics;  Laterality: Right;  Diabetic - oral meds sleep apnea   KNEE ARTHROSCOPY WITH MENISCAL REPAIR Right 11/18/2018   Procedure: KNEE ARTHROSCOPY WITH MENISCAL REPAIR AND CHONDROPLASTY;  Surgeon: Leim Fabry, MD;  Location: [facility];  Service: Orthopedics;  Laterality: Right;  Diabetic - oral meds sleep apnea SUPINE WITH ACL LEG HOLDER SMITH AND NEWPHEW CETERIX   REPAIR EXTENSOR TENDON Right 10/22/2022   Procedure: EXTENSOR TENOSYNOVECTOMY OF RIGHT WRIST;  Surgeon: Corky Mull, MD;  Location: [facility];  Service: Orthopedics;  Laterality: Right;   SHOULDER SURGERY Right 2011   tendon was shredded and was trimmed, repaired and reattached. Screws in shoulder   TOTAL HIP ARTHROPLASTY Left 10/28/2021   Procedure: TOTAL HIP ARTHROPLASTY;  Surgeon: Corky Mull, MD;  Location: [facility];  Service: Orthopedics;  Laterality: Left;   URETEROSCOPY WITH HOLMIUM LASER LITHOTRIPSY Bilateral 06/05/2016   Procedure: URETEROSCOPY WITH HOLMIUM LASER LITHOTRIPSY;  Surgeon: Nickie Retort, MD;  Location: [facility];  Service: Urology;  Laterality: Bilateral;   VASECTOMY  2002    There were no vitals filed for this visit.   Subjective Assessment - 12/31/22 0832     Subjective  [place]se [place] came home from the hospital -but wrist do not hurt really but just tight - when I was sitting in  the hospital I could only do the putty    Pertinent History 11/20/22 Ortho note : Jesse [place] is a 57 y.o. male who presents today for a repeat skin check status post a revision extensive tenosynovectomy involving the dorsal aspect of the right wrist. The patient is now 4 weeks status post surgery, surgery was performed by Dr. Roland Rack on 10/22/2022. At his initial postop visit the patient was instructed to continue to wear the Velcro wrist splint routinely except for showers and continue with the Ace wrap applied to the right wrist to minimize the amount of swelling along the dorsal aspect of the right wrist. The patient denies any trauma or injury affecting right wrist since his procedure. He denies any signs of infection at home such as fevers chills or any drainage from the dorsal aspect of the right wrist. The patient has not noticed any significant focal swelling like he did after his initial tenosynovectomy. The patient has been out of work since the surgery. He does still have swelling noted in his fingers but states that this is improving at this time. The patient reports a 2 out of 10 pain score in  the right upper extremity at today's visit. He is not taking any medication consistently for the right wrist at this time. REfer to OT    Patient Stated Goals Want Jesse motion and strength back in Jesse R wrist so I can go back to work    Currently in Pain? No/denies                [facility] OT Assessment - 12/31/22 0001       AROM   Right Wrist Extension 58 Degrees   70 loose fist   Right Wrist Flexion 75 Degrees   open hand 75     Strength   Right Hand Grip (lbs) 85    Right Hand Lateral Pinch 26 lbs    Right Hand 3 Point Pinch 24 lbs    Left Hand Grip (lbs) 85    Left Hand Lateral Pinch 22 lbs    Left Hand 3 Point Pinch 17 lbs             Still some tenderness with scar massage prox <> distal on ulnar side of where 2 scars crosses - with wrist and digits in extention but improving           OT Treatments/Exercises (OP) - 12/31/22 0001       RUE Paraffin   Number Minutes Paraffin 8 Minutes    RUE Paraffin Location Wrist    Comments flexion, ext stretch 2 min each             Done some soft tissue mobs to dorsal wrist - as well as some gentle traction and joint mobs to wrist - and light carpal rolls in weightbearing  Prior to wrist stretches      Great progress after paraffin and soft tissue again in session      Cont at home table slides for wrist extention pain free 20 reps  at home PROM flexion and extention - open hand and loose fist  Circular AROM in both direction -and with 1lbs weight 20 reps Cont with 2 lbs for sup/pro and RD, UD  2 x 15 reps Hold off on putty- maybe over done it when was sitting in hospital with family - causing some tightness over long extensors         OT Education - 12/31/22 0834     Education Details progress and changes  HEP    Person(s) Educated Patient    Methods Explanation;Demonstration;Tactile cues;Verbal cues;Handout    Comprehension Verbalized understanding;Returned demonstration;Verbal cues required              OT Short Term Goals - 11/27/22 1631       OT SHORT TERM GOAL #1   Title Patient to be independent in home program to decrease scar tissue and edema and increase wrist flexion extension.    Baseline No knowledge of home program.  Wrist extension 35, flat 25 to wrist.    Time 3    Period Weeks    Status New    Target Date 12/18/22               OT Long Term Goals - 11/27/22 1631       OT LONG TERM GOAL #1   Title Right wrist flexion extension increased to within functional limits for patient to push and pull heavy door, twist and turn knobs without increase symptoms    Baseline Right wrist extension 35 degrees, and 25 degrees.  Pain with functional use can  increase to 3/10.  With sometimes shooting or sharp pain 7/10.    Time 6    Period Weeks    Status New    Target Date  01/08/23      OT LONG TERM GOAL #2   Title Strength in the right wrist increased to 5/5 for patient to return back to work without increase symptoms    Baseline Patient 5 weeks postop flexion 25, extension 35 degrees.  Pain 3-7/10.  No strengthening yet.    Time 6    Period Weeks    Status New    Target Date 01/08/23      OT LONG TERM GOAL #3   Title Right grip and prehension strength increased to more than 75% compared to the left with increased symptoms to return back to work.    Baseline Not tested patient 5 weeks postop limited flexion extension of the wrist to 25-35 degrees.  In 3- 7/10 at rest    Time 6    Period Weeks    Status New    Target Date 01/08/23                   Plan - 12/31/22 0834     Clinical Impression Statement Patient  post right dorsal wrist tenosynovectomy 10/22/22.  Patient had a cyst removed in October 23.  Patient present at eval with decreased right dominant wrist flexion/ extension, increased scar tissue increased pain with some edema in hand/ wrist.  Patient limited in functional use of right dominant hand in ADLs and IADLs as well as to return to work. Pt making great progress in edema and pain -as well as increase flexion , ext of wrist every  session.  Wrist extention 70 and can do WB thru palm. Wrist flexion  80 degrees this week- pt complain more of stiffness than pain- pt to hold off on puttty this weekend - he was with father in [place] in hospital and could not really do HEP - done some ROM and putty. Cont to  have some tenderness  where 2 scars cross with wrist extention and digits extention - and scar massage. Pt doing well with strenghtening and pain free AROM.  Pt to cont using paraffin at home with stretches. Patient can benefit from skilled OT services to increase motion, increase scar tissue and pain increase strength to return back to prior level of function.    OT Occupational Profile and History Problem Focused Assessment - Including  review of records relating to presenting problem    Occupational performance deficits (Please refer to evaluation for details): ADL's;IADL's;Work;Social Participation;Leisure;Play    Body Structure / Function / Physical Skills ADL;Scar mobility;UE functional use;Flexibility;IADL;Pain;Strength;Edema;ROM    Rehab Potential Good    Clinical Decision Making Limited treatment options, no task modification necessary    Comorbidities Affecting Occupational Performance: None    Modification or Assistance to Complete Evaluation  No modification of tasks or assist necessary to complete eval    OT Frequency 2x / week    OT Duration 4 weeks    OT Treatment/Interventions Self-care/ADL training;Moist Heat;Paraffin;Fluidtherapy;Contrast Bath;Passive range of motion;Therapeutic activities;Splinting;Scar mobilization;Manual Therapy;Therapeutic exercise;Patient/family education    Consulted and Agree with Plan of Care Patient             Patient will benefit from skilled therapeutic intervention in order to improve the following deficits and impairments:   Body Structure / Function / Physical Skills: ADL, Scar mobility, UE functional use, Flexibility, IADL, Pain, Strength,  Edema, ROM       Visit Diagnosis: Pain in right wrist  Scar condition and fibrosis of skin  Muscle weakness (generalized)  Stiffness of right wrist, not elsewhere classified    Problem List Patient Active Problem List   Diagnosis Date Noted   Acute GI bleeding 12/15/2022   Status post total hip replacement, left 10/28/2021   Dyslipidemia 08/24/2021   Status post colonoscopy 06/02/2019   Hepatic steatosis 06/02/2019   Barrett's esophagus without dysplasia 01/19/2019   High coronary artery calcium score 12/29/2018   Chronic iron deficiency anemia 12/22/2017   Gastritis without bleeding 06/25/2017   Vitamin B12 deficiency 08/03/2016   OSA on CPAP 04/18/2016   Nephrolithiasis 04/13/2016   Chronic midline low back pain  without sciatica 01/25/2016   Primary osteoarthritis involving multiple joints 11/20/2015   Bilateral carpal tunnel syndrome 09/14/2015   Essential hypertension 09/14/2015   Chronic knee pain 08/30/2014   Erectile dysfunction 08/30/2014   Other allergic rhinitis 08/30/2014   Onychomycosis of toenail 07/24/2014   Hyperlipidemia associated with type 2 diabetes mellitus ([place]) 04/23/2014   Type II diabetes mellitus with complication ([place]) [id number]    Rosalyn Gess, OTR/l,CLT 12/31/2022, 9:03 AM  [facility] [address], [place], [address] Phone: [phone]   Fax:  [phone]  Name: ORRIN SIMMONDS MRN: [id number] Date of Birth: 1965-11-19

## 2023-01-05 ENCOUNTER — Ambulatory Visit: Payer: Managed Care, Other (non HMO) | Admitting: Occupational Therapy

## 2023-01-05 DIAGNOSIS — L905 Scar conditions and fibrosis of skin: Secondary | ICD-10-CM

## 2023-01-05 DIAGNOSIS — M6281 Muscle weakness (generalized): Secondary | ICD-10-CM

## 2023-01-05 DIAGNOSIS — M25631 Stiffness of right wrist, not elsewhere classified: Secondary | ICD-10-CM

## 2023-01-05 DIAGNOSIS — M25531 Pain in right wrist: Secondary | ICD-10-CM | POA: Diagnosis not present

## 2023-01-05 NOTE — Therapy (Signed)
[facility] [address], [place], [address] Phone: [phone]   Fax:  [phone]  Occupational Therapy Treatment  Patient Details  Name: Jesse Alvarado MRN: [id number] Date of Birth: 1966/06/19 Referring Provider (OT): [place] PA   Encounter Date: 01/05/2023   OT End of Session - 01/05/23 0830     Visit Number 12    Number of Visits 12    Date for OT Re-Evaluation 01/08/23    OT Start Time 0820    OT Stop Time 0855    OT Time Calculation (min) 35 min    Activity Tolerance Patient tolerated treatment well    Behavior During Therapy [facility] for tasks assessed/performed             Past Medical History:  Diagnosis Date   Anemia    Arthritis    Asthma    sports induced asthma. takes inhalers when needed   Barrett's esophagus without dysplasia    Cancer (HCC)    Basal and squamoous cell   Chronic kidney disease    Complication of anesthesia    high tolerance to pain medication. body absorbs pain med quickly   Coronary artery disease    Cough on exercise    Diabetes mellitus without complication ([place])    Diverticulosis    Erectile dysfunction    Fatty liver    GERD (gastroesophageal reflux disease)    History of kidney stones    Hyperlipidemia    Hypertension    per patient, he does not have high bp but is treated for his kidneys and his diabetes   Pancreatitis    Right shoulder injury    Sleep apnea    use C-PAP    Past Surgical History:  Procedure Laterality Date   ANKLE GANGLION CYST EXCISION Left    x 5   CARPAL TUNNEL RELEASE Right 10/03/2018   Procedure: CARPAL TUNNEL RELEASE;  Surgeon: Earnestine Leys, MD;  Location: [facility];  Service: Orthopedics;  Laterality: Right;   CARPAL TUNNEL RELEASE Left 10/24/2018   Procedure: CARPAL TUNNEL RELEASE;  Surgeon: Earnestine Leys, MD;  Location: [facility];  Service: Orthopedics;  Laterality: Left;   COLONOSCOPY  05/10/2014   COLONOSCOPY N/A 12/16/2022    Procedure: COLONOSCOPY;  Surgeon: Lesly Rubenstein, MD;  Location: [facility];  Service: Endoscopy;  Laterality: N/A;   COLONOSCOPY WITH PROPOFOL N/A 08/30/2019   Procedure: COLONOSCOPY WITH PROPOFOL;  Surgeon: Toledo, Benay Pike, MD;  Location: [facility] ENDOSCOPY;  Service: Gastroenterology;  Laterality: N/A;   CYSTOSCOPY WITH STENT PLACEMENT Bilateral 06/05/2016   Procedure: CYSTOSCOPY WITH STENT PLACEMENT;  Surgeon: Nickie Retort, MD;  Location: [facility];  Service: Urology;  Laterality: Bilateral;   CYSTOSCOPY/URETEROSCOPY/HOLMIUM LASER/STENT PLACEMENT Left 04/13/2016   Procedure: CYSTOSCOPY/RETROGRADE PYELOGRAM/URETEROSCOPY WITH HOLMIUM LASER LITHOTRIPSY//STENT PLACEMENT;  Surgeon: Festus Aloe, MD;  Location: [facility];  Service: Urology;  Laterality: Left;   ESOPHAGOGASTRODUODENOSCOPY N/A 12/16/2022   Procedure: ESOPHAGOGASTRODUODENOSCOPY (EGD);  Surgeon: Lesly Rubenstein, MD;  Location: [facility]COPY;  Service: Endoscopy;  Laterality: N/A;   ESOPHAGOGASTRODUODENOSCOPY (EGD) WITH PROPOFOL N/A 12/27/2015   Procedure: ESOPHAGOGASTRODUODENOSCOPY (EGD) WITH PROPOFOL;  Surgeon: Manya Silvas, MD;  Location: [facility] ENDOSCOPY;  Service: Endoscopy;  Laterality: N/A;   ESOPHAGOGASTRODUODENOSCOPY (EGD) WITH PROPOFOL N/A 08/30/2019   Procedure: ESOPHAGOGASTRODUODENOSCOPY (EGD) WITH PROPOFOL;  Surgeon: Toledo, Benay Pike, MD;  Location: [facility];  Service: Gastroenterology;  Laterality: N/A;   EXTRACORPOREAL SHOCK WAVE LITHOTRIPSY Right 12/30/2017   Procedure: EXTRACORPOREAL  SHOCK WAVE LITHOTRIPSY (ESWL);  Surgeon: Royston Cowper, MD;  Location: [facility];  Service: Urology;  Laterality: Right;   EXTRACORPOREAL SHOCK WAVE LITHOTRIPSY Left 03/19/2022   Procedure: EXTRACORPOREAL SHOCK WAVE LITHOTRIPSY (ESWL);  Surgeon: Royston Cowper, MD;  Location: [facility];  Service: Urology;  Laterality: Left;   EXTRACORPOREAL SHOCK WAVE LITHOTRIPSY Right 08/20/2022   Procedure: EXTRACORPOREAL  SHOCK WAVE LITHOTRIPSY (ESWL);  Surgeon: Royston Cowper, MD;  Location: [facility];  Service: Urology;  Laterality: Right;   GANGLION CYST EXCISION Right 08/12/2022   Procedure: EXCISION OF DORSAL CARPAL GANGLION OF RIGHT WRIST;  Surgeon: Corky Mull, MD;  Location: [facility];  Service: Orthopedics;  Laterality: Right;   HERNIA REPAIR  [id number]   umbilical   KNEE ARTHROSCOPY Right 2015   had bursa sack repaired   KNEE ARTHROSCOPY WITH MEDIAL MENISECTOMY Right 10/11/2020   Procedure: Right knee arthroscopy partial medial meniscectomy;  Surgeon: Leim Fabry, MD;  Location: [facility];  Service: Orthopedics;  Laterality: Right;  Diabetic - oral meds sleep apnea   KNEE ARTHROSCOPY WITH MENISCAL REPAIR Right 11/18/2018   Procedure: KNEE ARTHROSCOPY WITH MENISCAL REPAIR AND CHONDROPLASTY;  Surgeon: Leim Fabry, MD;  Location: [facility];  Service: Orthopedics;  Laterality: Right;  Diabetic - oral meds sleep apnea SUPINE WITH ACL LEG HOLDER SMITH AND NEWPHEW CETERIX   REPAIR EXTENSOR TENDON Right 10/22/2022   Procedure: EXTENSOR TENOSYNOVECTOMY OF RIGHT WRIST;  Surgeon: Corky Mull, MD;  Location: [facility];  Service: Orthopedics;  Laterality: Right;   SHOULDER SURGERY Right 2011   tendon was shredded and was trimmed, repaired and reattached. Screws in shoulder   TOTAL HIP ARTHROPLASTY Left 10/28/2021   Procedure: TOTAL HIP ARTHROPLASTY;  Surgeon: Corky Mull, MD;  Location: [facility];  Service: Orthopedics;  Laterality: Left;   URETEROSCOPY WITH HOLMIUM LASER LITHOTRIPSY Bilateral 06/05/2016   Procedure: URETEROSCOPY WITH HOLMIUM LASER LITHOTRIPSY;  Surgeon: Nickie Retort, MD;  Location: [facility];  Service: Urology;  Laterality: Bilateral;   VASECTOMY  2002    There were no vitals filed for this visit.   Subjective Assessment - 01/05/23 0829     Subjective  Was up last night with father in law -did not sleep to much -did put light up last night    Pertinent History  11/20/22 Ortho note : Jesse Alvarado is a 57 y.o. male who presents today for a repeat skin check status post a revision extensive tenosynovectomy involving the dorsal aspect of the right wrist. The patient is now 4 weeks status post surgery, surgery was performed by Dr. Roland Rack on 10/22/2022. At his initial postop visit the patient was instructed to continue to wear the Velcro wrist splint routinely except for showers and continue with the Ace wrap applied to the right wrist to minimize the amount of swelling along the dorsal aspect of the right wrist. The patient denies any trauma or injury affecting right wrist since his procedure. He denies any signs of infection at home such as fevers chills or any drainage from the dorsal aspect of the right wrist. The patient has not noticed any significant focal swelling like he did after his initial tenosynovectomy. The patient has been out of work since the surgery. He does still have swelling noted in his fingers but states that this is improving at this time. The patient reports a 2 out of 10 pain score in the right upper extremity at today's visit. He is not taking any medication  consistently for the right wrist at this time. REfer to OT    Patient Stated Goals Want my motion and strength back in my R wrist so I can go back to work    Currently in Pain? No/denies                        Pt little more stiff coming in this am - report was putting light up late last night- did not sleep with cica scar pad and isotoner glove  OT Treatments/Exercises (OP) - 01/05/23 0001       RUE Paraffin   Number Minutes Paraffin 8 Minutes    RUE Paraffin Location Wrist    Comments flexion, ext stretch 2  2 min            Done some soft tissue mobs to dorsal wrist - as well as some gentle traction and joint mobs to wrist - and light carpal rolls in weightbearing  Prior to wrist stretches  Focus on scar massage with wrist  and digits in extention -use  coban to do scar mobs proximal<> distally -  Done also opposite directions with flexion and extention  Pt to only do at home circular and lateral scar mobs with wrist and digits rested in extention   Keep pain under 2/10     Great progress after paraffin and soft tissue again in session      Cont at home table slides for wrist extention pain free 20 reps  at home PROM flexion and extention - open hand and loose fist   Circular AROM in both direction -and with 1lbs weight 20 reps Cont with 2 lbs for sup/pro and RD, UD  2 x 15 reps      provided new cica scar pad - to use night time under compression glove           OT Education - 01/05/23 0830     Education Details progress and changes  HEP    Person(s) Educated Patient    Methods Explanation;Demonstration;Tactile cues;Verbal cues;Handout    Comprehension Verbalized understanding;Returned demonstration;Verbal cues required              OT Short Term Goals - 11/27/22 1631       OT SHORT TERM GOAL #1   Title Patient to be independent in home program to decrease scar tissue and edema and increase wrist flexion extension.    Baseline No knowledge of home program.  Wrist extension 35, flat 25 to wrist.    Time 3    Period Weeks    Status New    Target Date 12/18/22               OT Long Term Goals - 11/27/22 1631       OT LONG TERM GOAL #1   Title Right wrist flexion extension increased to within functional limits for patient to push and pull heavy door, twist and turn knobs without increase symptoms    Baseline Right wrist extension 35 degrees, and 25 degrees.  Pain with functional use can increase to 3/10.  With sometimes shooting or sharp pain 7/10.    Time 6    Period Weeks    Status New    Target Date 01/08/23      OT LONG TERM GOAL #2   Title Strength in the right wrist increased to 5/5 for patient to return back to work without increase symptoms  Baseline Patient 5 weeks postop flexion 25,  extension 35 degrees.  Pain 3-7/10.  No strengthening yet.    Time 6    Period Weeks    Status New    Target Date 01/08/23      OT LONG TERM GOAL #3   Title Right grip and prehension strength increased to more than 75% compared to the left with increased symptoms to return back to work.    Baseline Not tested patient 5 weeks postop limited flexion extension of the wrist to 25-35 degrees.  In 3- 7/10 at rest    Time 6    Period Weeks    Status New    Target Date 01/08/23                   Plan - 01/05/23 0830     Clinical Impression Statement Patient  post right dorsal wrist tenosynovectomy 10/22/22.  Patient had a cyst removed in October 23.  Patient present at eval with decreased right dominant wrist flexion/ extension, increased scar tissue increased pain with some edema in hand/ wrist.  Patient limited in functional use of right dominant hand in ADLs and IADLs as well as to return to work. Pt making great progress in edema and pain -as well as increase flexion , ext of wrist every  session.  Wrist extention 70 close fist and can do WB thru palm. Wrist flexion  80-85 degrees this week- pt complain more of stiffness than pain. Cont to  have some tenderness  where 2 scars cross with wrist extention and digits extention - and scar massage prox<>distal. Pt doing well with strenghtening and pain free AROM.  Pt to cont using paraffin at home with stretches. Patient can benefit from skilled OT services to increase motion, increase scar tissue and pain increase strength to return back to prior level of function.    OT Occupational Profile and History Problem Focused Assessment - Including review of records relating to presenting problem    Occupational performance deficits (Please refer to evaluation for details): ADL's;IADL's;Work;Social Participation;Leisure;Play    Body Structure / Function / Physical Skills ADL;Scar mobility;UE functional use;Flexibility;IADL;Pain;Strength;Edema;ROM     Rehab Potential Good    Clinical Decision Making Limited treatment options, no task modification necessary    Comorbidities Affecting Occupational Performance: None    Modification or Assistance to Complete Evaluation  No modification of tasks or assist necessary to complete eval    OT Frequency 2x / week    OT Duration 4 weeks    OT Treatment/Interventions Self-care/ADL training;Moist Heat;Paraffin;Fluidtherapy;Contrast Bath;Passive range of motion;Therapeutic activities;Splinting;Scar mobilization;Manual Therapy;Therapeutic exercise;Patient/family education    Consulted and Agree with Plan of Care Patient             Patient will benefit from skilled therapeutic intervention in order to improve the following deficits and impairments:   Body Structure / Function / Physical Skills: ADL, Scar mobility, UE functional use, Flexibility, IADL, Pain, Strength, Edema, ROM       Visit Diagnosis: Pain in right wrist  Scar condition and fibrosis of skin  Muscle weakness (generalized)  Stiffness of right wrist, not elsewhere classified    Problem List Patient Active Problem List   Diagnosis Date Noted   Acute GI bleeding 12/15/2022   Status post total hip replacement, left 10/28/2021   Dyslipidemia 08/24/2021   Status post colonoscopy 06/02/2019   Hepatic steatosis 06/02/2019   Barrett's esophagus without dysplasia 01/19/2019   High coronary artery calcium score 12/29/2018  Chronic iron deficiency anemia 12/22/2017   Gastritis without bleeding 06/25/2017   Vitamin B12 deficiency 08/03/2016   OSA on CPAP 04/18/2016   Nephrolithiasis 04/13/2016   Chronic midline low back pain without sciatica 01/25/2016   Primary osteoarthritis involving multiple joints 11/20/2015   Bilateral carpal tunnel syndrome 09/14/2015   Essential hypertension 09/14/2015   Chronic knee pain 08/30/2014   Erectile dysfunction 08/30/2014   Other allergic rhinitis 08/30/2014   Onychomycosis of toenail  07/24/2014   Hyperlipidemia associated with type 2 diabetes mellitus ([place]) 04/23/2014   Type II diabetes mellitus with complication ([place]) [id number]    Rosalyn Gess, OTR/L,CLT 01/05/2023, 9:00 AM  [facility] [address] [place], [address] Phone: [phone]   Fax:  [phone]  Name: Jesse Alvarado MRN: [id number] Date of Birth: 06-Aug-1966

## 2023-01-07 ENCOUNTER — Ambulatory Visit: Payer: Managed Care, Other (non HMO) | Admitting: Occupational Therapy

## 2023-01-07 DIAGNOSIS — M6281 Muscle weakness (generalized): Secondary | ICD-10-CM

## 2023-01-07 DIAGNOSIS — M25631 Stiffness of right wrist, not elsewhere classified: Secondary | ICD-10-CM

## 2023-01-07 DIAGNOSIS — L905 Scar conditions and fibrosis of skin: Secondary | ICD-10-CM

## 2023-01-07 DIAGNOSIS — M25531 Pain in right wrist: Secondary | ICD-10-CM

## 2023-01-07 NOTE — Therapy (Signed)
[facility] [address] [place], [address] Phone: [phone]   Fax:  [phone]  Occupational Therapy Treatment  Patient Details  Name: Jesse Alvarado MRN: [id number] Date of Birth: [birth year] Referring Provider (OT): [place] PA   Encounter Date: 01/07/2023   OT End of Session - 01/07/23 0818     Visit Number 13    Number of Visits 18    Date for OT Re-Evaluation 01/08/23    OT Start Time 0819    OT Stop Time 0858    OT Time Calculation (min) 39 min    Activity Tolerance Patient tolerated treatment well    Behavior During Therapy [facility] for tasks assessed/performed             Past Medical History:  Diagnosis Date   Anemia    Arthritis    Asthma    sports induced asthma. takes inhalers when needed   Barrett's esophagus without dysplasia    Cancer (HCC)    Basal and squamoous cell   Chronic kidney disease    Complication of anesthesia    high tolerance to pain medication. body absorbs pain med quickly   Coronary artery disease    Cough on exercise    Diabetes mellitus without complication ([place])    Diverticulosis    Erectile dysfunction    Fatty liver    GERD (gastroesophageal reflux disease)    History of kidney stones    Hyperlipidemia    Hypertension    per patient, he does not have high bp but is treated for his kidneys and his diabetes   Pancreatitis    Right shoulder injury    Sleep apnea    use C-PAP    Past Surgical History:  Procedure Laterality Date   ANKLE GANGLION CYST EXCISION Left    x 5   CARPAL TUNNEL RELEASE Right 10/03/2018   Procedure: CARPAL TUNNEL RELEASE;  Surgeon: Earnestine Leys, MD;  Location: [facility];  Service: Orthopedics;  Laterality: Right;   CARPAL TUNNEL RELEASE Left 10/24/2018   Procedure: CARPAL TUNNEL RELEASE;  Surgeon: Earnestine Leys, MD;  Location: [facility];  Service: Orthopedics;  Laterality: Left;   COLONOSCOPY  05/10/2014   COLONOSCOPY N/A 12/16/2022    Procedure: COLONOSCOPY;  Surgeon: Lesly Rubenstein, MD;  Location: [facility];  Service: Endoscopy;  Laterality: N/A;   COLONOSCOPY WITH PROPOFOL N/A 08/30/2019   Procedure: COLONOSCOPY WITH PROPOFOL;  Surgeon: Toledo, Benay Pike, MD;  Location: [facility];  Service: Gastroenterology;  Laterality: N/A;   CYSTOSCOPY WITH STENT PLACEMENT Bilateral 06/05/2016   Procedure: CYSTOSCOPY WITH STENT PLACEMENT;  Surgeon: Nickie Retort, MD;  Location: [facility];  Service: Urology;  Laterality: Bilateral;   CYSTOSCOPY/URETEROSCOPY/HOLMIUM LASER/STENT PLACEMENT Left 04/13/2016   Procedure: CYSTOSCOPY/RETROGRADE PYELOGRAM/URETEROSCOPY WITH HOLMIUM LASER LITHOTRIPSY//STENT PLACEMENT;  Surgeon: Festus Aloe, MD;  Location: [facility];  Service: Urology;  Laterality: Left;   ESOPHAGOGASTRODUODENOSCOPY N/A 12/16/2022   Procedure: ESOPHAGOGASTRODUODENOSCOPY (EGD);  Surgeon: Lesly Rubenstein, MD;  Location: [facility] ENDOSCOPY;  Service: Endoscopy;  Laterality: N/A;   ESOPHAGOGASTRODUODENOSCOPY (EGD) WITH PROPOFOL N/A 12/27/2015   Procedure: ESOPHAGOGASTRODUODENOSCOPY (EGD) WITH PROPOFOL;  Surgeon: Manya Silvas, MD;  Location: [facility] END[facility]COPY;  Service: Endoscopy;  Laterality: N/A;   ESOPHAGOGASTRODUODENOSCOPY (EGD) WITH PROPOFOL N/A 08/30/2019   Procedure: ESOPHAGOGASTRODUODENOSCOPY (EGD) WITH PROPOFOL;  Surgeon: Toledo, Benay Pike, MD;  Location: [facility];  Service: Gastroenterology;  Laterality: N/A;   EXTRACORPOREAL SHOCK WAVE LITHOTRIPSY Right 12/30/2017   Procedure: EXTRACORPOREAL  SHOCK WAVE LITHOTRIPSY (ESWL);  Surgeon: Royston Cowper, MD;  Location: [facility];  Service: Urology;  Laterality: Right;   EXTRACORPOREAL SHOCK WAVE LITHOTRIPSY Left 03/19/2022   Procedure: EXTRACORPOREAL SHOCK WAVE LITHOTRIPSY (ESWL);  Surgeon: Royston Cowper, MD;  Location: [facility];  Service: Urology;  Laterality: Left;   EXTRACORPOREAL SHOCK WAVE LITHOTRIPSY Right 08/20/2022   Procedure: EXTRACORPOREAL  SHOCK WAVE LITHOTRIPSY (ESWL);  Surgeon: Royston Cowper, MD;  Location: [facility];  Service: Urology;  Laterality: Right;   GANGLION CYST EXCISION Right 08/12/2022   Procedure: EXCISION OF DORSAL CARPAL GANGLION OF RIGHT WRIST;  Surgeon: Corky Mull, MD;  Location: [facility];  Service: Orthopedics;  Laterality: Right;   HERNIA REPAIR  [id number]   umbilical   KNEE ARTHROSCOPY Right 2015   had bursa sack repaired   KNEE ARTHROSCOPY WITH MEDIAL MENISECTOMY Right 10/11/2020   Procedure: Right knee arthroscopy partial medial meniscectomy;  Surgeon: Leim Fabry, MD;  Location: [place];  Service: Orthopedics;  Laterality: Right;  Diabetic - oral meds sleep apnea   KNEE ARTHROSCOPY WITH MENISCAL REPAIR Right 11/18/2018   Procedure: KNEE ARTHROSCOPY WITH MENISCAL REPAIR AND CHONDROPLASTY;  Surgeon: Leim Fabry, MD;  Location: [place];  Service: Orthopedics;  Laterality: Right;  Diabetic - oral meds sleep apnea SUPINE WITH ACL LEG HOLDER SMITH AND NEWPHEW CETERIX   REPAIR EXTENSOR TENDON Right 10/22/2022   Procedure: EXTENSOR TENOSYNOVECTOMY OF RIGHT WRIST;  Surgeon: Corky Mull, MD;  Location: [facility];  Service: Orthopedics;  Laterality: Right;   SHOULDER SURGERY Right 2011   tendon was shredded and was trimmed, repaired and reattached. Screws in shoulder   TOTAL HIP ARTHROPLASTY Left 10/28/2021   Procedure: TOTAL HIP ARTHROPLASTY;  Surgeon: Corky Mull, MD;  Location: [facility];  Service: Orthopedics;  Laterality: Left;   URETEROSCOPY WITH HOLMIUM LASER LITHOTRIPSY Bilateral 06/05/2016   Procedure: URETEROSCOPY WITH HOLMIUM LASER LITHOTRIPSY;  Surgeon: Nickie Retort, MD;  Location: [facility];  Service: Urology;  Laterality: Bilateral;   VASECTOMY  2002    There were no vitals filed for this visit.   Subjective Assessment - 01/07/23 0818     Subjective  I notice that when I reach out with my hand stretch out, my wrist do not stay straight- I have to focus keeping  it straight    Pertinent History 11/20/22 Ortho note : Jesse Alvarado is a 57 y.o. male who presents today for a repeat skin check status post a revision extensive tenosynovectomy involving the dorsal aspect of the right wrist. The patient is now 4 weeks status post surgery, surgery was performed by Dr. Roland Rack on 10/22/2022. At his initial postop visit the patient was instructed to continue to wear the Velcro wrist splint routinely except for showers and continue with the Ace wrap applied to the right wrist to minimize the amount of swelling along the dorsal aspect of the right wrist. The patient denies any trauma or injury affecting right wrist since his procedure. He denies any signs of infection at home such as fevers chills or any drainage from the dorsal aspect of the right wrist. The patient has not noticed any significant focal swelling like he did after his initial tenosynovectomy. The patient has been out of work since the surgery. He does still have swelling noted in his fingers but states that this is improving at this time. The patient reports a 2 out of 10 pain score in the right upper extremity at today's visit.  He is not taking any medication consistently for the right wrist at this time. REfer to OT    Patient Stated Goals Want my motion and strength back in my R wrist so I can go back to work    Currently in Pain? No/denies                          OT Treatments/Exercises (OP) - 01/07/23 0001       RUE Paraffin   Number Minutes Paraffin 8 Minutes    RUE Paraffin Location Wrist    Comments flexion, ext stretch 2 min heat pack            Done some soft tissue mobs to dorsal wrist - as well as some gentle traction and joint mobs to wrist - and light carpal rolls in weightbearing  Prior to wrist stretches  Focus on scar massage with wrist  and digits in extention -use coban to do scar mobs proximal<> distally - circular Done also opposite directions with flexion  and extention  Pt to only do at home circular and lateral scar mobs with wrist and digits rested in extention  Kinesiotape done 100 %pull in length of scar and across at 1st surgery scar 100% - with wrist and digits I extention place- to wear during day    Keep pain under 2/10     Great progress after paraffin and soft tissue again in session      Cont at home table slides for wrist extention pain free 20 reps  at home PROM flexion and extention - open hand and loose fist   Circular AROM in both direction -and with 1lbs weight 20 reps Upgrade to 3lbs for sup/pro and RD, UD  2 x 15 reps- making sure wrist stays straight And wrist ext 2 lbs holding end of weight with finger tips - - using MC extention and wrist extensors  12-15 reps pain free  Increase gradually sets in 2-4 days  Pain free       cont cica scar pad - to use night time under compression glove           OT Education - 01/07/23 0818     Education Details progress and changes  HEP    Person(s) Educated Patient    Methods Explanation;Demonstration;Tactile cues;Verbal cues;Handout    Comprehension Verbalized understanding;Returned demonstration;Verbal cues required              OT Short Term Goals - 11/27/22 1631       OT SHORT TERM GOAL #1   Title Patient to be independent in home program to decrease scar tissue and edema and increase wrist flexion extension.    Baseline No knowledge of home program.  Wrist extension 35, flat 25 to wrist.    Time 3    Period Weeks    Status New    Target Date 12/18/22               OT Long Term Goals - 11/27/22 1631       OT LONG TERM GOAL #1   Title Right wrist flexion extension increased to within functional limits for patient to push and pull heavy door, twist and turn knobs without increase symptoms    Baseline Right wrist extension 35 degrees, and 25 degrees.  Pain with functional use can increase to 3/10.  With sometimes shooting or sharp pain 7/10.     Time 6  Period Weeks    Status New    Target Date 01/08/23      OT LONG TERM GOAL #2   Title Strength in the right wrist increased to 5/5 for patient to return back to work without increase symptoms    Baseline Patient 5 weeks postop flexion 25, extension 35 degrees.  Pain 3-7/10.  No strengthening yet.    Time 6    Period Weeks    Status New    Target Date 01/08/23      OT LONG TERM GOAL #3   Title Right grip and prehension strength increased to more than 75% compared to the left with increased symptoms to return back to work.    Baseline Not tested patient 5 weeks postop limited flexion extension of the wrist to 25-35 degrees.  In 3- 7/10 at rest    Time 6    Period Weeks    Status New    Target Date 01/08/23                   Plan - 01/07/23 0819     Clinical Impression Statement Patient  post right dorsal wrist tenosynovectomy 10/22/22.  Patient had a cyst removed in October 23.  Patient present at eval with decreased right dominant wrist flexion/ extension, increased scar tissue increased pain with some edema in hand/ wrist.  Patient limited in functional use of right dominant hand in ADLs and IADLs as well as to return to work. Pt making great progress in edema and pain -as well as increase flexion , ext of wrist every  session.  Wrist extention 70 close fist and can do WB thru palm. Wrist flexion  80-85 degrees this week- pt complain more of stiffness than pain. Cont to  have some tenderness  where 2 scars cross with wrist extention and digits extention - and scar massage prox<>distal. Change strengthening to 3 lbs for sup/pro, RD, UD making sure wrist stay straight. Wrist ext 2 lbs with MC joints extended - 12 reps -15 reps pain free.  Pt doing well with strenghtening and pain free AROM.  Pt to cont using paraffin at home with stretches. Patient can benefit from skilled OT services to increase motion, increase scar tissue and pain increase strength to return back to prior  level of function.    OT Occupational Profile and History Problem Focused Assessment - Including review of records relating to presenting problem    Occupational performance deficits (Please refer to evaluation for details): ADL's;IADL's;Work;Social Participation;Leisure;Play    Body Structure / Function / Physical Skills ADL;Scar mobility;UE functional use;Flexibility;IADL;Pain;Strength;Edema;ROM    Rehab Potential Good    Clinical Decision Making Limited treatment options, no task modification necessary    Comorbidities Affecting Occupational Performance: None    Modification or Assistance to Complete Evaluation  No modification of tasks or assist necessary to complete eval    OT Frequency 2x / week    OT Duration 4 weeks    OT Treatment/Interventions Self-care/ADL training;Moist Heat;Paraffin;Fluidtherapy;Contrast Bath;Passive range of motion;Therapeutic activities;Splinting;Scar mobilization;Manual Therapy;Therapeutic exercise;Patient/family education    Consulted and Agree with Plan of Care Patient             Patient will benefit from skilled therapeutic intervention in order to improve the following deficits and impairments:   Body Structure / Function / Physical Skills: ADL, Scar mobility, UE functional use, Flexibility, IADL, Pain, Strength, Edema, ROM       Visit Diagnosis: Pain in right wrist  Scar  condition and fibrosis of skin  Muscle weakness (generalized)  Stiffness of right wrist, not elsewhere classified    Problem List Patient Active Problem List   Diagnosis Date Noted   Acute GI bleeding 12/15/2022   Status post total hip replacement, left 10/28/2021   Dyslipidemia 08/24/2021   Status post colonoscopy 06/02/2019   Hepatic steatosis 06/02/2019   Barrett's esophagus without dysplasia 01/19/2019   High coronary artery calcium score 12/29/2018   Chronic iron deficiency anemia 12/22/2017   Gastritis without bleeding 06/25/2017   Vitamin B12 deficiency  08/03/2016   OSA on CPAP 04/18/2016   Nephrolithiasis 04/13/2016   Chronic midline low back pain without sciatica 01/25/2016   Primary osteoarthritis involving multiple joints 11/20/2015   Bilateral carpal tunnel syndrome 09/14/2015   Essential hypertension 09/14/2015   Chronic knee pain 08/30/2014   Erectile dysfunction 08/30/2014   Other allergic rhinitis 08/30/2014   Onychomycosis of toenail 07/24/2014   Hyperlipidemia associated with type 2 diabetes mellitus ([place]) 04/23/2014   Type II diabetes mellitus with complication ([place]) [id number]    Rosalyn Gess, OTR/L,CLT 01/07/2023, 11:06 AM  [facility] [address], [place], [address] Phone: [phone]   Fax:  [phone]  Name: Jesse Alvarado MRN: [id number] Date of Birth: 09-09-1966

## 2023-01-11 ENCOUNTER — Ambulatory Visit: Payer: Managed Care, Other (non HMO) | Admitting: Occupational Therapy

## 2023-01-15 ENCOUNTER — Ambulatory Visit: Payer: Managed Care, Other (non HMO) | Attending: Student | Admitting: Occupational Therapy

## 2023-01-15 DIAGNOSIS — L905 Scar conditions and fibrosis of skin: Secondary | ICD-10-CM | POA: Insufficient documentation

## 2023-01-15 DIAGNOSIS — M25631 Stiffness of right wrist, not elsewhere classified: Secondary | ICD-10-CM | POA: Diagnosis present

## 2023-01-15 DIAGNOSIS — M25531 Pain in right wrist: Secondary | ICD-10-CM | POA: Insufficient documentation

## 2023-01-15 DIAGNOSIS — M6281 Muscle weakness (generalized): Secondary | ICD-10-CM | POA: Diagnosis present

## 2023-01-16 ENCOUNTER — Encounter: Payer: Self-pay | Admitting: Occupational Therapy

## 2023-01-16 NOTE — Therapy (Signed)
[facility] [address] [place], [address] Phone: [phone]   Fax:  [phone]  Occupational Therapy Treatment/Recertification  Patient Details  Name: Jesse Alvarado MRN: [id number] Date of Birth: 29-Jan-1966 Referring Provider (OT): McRae[place] PA   Encounter Date: 01/15/2023   OT End of Session - 01/16/23 2138     Visit Number 14    Number of Visits 18    Date for OT Re-Evaluation 03/12/23    OT Start Time 0830    OT Stop Time 0915    OT Time Calculation (min) 45 min    Activity Tolerance Patient tolerated treatment well    Behavior During Therapy [facility] for tasks assessed/performed             Past Medical History:  Diagnosis Date   Anemia    Arthritis    Asthma    sports induced asthma. takes inhalers when needed   Barrett's esophagus without dysplasia    Cancer (HCC)    Basal and squamoous cell   Chronic kidney disease    Complication of anesthesia    high tolerance to pain medication. body absorbs pain med quickly   Coronary artery disease    Cough on exercise    Diabetes mellitus without complication ([place])    Diverticulosis    Erectile dysfunction    Fatty liver    GERD (gastroesophageal reflux disease)    History of kidney stones    Hyperlipidemia    Hypertension    per patient, he does not have high bp but is treated for his kidneys and his diabetes   Pancreatitis    Right shoulder injury    Sleep apnea    use C-PAP    Past Surgical History:  Procedure Laterality Date   ANKLE GANGLION CYST EXCISION Left    x 5   CARPAL TUNNEL RELEASE Right 10/03/2018   Procedure: CARPAL TUNNEL RELEASE;  Surgeon: Earnestine Leys, MD;  Location: [facility];  Service: Orthopedics;  Laterality: Right;   CARPAL TUNNEL RELEASE Left 10/24/2018   Procedure: CARPAL TUNNEL RELEASE;  Surgeon: Earnestine Leys, MD;  Location: [facility];  Service: Orthopedics;  Laterality: Left;   COLONOSCOPY  05/10/2014    COLONOSCOPY N/A 12/16/2022   Procedure: COLONOSCOPY;  Surgeon: Lesly Rubenstein, MD;  Location: [facility] ENDOSCOPY;  Service: Endoscopy;  Laterality: N/A;   COLONOSCOPY WITH PROPOFOL N/A 08/30/2019   Procedure: COLONOSCOPY WITH PROPOFOL;  Surgeon: Toledo, Benay Pike, MD;  Location: [facility] ENDOSCOPY;  Service: Gastroenterology;  Laterality: N/A;   CYSTOSCOPY WITH STENT PLACEMENT Bilateral 06/05/2016   Procedure: CYSTOSCOPY WITH STENT PLACEMENT;  Surgeon: Nickie Retort, MD;  Location: [facility];  Service: Urology;  Laterality: Bilateral;   CYSTOSCOPY/URETEROSCOPY/HOLMIUM LASER/STENT PLACEMENT Left 04/13/2016   Procedure: CYSTOSCOPY/RETROGRADE PYELOGRAM/URETEROSCOPY WITH HOLMIUM LASER LITHOTRIPSY//STENT PLACEMENT;  Surgeon: Festus Aloe, MD;  Location: [facility];  Service: Urology;  Laterality: Left;   ESOPHAGOGASTRODUODENOSCOPY N/A 12/16/2022   Procedure: ESOPHAGOGASTRODUODENOSCOPY (EGD);  Surgeon: Lesly Rubenstein, MD;  Location: [facility] ENDOSCOPY;  Service: Endoscopy;  Laterality: N/A;   ESOPHAGOGASTRODUODENOSCOPY (EGD) WITH PROPOFOL N/A 12/27/2015   Procedure: ESOPHAGOGASTRODUODENOSCOPY (EGD) WITH PROPOFOL;  Surgeon: Manya Silvas, MD;  Location: [facility]COPY;  Service: Endoscopy;  Laterality: N/A;   ESOPHAGOGASTRODUODENOSCOPY (EGD) WITH PROPOFOL N/A 08/30/2019   Procedure: ESOPHAGOGASTRODUODENOSCOPY (EGD) WITH PROPOFOL;  Surgeon: Toledo, Benay Pike, MD;  Location: [facility] ENDOSCOPY;  Service: Gastroenterology;  Laterality: N/A;   EXTRACORPOREAL SHOCK WAVE LITHOTRIPSY Right 12/30/2017   Procedure: EXTRACORPOREAL  SHOCK WAVE LITHOTRIPSY (ESWL);  Surgeon: Royston Cowper, MD;  Location: [facility];  Service: Urology;  Laterality: Right;   EXTRACORPOREAL SHOCK WAVE LITHOTRIPSY Left 03/19/2022   Procedure: EXTRACORPOREAL SHOCK WAVE LITHOTRIPSY (ESWL);  Surgeon: Royston Cowper, MD;  Location: [facility];  Service: Urology;  Laterality: Left;   EXTRACORPOREAL SHOCK WAVE LITHOTRIPSY Right 08/20/2022    Procedure: EXTRACORPOREAL SHOCK WAVE LITHOTRIPSY (ESWL);  Surgeon: Royston Cowper, MD;  Location: [facility];  Service: Urology;  Laterality: Right;   GANGLION CYST EXCISION Right 08/12/2022   Procedure: EXCISION OF DORSAL CARPAL GANGLION OF RIGHT WRIST;  Surgeon: Corky Mull, MD;  Location: [facility];  Service: Orthopedics;  Laterality: Right;   HERNIA REPAIR  [id number]   umbilical   KNEE ARTHROSCOPY Right 2015   had bursa sack repaired   KNEE ARTHROSCOPY WITH MEDIAL MENISECTOMY Right 10/11/2020   Procedure: Right knee arthroscopy partial medial meniscectomy;  Surgeon: Leim Fabry, MD;  Location: [place];  Service: Orthopedics;  Laterality: Right;  Diabetic - oral meds sleep apnea   KNEE ARTHROSCOPY WITH MENISCAL REPAIR Right 11/18/2018   Procedure: KNEE ARTHROSCOPY WITH MENISCAL REPAIR AND CHONDROPLASTY;  Surgeon: Leim Fabry, MD;  Location: [place];  Service: Orthopedics;  Laterality: Right;  Diabetic - oral meds sleep apnea SUPINE WITH ACL LEG HOLDER SMITH AND NEWPHEW CETERIX   REPAIR EXTENSOR TENDON Right 10/22/2022   Procedure: EXTENSOR TENOSYNOVECTOMY OF RIGHT WRIST;  Surgeon: Corky Mull, MD;  Location: [facility];  Service: Orthopedics;  Laterality: Right;   SHOULDER SURGERY Right 2011   tendon was shredded and was trimmed, repaired and reattached. Screws in shoulder   TOTAL HIP ARTHROPLASTY Left 10/28/2021   Procedure: TOTAL HIP ARTHROPLASTY;  Surgeon: Corky Mull, MD;  Location: [facility];  Service: Orthopedics;  Laterality: Left;   URETEROSCOPY WITH HOLMIUM LASER LITHOTRIPSY Bilateral 06/05/2016   Procedure: URETEROSCOPY WITH HOLMIUM LASER LITHOTRIPSY;  Surgeon: Nickie Retort, MD;  Location: [facility];  Service: Urology;  Laterality: Bilateral;   VASECTOMY  2002    There were no vitals filed for this visit.   Subjective Assessment - 01/16/23 2136     Subjective  Pt reports he has been doing well, brought weight back he has been utilizing.  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  Reports some hand fatigue but not necessarily pain.    Pertinent History 11/20/22 Ortho note : Jesse Alvarado is a 57 y.o. male who presents today for a repeat skin check status post a revision extensive tenosynovectomy involving the dorsal aspect of the right wrist. The patient is now 4 weeks status post surgery, surgery was performed by Dr. Roland Rack on 10/22/2022. At his initial postop visit the patient was instructed to continue to wear the Velcro wrist splint routinely except for showers and continue with the Ace wrap applied to the right wrist to minimize the amount of swelling along the dorsal aspect of the right wrist. The patient denies any trauma or injury affecting right wrist since his procedure. He denies any signs of infection at home such as fevers chills or any drainage from the dorsal aspect of the right wrist. The patient has not noticed any significant focal swelling like he did after his initial tenosynovectomy. The patient has been out of work since the surgery. He does still have swelling noted in his fingers but states that this is improving at this time.  The patient reports a 2 out of 10 pain score in the right upper extremity at today's visit. He is not taking any medication consistently for the right wrist at this time. REfer to OT    Patient Stated Goals Want my motion and strength back in my R wrist so I can go back to work    Currently in Pain? No/denies    Pain Score 0-No pain             Paraffin: Paraffin to right wrist and hand for 8 mins.  Wrist placed in alternating flexion/extension for 2 min intervals with added heat to wrist and hand.   Manual therapy: Following paraffin, therapist performed soft tissue mobs to dorsal wrist - as well as some gentle traction and joint mobs to wrist - and light carpal rolls in weightbearing.  Continued focus on scar massage with wrist and  digits in extension.  Performed in opposite directions with flexion and extension.  Pt to continue with circular and lateral scar mobs with wrist and digits rested in extension  Issued additional strips of kinesiotape to use at home as a part of his home program with 100% stretch   Keep pain under 2/10    Table slides for wrist extension pain free 20 reps performed in clinic and continue with part of HEP PROM flexion and extension - open hand and loose fist   Circular AROM in both direction -and with 1lbs weight 20 reps 3lbs for sup/pro and RD, UD  2 x 15 reps- making sure wrist stays straight wrist ext 2 lbs holding end of weight with finger tips - - using MC extension and wrist extensors, 3 sets performed of 12 reps.    cont cica scar pad - to use night time under compression glove  Issued cica care this date since pt reported current one lack stickiness   [facility] OT Assessment - 01/16/23 2200       AROM   Right Wrist Extension 58 Degrees    Right Wrist Flexion 80 Degrees    Right Wrist Radial Deviation 22 Degrees    Right Wrist Ulnar Deviation 30 Degrees      Strength   Right Hand Grip (lbs) 87               OT Education - 01/16/23 2138     Education Details progress and changes  HEP    Person(s) Educated Patient    Methods Explanation;Demonstration;Tactile cues;Verbal cues;Handout    Comprehension Verbalized understanding;Returned demonstration;Verbal cues required              OT Short Term Goals - 01/16/23 2205       OT SHORT TERM GOAL #1   Title Patient to be independent in home program to decrease scar tissue and edema and increase wrist flexion extension.    Baseline Pt wrist extension improved to, HEP continues to change with improvements    Time 3    Period Weeks    Status On-going    Target Date 03/12/23               OT Long Term Goals - 01/16/23 2208       OT LONG TERM GOAL #1   Title Right wrist flexion extension increased to within  functional limits for patient to push and pull heavy door, twist and turn knobs without increase symptoms    Baseline Right wrist extension 58 degrees.  Pain with functional use can increase to  3/10.    Time 6    Period Weeks    Status On-going    Target Date 03/12/23      OT LONG TERM GOAL #2   Title Strength in the right wrist increased to 5/5 for patient to return back to work without increase symptoms    Baseline wrist flex to 80, ext to 58, using 2# weight for strengthening    Time 6    Period Weeks    Status On-going    Target Date 03/12/23      OT LONG TERM GOAL #3   Title Right grip and prehension strength increased to more than 75% compared to the left with increased symptoms to return back to work.    Baseline Grip right 87#    Time 6    Period Weeks    Status Achieved    Target Date 03/12/23             Plan - 01/16/23 2150     Clinical Impression Statement Patient  post right dorsal wrist tenosynovectomy 10/22/22.  Patient had a cyst removed in October 23.  Patient present at eval with decreased right dominant wrist flexion/ extension, increased scar tissue increased pain with some edema in hand/ wrist.  Patient limited in functional use of right dominant hand in ADLs and IADLs as well as to return to work. Pt making great progress in edema and pain -as well as increase flexion , ext of wrist every  session.  Wrist extension 70 close fist and can do WB thru palm. Wrist flexion  80-85 degrees this week- pt complain more of stiffness than pain. Cont to  have some tenderness  where 2 scars cross with wrist extension and digits extension - and scar massage prox<>distal. Strengthening to 3 lbs for sup/pro, RD, UD making sure wrist stays neutral. Wrist ext 2 lbs with MC joints extended - 12 reps -15 reps pain free.  Pt doing well with strenghtening and pain free AROM.  Pt to cont using paraffin at home with stretches.  Pt planning to return to work tasks in the next week, will be  seen for  2-3 more follow up sessions to monitor and modify tasks as needed.  Patient can benefit from skilled OT services to increase motion, increase scar tissue and pain inc-rease strength to return back to prior level of function.    OT Occupational Profile and History Problem Focused Assessment - Including review of records relating to presenting problem    Occupational performance deficits (Please refer to evaluation for details): ADL's;IADL's;Work;Social Participation;Leisure;Play    Body Structure / Function / Physical Skills ADL;Scar mobility;UE functional use;Flexibility;IADL;Pain;Strength;Edema;ROM    Rehab Potential Good    Clinical Decision Making Limited treatment options, no task modification necessary    Comorbidities Affecting Occupational Performance: None    Modification or Assistance to Complete Evaluation  No modification of tasks or assist necessary to complete eval    OT Frequency Other (comment)   4 visits   OT Duration 8 weeks    OT Treatment/Interventions Self-care/ADL training;Moist Heat;Paraffin;Fluidtherapy;Contrast Bath;Passive range of motion;Therapeutic activities;Splinting;Scar mobilization;Manual Therapy;Therapeutic exercise;Patient/family education    Consulted and Agree with Plan of Care Patient              Patient will benefit from skilled therapeutic intervention in order to improve the following deficits and impairments:   Body Structure / Function / Physical Skills: ADL, Scar mobility, UE functional use, Flexibility, IADL, Pain, Strength, Edema, ROM Visit  Diagnosis: Pain in right wrist - Plan: Ot plan of care cert/re-cert  Scar condition and fibrosis of skin - Plan: Ot plan of care cert/re-cert  Muscle weakness (generalized) - Plan: Ot plan of care cert/re-cert  Stiffness of right wrist, not elsewhere classified - Plan: Ot plan of care cert/re-cert    Problem List Patient Active Problem List   Diagnosis Date Noted   Acute GI bleeding  12/15/2022   Status post total hip replacement, left 10/28/2021   Dyslipidemia 08/24/2021   Status post colonoscopy 06/02/2019   Hepatic steatosis 06/02/2019   Barrett's esophagus without dysplasia 01/19/2019   High coronary artery calcium score 12/29/2018   Chronic iron deficiency anemia 12/22/2017   Gastritis without bleeding 06/25/2017   Vitamin B12 deficiency 08/03/2016   OSA on CPAP 04/18/2016   Nephrolithiasis 04/13/2016   Chronic midline low back pain without sciatica 01/25/2016   Primary osteoarthritis involving multiple joints 11/20/2015   Bilateral carpal tunnel syndrome 09/14/2015   Essential hypertension 09/14/2015   Chronic knee pain 08/30/2014   Erectile dysfunction 08/30/2014   Other allergic rhinitis 08/30/2014   Onychomycosis of toenail 07/24/2014   Hyperlipidemia associated with type 2 diabetes mellitus ([place]) 04/23/2014   Type II diabetes mellitus with complication ([place]) [id number]   Gricelda Foland T Janaria Mccammon, OTR/L, CLT  Herberto Ledwell, OT 01/16/2023, 10:20 PM  [facility] [address] [place], [address] Phone: [phone]   Fax:  [phone]  Name: Jesse Alvarado MRN: [id number] Date of Birth: June 23, 1966

## 2023-01-28 ENCOUNTER — Ambulatory Visit: Payer: Managed Care, Other (non HMO) | Admitting: Occupational Therapy

## 2023-02-02 ENCOUNTER — Encounter: Payer: Self-pay | Admitting: Occupational Therapy

## 2023-02-23 ENCOUNTER — Other Ambulatory Visit: Payer: Self-pay | Admitting: *Deleted

## 2023-02-23 DIAGNOSIS — E781 Pure hyperglyceridemia: Secondary | ICD-10-CM

## 2023-02-23 MED ORDER — ICOSAPENT ETHYL 1 G PO CAPS: 2.0000 g | ORAL_CAPSULE | Freq: Two times a day (BID) | ORAL | 3 refills | Status: DC

## 2023-08-31 ENCOUNTER — Other Ambulatory Visit: Payer: Self-pay

## 2023-08-31 ENCOUNTER — Other Ambulatory Visit (HOSPITAL_BASED_OUTPATIENT_CLINIC_OR_DEPARTMENT_OTHER): Payer: Self-pay | Admitting: Cardiology

## 2023-08-31 DIAGNOSIS — E781 Pure hyperglyceridemia: Secondary | ICD-10-CM

## 2023-08-31 MED ORDER — ATORVASTATIN CALCIUM 80 MG PO TABS: 80.0000 mg | ORAL_TABLET | Freq: Every day | ORAL | 0 refills | Status: DC

## 2023-11-16 ENCOUNTER — Other Ambulatory Visit: Payer: Self-pay | Admitting: Internal Medicine

## 2023-12-17 ENCOUNTER — Other Ambulatory Visit: Payer: Self-pay

## 2023-12-17 DIAGNOSIS — E781 Pure hyperglyceridemia: Secondary | ICD-10-CM

## 2023-12-17 MED ORDER — ATORVASTATIN CALCIUM 80 MG PO TABS: 80.0000 mg | ORAL_TABLET | Freq: Every day | ORAL | 0 refills | Status: DC

## 2024-01-23 DIAGNOSIS — R002 Palpitations: Secondary | ICD-10-CM | POA: Insufficient documentation

## 2024-01-23 NOTE — Progress Notes (Unsigned)
  Cardiology Office Note:   Date:  01/25/2024  ID:  Jesse Alvarado, DOB [birth year], MRN [id number] PCP: Enid Baas, MD   HeartCare Providers Cardiologist:  Rollene Rotunda, MD {  History of Present Illness:   Jesse Alvarado is a 58 y.o. male  who presents for evaluation of coronary calcium.  This was found on a CT. I saw him for evaluation of shortness of breath and cough.  He had a negative perfusion study ultimately had a negative cardiopulmonary stress test.     He presents for follow-up.  He has done okay since I saw him.  He has blood sugars creeping up a little bit but he works with an endocrinologist.  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 The patient denies any new symptoms such as chest discomfort, neck or arm discomfort. There has been no new shortness of breath, PND or orthopnea. There have been no reported palpitations, presyncope or syncope.   ROS: As stated in the HPI and negative for all other systems.  Studies Reviewed:    EKG:   EKG Interpretation Date/Time:  Tuesday January 25 2024 15:21:55 EDT Ventricular Rate:  97 PR Interval:  150 QRS Duration:  86 QT Interval:  352 QTC Calculation: 447 R Axis:   71  Text Interpretation: Normal sinus rhythm Normal ECG When compared with ECG of 12-Dec-2022 12:53, No significant change was found Confirmed by Rollene Rotunda ([id number]) on 01/25/2024 4:02:58 PM    Risk Assessment/Calculations:          Physical Exam:   VS:  BP 104/60 (BP Location: Left Arm, Patient Position: Sitting, Cuff Size: Normal)   Pulse 100   Ht 5\' 10"  (1.778 m)   Wt 179 lb 6.4 oz (81.4 kg)   SpO2 97%   BMI 25.74 kg/m    Wt Readings from Last 3 Encounters:  01/25/24 179 lb 6.4 oz (81.4 kg)  12/15/22 179 lb (81.2 kg)  10/22/22 184 lb (83.5 kg)     GEN: Well nourished, well developed in no acute distress NECK: No JVD; No carotid bruits CARDIAC: RRR, no murmurs, rubs, gallops RESPIRATORY:  Clear to auscultation without rales,  wheezing or rhonchi  ABDOMEN: Soft, non-tender, non-distended EXTREMITIES:  No edema; No deformity   ASSESSMENT AND PLAN:   CORONARY CALCIUM:   He had a POET (Plain Old Exercise Treadmill) without high risk findings.  He has no new symptoms.  He will continue with primary risk reduction.  Of note he is not taking aspirin or he had some bleeding issues and was taken off of this.   DYSLIPIDEMIA:   LDL was 70.  I would like the goal to be in the 50s and he is going to get this checked in a couple of days.  I have also given him instructions to ask for an LP(a).  For right now we will continue the meds as listed but I would have a low threshold to change him to Zetia.    DM:    A1c was 7.3 which is up slightly.  He is having is followed actively by his primary provider and endocrinology.    Follow up with me in 2 years.   Signed, Rollene Rotunda, MD

## 2024-01-25 ENCOUNTER — Encounter: Payer: Self-pay | Admitting: Cardiology

## 2024-01-25 ENCOUNTER — Ambulatory Visit: Payer: Managed Care, Other (non HMO) | Attending: Cardiology | Admitting: Cardiology

## 2024-01-25 VITALS — BP 104/60 | HR 100 | Ht 70.0 in | Wt 179.4 lb

## 2024-01-25 DIAGNOSIS — E785 Hyperlipidemia, unspecified: Secondary | ICD-10-CM

## 2024-01-25 DIAGNOSIS — E118 Type 2 diabetes mellitus with unspecified complications: Secondary | ICD-10-CM | POA: Diagnosis not present

## 2024-01-25 DIAGNOSIS — R931 Abnormal findings on diagnostic imaging of heart and coronary circulation: Secondary | ICD-10-CM

## 2024-01-25 DIAGNOSIS — R002 Palpitations: Secondary | ICD-10-CM | POA: Diagnosis not present

## 2024-01-25 NOTE — Patient Instructions (Signed)
 Medication Instructions:  No changes.  *If you need a refill on your cardiac medications before your next appointment, please call your pharmacy*   Lab Work: Please ask you PCP to add the following labs to his blood work orders with next lab visit.  Need Fasting lipid and Lpa. If you have labs (blood work) drawn today and your tests are completely normal, you will receive your results only by: MyChart Message (if you have MyChart) OR A paper copy in the mail If you have any lab test that is abnormal or we need to change your treatment, we will call you to review the results.   Follow-Up: At [facility], you and your health needs are our priority.  As part of our continuing mission to provide you with exceptional heart care, we have created designated Provider Care Teams.  These Care Teams include your primary Cardiologist (physician) and Advanced Practice Providers (APPs -  Physician Assistants and Nurse Practitioners) who all work together to provide you with the care you need, when you need it.  We recommend signing up for the patient portal called "MyChart".  Sign up information is provided on this After Visit Summary.  MyChart is used to connect with patients for Virtual Visits (Telemedicine).  Patients are able to view lab/test results, encounter notes, upcoming appointments, etc.  Non-urgent messages can be sent to your provider as well.   To learn more about what you can do with MyChart, go to [email or web address].    Your next appointment:   2 year(s)  Provider:   Rollene Rotunda, MD     Other Instructions

## 2024-03-08 ENCOUNTER — Other Ambulatory Visit: Payer: Self-pay

## 2024-03-08 DIAGNOSIS — E781 Pure hyperglyceridemia: Secondary | ICD-10-CM

## 2024-03-08 MED ORDER — ICOSAPENT ETHYL 1 G PO CAPS: 2.0000 g | ORAL_CAPSULE | Freq: Two times a day (BID) | ORAL | 3 refills | Status: AC

## 2024-04-05 ENCOUNTER — Other Ambulatory Visit: Payer: Self-pay | Admitting: Cardiology

## 2024-04-05 DIAGNOSIS — E781 Pure hyperglyceridemia: Secondary | ICD-10-CM

## 2024-04-10 ENCOUNTER — Emergency Department

## 2024-04-10 ENCOUNTER — Emergency Department
Admission: EM | Admit: 2024-04-10 | Discharge: 2024-04-10 | Disposition: A | Discharge status: Home / Self Care | Attending: Emergency Medicine | Admitting: Emergency Medicine

## 2024-04-10 ENCOUNTER — Other Ambulatory Visit: Payer: Self-pay

## 2024-04-10 DIAGNOSIS — R1013 Epigastric pain: Secondary | ICD-10-CM | POA: Diagnosis present

## 2024-04-10 DIAGNOSIS — R519 Headache, unspecified: Secondary | ICD-10-CM | POA: Diagnosis not present

## 2024-04-10 DIAGNOSIS — K529 Noninfective gastroenteritis and colitis, unspecified: Secondary | ICD-10-CM | POA: Diagnosis not present

## 2024-04-10 LAB — CBC
HCT: 39 % (ref 39.0–52.0)
Hemoglobin: 12.6 g/dL — ABNORMAL LOW (ref 13.0–17.0)
MCH: 29.4 pg (ref 26.0–34.0)
MCHC: 32.3 g/dL (ref 30.0–36.0)
MCV: 90.9 fL (ref 80.0–100.0)
Platelets: 329 10*3/uL (ref 150–400)
RBC: 4.29 MIL/uL (ref 4.22–5.81)
RDW: 15.3 % (ref 11.5–15.5)
WBC: 14.4 10*3/uL — ABNORMAL HIGH (ref 4.0–10.5)
nRBC: 0 % (ref 0.0–0.2)

## 2024-04-10 LAB — COMPREHENSIVE METABOLIC PANEL WITH GFR
ALT: 20 U/L (ref 0–44)
AST: 25 U/L (ref 15–41)
Albumin: 4 g/dL (ref 3.5–5.0)
Alkaline Phosphatase: 29 U/L — ABNORMAL LOW (ref 38–126)
Anion gap: 11 (ref 5–15)
BUN: 34 mg/dL — ABNORMAL HIGH (ref 6–20)
CO2: 24 mmol/L (ref 22–32)
Calcium: 8.8 mg/dL — ABNORMAL LOW (ref 8.9–10.3)
Chloride: 105 mmol/L (ref 98–111)
Creatinine, Ser: 1.63 mg/dL — ABNORMAL HIGH (ref 0.61–1.24)
GFR, Estimated: 48 mL/min — ABNORMAL LOW (ref >60)
Glucose, Bld: 167 mg/dL — ABNORMAL HIGH (ref 70–99)
Potassium: 4.2 mmol/L (ref 3.5–5.1)
Sodium: 140 mmol/L (ref 135–145)
Total Bilirubin: 1.1 mg/dL (ref 0.0–1.2)
Total Protein: 6.9 g/dL (ref 6.5–8.1)

## 2024-04-10 LAB — LIPASE, BLOOD: Lipase: 50 U/L (ref 11–51)

## 2024-04-10 MED ORDER — BUTALBITAL-APAP-CAFFEINE 50-325-40 MG PO TABS: 1.0000 | ORAL_TABLET | Freq: Four times a day (QID) | ORAL | 0 refills | Status: AC | PRN

## 2024-04-10 MED ORDER — MORPHINE SULFATE (PF) 4 MG/ML IV SOLN
4.0000 mg | Freq: Once | INTRAVENOUS | Status: AC
  Administered 2024-04-10: 4 mg via INTRAVENOUS
  Filled 2024-04-10: qty 1

## 2024-04-10 MED ORDER — IOHEXOL 300 MG/ML  SOLN
100.0000 mL | Freq: Once | INTRAMUSCULAR | Status: AC | PRN
  Administered 2024-04-10: 100 mL via INTRAVENOUS

## 2024-04-10 MED ORDER — ONDANSETRON HCL 4 MG/2ML IJ SOLN
4.0000 mg | Freq: Once | INTRAMUSCULAR | Status: AC
  Administered 2024-04-10: 4 mg via INTRAVENOUS
  Filled 2024-04-10: qty 2

## 2024-04-10 MED ORDER — SODIUM CHLORIDE 0.9 % IV BOLUS
1000.0000 mL | Freq: Once | INTRAVENOUS | Status: AC
  Administered 2024-04-10: 1000 mL via INTRAVENOUS

## 2024-04-10 MED ORDER — DIPHENHYDRAMINE HCL 50 MG/ML IJ SOLN
25.0000 mg | Freq: Once | INTRAMUSCULAR | Status: AC
  Administered 2024-04-10: 25 mg via INTRAVENOUS
  Filled 2024-04-10: qty 1

## 2024-04-10 MED ORDER — PROCHLORPERAZINE EDISYLATE 10 MG/2ML IJ SOLN
5.0000 mg | Freq: Once | INTRAMUSCULAR | Status: AC
  Administered 2024-04-10: 5 mg via INTRAVENOUS
  Filled 2024-04-10: qty 2

## 2024-04-10 MED ORDER — KETOROLAC TROMETHAMINE 15 MG/ML IJ SOLN
15.0000 mg | Freq: Once | INTRAMUSCULAR | Status: AC
  Administered 2024-04-10: 15 mg via INTRAVENOUS
  Filled 2024-04-10: qty 1

## 2024-04-10 NOTE — Discharge Instructions (Signed)
 I have sent as needed headache medication to your pharmacy.  Please follow-up with your primary care provider for reassessment.  You can continue using Zofran  at home as well as hydration.  Please see your primary care doctor to recheck your kidney function in the upcoming days to ensure resolution.

## 2024-04-10 NOTE — ED Triage Notes (Signed)
 Pt comes with epigastric pain. Pt states this pain started today on way home. Pt states headache this morning. Pt had to pull over and vomit on side of road.   Pt states some arm and hand pain. Pt states no cp. Pt stats hx of pancreatitis.

## 2024-04-10 NOTE — ED Notes (Signed)
 EKG given to Dr.Wells.

## 2024-04-10 NOTE — ED Provider Notes (Signed)
 [facility] Provider Note    Event Date/Time   First MD Initiated Contact with Patient 04/10/24 1725     (approximate)   History   Abdominal Pain   HPI Jesse Alvarado is a 58 y.o. male presenting today for abdominal pain.  Patient states prior history of pancreatitis and started having epigastric abdominal pain associate with nausea and vomiting today.  Denies any diarrhea or constipation.  Will having ongoing mild nausea as well as pain in his upper abdomen.  Separately is also having a right-sided headache and noted some intermittent paresthesias and tingling in his left hand.  No numbness or weakness noted anywhere else.  No speech or vision changes.     Physical Exam   Triage Vital Signs: ED Triage Vitals  Encounter Vitals Group     BP 04/10/24 1709 134/84     Systolic BP Percentile --      Diastolic BP Percentile --      Pulse Rate 04/10/24 1709 (!) 140     Resp 04/10/24 1709 19     Temp 04/10/24 1709 98.6 F (37 C)     Temp Source 04/10/24 1709 Oral     SpO2 04/10/24 1709 99 %     Weight 04/10/24 1713 180 lb (81.6 kg)     Height 04/10/24 1713 5\' 9"  (1.753 m)     Head Circumference --      Peak Flow --      Pain Score 04/10/24 1713 8     Pain Loc --      Pain Education --      Exclude from Growth Chart --     Most recent vital signs: Vitals:   04/10/24 2100 04/10/24 2154  BP:  100/67  Pulse: 99   Resp: 14   Temp:    SpO2: 92%    Physical Exam: I have reviewed the vital signs and nursing notes. General: Awake, alert, no acute distress.  Nontoxic appearing. Head:  Atraumatic, normocephalic.   ENT:  EOM intact, PERRL. Oral mucosa is pink and moist with no lesions. Neck: Neck is supple with full range of motion, No meningeal signs. Cardiovascular:  RRR, No murmurs. Peripheral pulses palpable and equal bilaterally. Respiratory:  Symmetrical chest wall expansion.  No rhonchi, rales, or wheezes.  Good air movement throughout.  No use  of accessory muscles.   Musculoskeletal:  No cyanosis or edema. Moving extremities with full ROM Abdomen:  Soft, tenderness palpation in the epigastric region, nondistended. Neuro:  GCS 15, moving all four extremities, interacting appropriately. Speech clear.  Noted slight decrease in sensation to left hand in comparison to right.  No other acute numbness, weakness, paresthesias noted throughout his extremities.  Cranial nerves II through XII otherwise intact. Psych:  Calm, appropriate.   Skin:  Warm, dry, no rash.    ED Results / Procedures / Treatments   Labs (all labs ordered are listed, but only abnormal results are displayed) Labs Reviewed  COMPREHENSIVE METABOLIC PANEL WITH GFR - Abnormal; Notable for the following components:      Result Value   Glucose, Bld 167 (*)    BUN 34 (*)    Creatinine, Ser 1.63 (*)    Calcium  8.8 (*)    Alkaline Phosphatase 29 (*)    GFR, Estimated 48 (*)    All other components within normal limits  CBC - Abnormal; Notable for the following components:   WBC 14.4 (*)    Hemoglobin 12.6 (*)  All other components within normal limits  LIPASE, BLOOD  URINALYSIS, ROUTINE W REFLEX MICROSCOPIC     EKG My EKG interpretation: Rate of 137, sinus tachycardia, normal axis, normal intervals.  No acute ST elevations or depressions   RADIOLOGY Independently interpreted CT head with no acute pathology.  Independently interpreted CT abdomen/pelvis with evidence of enteritis   PROCEDURES:  Critical Care performed: No  Procedures   MEDICATIONS ORDERED IN ED: Medications  ondansetron  (ZOFRAN ) injection 4 mg (4 mg Intravenous Given 04/10/24 1822)  morphine  (PF) 4 MG/ML injection 4 mg (4 mg Intravenous Given 04/10/24 1822)  sodium chloride  0.9 % bolus 1,000 mL (0 mLs Intravenous Stopped 04/10/24 1941)  iohexol  (OMNIPAQUE ) 300 MG/ML solution 100 mL (100 mLs Intravenous Contrast Given 04/10/24 1923)  ketorolac  (TORADOL ) 15 MG/ML injection 15 mg (15 mg  Intravenous Given 04/10/24 2048)  sodium chloride  0.9 % bolus 1,000 mL (0 mLs Intravenous Stopped 04/10/24 2153)  prochlorperazine (COMPAZINE) injection 5 mg (5 mg Intravenous Given 04/10/24 2048)  diphenhydrAMINE  (BENADRYL ) injection 25 mg (25 mg Intravenous Given 04/10/24 2049)     IMPRESSION / MDM / ASSESSMENT AND PLAN / ED COURSE  I reviewed the triage vital signs and the nursing notes.                              Differential diagnosis includes, but is not limited to, pancreatitis, SBO, gastritis, esophagitis, dehydration, enteritis, colitis  Patient's presentation is most consistent with acute complicated illness / injury requiring diagnostic workup.  Patient is a 58 year old male presenting today for 2 complaints.  1 is epigastric abdominal pain associated with nausea and vomiting with history of pancreatitis.  Will evaluate for the same today as well as get CT imaging to rule out other acute intra-abdominal pathology such as SBO.  His CBC shows slight leukocytosis.  CMP with slight AKI.  Lipase is normal with lower suspicion for pancreatitis.  CT abdomen/pelvis shows enteritis but no other acute intra-abdominal pathology.  He had improvement in vital signs and was given a total of 2 L of fluid.  Separately, he is having a right-sided headache with intermittent tingling to his left hand.  Will get CT head for evaluation.  CT head negative.  On rediscussion with patient's, he seems to have recurrent headaches that often have prodromal symptoms including blurry vision and tingling.  Symptoms all resolved when headache is complete.  They do not occur outside of the headaches.  This sounds most consistent with migraines at this time.  We did discuss additional imaging such as MRI although given lack of neurological symptoms at this time and then coinciding with a headache, very low suspicion for CVA.  Patient is agreeable holding off on this at this time and will follow-up outpatient with neurology for  ongoing care.  Patient instructed to follow-up with PCP within the week for recheck of his creatinine function and encouraged hydration.  Sent home with Fioricet as needed to help with headaches.  The patient is on the cardiac monitor to evaluate for evidence of arrhythmia and/or significant heart rate changes. Clinical Course as of 04/10/24 2205  Mon Apr 10, 2024  2028 Rediscussed patient's symptoms with him.  He states he is having intermittent headaches that he calls sinus headaches.  When he has them he has some mild vision changes in his right eye when it first comes on as well as some tingling in his hands.  Has never  been formally diagnosed with migraines.  Outside of the headache at this time has no other acute neurological symptoms. [DW]  2029 We discussed MRI, however it seems very indicative of migraines and no indication for further imaging at this time. [DW]  2139 Patient with improvement in symptoms.  Will discharge at this time [DW]    Clinical Course User Index [DW] Kandee Orion, MD     FINAL CLINICAL IMPRESSION(S) / ED DIAGNOSES   Final diagnoses:  Enteritis  Acute nonintractable headache, unspecified headache type     Rx / DC Orders   ED Discharge Orders          Ordered    butalbital-acetaminophen -caffeine (FIORICET) 50-325-40 MG tablet  Every 6 hours PRN        04/10/24 2140             Note:  This document was prepared using Dragon voice recognition software and may include unintentional dictation errors.   Kandee Orion, MD 04/10/24 2206

## 2024-04-28 ENCOUNTER — Other Ambulatory Visit: Payer: Self-pay | Admitting: Internal Medicine

## 2024-07-11 ENCOUNTER — Other Ambulatory Visit: Payer: Self-pay | Admitting: Internal Medicine

## 2024-07-11 DIAGNOSIS — G44201 Tension-type headache, unspecified, intractable: Secondary | ICD-10-CM

## 2024-07-13 ENCOUNTER — Ambulatory Visit
Admission: RE | Admit: 2024-07-13 | Discharge: 2024-07-13 | Disposition: A | Discharge status: Home / Self Care | Source: Ambulatory Visit | Attending: Internal Medicine | Admitting: Internal Medicine

## 2024-07-13 DIAGNOSIS — G44201 Tension-type headache, unspecified, intractable: Secondary | ICD-10-CM | POA: Insufficient documentation

## 2024-07-13 MED ORDER — GADOBUTROL 1 MMOL/ML IV SOLN
8.0000 mL | Freq: Once | INTRAVENOUS | Status: AC | PRN
  Administered 2024-07-13: 8 mL via INTRAVENOUS

## 2024-08-28 NOTE — Progress Notes (Signed)
 Chief Complaint:   Chief Complaint  Patient presents with  . Follow-up    Discuss sleep study.      Subjective:   Jesse Alvarado is a 58 y.o. male in today for follow up  History of Present Illness Jesse Alvarado is a 58 year old male with chronic migraines who presents with worsening headache symptoms.  We are doing a video visit today.  He experiences severe headaches that vary in location, sometimes affecting the entire head, and at other times localized to right side mostly.   Based on his symptoms, he has had migraines since he was very young.  But they have been bothering him more since June of this year.  He has seen a neurologist in [place] and has had likely Botox injections.  Has been tried on a few maintenance medications and none of them have helped. He is currently on nortriptyline and Qulipta which have been keeping his headaches at a dull 2-3/10 for the most part.  At least once a week he has been needing the Ubrelvy for intense headaches.  He is taking a few days of because of these bad headaches.  Has had a sleep study done showing sleep apnea almost 8 years ago.  Does not feel like his sleep apnea is still been working.  Time to change the CPAP.  Without CPAP, he does have headaches worsening, high blood pressure, witnessed apneic episodes and also daytime sleepiness.     Regarding gastrointestinal symptoms, he reports occasional difficulty swallowing and a history of acid reflux and bloating. He is currently taking Protonix . A previous upper endoscopy was performed about two years ago. He has experienced chronic cough and throat clearing, which have improved with gabapentin , taken at 300 mg three times daily. 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   Current Outpatient Medications  Medication Sig Dispense Refill  . albuterol  sulfate (PROAIR  RESPICLICK INHAL) Inhale into the lungs As needed    . atogepant (QULIPTA) 60 mg Tab Take 60 mg by mouth once  daily 30 tablet 2  . atorvastatin  (LIPITOR ) 80 MG tablet Take 80 mg by mouth once       . azelastine  (OPTIVAR ) 0.05 % ophthalmic solution Place 1 drop into both eyes once as needed    . azelastine -fluticasone  137-50 mcg/spray Spry Place into one nostril 2 (two) times daily    . baclofen (LIORESAL) 10 MG tablet Take 10 mg by mouth 3 (three) times daily    . blood-glucose sensor (DEXCOM G7 SENSOR) Devi USE ONE EACH EVERY 10 DAYS 3 each 12  . buPROPion  (WELLBUTRIN  XL) 300 MG XL tablet TAKE ONE TABLET BY MOUTH ONE TIME DAILY 90 tablet 1  . butalbital -acetaminophen -caffeine  (FIORICET) 50-325-40 mg tablet Take 1-2 tablets by mouth every 6 (six) hours as needed for Headache 30 tablet 1  . ciclopirox  (PENLAC ) 8 % topical nail solution Apply topically nightly 6.6 mL 3  . cranberry 500 mg Cap Take by mouth    . cyanocobalamin  (VITAMIN B12) 1,000 mcg/mL injection INJECT 1 ML INTRAMUSCULARLY ONCE A MONTH 1 mL 11  . diclofenac (VOLTAREN) 1 % topical gel Apply 2 g topically 4 (four) times daily 720 g 3  . DULoxetine  (CYMBALTA ) 30 MG DR capsule Take 2 capsules (60 mg total) by mouth once daily 180 capsule 1  . DYMISTA  137-50 mcg/spray Spry Place into one nostril 2 (two) times daily    . empagliflozin (JARDIANCE) 25 mg tablet Take 1 tablet (25 mg  total) by mouth once daily 30 tablet 11  . ezetimibe  (ZETIA ) 10 mg tablet Take by mouth    . fenofibrate  nanocrystallized (TRICOR ) 145 MG tablet TAKE ONE TABLET BY MOUTH ONE TIME DAILY 90 tablet 3  . ferrous gluconate  (FERGON) 324 MG tablet TAKE ONE TABLET BY MOUTH THREE TIMES A DAY WITH FOOD 270 tablet 1  . folic acid  (FOLVITE ) 1 MG tablet TAKE ONE TABLET BY MOUTH ONE TIME DAILY 90 tablet 1  . gabapentin  (NEURONTIN ) 300 MG capsule TAKE ONE CAPSULE BY MOUTH THREE TIMES A DAY 90 capsule 3  . glimepiride  (AMARYL ) 4 MG tablet TAKE ONE TABLET BY MOUTH TWICE A DAY BEFORE MEALS 180 tablet 1  . ipratropium (ATROVENT ) 0.06 % nasal spray Place 2 sprays into both nostrils 2  (two) times daily     3  . JANUVIA 100 mg tablet TAKE ONE TABLET BY MOUTH ONE TIME DAILY 30 tablet 11  . ketoconazole (NIZORAL) 2 % shampoo     . levocetirizine (XYZAL ) 5 MG tablet Take 5 mg by mouth every evening       . losartan  (COZAAR ) 50 MG tablet TAKE ONE TABLET BY MOUTH ONE TIME DAILY 90 tablet 3  . meloxicam  (MOBIC ) 15 MG tablet Take 15 mg by mouth as needed for Pain    . metFORMIN  (GLUCOPHAGE ) 850 MG tablet TAKE ONE TABLET BY MOUTH THREE TIMES A DAY 270 tablet 3  . nortriptyline (PAMELOR) 10 MG capsule Take 10 mg nightly for one week, then increase to 20 mg nightly for one week and then increase to 30 mg at night for headaches. 90 capsule 2  . pantoprazole  (PROTONIX ) 40 MG DR tablet TAKE ONE TABLET BY MOUTH TWICE A DAY 180 tablet 1  . pioglitazone  (ACTOS ) 30 MG tablet TAKE ONE TABLET BY MOUTH ONE TIME DAILY 90 tablet 3  . pravastatin  (PRAVACHOL ) 40 MG tablet Take 40 mg by mouth at bedtime    . sucralfate  (CARAFATE ) 1 gram tablet TAKE ONE TABLET BY MOUTH THREE TIMES A DAY 270 tablet 1  . syringe with needle (SYRINGE 3CC/25GX1) 3 mL 25 gauge x 1 Syrg Use 1 each monthly. Dx E53.8 100 Syringe 1  . tadalafiL (CIALIS) 5 MG tablet Take 5 mg by mouth once daily    . tiZANidine  (ZANAFLEX ) 2 MG tablet TAKE 1 TABLET BY MOUTH THREE TIMES A DAY DAILY AS NEEDED FOR UP TO 90 DAYS, MAY TAKE 2 AT A TIME IF NEEDED. 90 tablet 2  . traMADoL  (ULTRAM ) 50 mg tablet TAKE ONE TABLET BY MOUTH EVERY 8 HOURS AS NEEDED FOR PAIN 21 tablet 0  . ubrogepant 100 mg Tab Take 100 mg by mouth once daily as needed (for migraine) 16 tablet 1  . VASCEPA  1 gram Cap Take 2 g by mouth 2 (two) times daily       . zonisamide (ZONEGRAN) 50 MG capsule Take 150 mg by mouth once daily .    . benzonatate  (TESSALON ) 200 MG capsule Take 200 mg by mouth 3 (three) times daily as needed for Cough (Patient not taking: Reported on 08/28/2024)     No current facility-administered medications for this visit.    Allergies as of 08/28/2024 -  Reviewed 08/28/2024  Allergen Reaction Noted  . Ciprocinonide Other (See Comments) 04/13/2014  . Ciprofloxacin  Other (See Comments) 09/19/2014  . Monosodium glutamate Diarrhea 11/15/2018  . Trulicity [dulaglutide] Other (See Comments) 11/21/2018    Past Medical History:  Diagnosis Date  . Allergy   .  Anemia   . Arthritis   . Barrett's esophagus without dysplasia 01/19/2019  . Chronic kidney disease   . Diabetes mellitus type 2, uncomplicated (CMS/HHS-HCC)    diagnosed 2005  . Diverticulitis 2015  . Diverticulosis 2015  . Erectile dysfunction   . FH: colon cancer 01/19/2019  . FH: colon polyps 01/19/2019  . GERD (gastroesophageal reflux disease)   . Hyperlipidemia   . Hypertension   . Migraine headache    since highschool  . Pancreatitis (HHS-HCC)   . Right shoulder injury 2010   with rupture of biceps tendon  . Sleep apnea     Past Surgical History:  Procedure Laterality Date  . Right shoulder surgery for ruptured biceps tendon  2010  . COLONOSCOPY  05/10/2014   FHCC (Brother), FH Colon Polyps (Father): CBF 05/2019: Recall ltr mailed   . Excision, right prepatellar bursa and ostectomy, Osgood-Schlatter lesion. Right 10/02/2014   Dr. Kathlynn  . EGD  12/27/2015   Barrett's Esophagus: CBF 12/2018: Recall ltr mailed   . KNEE ARTHROSCOPY Right 11/18/2018   RIGHT KNEE ARTHROSCOPY, MEDIAL MENISCUS REPAIR (HORIZONTAL TEAR OF POSTERIOR HORN), RIGHT KNEE CHONDROPLASTY OF PATELLA AND MEDIAL FEMORAL CONDYLE  . COLONOSCOPY  08/30/2019   Diverticulosis/FHx CC-Brother/Repeat 7yrs/TKT  . EGD  08/30/2019   Barrett's Esophagus/Repeat 71yrs/TKT  .  Left total hip arthroplasty Left 10/28/2021   Dr.Poggi  . Extensor tenosynovectomy with debridement of EDC tendon, right wrist. Right 08/12/2022   Dr. Edie  . Colon @ [facility]  12/16/2022   (Inpatient)  FHx CC/Repeat screening colonoscopy in 2029/TKT  . EGD @ [facility]  12/16/2022   (Inpatient) Procedures reviewed.  EGD showed mild esophageal  candidiasis but no dysphagia per Dr. Sanjuan note so doesn't require treatment. Repeat EGD prn/CTL  . COLONOSCOPY  12/16/2022   RectalBleeding/RptPRN/CTL  . EGD  12/16/2022   Melena/RptPRN/CTL  . COLONOSCOPY    . HERNIA REPAIR    . JOINT REPLACEMENT  10/2021  . knee surgery     10/11/2020  . UMBILICAL HERNIA REPAIR    . UPPER GASTROINTESTINAL ENDOSCOPY       Family History  Problem Relation Name Age of Onset  . No Known Problems Mother    . Skin cancer Father Ron   . Migraines Sister    . Cancer Brother [place]   . Colon cancer Brother Matt   . Lung cancer Brother Matt   . Colon cancer Brother    . Migraines Daughter    . Asthma Other    . Diabetes type II Other      Social History:  reports that he has never smoked. He has never used smokeless tobacco. He reports that he does not currently use alcohol. He reports that he does not use drugs.  Results for orders placed or performed in visit on 07/27/24  H. pylori Stool Ag, EIA - [facility]   Specimen: Stool  Result Value Ref Range   H pylori Ag, Stl - [facility] Negative Negative   Narrative   Performed at:  [address], [place], [place]  [id number] Lab Director: Frankey Sas MD, Phone:  [phone]      ROS:  General: No fever, chills or recent illness. No change in weight  HEENT: No change in vision or hearing. No pain or difficulty with swallowing Respiratory: No  shortness of breath, improved chronic cough CV:  No chest pain or palpitations GI:  No pain,   or change in bowel habits, dyspepsia and  bloating GU:  No dysuria, frequency, or hesitancy MSK:  No joint pain or injury Neurological: No headaches, changes in mental status, loss of sensation or strength   Psychological: No anxiety insomnia or depression  Objective:   There is no height or weight on file to calculate BMI.  There were no vitals taken for this visit.  Exam limited due to being virtual visit  General: WD/WN  male,  does not appear to be in distress Respiratory: Breathing is even.  No distress identified.  Able to speak in full sentences.  No use of accessory muscles to breathe. Musculoskeletal: Ambulating well without any difficulty Neuro: CN grossly intact.  Speech is clear.    Assessment/Plan:   Intractable tension-type headache, unspecified chronicity pattern  (primary encounter diagnosis) Dyspepsia Type 2 diabetes mellitus without complication, without long-term current use of insulin  (CMS/HHS-HCC)  Assessment & Plan   Chronic migraine with aura, intractable - Chronic migraines with aura, predominantly right-sided. Recent severe episode required ER visit. -  Current treatment includes Qulipta and nortriptyline.  - Awaiting neurology evaluation for Botox consideration.    - Currently headaches are maintained at 2-3/10 pain levels.  He has been using the Ubrelvy almost once or twice weekly - MRI of the brain does not show any concerning findings.  2. Obstructive sleep apnea - Long-standing obstructive sleep apnea. New sleep study required for updated CPAP machine approval. - Potential headache improvement with optimized CPAP therapy. - Order home sleep study.   3. Chronic cough and intermittent dysphagia - Chronic cough and intermittent dysphagia, partially improved with gabapentin .  - Continue gabapentin , monitor for dizziness and cognitive issues.   4. Epigastric pain and dyspepsia - Intermittent epigastric pain and dyspepsia, previously evaluated with upper endoscopy.  - Current treatment with Protonix . Symptoms include occasional dysphagia and chronic cough, partially managed with gabapentin . Considering medication tolerance. - Refer to gastroenterologist Dr. Aundria for further evaluation. - Change Protonix  to Aciphex.     This video encounter was conducted with the patient's (or proxy's) verbal consent via secure, interactive audio and video telecommunications while in  clinic/office/hospital.  The patient (or proxy) was instructed to have this encounter in a suitably private space and to only have persons present to whom they give permission to participate. In addition, patient identity was confirmed by use of name plus two identifiers.  This visit was coded based on medical decision making (MDM).      Goals     .  Follow my doctor's care plan    . * Lose Weight (pt-stated)      What: lose 10 pounds  When: now  Where: work  How: walking around the block at work  Importance: 9         LAVENIA BEAVER, MD  Portions of this note were created using dictation software and may contain typographical errors.   *Some images could not be shown.

## 2024-09-06 NOTE — Progress Notes (Signed)
 HPI: Jesse Alvarado is seen today for follow-up diabetes.  He is a 58 y.o. male who was diagnosed with diabetes around 2005.  I last saw him in 5/25.  At that time, I increased his Jardiance.  He says he has been stressed recently.   He is currently on MTF 850 three times a day, Actos  30 mg daily, Jardiance 25 mg once daily in the evening,  Januvia 100 mg QD, and glimepiride  2 mg bid.  Farxiga  was stopped due to UTIs.  Previously when he was on Trulicity, he developed pancreatitis.   He uses the DexCom G7 for glucose monitoring.  It was downloaded and reviewed.  The 14 day average was 175 with SD of 52. His TIR is 59%.  Review of his diet shows that he avoids concentrated carbohydrates for the most part.  He does not do specific exercises.  His weight has remained stable since last visit.  He is concerned about his diabetes.   ROS:  No chest pain.  No shortness of breath.  Medical History: Past Medical History:  Diagnosis Date  . Allergy   . Anemia   . Arthritis   . Barrett's esophagus without dysplasia 01/19/2019  . Chronic kidney disease   . Diabetes mellitus type 2, uncomplicated (CMS/HHS-HCC)    diagnosed 2005  . Diverticulitis 2015  . Diverticulosis 2015  . Erectile dysfunction   . FH: colon cancer 01/19/2019  . FH: colon polyps 01/19/2019  . GERD (gastroesophageal reflux disease)   . Hyperlipidemia   . Hypertension   . Migraine headache    since highschool  . Pancreatitis (HHS-HCC)   . Right shoulder injury 2010   with rupture of biceps tendon  . Sleep apnea     Surgical History: Past Surgical History:  Procedure Laterality Date  . Right shoulder surgery for ruptured biceps tendon  2010  . COLONOSCOPY  05/10/2014   FHCC (Brother), FH Colon Polyps (Father): CBF 05/2019: Recall ltr mailed   . Excision, right prepatellar bursa and ostectomy, Osgood-Schlatter lesion. Right 10/02/2014   Dr. Kathlynn  . EGD  12/27/2015   Barrett's Esophagus: CBF 12/2018: Recall ltr mailed    . KNEE ARTHROSCOPY Right 11/18/2018   RIGHT KNEE ARTHROSCOPY, MEDIAL MENISCUS REPAIR (HORIZONTAL TEAR OF POSTERIOR HORN), RIGHT KNEE CHONDROPLASTY OF PATELLA AND MEDIAL FEMORAL CONDYLE  . COLONOSCOPY  08/30/2019   Diverticulosis/FHx CC-Brother/Repeat 3yrs/TKT  . EGD  08/30/2019   Barrett's Esophagus/Repeat 83yrs/TKT  .  Left total hip arthroplasty Left 10/28/2021   Dr.Poggi  . Extensor tenosynovectomy with debridement of EDC tendon, right wrist. Right 08/12/2022   Dr. Edie  . Colon @ [facility]  12/16/2022   (Inpatient)  FHx CC/Repeat screening colonoscopy in 2029/TKT  . EGD @ [facility]  12/16/2022   (Inpatient) Procedures reviewed.  EGD showed mild esophageal candidiasis but no dysphagia per Dr. Sanjuan note so doesn't require treatment. Repeat EGD prn/CTL  . COLONOSCOPY  12/16/2022   RectalBleeding/RptPRN/CTL  . EGD  12/16/2022   Melena/RptPRN/CTL  . COLONOSCOPY    . HERNIA REPAIR    . JOINT REPLACEMENT  10/2021  . knee surgery     10/11/2020  . UMBILICAL HERNIA REPAIR    . UPPER GASTROINTESTINAL ENDOSCOPY      Social History:  reports that he has never smoked. He has never used smokeless tobacco. He reports that he does not currently use alcohol. He reports that he does not use drugs.  He is married.  He works for [employer].  Family History: family history includes Asthma in an other family member; Cancer in his brother; Colon cancer in his brother and brother; Diabetes type II in an other family member; Lung cancer in his brother; Migraines in his daughter and sister; No Known Problems in his mother; Skin cancer in his father.  Medications: Outpatient Medications Marked as Taking for the 09/06/24 encounter (Office Visit) with Cherilyn Debby Quivers, MD  Medication Sig Dispense Refill  . glimepiride  (AMARYL ) 4 MG tablet Take 1 tablet (4 mg total) by mouth 2 (two) times daily before meals 180 tablet 4     Allergies: Allergies  Allergen Reactions  . Ciprocinonide Other (See  Comments)    GI upset  . Ciprofloxacin  Other (See Comments)    GI upset  . Monosodium Glutamate Diarrhea  . Trulicity [Dulaglutide] Other (See Comments)    Caused pancreatitis     Physical Exam: Vitals:   09/06/24 1541  BP: 116/70  Pulse: 82  SpO2: 99%  Weight: 81.6 kg (180 lb)  Height: 177.8 cm (5' 10)    Body mass index is 25.83 kg/m. GEN: well developed male in NAD.  Physical exam otherwise deferred due to coronavirus precautions.    Labs: 04/13/2016: A1c = 7.6 04/08/2017: A1c = 7.5 07/31/2018: A1c = 7.4.  K/Cr/Ca = 4.0/1.01/8.1. 04/21/2019: Cholesterol = 158/96/38/101 06/02/2019: A1c = 7.2.  K/Cr/Ca = 4.1/1.35/9.7, eGFR = 59.  LFTs normal. 09/25/2019: A1c = 7.2.  K/Cr/Ca = 4.4/1.25/9.6.  Chol. = 134/70/39/81.    02/26/2020:  A1c=7.9.  Fructosamine= 259(c/w a1c~6.0).  K/Cr/Ca=4.2/1.4/9.9.  MA<7.  Chol=117/121/34/59.  LFTs nl.  TSH=2.522.  10/09/2020: A1c = 8.7.  Fructosamine =286 (c/w A1c~6.5).  K/Cr/Ca = 4.7/1.4/9.6.  Microalbumin <7.  Cholesterol = 117/92/40.9/58.  LFTs.  TSH = 3.175.  04/10/2021: A1c = 9.3.  Fructosamine = 283(c/w A1c~6.4).  Glucose = 141.  K/Cr/Ca = 4.8/1.4/10.0.  Microalbumin <7.  Cholesterol = 130/216/34.4/52.  LFTs normal.  TSH = 3.144. 10/15/2021: A1c =8.1.  K/Cr/Ca = 3.7/1.22/9.4.  LFTs normal. 10/21/2021: Fructosamine =271 (c/w A1c~6.2).  Microalbumin <7.  Cholesterol = 124/132/32.9/65.  TSH = 2.084.   03/16/2022:  A1c=9.8.  Fructosamine= 322(c/w a1c~7.1).  Glucose=181.  K/Cr/Ca= 4.6/1.5/9.6.  LFTs nl. 07/22/2022:  A1c=7.5.  Fructosamine= 247(c/w A1c~5.8).  Glucose=128.  K/Cr/Ca= 4.2/1.21/9.8.  MA=131.  Chol=96/84/33.3/46.  LFTS nl.  TSH=2.756.  B12=337.    12/15/2022:  A1c = 7.0 12/18/2022:  A1c = 7.4.  Fructosamine=263 (c/w A1c~6.1).  Glucose=128.  K/Cr/Ca= 4.7/1.3/9.9, eGFR=64.  B12=425.  01/12/2023:  K/Cr/Ca = 4.5/1.2/9.7.  LFTs nl.  04/28/2023:  A1c = 7.1 10/21/2023:  A1c = 7.3.  02/04/2024:  A1c= 7.1.  K/Cr/Ca=4.4/1.7/9.7,  eGFR=46.  MA=54.1.   Chol=111/73/38.4/58.  LFTs nl.  TSH= 3.302.  07/18/2024: A1c = 7.6.  K/Cr/Ca = 4.3/1.6/9.5.  LFTs nl.   Assessment/Plan: 1.  Diabetes.  His A1c from 9/25 was 7.6.   Based on review of his CGM and A1c, I will increase his Glimepiride  to 4 mg BID. I sent an rx.  Given his history of pancreatitis on Trulicity, will avoid GLP-1.  I encouraged continued lifestyle modifications including diet and exercise.   2.  Hypertension/chronic kidney disease associated with diabetes.  His blood pressure is good today on only 50 mg of losartan  daily.  3.  Hyperlipidemia associated with diabetes.  He sees a lipid specialist in [place] (Dr. Mona).  Lipid panel in 3/25 showed an LDL of 58.  Triglycerides were good.  He is on Lipitor  80, Vascepa  and fenofibrate .  4.  Coronary calcifications.  He had calcification seen on a CT scan and was referred to cardiology (Dr. Flavio).  He last saw them in 3/25.  He is to f/u in 01/2026.  5.  Frequent UTIs in the setting of self-catheterization and persistent kidney stones.  Follows with urology.  He says he has not had one since his last kidney stone.  6.  Prophylaxis.  I did a foot exam in 5/25.  We last got a note from [facility] from 02/15/24  He had no retinopathy and is to return in 4/26.    7.  He will return to clinic in 5 months.  This note is partially prepared by [facility], Scribe, in the presence of and acting as the scribe of Dr. Debby Breaker , MD.   [facility], MD

## 2024-09-14 ENCOUNTER — Emergency Department
Admission: EM | Admit: 2024-09-14 | Discharge: 2024-09-14 | Disposition: A | Discharge status: Home / Self Care | Attending: Emergency Medicine | Admitting: Emergency Medicine

## 2024-09-14 ENCOUNTER — Other Ambulatory Visit: Payer: Self-pay

## 2024-09-14 ENCOUNTER — Emergency Department

## 2024-09-14 DIAGNOSIS — E119 Type 2 diabetes mellitus without complications: Secondary | ICD-10-CM | POA: Insufficient documentation

## 2024-09-14 DIAGNOSIS — I10 Essential (primary) hypertension: Secondary | ICD-10-CM | POA: Diagnosis not present

## 2024-09-14 DIAGNOSIS — N50812 Left testicular pain: Secondary | ICD-10-CM | POA: Diagnosis present

## 2024-09-14 DIAGNOSIS — N452 Orchitis: Secondary | ICD-10-CM | POA: Insufficient documentation

## 2024-09-14 LAB — URINALYSIS, ROUTINE W REFLEX MICROSCOPIC
Bilirubin Urine: NEGATIVE
Glucose, UA: >=500 mg/dL — AB
Hgb urine dipstick: NEGATIVE
Ketones, ur: NEGATIVE mg/dL
Leukocytes,Ua: NEGATIVE
Nitrite: NEGATIVE
Protein, ur: NEGATIVE mg/dL
Specific Gravity, Urine: 1.024 (ref 1.005–1.030)
pH: 5 (ref 5.0–8.0)

## 2024-09-14 MED ORDER — LEVOFLOXACIN 500 MG PO TABS: 500.0000 mg | ORAL_TABLET | Freq: Every day | ORAL | 0 refills | Status: AC

## 2024-09-14 MED ORDER — TRAMADOL HCL 50 MG PO TABS
50.0000 mg | ORAL_TABLET | Freq: Once | ORAL | Status: AC
  Administered 2024-09-14: 50 mg via ORAL
  Filled 2024-09-14: qty 1

## 2024-09-14 MED ORDER — SODIUM CHLORIDE 0.9 % IV BOLUS: 1000.0000 mL | Freq: Once | INTRAVENOUS | Status: DC

## 2024-09-14 MED ORDER — LEVOFLOXACIN 500 MG PO TABS
500.0000 mg | ORAL_TABLET | Freq: Once | ORAL | Status: AC
  Administered 2024-09-14: 500 mg via ORAL
  Filled 2024-09-14: qty 1

## 2024-09-14 NOTE — ED Triage Notes (Signed)
 Patient states right sided testicular pain since yesterday; denies urinary symptoms but does have to self cath.

## 2024-09-14 NOTE — Discharge Instructions (Signed)
 Take the antibiotic as prescribed and finish the full course.   Return to the ER for new, worsening, or persistent severe testicular pain or swelling, worsening difficulty urinating, abdominal pain, vomiting, fever, or any other new or worsening symptoms that concern you.

## 2024-09-14 NOTE — ED Notes (Signed)
 Pt verbalizes understanding of discharge instructions. Opportunity for questioning and answers were provided. Pt discharged from ED with wife.

## 2024-09-14 NOTE — ED Provider Notes (Signed)
 [facility] Provider Note    Event Date/Time   First MD Initiated Contact with Patient 09/14/24 2132     (approximate)   History   Testicle Pain   HPI  LADARRIUS BOGDANSKI is a 58 y.o. male with a history of diabetes, hypertension, hyperlipidemia, GERD, and self cathing due to bladder dysfunction who presents with right testicular pain, acute onset yesterday, persistent course, associated with swelling.  The patient denies any dysuria, frequency, or hematuria.  He has no abdominal pain, vomiting, or any fever or chills.  He denies any trauma to the testicle.  I reviewed the past medical records.  The patient's most recent outpatient encounter was on 10/29 with endocrinology for follow-up of his diabetes.  Physical Exam   Triage Vital Signs: ED Triage Vitals [09/14/24 1806]  Encounter Vitals Group     BP 121/82     Girls Systolic BP Percentile      Girls Diastolic BP Percentile      Boys Systolic BP Percentile      Boys Diastolic BP Percentile      Pulse Rate 89     Resp 18     Temp 98.3 F (36.8 C)     Temp Source Oral     SpO2 98 %     Weight      Height      Head Circumference      Peak Flow      Pain Score      Pain Loc      Pain Education      Exclude from Growth Chart     Most recent vital signs: Vitals:   09/14/24 1806 09/14/24 2130  BP: 121/82 114/76  Pulse: 89 86  Resp: 18 18  Temp: 98.3 F (36.8 C)   SpO2: 98% 98%     General: Awake, no distress.  CV:  Good peripheral perfusion.  Resp:  Normal effort.  Abd:  Soft and nontender.  No distention.  Other:  Normal external genitalia.  No penile discharge.  Right testicle swollen and mildly tender.  No lymphadenopathy.   ED Results / Procedures / Treatments   Labs (all labs ordered are listed, but only abnormal results are displayed) Labs Reviewed  URINALYSIS, ROUTINE W REFLEX MICROSCOPIC - Abnormal; Notable for the following components:      Result Value   Color, Urine  STRAW (*)    APPearance CLEAR (*)    Glucose, UA >=500 (*)    Bacteria, UA RARE (*)    All other components within normal limits  URINE CULTURE     EKG    RADIOLOGY  US  scrotum:  IMPRESSION:  1. Slightly hypervascular right testicle compared to the left, correlate for  orchitis.    PROCEDURES:  Critical Care performed: No  Procedures   MEDICATIONS ORDERED IN ED: Medications  sodium chloride  0.9 % bolus 1,000 mL (1,000 mLs Intravenous Not Given 09/14/24 2215)  traMADol  (ULTRAM ) tablet 50 mg (50 mg Oral Given 09/14/24 2209)  levofloxacin (LEVAQUIN) tablet 500 mg (500 mg Oral Given 09/14/24 2209)     IMPRESSION / MDM / ASSESSMENT AND PLAN / ED COURSE  I reviewed the triage vital signs and the nursing notes.  58 year old male with PMH as noted above presents with atraumatic right testicular pain and swelling for the last day.  On exam the testicle is mildly swollen and tender.  His vital signs are normal and he is overall well-appearing.  Differential  diagnosis includes, but is not limited to, epididymitis, orchitis, varicocele, less likely torsion.  Scrotal ultrasound shows findings compatible with orchitis.  The patient does not have a high risk sexual history and does frequent self cath for urinary retention.  Therefore we will treat for presumed enteric/urinary source.  He has a history of intolerance to ciprofloxacin .  I have given a dose of Levaquin here and prescribe the same for home.  Alysis was obtained and shows 11-20 WBCs.  Urine culture was sent as well.  Patient's presentation is most consistent with acute complicated illness / injury requiring diagnostic workup.     FINAL CLINICAL IMPRESSION(S) / ED DIAGNOSES   Final diagnoses:  Orchitis     Rx / DC Orders   ED Discharge Orders          Ordered    levofloxacin (LEVAQUIN) 500 MG tablet  Daily        09/14/24 2201             Note:  This document was prepared using Dragon voice  recognition software and may include unintentional dictation errors.    Jacolyn Pae, MD 09/14/24 2256

## 2024-09-14 NOTE — ED Notes (Signed)
 Urine sample collected

## 2024-09-17 LAB — URINE CULTURE: Culture: >=100000 — AB

## 2024-09-18 ENCOUNTER — Other Ambulatory Visit (HOSPITAL_COMMUNITY): Payer: Self-pay

## 2024-09-18 NOTE — Progress Notes (Signed)
 ED Antimicrobial Stewardship Positive Culture Follow Up   Jesse Alvarado is an 58 y.o. male who presented to [facility] on 09/14/2024 with a chief complaint of  Chief Complaint  Patient presents with   Testicle Pain    Recent Results (from the past 720 hours)  Urine Culture     Status: Abnormal   Collection Time: 09/14/24 10:16 PM   Specimen: Urine, Clean Catch  Result Value Ref Range Status   Specimen Description   Final    URINE, CLEAN CATCH Performed at [facility], [address] [place], [place] [address]    Special Requests   Final    NONE Performed at [facility], [address] [place], [place] [address]    Culture >=100,000 COLONIES/mL STAPHYLOCOCCUS EPIDERMIDIS (A)  Final   Report Status 09/17/2024 FINAL  Final   Organism ID, Bacteria STAPHYLOCOCCUS EPIDERMIDIS (A)  Final      Susceptibility   Staphylococcus epidermidis - MIC*    CIPROFLOXACIN  <=0.5 SENSITIVE Sensitive     GENTAMICIN  <=0.5 SENSITIVE Sensitive     NITROFURANTOIN  <=16 SENSITIVE Sensitive     OXACILLIN >=4 RESISTANT Resistant     TETRACYCLINE 2 SENSITIVE Sensitive     VANCOMYCIN 2 SENSITIVE Sensitive     TRIMETH /SULFA  >=320 RESISTANT Resistant     RIFAMPIN <=0.5 SENSITIVE Sensitive     Inducible Clindamycin  NEGATIVE Sensitive     * >=100,000 COLONIES/mL STAPHYLOCOCCUS EPIDERMIDIS    ED Provider: Dr. Dicky   Patient is a 58 year old male who presented to the ED with right sided testicular pain. He denied any urinary symptoms but mentioned that he self caths due to bladder dysfunction. Imaging was concerning for orchitis and he was discharged on levofloxacin.  Urine culture resulted with over 100,000 colonies of staphylococcus epidermidis which could represent contamination versus a true pathogen. Discussed with ED provider and agreed to follow up with the patient to assess how he is doing before making any changes to therapy.  Called patient to check in. He reported feeling  better overall with less pain and no fevers, though still having some mild burning. He mentioned having a follow up appointment with urology next week. Advised him to let them know if he feels like he is not improving or has any trouble with the antibiotic. Patient verbalized understanding.   Ransom Blanch PGY-1 Pharmacy Resident  [facility]  09/18/2024 5:09 PM

## 2024-09-25 NOTE — Progress Notes (Signed)
 Subjective:     Patient ID: Jesse Alvarado is a 58 y.o. male.  HPI: PRESENTS WITH LEFT KNEE CLICKING AND POPPING. ONSET 2 DAYS AGO. HAS ACHING PAIN. REPORTS SOME SWELLING. NEAR PATELLA AND POSTERIORLY. NO NUMBNESS. NO CALF PAIN OR SWELLING. TAING TRAMADOL  . WITH SOME RELIEF. HAS HAD SURGERY ON RIGHT KNEE. REPORTS NO INJURY. NO NEW ACTIVITY. RECORDS REVIEWED.   Past Medical History:  has a past medical history of Allergy, Anemia, Arthritis, Barrett's esophagus without dysplasia (01/19/2019), Chronic kidney disease, Diabetes mellitus type 2, uncomplicated (CMS/HHS-HCC), Diverticulitis (2015), Diverticulosis (2015), Erectile dysfunction, FH: colon cancer (01/19/2019), FH: colon polyps (01/19/2019), GERD (gastroesophageal reflux disease), Hyperlipidemia, Hypertension, Migraine headache, Pancreatitis (HHS-HCC), Right shoulder injury (2010), and Sleep apnea. Past Surgical History:  has a past surgical history that includes Right shoulder surgery for ruptured biceps tendon (2010); Umbilical hernia repair; Colonoscopy (05/10/2014); Excision, right prepatellar bursa and ostectomy, Osgood-Schlatter lesion. (Right, 10/02/2014); egd (12/27/2015); Knee arthroscopy (Right, 11/18/2018); Colonoscopy (08/30/2019); egd (08/30/2019); knee surgery;  Left total hip arthroplasty (Left, 10/28/2021); Hernia repair; Joint replacement (10/2021); Extensor tenosynovectomy with debridement of EDC tendon, right wrist. (Right, 08/12/2022); Colon @ [facility] (12/16/2022); EGD @ [facility] (12/16/2022); Colonoscopy (12/16/2022); egd (12/16/2022); Colonoscopy; and Upper gastrointestinal endoscopy. Family History: family history includes Asthma in an other family member; Cancer in his brother; Colon cancer in his brother and brother; Diabetes type II in an other family member; Lung cancer in his brother; Migraines in his daughter and sister; No Known Problems in his mother; Skin cancer in his father. Social History:  reports that he has never smoked.  He has never used smokeless tobacco. He reports that he does not currently use alcohol. He reports that he does not use drugs. Prior to encounter Medications:  Current Outpatient Medications on File Prior to Visit  Medication Sig Dispense Refill  . albuterol  sulfate (PROAIR  RESPICLICK INHAL) Inhale into the lungs As needed    . atogepant (QULIPTA) 60 mg Tab Take 60 mg by mouth once daily 30 tablet 2  . atorvastatin  (LIPITOR ) 80 MG tablet Take 80 mg by mouth once       . azelastine  (OPTIVAR ) 0.05 % ophthalmic solution Place 1 drop into both eyes once as needed    . azelastine -fluticasone  137-50 mcg/spray Spry Place into one nostril 2 (two) times daily    . baclofen (LIORESAL) 10 MG tablet Take 10 mg by mouth 3 (three) times daily    . blood-glucose sensor (DEXCOM G7 SENSOR) Devi USE ONE EACH EVERY 10 DAYS 3 each 12  . buPROPion  (WELLBUTRIN  XL) 300 MG XL tablet TAKE ONE TABLET BY MOUTH ONE TIME DAILY 90 tablet 1  . butalbital -acetaminophen -caffeine  (FIORICET) 50-325-40 mg tablet Take 1-2 tablets by mouth every 6 (six) hours as needed for Headache 30 tablet 1  . ciclopirox  (PENLAC ) 8 % topical nail solution Apply topically nightly 6.6 mL 3  . cranberry 500 mg Cap Take by mouth    . cyanocobalamin  (VITAMIN B12) 1,000 mcg/mL injection INJECT 1 ML INTRAMUSCULARLY ONCE A MONTH 1 mL 11  . DULoxetine  (CYMBALTA ) 30 MG DR capsule Take 2 capsules (60 mg total) by mouth once daily 180 capsule 1  . DYMISTA  137-50 mcg/spray Spry Place into one nostril 2 (two) times daily    . empagliflozin (JARDIANCE) 25 mg tablet Take 1 tablet (25 mg total) by mouth once daily 30 tablet 11  . fenofibrate  nanocrystallized (TRICOR ) 145 MG tablet TAKE ONE TABLET BY MOUTH ONE TIME DAILY 90 tablet 3  . ferrous  gluconate (FERGON) 324 MG tablet TAKE ONE TABLET BY MOUTH THREE TIMES A DAY WITH FOOD 270 tablet 1  . folic acid  (FOLVITE ) 1 MG tablet TAKE ONE TABLET BY MOUTH ONE TIME DAILY 90 tablet 1  . gabapentin  (NEURONTIN ) 300 MG  capsule TAKE ONE CAPSULE BY MOUTH THREE TIMES A DAY 90 capsule 3  . glimepiride  (AMARYL ) 4 MG tablet Take 1 tablet (4 mg total) by mouth 2 (two) times daily before meals 180 tablet 4  . ipratropium (ATROVENT ) 0.06 % nasal spray Place 2 sprays into both nostrils 2 (two) times daily     3  . JANUVIA 100 mg tablet TAKE ONE TABLET BY MOUTH ONE TIME DAILY 30 tablet 11  . ketoconazole (NIZORAL) 2 % shampoo     . levocetirizine (XYZAL ) 5 MG tablet Take 5 mg by mouth every evening       . losartan  (COZAAR ) 50 MG tablet TAKE ONE TABLET BY MOUTH ONE TIME DAILY 90 tablet 3  . metFORMIN  (GLUCOPHAGE ) 850 MG tablet TAKE ONE TABLET BY MOUTH THREE TIMES A DAY 270 tablet 3  . nortriptyline (PAMELOR) 10 MG capsule Take 10 mg nightly for one week, then increase to 20 mg nightly for one week and then increase to 30 mg at night for headaches. 90 capsule 2  . onabotulinumtoxinA (BOTOX) 200 unit SolR Provider to inject up to 200 units into multiple areas of head and neck once every 90 days 1 each 1  . pioglitazone  (ACTOS ) 30 MG tablet TAKE ONE TABLET BY MOUTH ONE TIME DAILY 90 tablet 3  . pravastatin  (PRAVACHOL ) 40 MG tablet Take 40 mg by mouth at bedtime    . RABEprazole (ACIPHEX) 20 mg EC tablet Take 1 tablet (20 mg total) by mouth 2 (two) times daily 60 tablet 2  . sucralfate  (CARAFATE ) 1 gram tablet TAKE ONE TABLET BY MOUTH THREE TIMES A DAY 270 tablet 1  . syringe with needle (SYRINGE 3CC/25GX1) 3 mL 25 gauge x 1 Syrg Use 1 each monthly. Dx E53.8 100 Syringe 1  . tiZANidine  (ZANAFLEX ) 2 MG tablet TAKE 1 TABLET BY MOUTH THREE TIMES A DAY DAILY AS NEEDED FOR UP TO 90 DAYS, MAY TAKE 2 AT A TIME IF NEEDED. 90 tablet 2  . traMADoL  (ULTRAM ) 50 mg tablet TAKE ONE TABLET BY MOUTH EVERY 8 HOURS AS NEEDED FOR PAIN 21 tablet 0  . ubrogepant 100 mg Tab Take 100 mg by mouth once daily as needed (for migraine) 16 tablet 1  . VASCEPA  1 gram Cap Take 2 g by mouth 2 (two) times daily       . diclofenac (VOLTAREN) 1 % topical gel  Apply 2 g topically 4 (four) times daily 720 g 3  . ezetimibe  (ZETIA ) 10 mg tablet Take by mouth    . HYDROcodone -acetaminophen  (NORCO) 5-325 mg tablet TK 1-2 TS PO Q 4 H PRF MODERATE OR SEVERE PAIN (Patient not taking: Reported on 09/25/2024)    . meloxicam  (MOBIC ) 15 MG tablet Take 15 mg by mouth as needed for Pain (Patient not taking: Reported on 09/25/2024)    . tadalafiL (CIALIS) 5 MG tablet Take 5 mg by mouth once daily (Patient not taking: Reported on 09/25/2024)    . zonisamide (ZONEGRAN) 50 MG capsule Take 150 mg by mouth once daily . (Patient not taking: Reported on 09/25/2024)     No current facility-administered medications on file prior to visit.   Allergies: is allergic to ciprocinonide, ciprofloxacin , monosodium glutamate, and trulicity [dulaglutide].  This an established patient is here today for: office visit.  Review of Systems     Objective:   Physical Exam Vitals and nursing note reviewed.  Constitutional:      General: He is not in acute distress.    Appearance: Normal appearance. He is not ill-appearing or toxic-appearing.  Cardiovascular:     Rate and Rhythm: Normal rate and regular rhythm.  Pulmonary:     Effort: Pulmonary effort is normal. No respiratory distress.     Breath sounds: Normal breath sounds.  Musculoskeletal:     Comments: EVAL OF LEFT KNEE. SLIGHT SWELLING AND WARM. NO ERYTHEMA , NO SKIN CHANGES. PAIN OVER LCL . PAIN WITH VARUS STRESS  NEG DRAWER. NO POP FULLNESS.  CREPITANCE WITH ROM AT PATELLAR FEM ARTICULATION.   Skin:    General: Skin is warm and dry.     Findings: No rash.  Neurological:     Mental Status: He is alert.        Assessment:     1. Popping of left knee joint   -     X-ray knee left 3 views -     predniSONE (DELTASONE) 20 MG tablet; Take 1 tablet (20 mg total) by mouth 2 (two) times daily for 5 days     Plan:     Patient Instructions  XRAY APPEARS GOOD SENT TO RADIOLOGY RECOMMEND KNEE SLEEVE PREDNISONE AS  DIRECTED IF SYMPTOMS PERSIST RECOMMEND ORTHO EVAL

## 2024-10-24 ENCOUNTER — Encounter (INDEPENDENT_AMBULATORY_CARE_PROVIDER_SITE_OTHER): Payer: Self-pay

## 2024-11-07 ENCOUNTER — Other Ambulatory Visit: Payer: Self-pay | Admitting: Otolaryngology

## 2024-11-07 DIAGNOSIS — K111 Hypertrophy of salivary gland: Secondary | ICD-10-CM

## 2024-11-14 ENCOUNTER — Ambulatory Visit
Admission: RE | Admit: 2024-11-14 | Discharge: 2024-11-14 | Disposition: A | Discharge status: Home / Self Care | Payer: Self-pay | Source: Ambulatory Visit | Attending: Otolaryngology | Admitting: Otolaryngology

## 2024-11-14 DIAGNOSIS — K111 Hypertrophy of salivary gland: Secondary | ICD-10-CM | POA: Diagnosis present

## 2024-11-14 MED ORDER — IOHEXOL 300 MG/ML  SOLN
75.0000 mL | Freq: Once | INTRAMUSCULAR | Status: AC | PRN
  Administered 2024-11-14: 75 mL via INTRAVENOUS

## 2024-11-27 ENCOUNTER — Ambulatory Visit (INDEPENDENT_AMBULATORY_CARE_PROVIDER_SITE_OTHER): Admitting: Otolaryngology

## 2024-11-27 ENCOUNTER — Encounter (INDEPENDENT_AMBULATORY_CARE_PROVIDER_SITE_OTHER): Payer: Self-pay | Admitting: Otolaryngology

## 2024-11-27 VITALS — BP 107/69 | HR 102 | Ht 69.0 in | Wt 175.0 lb

## 2024-11-27 DIAGNOSIS — J3489 Other specified disorders of nose and nasal sinuses: Secondary | ICD-10-CM | POA: Diagnosis not present

## 2024-11-27 DIAGNOSIS — R0982 Postnasal drip: Secondary | ICD-10-CM | POA: Diagnosis not present

## 2024-11-27 DIAGNOSIS — J342 Deviated nasal septum: Secondary | ICD-10-CM

## 2024-11-27 DIAGNOSIS — J310 Chronic rhinitis: Secondary | ICD-10-CM

## 2024-11-27 DIAGNOSIS — J3089 Other allergic rhinitis: Secondary | ICD-10-CM

## 2024-11-27 MED ORDER — AYR SALINE NASAL NA GEL: 1.0000 | NASAL | 10 refills | Status: AC

## 2024-11-27 MED ORDER — MUPIROCIN 2 % EX OINT: 1.0000 | TOPICAL_OINTMENT | Freq: Two times a day (BID) | CUTANEOUS | 0 refills | Status: AC

## 2024-11-27 NOTE — Progress Notes (Unsigned)
 Dear Dr. Sherial, Here is my assessment for our mutual patient, Jesse Alvarado. Thank you for allowing me the opportunity to care for your patient. Please do not hesitate to contact me should you have any other questions. Sincerely, Dr. Eldora Blanch  Otolaryngology Clinic Note  HISTORY: Initial visit (11/2024): Discussed the use of AI scribe software for clinical note transcription with the patient, who gave verbal consent to proceed.  History of Present Illness Jesse Alvarado is a 59 year old male who presents for evaluation of persistent nasal dryness with recurrent nasal ulceration and chronic nasal drainage.  Since late December, he has had a recurrent tender ulcer on the anterior left nostril that intermittently fissures, and feels dry, irritated, and crusted. Flares often follow frequent nose wiping for rhinorrhea which he does do a fair amount. He applies topical Neosporin for relief and trims nasal hair monthly at the nostril edge. Otherwise no nasal trauma  He has chronic anterior and posterior nasal drainage, which he attributes to allergic rhinitis and environmental dryness. He uses INCS and IN-anthistamine plus a saline spray. He is not currently using saline irrigations. He denies nasal obstruction or facial pressure/pain currently. He uses CPAP nightly with humidification and a full-face mask which may worsen sx. No epistaxis or facial numbness.  He has recurrent acute sinusitis with about four episodes in the past year, usually in fall, winter, and spring, with yellow to green nasal discharge, mild facial pressure, and occasional aural fullness. Antibiotics help, while corticosteroids do not. He does not routinely use nasal irrigations. He notes a sensitive sense of smell. Sx resolve after treatment. He has not had sinonasal surgery.  He dose have migraines and is on medication.   He has sneezing and lacrimation and takes levocetirizine. Prior allergy testing was negative,  though he believes his symptoms are allergic.  He follows with Dr. Brien ([facility]) for chronic cough  AP/AC: no  Tobacco: no  PMHx: Migraines, HTN, DM, HLD, OSA  RADIOGRAPHIC EVALUATION AND INDEPENDENT REVIEW OF OTHER RECORDS:: Dr. Brien (11/13/2024): chronic cough, dysphagia, dysphonia; Rx: continue gabapentin ; f/u in 6 months with MBS CBC and CMP 07/18/2024: BUN/Cr 20/1.6; WBC 7.4, Eos 30 CT Neck 11/14/2024 and MRI 07/13/2024 interpreted with respect to sinuses: paranasal sinuses essentially clear except for small left > right max likely mucous retention cyst, right septal deviation Dr. Sherial (10/20/2024): ref to ENT for sinusitis Dr. Maree (10/17/2024): Neuro - h/o migraines, right temporal pain with photophobia; botox injections Dec 2025, started on ajovy Past Medical History:  Diagnosis Date   Anemia    Arthritis    Asthma    sports induced asthma. takes inhalers when needed   Barrett's esophagus without dysplasia    Cancer (HCC)    Basal and squamoous cell   Chronic kidney disease    Complication of anesthesia    high tolerance to pain medication. body absorbs pain med quickly   Coronary artery disease    Cough on exercise    Diabetes mellitus without complication (HCC)    Diverticulosis    Erectile dysfunction    Fatty liver    GERD (gastroesophageal reflux disease)    History of kidney stones    Hyperlipidemia    Hypertension    per patient, he does not have high bp but is treated for his kidneys and his diabetes   Pancreatitis    Right shoulder injury    Sleep apnea    use C-PAP   Past Surgical History:  Procedure  Laterality Date   ANKLE GANGLION CYST EXCISION Left    x 5   CARPAL TUNNEL RELEASE Right 10/03/2018   Procedure: CARPAL TUNNEL RELEASE;  Surgeon: Cleotilde Barrio, MD;  Location: [facility];  Service: Orthopedics;  Laterality: Right;   CARPAL TUNNEL RELEASE Left 10/24/2018   Procedure: CARPAL TUNNEL RELEASE;  Surgeon: Cleotilde Barrio, MD;  Location: [facility];   Service: Orthopedics;  Laterality: Left;   COLONOSCOPY  05/10/2014   COLONOSCOPY N/A 12/16/2022   Procedure: COLONOSCOPY;  Surgeon: Maryruth Ole DASEN, MD;  Location: [facility];  Service: Endoscopy;  Laterality: N/A;   COLONOSCOPY WITH PROPOFOL  N/A 08/30/2019   Procedure: COLONOSCOPY WITH PROPOFOL ;  Surgeon: Toledo, Ladell POUR, MD;  Location: [facility];  Service: Gastroenterology;  Laterality: N/A;   CYSTOSCOPY WITH STENT PLACEMENT Bilateral 06/05/2016   Procedure: CYSTOSCOPY WITH STENT PLACEMENT;  Surgeon: Redell Lynwood Napoleon, MD;  Location: [facility];  Service: Urology;  Laterality: Bilateral;   CYSTOSCOPY/URETEROSCOPY/HOLMIUM LASER/STENT PLACEMENT Left 04/13/2016   Procedure: CYSTOSCOPY/RETROGRADE PYELOGRAM/URETEROSCOPY WITH HOLMIUM LASER LITHOTRIPSY//STENT PLACEMENT;  Surgeon: Donnice Brooks, MD;  Location: [facility];  Service: Urology;  Laterality: Left;   ESOPHAGOGASTRODUODENOSCOPY N/A 12/16/2022   Procedure: ESOPHAGOGASTRODUODENOSCOPY (EGD);  Surgeon: Maryruth Ole DASEN, MD;  Location: [facility] END[facility]COPY;  Service: Endoscopy;  Laterality: N/A;   ESOPHAGOGASTRODUODENOSCOPY (EGD) WITH PROPOFOL  N/A 12/27/2015   Procedure: ESOPHAGOGASTRODUODENOSCOPY (EGD) WITH PROPOFOL ;  Surgeon: Lamar DASEN Holmes, MD;  Location: [facility] ENDOSCOPY;  Service: Endoscopy;  Laterality: N/A;   ESOPHAGOGASTRODUODENOSCOPY (EGD) WITH PROPOFOL  N/A 08/30/2019   Procedure: ESOPHAGOGASTRODUODENOSCOPY (EGD) WITH PROPOFOL ;  Surgeon: Toledo, Ladell POUR, MD;  Location: [facility];  Service: Gastroenterology;  Laterality: N/A;   EXTRACORPOREAL SHOCK WAVE LITHOTRIPSY Right 12/30/2017   Procedure: EXTRACORPOREAL SHOCK WAVE LITHOTRIPSY (ESWL);  Surgeon: Kassie Ozell SAUNDERS, MD;  Location: [facility];  Service: Urology;  Laterality: Right;   EXTRACORPOREAL SHOCK WAVE LITHOTRIPSY Left 03/19/2022   Procedure: EXTRACORPOREAL SHOCK WAVE LITHOTRIPSY (ESWL);  Surgeon: Kassie Ozell SAUNDERS, MD;  Location: [facility];  Service: Urology;  Laterality:  Left;   EXTRACORPOREAL SHOCK WAVE LITHOTRIPSY Right 08/20/2022   Procedure: EXTRACORPOREAL SHOCK WAVE LITHOTRIPSY (ESWL);  Surgeon: Kassie Ozell SAUNDERS, MD;  Location: [facility];  Service: Urology;  Laterality: Right;   GANGLION CYST EXCISION Right 08/12/2022   Procedure: EXCISION OF DORSAL CARPAL GANGLION OF RIGHT WRIST;  Surgeon: Edie Norleen PARAS, MD;  Location: [facility];  Service: Orthopedics;  Laterality: Right;   HERNIA REPAIR  2007   umbilical   KNEE ARTHROSCOPY Right 2015   had bursa sack repaired   KNEE ARTHROSCOPY WITH MEDIAL MENISECTOMY Right 10/11/2020   Procedure: Right knee arthroscopy partial medial meniscectomy;  Surgeon: Tobie Priest, MD;  Location: [facility];  Service: Orthopedics;  Laterality: Right;  Diabetic - oral meds sleep apnea   KNEE ARTHROSCOPY WITH MENISCAL REPAIR Right 11/18/2018   Procedure: KNEE ARTHROSCOPY WITH MENISCAL REPAIR AND CHONDROPLASTY;  Surgeon: Tobie Priest, MD;  Location: [facility];  Service: Orthopedics;  Laterality: Right;  Diabetic - oral meds sleep apnea SUPINE WITH ACL LEG HOLDER SMITH AND NEWPHEW CETERIX   REPAIR EXTENSOR TENDON Right 10/22/2022   Procedure: EXTENSOR TENOSYNOVECTOMY OF RIGHT WRIST;  Surgeon: Edie Norleen PARAS, MD;  Location: [facility];  Service: Orthopedics;  Laterality: Right;   SHOULDER SURGERY Right 2011   tendon was shredded and was trimmed, repaired and reattached. Screws in shoulder   TOTAL HIP ARTHROPLASTY Left 10/28/2021   Procedure: TOTAL HIP ARTHROPLASTY;  Surgeon: Edie Norleen PARAS, MD;  Location: [facility];  Service: Orthopedics;  Laterality: Left;   URETEROSCOPY WITH HOLMIUM LASER LITHOTRIPSY Bilateral 06/05/2016   Procedure: URETEROSCOPY WITH HOLMIUM LASER LITHOTRIPSY;  Surgeon: Redell Lynwood Napoleon, MD;  Location: [facility];  Service: Urology;  Laterality: Bilateral;   VASECTOMY  2002   Family History  Problem Relation Age of Onset   Urolithiasis Father    Kidney disease Father    Prostate cancer Neg Hx     Kidney cancer Neg Hx    Social History   Tobacco Use   Smoking status: Never   Smokeless tobacco: Never  Substance Use Topics   Alcohol use: No    Comment: very rare etoh (Holidays)   Allergies[1] Current Outpatient Medications  Medication Sig Dispense Refill   acetaminophen  (TYLENOL ) 500 MG tablet Take 2 tablets (1,000 mg total) by mouth every 6 (six) hours. 30 tablet 0   albuterol  (VENTOLIN  HFA) 108 (90 Base) MCG/ACT inhaler Inhale 2 puffs into the lungs every 6 (six) hours as needed for wheezing or shortness of breath.     atorvastatin  (LIPITOR ) 80 MG tablet TAKE ONE TABLET BY MOUTH ONE TIME DAILY 90 tablet 2   azelastine  (OPTIVAR ) 0.05 % ophthalmic solution Place 1 drop into both eyes daily as needed.     Azelastine -Fluticasone  (DYMISTA ) 137-50 MCG/ACT SUSP Place 1 spray into both nostrils 2 (two) times daily.      buPROPion  (WELLBUTRIN  XL) 300 MG 24 hr tablet Take 300 mg by mouth daily.     butalbital -acetaminophen -caffeine  (FIORICET) 50-325-40 MG tablet Take 1-2 tablets by mouth every 6 (six) hours as needed for headache. 20 tablet 0   ciclopirox  (PENLAC ) 8 % solution Apply 1 application topically at bedtime. Apply over nail and surrounding skin. Apply daily over previous coat. After seven (7) days, may remove with alcohol and continue cycle. 6.6 mL 5   Cranberry (RA CRANBERRY) 500 MG CAPS Take by mouth.     cyanocobalamin  (VITAMIN B12) 1000 MCG/ML injection Inject into the muscle.     dapagliflozin  propanediol (FARXIGA ) 10 MG TABS tablet      DULoxetine  (CYMBALTA ) 30 MG capsule Take 60 mg by mouth daily.     ezetimibe  (ZETIA ) 10 MG tablet Take 1 tablet (10 mg total) by mouth daily. 90 tablet 3   fenofibrate  (TRICOR ) 145 MG tablet Take 1 tablet by mouth daily.     ferrous gluconate  (FERGON) 324 MG tablet Take 324 mg by mouth 3 (three) times daily with meals.     folic acid  (FOLVITE ) 1 MG tablet TAKE 1 TABLET(1 MG) BY MOUTH DAILY 90 tablet 0   gabapentin  (NEURONTIN ) 300 MG  capsule Take 300 mg by mouth 3 (three) times daily. For chronic cough/laryngeal inflammation     glimepiride  (AMARYL ) 2 MG tablet Take 1 tablet (2 mg total) by mouth 2 (two) times daily. Take 2mg  with breakfast, 2 mg with supper     glucose blood (PRECISION QID TEST) test strip Use once daily Use as instructed.     icosapent  Ethyl (VASCEPA ) 1 g capsule Take 2 capsules (2 g total) by mouth 2 (two) times daily. 360 capsule 3   ipratropium (ATROVENT ) 0.06 % nasal spray Place 2 sprays into both nostrils 2 (two) times daily.   3   JANUVIA 100 MG tablet Take 100 mg by mouth daily.     JARDIANCE 10 MG TABS tablet Take 10 mg by mouth daily.     ketoconazole (NIZORAL) 2 % shampoo      levocetirizine (XYZAL ) 5 MG tablet Take  5 mg by mouth every evening.   11   losartan  (COZAAR ) 50 MG tablet Take 50 mg by mouth every morning.     metFORMIN  (GLUCOPHAGE ) 850 MG tablet TAKE 1 TABLET(850 MG) BY MOUTH THREE TIMES DAILY WITH MEALS 270 tablet 1   mupirocin  ointment (BACTROBAN ) 2 % Apply 1 Application topically 2 (two) times daily for 14 days. 22 g 0   pantoprazole  (PROTONIX ) 40 MG tablet Take 1 tablet (40 mg total) by mouth daily. 30 tablet 1   pioglitazone  (ACTOS ) 30 MG tablet Take 30 mg by mouth daily.     QULIPTA 60 MG TABS Take 1 tablet by mouth daily.     saline (AYR) GEL Place 1 Application into both nostrils every 4 (four) hours. 14 g 10   sucralfate  (CARAFATE ) 1 g tablet TAKE 1 TABLET(1 GRAM) BY MOUTH THREE TIMES DAILY WITH MEALS 270 tablet 1   tadalafil (CIALIS) 20 MG tablet Take 5 mg by mouth daily.     tiZANidine  (ZANAFLEX ) 2 MG tablet Take 2 mg by mouth at bedtime as needed for muscle spasms.     traMADol  (ULTRAM ) 50 MG tablet Take 50 mg by mouth every 8 (eight) hours as needed.     benzonatate  (TESSALON ) 200 MG capsule Take 200 mg by mouth 3 (three) times daily as needed for cough. (Patient not taking: Reported on 11/27/2024)     buPROPion  (WELLBUTRIN  XL) 150 MG 24 hr tablet Take 150 mg by mouth  daily. (Patient not taking: Reported on 11/27/2024)     CALCIUM  CITRATE PO Take 500 mg by mouth 3 (three) times daily. (Patient not taking: Reported on 11/27/2024)     Continuous Blood Gluc Sensor (FREESTYLE LIBRE 2 SENSOR) MISC USE 1 KIT EVERY 14 DAYS FOR GLUCOSE MONITORING (Patient not taking: Reported on 11/27/2024)     LAGEVRIO 200 MG CAPS capsule Take 4 capsules by mouth 2 (two) times daily. (Patient not taking: Reported on 11/27/2024)     pioglitazone  (ACTOS ) 15 MG tablet Take 15 mg by mouth daily. (Patient not taking: Reported on 11/27/2024)     silodosin (RAPAFLO) 4 MG CAPS capsule Take 8 mg by mouth every evening. (Patient not taking: Reported on 11/27/2024)     tadalafil (CIALIS) 5 MG tablet Take 5 mg by mouth daily. (Patient not taking: Reported on 11/27/2024)     tamsulosin  (FLOMAX ) 0.4 MG CAPS capsule Take 1 capsule (0.4 mg total) by mouth daily. (Patient not taking: Reported on 11/27/2024) 30 capsule 1   No current facility-administered medications for this visit.   BP 107/69 (BP Location: Right Arm, Patient Position: Sitting, Cuff Size: Large)   Pulse (!) 102   Ht 5' 9 (1.753 m)   Wt 175 lb (79.4 kg)   SpO2 94%   BMI 25.84 kg/m   PHYSICAL EXAM:  BP 107/69 (BP Location: Right Arm, Patient Position: Sitting, Cuff Size: Large)   Pulse (!) 102   Ht 5' 9 (1.753 m)   Wt 175 lb (79.4 kg)   SpO2 94%   BMI 25.84 kg/m    Salient findings:  CN II-XII intact Bilateral EAC clear and TM intact with well pneumatized middle ear spaces Nose: Anterior rhinoscopy reveals right septal deviation, L>R inferior turbinate hypertrophy; I am able to see what looks almost like a tiny pustule (almost as if it is a whitehead) at the left side of the columella around hair bearing area, mild honey colored crusting; no other lesions noted.  Nasal endoscopy was indicated to  better evaluate the nose and paranasal sinuses, given the patient's history and exam findings, and is detailed below. No lesions of oral  cavity/oropharynx No obviously palpable neck masses/lymphadenopathy/thyromegaly No respiratory distress or stridor   PROCEDURE:  Prior to initiating any procedures, risks/benefits/alternatives were explained to the patient and verbal consent obtained. Diagnostic Nasal Endoscopy Pre-procedure diagnosis: Rhinorrhea, post nasal drip Post-procedure diagnosis: same Indication: See pre-procedure diagnosis and physical exam above Complications: None apparent EBL: 0 mL Anesthesia: Lidocaine  4% and topical decongestant was topically sprayed in each nasal cavity  Description of Procedure:  Patient was identified. A rigid 30 degree endoscope was utilized to evaluate the sinonasal cavities, mucosa, sinus ostia and turbinates and septum.  Overall, signs of mucosal inflammation are mild but noted.  No mucopurulence, polyps, or masses noted.   Right Middle meatus: clear Right SE Recess: clear Left MM: clear Left SE Recess: clear Mild mucoid secretions along nasal floor and mild dryness  CPT CODE -- 31231 - Mod 25   ASSESSMENT:  59 y.o. with:  1. Nasal lesion   2. Post-nasal drip   3. Chronic rhinitis   4. Nasal dryness   5. Nasal septal deviation   6. Other allergic rhinitis    Nasal dryness (likely due to CPAP) with recurrent nasal lesion - Chronic nasal dryness with recurrent sore, likely due to local trauma and environmental factors and wiping with dryness. We discussed DDX and will start with conservative course of management - Prescribed mupirocin  ointment intranasally twice daily for 14 days. - Recommended AYR (saline) gel for nasal humidification, especially before sleep. - Advised to avoid trimming nasal hairs for six weeks until healed. - Discussed use of Ponaris oil at night and as needed for lubrication. - Instructed to minimize trauma to nasal vestibule and avoid excessive wiping. - Provided printed instructions and advised to call if symptoms persist or worsen.  Allergic  rhinitis with chronic nasal drainage (A/P) Allergic rhinitis with chronic nasal drainage and intermittent sinus infections which resolve with abx; recent CT without significant disease. Nasal mucosa dryness likely exacerbated by environmental factors and CPAP use. - Recommended continuation of current allergy management regimen, including prescription nasal sprays and oral antihistamines (levocetirizine). - Recommended use of saline gel and/or nasal oil to address dryness and reduce compensatory nasal drainage.  He will try this, and if no improvement, will call us  back  See below regarding exact medications prescribed this encounter including dosages and route: Meds ordered this encounter  Medications   mupirocin  ointment (BACTROBAN ) 2 %    Sig: Apply 1 Application topically 2 (two) times daily for 14 days.    Dispense:  22 g    Refill:  0   saline (AYR) GEL    Sig: Place 1 Application into both nostrils every 4 (four) hours.    Dispense:  14 g    Refill:  10     Thank you for allowing me the opportunity to care for your patient. Please do not hesitate to contact me should you have any other questions.  Sincerely, Eldora Blanch, MD Otolaryngologist (ENT), [facility] Phone: [phone] Fax: [phone]  MDM:  [phone] Complexity/Problems addressed: mod - multiple chronic prob Data complexity: mod - independent interpretation of imaging; review of notes, labs - Morbidity: mod  - Drug prescribed or managed: y  11/30/2024, 7:17 PM      [1]  Allergies Allergen Reactions   Dulaglutide Other (See Comments)    Trulicity- Caused pancreatitis    Monosodium  Glutamate Diarrhea    MSG   Ciprofloxacin  Other (See Comments)    GI upset

## 2024-11-27 NOTE — Patient Instructions (Signed)
 Mupirocin  ointment: Apply a pea sized amount twice daily just to inside of each nostril using your pinkie finger for 14 days (apply to the nostril part, not the middle part), then pinch your nose for 10 seconds, then stop  Ayr gel: Apply a pea sized amount up to 4 times daily just to inside of each nostril like above, then pinch your nose for 10 seconds. Use this consistently.  You can use baby oil or ponaris oil in the nose 2-3 times per day (especially at night)

## 2024-12-20 ENCOUNTER — Ambulatory Visit: Admit: 2024-12-20 | Admitting: Internal Medicine
